# Patient Record
Sex: Female | Born: 1935 | Race: White | Hispanic: No | Smoking: Never smoker
Health system: Southern US, Community
[De-identification: ages and names within clinical notes are randomized; demographics above are authoritative.]

## PROBLEM LIST (undated history)

## (undated) DIAGNOSIS — S069X9A Unspecified intracranial injury with loss of consciousness of unspecified duration, initial encounter: Secondary | ICD-10-CM

## (undated) DIAGNOSIS — I10 Essential (primary) hypertension: Secondary | ICD-10-CM

## (undated) DIAGNOSIS — Z931 Gastrostomy status: Secondary | ICD-10-CM

## (undated) DIAGNOSIS — R601 Generalized edema: Secondary | ICD-10-CM

## (undated) DIAGNOSIS — F419 Anxiety disorder, unspecified: Secondary | ICD-10-CM

## (undated) DIAGNOSIS — R1310 Dysphagia, unspecified: Secondary | ICD-10-CM

## (undated) DIAGNOSIS — E669 Obesity, unspecified: Secondary | ICD-10-CM

## (undated) DIAGNOSIS — M109 Gout, unspecified: Secondary | ICD-10-CM

## (undated) HISTORY — DX: Gastrostomy status: Z93.1

## (undated) HISTORY — DX: Essential (primary) hypertension: I10

## (undated) HISTORY — DX: Anxiety disorder, unspecified: F41.9

## (undated) HISTORY — DX: Dysphagia, unspecified: R13.10

## (undated) HISTORY — DX: Gout, unspecified: M10.9

## (undated) HISTORY — DX: Unspecified intracranial injury with loss of consciousness of unspecified duration, initial encounter: S06.9X9A

## (undated) HISTORY — DX: Obesity, unspecified: E66.9

## (undated) HISTORY — PX: MASTECTOMY: SHX3

## (undated) HISTORY — DX: Generalized edema: R60.1

---

## 2013-08-05 LAB — HEPATIC FUNCTION PANEL
ALT: 9 U/L (ref 7–35)
AST: 16 U/L (ref 13–35)

## 2013-08-06 LAB — TSH: TSH: 1.68 u[IU]/mL (ref 0.41–5.90)

## 2015-02-09 DIAGNOSIS — I10 Essential (primary) hypertension: Secondary | ICD-10-CM | POA: Diagnosis not present

## 2015-02-09 DIAGNOSIS — L309 Dermatitis, unspecified: Secondary | ICD-10-CM | POA: Diagnosis not present

## 2015-02-09 DIAGNOSIS — E790 Hyperuricemia without signs of inflammatory arthritis and tophaceous disease: Secondary | ICD-10-CM | POA: Diagnosis not present

## 2015-03-13 DIAGNOSIS — Z0000 Encounter for general adult medical examination without abnormal findings: Secondary | ICD-10-CM | POA: Diagnosis not present

## 2015-03-13 DIAGNOSIS — R21 Rash and other nonspecific skin eruption: Secondary | ICD-10-CM | POA: Diagnosis not present

## 2015-03-13 DIAGNOSIS — E559 Vitamin D deficiency, unspecified: Secondary | ICD-10-CM | POA: Diagnosis not present

## 2015-03-13 DIAGNOSIS — I10 Essential (primary) hypertension: Secondary | ICD-10-CM | POA: Diagnosis not present

## 2015-03-13 DIAGNOSIS — E782 Mixed hyperlipidemia: Secondary | ICD-10-CM | POA: Diagnosis not present

## 2015-04-04 DIAGNOSIS — E559 Vitamin D deficiency, unspecified: Secondary | ICD-10-CM | POA: Diagnosis not present

## 2015-04-04 DIAGNOSIS — G8929 Other chronic pain: Secondary | ICD-10-CM | POA: Diagnosis not present

## 2015-04-04 DIAGNOSIS — M25561 Pain in right knee: Secondary | ICD-10-CM | POA: Diagnosis not present

## 2015-04-04 DIAGNOSIS — M25562 Pain in left knee: Secondary | ICD-10-CM | POA: Diagnosis not present

## 2015-04-24 DIAGNOSIS — L03115 Cellulitis of right lower limb: Secondary | ICD-10-CM | POA: Diagnosis not present

## 2015-04-24 DIAGNOSIS — I872 Venous insufficiency (chronic) (peripheral): Secondary | ICD-10-CM | POA: Diagnosis present

## 2015-04-24 DIAGNOSIS — M109 Gout, unspecified: Secondary | ICD-10-CM | POA: Diagnosis present

## 2015-04-24 DIAGNOSIS — R509 Fever, unspecified: Secondary | ICD-10-CM | POA: Diagnosis not present

## 2015-04-24 DIAGNOSIS — R442 Other hallucinations: Secondary | ICD-10-CM | POA: Diagnosis not present

## 2015-04-24 DIAGNOSIS — R443 Hallucinations, unspecified: Secondary | ICD-10-CM | POA: Diagnosis not present

## 2015-04-24 DIAGNOSIS — M170 Bilateral primary osteoarthritis of knee: Secondary | ICD-10-CM | POA: Diagnosis present

## 2015-04-24 DIAGNOSIS — E860 Dehydration: Secondary | ICD-10-CM | POA: Diagnosis present

## 2015-04-24 DIAGNOSIS — L03116 Cellulitis of left lower limb: Secondary | ICD-10-CM | POA: Diagnosis not present

## 2015-04-24 DIAGNOSIS — Z6837 Body mass index (BMI) 37.0-37.9, adult: Secondary | ICD-10-CM | POA: Diagnosis not present

## 2015-04-24 DIAGNOSIS — D649 Anemia, unspecified: Secondary | ICD-10-CM | POA: Diagnosis present

## 2015-04-24 DIAGNOSIS — R4182 Altered mental status, unspecified: Secondary | ICD-10-CM | POA: Diagnosis not present

## 2015-04-24 DIAGNOSIS — F29 Unspecified psychosis not due to a substance or known physiological condition: Secondary | ICD-10-CM | POA: Diagnosis not present

## 2015-04-24 DIAGNOSIS — Z87891 Personal history of nicotine dependence: Secondary | ICD-10-CM | POA: Diagnosis not present

## 2015-04-24 DIAGNOSIS — E869 Volume depletion, unspecified: Secondary | ICD-10-CM | POA: Diagnosis present

## 2015-04-24 DIAGNOSIS — E538 Deficiency of other specified B group vitamins: Secondary | ICD-10-CM | POA: Diagnosis present

## 2015-04-24 DIAGNOSIS — R51 Headache: Secondary | ICD-10-CM | POA: Diagnosis not present

## 2015-04-24 DIAGNOSIS — L03119 Cellulitis of unspecified part of limb: Secondary | ICD-10-CM | POA: Diagnosis present

## 2015-04-24 DIAGNOSIS — I10 Essential (primary) hypertension: Secondary | ICD-10-CM | POA: Diagnosis present

## 2015-04-24 DIAGNOSIS — E46 Unspecified protein-calorie malnutrition: Secondary | ICD-10-CM | POA: Diagnosis present

## 2015-04-24 DIAGNOSIS — G9341 Metabolic encephalopathy: Secondary | ICD-10-CM | POA: Diagnosis present

## 2015-04-24 DIAGNOSIS — F23 Brief psychotic disorder: Secondary | ICD-10-CM | POA: Diagnosis present

## 2015-04-24 DIAGNOSIS — I771 Stricture of artery: Secondary | ICD-10-CM | POA: Diagnosis not present

## 2015-05-01 DIAGNOSIS — G9341 Metabolic encephalopathy: Secondary | ICD-10-CM | POA: Diagnosis not present

## 2015-05-01 DIAGNOSIS — D649 Anemia, unspecified: Secondary | ICD-10-CM | POA: Diagnosis not present

## 2015-05-01 DIAGNOSIS — I8310 Varicose veins of unspecified lower extremity with inflammation: Secondary | ICD-10-CM | POA: Diagnosis not present

## 2015-05-01 DIAGNOSIS — E869 Volume depletion, unspecified: Secondary | ICD-10-CM | POA: Diagnosis not present

## 2015-05-01 DIAGNOSIS — I8312 Varicose veins of left lower extremity with inflammation: Secondary | ICD-10-CM | POA: Diagnosis not present

## 2015-05-01 DIAGNOSIS — I959 Hypotension, unspecified: Secondary | ICD-10-CM | POA: Diagnosis not present

## 2015-05-01 DIAGNOSIS — E538 Deficiency of other specified B group vitamins: Secondary | ICD-10-CM | POA: Diagnosis present

## 2015-05-01 DIAGNOSIS — L03116 Cellulitis of left lower limb: Secondary | ICD-10-CM | POA: Diagnosis not present

## 2015-05-01 DIAGNOSIS — E46 Unspecified protein-calorie malnutrition: Secondary | ICD-10-CM | POA: Diagnosis present

## 2015-05-01 DIAGNOSIS — S20211A Contusion of right front wall of thorax, initial encounter: Secondary | ICD-10-CM | POA: Diagnosis present

## 2015-05-01 DIAGNOSIS — R9082 White matter disease, unspecified: Secondary | ICD-10-CM | POA: Diagnosis not present

## 2015-05-01 DIAGNOSIS — R4182 Altered mental status, unspecified: Secondary | ICD-10-CM | POA: Diagnosis not present

## 2015-05-01 DIAGNOSIS — J9811 Atelectasis: Secondary | ICD-10-CM | POA: Diagnosis not present

## 2015-05-01 DIAGNOSIS — F05 Delirium due to known physiological condition: Secondary | ICD-10-CM | POA: Diagnosis present

## 2015-05-01 DIAGNOSIS — Z7401 Bed confinement status: Secondary | ICD-10-CM | POA: Diagnosis not present

## 2015-05-01 DIAGNOSIS — F0281 Dementia in other diseases classified elsewhere with behavioral disturbance: Secondary | ICD-10-CM | POA: Diagnosis not present

## 2015-05-01 DIAGNOSIS — G308 Other Alzheimer's disease: Secondary | ICD-10-CM | POA: Diagnosis not present

## 2015-05-01 DIAGNOSIS — S299XXS Unspecified injury of thorax, sequela: Secondary | ICD-10-CM | POA: Diagnosis not present

## 2015-05-01 DIAGNOSIS — E559 Vitamin D deficiency, unspecified: Secondary | ICD-10-CM | POA: Diagnosis present

## 2015-05-01 DIAGNOSIS — I8311 Varicose veins of right lower extremity with inflammation: Secondary | ICD-10-CM | POA: Diagnosis not present

## 2015-05-01 DIAGNOSIS — R5381 Other malaise: Secondary | ICD-10-CM | POA: Diagnosis present

## 2015-05-01 DIAGNOSIS — D500 Iron deficiency anemia secondary to blood loss (chronic): Secondary | ICD-10-CM | POA: Diagnosis not present

## 2015-05-01 DIAGNOSIS — Z888 Allergy status to other drugs, medicaments and biological substances status: Secondary | ICD-10-CM | POA: Diagnosis not present

## 2015-05-01 DIAGNOSIS — Z87891 Personal history of nicotine dependence: Secondary | ICD-10-CM | POA: Diagnosis not present

## 2015-05-01 DIAGNOSIS — F29 Unspecified psychosis not due to a substance or known physiological condition: Secondary | ICD-10-CM | POA: Diagnosis not present

## 2015-05-01 DIAGNOSIS — Z9181 History of falling: Secondary | ICD-10-CM | POA: Diagnosis not present

## 2015-05-01 DIAGNOSIS — F0391 Unspecified dementia with behavioral disturbance: Secondary | ICD-10-CM | POA: Diagnosis present

## 2015-05-01 DIAGNOSIS — F23 Brief psychotic disorder: Secondary | ICD-10-CM | POA: Diagnosis not present

## 2015-05-01 DIAGNOSIS — J90 Pleural effusion, not elsewhere classified: Secondary | ICD-10-CM | POA: Diagnosis not present

## 2015-05-01 DIAGNOSIS — G319 Degenerative disease of nervous system, unspecified: Secondary | ICD-10-CM | POA: Diagnosis not present

## 2015-05-01 DIAGNOSIS — S299XXA Unspecified injury of thorax, initial encounter: Secondary | ICD-10-CM | POA: Diagnosis not present

## 2015-05-01 DIAGNOSIS — D539 Nutritional anemia, unspecified: Secondary | ICD-10-CM | POA: Diagnosis present

## 2015-05-01 DIAGNOSIS — I872 Venous insufficiency (chronic) (peripheral): Secondary | ICD-10-CM | POA: Diagnosis present

## 2015-05-03 DIAGNOSIS — I10 Essential (primary) hypertension: Secondary | ICD-10-CM | POA: Diagnosis present

## 2015-05-03 DIAGNOSIS — F0391 Unspecified dementia with behavioral disturbance: Secondary | ICD-10-CM | POA: Diagnosis present

## 2015-05-03 DIAGNOSIS — E559 Vitamin D deficiency, unspecified: Secondary | ICD-10-CM | POA: Diagnosis present

## 2015-05-03 DIAGNOSIS — M109 Gout, unspecified: Secondary | ICD-10-CM | POA: Diagnosis present

## 2015-05-03 DIAGNOSIS — D62 Acute posthemorrhagic anemia: Secondary | ICD-10-CM | POA: Diagnosis present

## 2015-05-03 DIAGNOSIS — Z9011 Acquired absence of right breast and nipple: Secondary | ICD-10-CM | POA: Diagnosis not present

## 2015-05-03 DIAGNOSIS — E538 Deficiency of other specified B group vitamins: Secondary | ICD-10-CM | POA: Diagnosis present

## 2015-05-03 DIAGNOSIS — Z87891 Personal history of nicotine dependence: Secondary | ICD-10-CM | POA: Diagnosis not present

## 2015-05-03 DIAGNOSIS — Z79899 Other long term (current) drug therapy: Secondary | ICD-10-CM | POA: Diagnosis not present

## 2015-05-03 DIAGNOSIS — F23 Brief psychotic disorder: Secondary | ICD-10-CM | POA: Diagnosis present

## 2015-05-03 DIAGNOSIS — Z885 Allergy status to narcotic agent status: Secondary | ICD-10-CM | POA: Diagnosis not present

## 2015-05-03 DIAGNOSIS — I9589 Other hypotension: Secondary | ICD-10-CM | POA: Diagnosis present

## 2015-05-03 DIAGNOSIS — M1990 Unspecified osteoarthritis, unspecified site: Secondary | ICD-10-CM | POA: Diagnosis present

## 2015-05-03 DIAGNOSIS — I872 Venous insufficiency (chronic) (peripheral): Secondary | ICD-10-CM | POA: Diagnosis present

## 2015-05-03 DIAGNOSIS — Z853 Personal history of malignant neoplasm of breast: Secondary | ICD-10-CM | POA: Diagnosis not present

## 2015-05-03 DIAGNOSIS — S20211A Contusion of right front wall of thorax, initial encounter: Secondary | ICD-10-CM | POA: Diagnosis present

## 2015-05-04 DIAGNOSIS — Z741 Need for assistance with personal care: Secondary | ICD-10-CM | POA: Diagnosis not present

## 2015-05-04 DIAGNOSIS — I10 Essential (primary) hypertension: Secondary | ICD-10-CM | POA: Diagnosis present

## 2015-05-04 DIAGNOSIS — D500 Iron deficiency anemia secondary to blood loss (chronic): Secondary | ICD-10-CM | POA: Diagnosis present

## 2015-05-04 DIAGNOSIS — Z87891 Personal history of nicotine dependence: Secondary | ICD-10-CM | POA: Diagnosis not present

## 2015-05-04 DIAGNOSIS — F0391 Unspecified dementia with behavioral disturbance: Secondary | ICD-10-CM | POA: Diagnosis not present

## 2015-05-04 DIAGNOSIS — M6281 Muscle weakness (generalized): Secondary | ICD-10-CM | POA: Diagnosis not present

## 2015-05-04 DIAGNOSIS — R41841 Cognitive communication deficit: Secondary | ICD-10-CM | POA: Diagnosis not present

## 2015-05-04 DIAGNOSIS — E538 Deficiency of other specified B group vitamins: Secondary | ICD-10-CM | POA: Diagnosis present

## 2015-05-04 DIAGNOSIS — F05 Delirium due to known physiological condition: Secondary | ICD-10-CM | POA: Diagnosis present

## 2015-05-04 DIAGNOSIS — D649 Anemia, unspecified: Secondary | ICD-10-CM | POA: Diagnosis not present

## 2015-05-04 DIAGNOSIS — D508 Other iron deficiency anemias: Secondary | ICD-10-CM | POA: Diagnosis not present

## 2015-05-04 DIAGNOSIS — Z9011 Acquired absence of right breast and nipple: Secondary | ICD-10-CM | POA: Diagnosis not present

## 2015-05-04 DIAGNOSIS — Z79899 Other long term (current) drug therapy: Secondary | ICD-10-CM | POA: Diagnosis not present

## 2015-05-04 DIAGNOSIS — S2020XA Contusion of thorax, unspecified, initial encounter: Secondary | ICD-10-CM | POA: Diagnosis not present

## 2015-05-04 DIAGNOSIS — R262 Difficulty in walking, not elsewhere classified: Secondary | ICD-10-CM | POA: Diagnosis not present

## 2015-05-04 DIAGNOSIS — N390 Urinary tract infection, site not specified: Secondary | ICD-10-CM | POA: Diagnosis present

## 2015-05-04 DIAGNOSIS — I959 Hypotension, unspecified: Secondary | ICD-10-CM | POA: Diagnosis present

## 2015-05-04 DIAGNOSIS — E46 Unspecified protein-calorie malnutrition: Secondary | ICD-10-CM | POA: Diagnosis not present

## 2015-05-04 DIAGNOSIS — T148 Other injury of unspecified body region: Secondary | ICD-10-CM | POA: Diagnosis not present

## 2015-05-04 DIAGNOSIS — S20211A Contusion of right front wall of thorax, initial encounter: Secondary | ICD-10-CM | POA: Diagnosis present

## 2015-05-04 DIAGNOSIS — E559 Vitamin D deficiency, unspecified: Secondary | ICD-10-CM | POA: Diagnosis present

## 2015-05-04 DIAGNOSIS — I872 Venous insufficiency (chronic) (peripheral): Secondary | ICD-10-CM | POA: Diagnosis not present

## 2015-05-04 DIAGNOSIS — G9341 Metabolic encephalopathy: Secondary | ICD-10-CM | POA: Diagnosis not present

## 2015-05-04 DIAGNOSIS — S20211D Contusion of right front wall of thorax, subsequent encounter: Secondary | ICD-10-CM | POA: Diagnosis not present

## 2015-05-04 DIAGNOSIS — S299XXD Unspecified injury of thorax, subsequent encounter: Secondary | ICD-10-CM | POA: Diagnosis not present

## 2015-05-04 DIAGNOSIS — F23 Brief psychotic disorder: Secondary | ICD-10-CM | POA: Diagnosis present

## 2015-05-04 DIAGNOSIS — S298XXA Other specified injuries of thorax, initial encounter: Secondary | ICD-10-CM | POA: Diagnosis not present

## 2015-05-08 DIAGNOSIS — M170 Bilateral primary osteoarthritis of knee: Secondary | ICD-10-CM | POA: Diagnosis not present

## 2015-05-08 DIAGNOSIS — D649 Anemia, unspecified: Secondary | ICD-10-CM | POA: Diagnosis not present

## 2015-05-08 DIAGNOSIS — S299XXD Unspecified injury of thorax, subsequent encounter: Secondary | ICD-10-CM | POA: Diagnosis not present

## 2015-05-08 DIAGNOSIS — M1711 Unilateral primary osteoarthritis, right knee: Secondary | ICD-10-CM | POA: Diagnosis not present

## 2015-05-08 DIAGNOSIS — F0391 Unspecified dementia with behavioral disturbance: Secondary | ICD-10-CM | POA: Diagnosis not present

## 2015-05-08 DIAGNOSIS — M1712 Unilateral primary osteoarthritis, left knee: Secondary | ICD-10-CM | POA: Diagnosis not present

## 2015-05-08 DIAGNOSIS — Z741 Need for assistance with personal care: Secondary | ICD-10-CM | POA: Diagnosis not present

## 2015-05-08 DIAGNOSIS — T148 Other injury of unspecified body region: Secondary | ICD-10-CM | POA: Diagnosis not present

## 2015-05-08 DIAGNOSIS — E538 Deficiency of other specified B group vitamins: Secondary | ICD-10-CM | POA: Diagnosis not present

## 2015-05-08 DIAGNOSIS — M25562 Pain in left knee: Secondary | ICD-10-CM | POA: Diagnosis not present

## 2015-05-08 DIAGNOSIS — M6281 Muscle weakness (generalized): Secondary | ICD-10-CM | POA: Diagnosis not present

## 2015-05-08 DIAGNOSIS — S298XXA Other specified injuries of thorax, initial encounter: Secondary | ICD-10-CM | POA: Diagnosis not present

## 2015-05-08 DIAGNOSIS — I872 Venous insufficiency (chronic) (peripheral): Secondary | ICD-10-CM | POA: Diagnosis not present

## 2015-05-08 DIAGNOSIS — R262 Difficulty in walking, not elsewhere classified: Secondary | ICD-10-CM | POA: Diagnosis not present

## 2015-05-08 DIAGNOSIS — E46 Unspecified protein-calorie malnutrition: Secondary | ICD-10-CM | POA: Diagnosis not present

## 2015-05-08 DIAGNOSIS — G9341 Metabolic encephalopathy: Secondary | ICD-10-CM | POA: Diagnosis not present

## 2015-05-08 DIAGNOSIS — S20211D Contusion of right front wall of thorax, subsequent encounter: Secondary | ICD-10-CM | POA: Diagnosis not present

## 2015-05-08 DIAGNOSIS — L03818 Cellulitis of other sites: Secondary | ICD-10-CM | POA: Diagnosis not present

## 2015-05-08 DIAGNOSIS — R41841 Cognitive communication deficit: Secondary | ICD-10-CM | POA: Diagnosis not present

## 2015-05-08 DIAGNOSIS — Z79899 Other long term (current) drug therapy: Secondary | ICD-10-CM | POA: Diagnosis not present

## 2015-05-08 DIAGNOSIS — D508 Other iron deficiency anemias: Secondary | ICD-10-CM | POA: Diagnosis not present

## 2015-05-08 DIAGNOSIS — R2231 Localized swelling, mass and lump, right upper limb: Secondary | ICD-10-CM | POA: Diagnosis not present

## 2015-05-08 DIAGNOSIS — E559 Vitamin D deficiency, unspecified: Secondary | ICD-10-CM | POA: Diagnosis not present

## 2015-05-08 DIAGNOSIS — I959 Hypotension, unspecified: Secondary | ICD-10-CM | POA: Diagnosis not present

## 2015-05-08 DIAGNOSIS — L03119 Cellulitis of unspecified part of limb: Secondary | ICD-10-CM | POA: Diagnosis not present

## 2015-05-08 DIAGNOSIS — M25561 Pain in right knee: Secondary | ICD-10-CM | POA: Diagnosis not present

## 2015-05-19 DIAGNOSIS — L03119 Cellulitis of unspecified part of limb: Secondary | ICD-10-CM | POA: Diagnosis not present

## 2015-06-14 DIAGNOSIS — M170 Bilateral primary osteoarthritis of knee: Secondary | ICD-10-CM | POA: Diagnosis not present

## 2015-06-14 DIAGNOSIS — M25561 Pain in right knee: Secondary | ICD-10-CM | POA: Diagnosis not present

## 2015-06-14 DIAGNOSIS — M25562 Pain in left knee: Secondary | ICD-10-CM | POA: Diagnosis not present

## 2015-06-28 DIAGNOSIS — L03818 Cellulitis of other sites: Secondary | ICD-10-CM | POA: Diagnosis not present

## 2015-07-19 DIAGNOSIS — M170 Bilateral primary osteoarthritis of knee: Secondary | ICD-10-CM | POA: Diagnosis not present

## 2015-09-04 DIAGNOSIS — Z87440 Personal history of urinary (tract) infections: Secondary | ICD-10-CM | POA: Diagnosis not present

## 2015-09-04 DIAGNOSIS — I10 Essential (primary) hypertension: Secondary | ICD-10-CM | POA: Diagnosis not present

## 2015-09-04 DIAGNOSIS — D649 Anemia, unspecified: Secondary | ICD-10-CM | POA: Diagnosis not present

## 2015-09-04 DIAGNOSIS — R262 Difficulty in walking, not elsewhere classified: Secondary | ICD-10-CM | POA: Diagnosis not present

## 2015-09-04 DIAGNOSIS — M6281 Muscle weakness (generalized): Secondary | ICD-10-CM | POA: Diagnosis not present

## 2015-09-04 DIAGNOSIS — I872 Venous insufficiency (chronic) (peripheral): Secondary | ICD-10-CM | POA: Diagnosis not present

## 2015-09-04 DIAGNOSIS — E46 Unspecified protein-calorie malnutrition: Secondary | ICD-10-CM | POA: Diagnosis not present

## 2015-09-04 DIAGNOSIS — F0391 Unspecified dementia with behavioral disturbance: Secondary | ICD-10-CM | POA: Diagnosis not present

## 2015-09-06 DIAGNOSIS — Z87440 Personal history of urinary (tract) infections: Secondary | ICD-10-CM | POA: Diagnosis not present

## 2015-09-06 DIAGNOSIS — D649 Anemia, unspecified: Secondary | ICD-10-CM | POA: Diagnosis not present

## 2015-09-06 DIAGNOSIS — M6281 Muscle weakness (generalized): Secondary | ICD-10-CM | POA: Diagnosis not present

## 2015-09-06 DIAGNOSIS — E46 Unspecified protein-calorie malnutrition: Secondary | ICD-10-CM | POA: Diagnosis not present

## 2015-09-06 DIAGNOSIS — F0391 Unspecified dementia with behavioral disturbance: Secondary | ICD-10-CM | POA: Diagnosis not present

## 2015-09-06 DIAGNOSIS — I872 Venous insufficiency (chronic) (peripheral): Secondary | ICD-10-CM | POA: Diagnosis not present

## 2015-09-07 DIAGNOSIS — M6281 Muscle weakness (generalized): Secondary | ICD-10-CM | POA: Diagnosis not present

## 2015-09-07 DIAGNOSIS — F0391 Unspecified dementia with behavioral disturbance: Secondary | ICD-10-CM | POA: Diagnosis not present

## 2015-09-07 DIAGNOSIS — Z87440 Personal history of urinary (tract) infections: Secondary | ICD-10-CM | POA: Diagnosis not present

## 2015-09-07 DIAGNOSIS — D649 Anemia, unspecified: Secondary | ICD-10-CM | POA: Diagnosis not present

## 2015-09-07 DIAGNOSIS — E46 Unspecified protein-calorie malnutrition: Secondary | ICD-10-CM | POA: Diagnosis not present

## 2015-09-07 DIAGNOSIS — I872 Venous insufficiency (chronic) (peripheral): Secondary | ICD-10-CM | POA: Diagnosis not present

## 2015-09-11 DIAGNOSIS — F0391 Unspecified dementia with behavioral disturbance: Secondary | ICD-10-CM | POA: Diagnosis not present

## 2015-09-11 DIAGNOSIS — D649 Anemia, unspecified: Secondary | ICD-10-CM | POA: Diagnosis not present

## 2015-09-11 DIAGNOSIS — I872 Venous insufficiency (chronic) (peripheral): Secondary | ICD-10-CM | POA: Diagnosis not present

## 2015-09-11 DIAGNOSIS — E46 Unspecified protein-calorie malnutrition: Secondary | ICD-10-CM | POA: Diagnosis not present

## 2015-09-11 DIAGNOSIS — Z87440 Personal history of urinary (tract) infections: Secondary | ICD-10-CM | POA: Diagnosis not present

## 2015-09-11 DIAGNOSIS — M6281 Muscle weakness (generalized): Secondary | ICD-10-CM | POA: Diagnosis not present

## 2015-09-12 DIAGNOSIS — F0391 Unspecified dementia with behavioral disturbance: Secondary | ICD-10-CM | POA: Diagnosis not present

## 2015-09-12 DIAGNOSIS — Z87440 Personal history of urinary (tract) infections: Secondary | ICD-10-CM | POA: Diagnosis not present

## 2015-09-12 DIAGNOSIS — M6281 Muscle weakness (generalized): Secondary | ICD-10-CM | POA: Diagnosis not present

## 2015-09-12 DIAGNOSIS — D649 Anemia, unspecified: Secondary | ICD-10-CM | POA: Diagnosis not present

## 2015-09-12 DIAGNOSIS — I872 Venous insufficiency (chronic) (peripheral): Secondary | ICD-10-CM | POA: Diagnosis not present

## 2015-09-12 DIAGNOSIS — E46 Unspecified protein-calorie malnutrition: Secondary | ICD-10-CM | POA: Diagnosis not present

## 2015-09-13 DIAGNOSIS — E46 Unspecified protein-calorie malnutrition: Secondary | ICD-10-CM | POA: Diagnosis not present

## 2015-09-13 DIAGNOSIS — M6281 Muscle weakness (generalized): Secondary | ICD-10-CM | POA: Diagnosis not present

## 2015-09-13 DIAGNOSIS — Z87440 Personal history of urinary (tract) infections: Secondary | ICD-10-CM | POA: Diagnosis not present

## 2015-09-13 DIAGNOSIS — D649 Anemia, unspecified: Secondary | ICD-10-CM | POA: Diagnosis not present

## 2015-09-13 DIAGNOSIS — F0391 Unspecified dementia with behavioral disturbance: Secondary | ICD-10-CM | POA: Diagnosis not present

## 2015-09-13 DIAGNOSIS — I872 Venous insufficiency (chronic) (peripheral): Secondary | ICD-10-CM | POA: Diagnosis not present

## 2015-09-14 DIAGNOSIS — D649 Anemia, unspecified: Secondary | ICD-10-CM | POA: Diagnosis not present

## 2015-09-14 DIAGNOSIS — Z87440 Personal history of urinary (tract) infections: Secondary | ICD-10-CM | POA: Diagnosis not present

## 2015-09-14 DIAGNOSIS — I872 Venous insufficiency (chronic) (peripheral): Secondary | ICD-10-CM | POA: Diagnosis not present

## 2015-09-14 DIAGNOSIS — F0391 Unspecified dementia with behavioral disturbance: Secondary | ICD-10-CM | POA: Diagnosis not present

## 2015-09-14 DIAGNOSIS — M6281 Muscle weakness (generalized): Secondary | ICD-10-CM | POA: Diagnosis not present

## 2015-09-14 DIAGNOSIS — E46 Unspecified protein-calorie malnutrition: Secondary | ICD-10-CM | POA: Diagnosis not present

## 2015-09-18 DIAGNOSIS — E46 Unspecified protein-calorie malnutrition: Secondary | ICD-10-CM | POA: Diagnosis not present

## 2015-09-18 DIAGNOSIS — M6281 Muscle weakness (generalized): Secondary | ICD-10-CM | POA: Diagnosis not present

## 2015-09-18 DIAGNOSIS — Z87440 Personal history of urinary (tract) infections: Secondary | ICD-10-CM | POA: Diagnosis not present

## 2015-09-18 DIAGNOSIS — F0391 Unspecified dementia with behavioral disturbance: Secondary | ICD-10-CM | POA: Diagnosis not present

## 2015-09-18 DIAGNOSIS — I872 Venous insufficiency (chronic) (peripheral): Secondary | ICD-10-CM | POA: Diagnosis not present

## 2015-09-18 DIAGNOSIS — D649 Anemia, unspecified: Secondary | ICD-10-CM | POA: Diagnosis not present

## 2015-09-20 DIAGNOSIS — F0391 Unspecified dementia with behavioral disturbance: Secondary | ICD-10-CM | POA: Diagnosis not present

## 2015-09-20 DIAGNOSIS — Z87440 Personal history of urinary (tract) infections: Secondary | ICD-10-CM | POA: Diagnosis not present

## 2015-09-20 DIAGNOSIS — M6281 Muscle weakness (generalized): Secondary | ICD-10-CM | POA: Diagnosis not present

## 2015-09-20 DIAGNOSIS — E46 Unspecified protein-calorie malnutrition: Secondary | ICD-10-CM | POA: Diagnosis not present

## 2015-09-20 DIAGNOSIS — I872 Venous insufficiency (chronic) (peripheral): Secondary | ICD-10-CM | POA: Diagnosis not present

## 2015-09-20 DIAGNOSIS — D649 Anemia, unspecified: Secondary | ICD-10-CM | POA: Diagnosis not present

## 2015-09-21 DIAGNOSIS — Z87440 Personal history of urinary (tract) infections: Secondary | ICD-10-CM | POA: Diagnosis not present

## 2015-09-21 DIAGNOSIS — E46 Unspecified protein-calorie malnutrition: Secondary | ICD-10-CM | POA: Diagnosis not present

## 2015-09-21 DIAGNOSIS — F0391 Unspecified dementia with behavioral disturbance: Secondary | ICD-10-CM | POA: Diagnosis not present

## 2015-09-21 DIAGNOSIS — M6281 Muscle weakness (generalized): Secondary | ICD-10-CM | POA: Diagnosis not present

## 2015-09-21 DIAGNOSIS — I872 Venous insufficiency (chronic) (peripheral): Secondary | ICD-10-CM | POA: Diagnosis not present

## 2015-09-21 DIAGNOSIS — D649 Anemia, unspecified: Secondary | ICD-10-CM | POA: Diagnosis not present

## 2015-09-22 DIAGNOSIS — Z87440 Personal history of urinary (tract) infections: Secondary | ICD-10-CM | POA: Diagnosis not present

## 2015-09-22 DIAGNOSIS — F0391 Unspecified dementia with behavioral disturbance: Secondary | ICD-10-CM | POA: Diagnosis not present

## 2015-09-22 DIAGNOSIS — D649 Anemia, unspecified: Secondary | ICD-10-CM | POA: Diagnosis not present

## 2015-09-22 DIAGNOSIS — M6281 Muscle weakness (generalized): Secondary | ICD-10-CM | POA: Diagnosis not present

## 2015-09-22 DIAGNOSIS — E46 Unspecified protein-calorie malnutrition: Secondary | ICD-10-CM | POA: Diagnosis not present

## 2015-09-22 DIAGNOSIS — I872 Venous insufficiency (chronic) (peripheral): Secondary | ICD-10-CM | POA: Diagnosis not present

## 2015-09-23 DIAGNOSIS — Z87440 Personal history of urinary (tract) infections: Secondary | ICD-10-CM | POA: Diagnosis not present

## 2015-09-23 DIAGNOSIS — E46 Unspecified protein-calorie malnutrition: Secondary | ICD-10-CM | POA: Diagnosis not present

## 2015-09-23 DIAGNOSIS — D649 Anemia, unspecified: Secondary | ICD-10-CM | POA: Diagnosis not present

## 2015-09-23 DIAGNOSIS — F0391 Unspecified dementia with behavioral disturbance: Secondary | ICD-10-CM | POA: Diagnosis not present

## 2015-09-23 DIAGNOSIS — M6281 Muscle weakness (generalized): Secondary | ICD-10-CM | POA: Diagnosis not present

## 2015-09-23 DIAGNOSIS — I872 Venous insufficiency (chronic) (peripheral): Secondary | ICD-10-CM | POA: Diagnosis not present

## 2015-09-25 DIAGNOSIS — F0391 Unspecified dementia with behavioral disturbance: Secondary | ICD-10-CM | POA: Diagnosis not present

## 2015-09-25 DIAGNOSIS — M6281 Muscle weakness (generalized): Secondary | ICD-10-CM | POA: Diagnosis not present

## 2015-09-25 DIAGNOSIS — E46 Unspecified protein-calorie malnutrition: Secondary | ICD-10-CM | POA: Diagnosis not present

## 2015-09-25 DIAGNOSIS — I872 Venous insufficiency (chronic) (peripheral): Secondary | ICD-10-CM | POA: Diagnosis not present

## 2015-09-25 DIAGNOSIS — D649 Anemia, unspecified: Secondary | ICD-10-CM | POA: Diagnosis not present

## 2015-09-25 DIAGNOSIS — Z87440 Personal history of urinary (tract) infections: Secondary | ICD-10-CM | POA: Diagnosis not present

## 2015-09-26 DIAGNOSIS — I872 Venous insufficiency (chronic) (peripheral): Secondary | ICD-10-CM | POA: Diagnosis not present

## 2015-09-26 DIAGNOSIS — Z87440 Personal history of urinary (tract) infections: Secondary | ICD-10-CM | POA: Diagnosis not present

## 2015-09-26 DIAGNOSIS — E46 Unspecified protein-calorie malnutrition: Secondary | ICD-10-CM | POA: Diagnosis not present

## 2015-09-26 DIAGNOSIS — M6281 Muscle weakness (generalized): Secondary | ICD-10-CM | POA: Diagnosis not present

## 2015-09-26 DIAGNOSIS — D649 Anemia, unspecified: Secondary | ICD-10-CM | POA: Diagnosis not present

## 2015-09-26 DIAGNOSIS — F0391 Unspecified dementia with behavioral disturbance: Secondary | ICD-10-CM | POA: Diagnosis not present

## 2015-09-27 DIAGNOSIS — I872 Venous insufficiency (chronic) (peripheral): Secondary | ICD-10-CM | POA: Diagnosis not present

## 2015-09-27 DIAGNOSIS — D649 Anemia, unspecified: Secondary | ICD-10-CM | POA: Diagnosis not present

## 2015-09-27 DIAGNOSIS — E46 Unspecified protein-calorie malnutrition: Secondary | ICD-10-CM | POA: Diagnosis not present

## 2015-09-27 DIAGNOSIS — F0391 Unspecified dementia with behavioral disturbance: Secondary | ICD-10-CM | POA: Diagnosis not present

## 2015-09-27 DIAGNOSIS — M6281 Muscle weakness (generalized): Secondary | ICD-10-CM | POA: Diagnosis not present

## 2015-09-27 DIAGNOSIS — Z87440 Personal history of urinary (tract) infections: Secondary | ICD-10-CM | POA: Diagnosis not present

## 2015-10-02 DIAGNOSIS — F0391 Unspecified dementia with behavioral disturbance: Secondary | ICD-10-CM | POA: Diagnosis not present

## 2015-10-02 DIAGNOSIS — M6281 Muscle weakness (generalized): Secondary | ICD-10-CM | POA: Diagnosis not present

## 2015-10-02 DIAGNOSIS — D649 Anemia, unspecified: Secondary | ICD-10-CM | POA: Diagnosis not present

## 2015-10-02 DIAGNOSIS — Z87440 Personal history of urinary (tract) infections: Secondary | ICD-10-CM | POA: Diagnosis not present

## 2015-10-02 DIAGNOSIS — I872 Venous insufficiency (chronic) (peripheral): Secondary | ICD-10-CM | POA: Diagnosis not present

## 2015-10-02 DIAGNOSIS — E46 Unspecified protein-calorie malnutrition: Secondary | ICD-10-CM | POA: Diagnosis not present

## 2015-10-03 DIAGNOSIS — D649 Anemia, unspecified: Secondary | ICD-10-CM | POA: Diagnosis not present

## 2015-10-03 DIAGNOSIS — Z87440 Personal history of urinary (tract) infections: Secondary | ICD-10-CM | POA: Diagnosis not present

## 2015-10-03 DIAGNOSIS — M6281 Muscle weakness (generalized): Secondary | ICD-10-CM | POA: Diagnosis not present

## 2015-10-03 DIAGNOSIS — F0391 Unspecified dementia with behavioral disturbance: Secondary | ICD-10-CM | POA: Diagnosis not present

## 2015-10-03 DIAGNOSIS — I872 Venous insufficiency (chronic) (peripheral): Secondary | ICD-10-CM | POA: Diagnosis not present

## 2015-10-03 DIAGNOSIS — E46 Unspecified protein-calorie malnutrition: Secondary | ICD-10-CM | POA: Diagnosis not present

## 2015-10-04 DIAGNOSIS — F0391 Unspecified dementia with behavioral disturbance: Secondary | ICD-10-CM | POA: Diagnosis not present

## 2015-10-04 DIAGNOSIS — D649 Anemia, unspecified: Secondary | ICD-10-CM | POA: Diagnosis not present

## 2015-10-04 DIAGNOSIS — T148 Other injury of unspecified body region: Secondary | ICD-10-CM | POA: Diagnosis not present

## 2015-10-04 DIAGNOSIS — E46 Unspecified protein-calorie malnutrition: Secondary | ICD-10-CM | POA: Diagnosis not present

## 2015-10-04 DIAGNOSIS — I10 Essential (primary) hypertension: Secondary | ICD-10-CM | POA: Diagnosis not present

## 2015-10-04 DIAGNOSIS — Z87440 Personal history of urinary (tract) infections: Secondary | ICD-10-CM | POA: Diagnosis not present

## 2015-10-04 DIAGNOSIS — I872 Venous insufficiency (chronic) (peripheral): Secondary | ICD-10-CM | POA: Diagnosis not present

## 2015-10-04 DIAGNOSIS — M6281 Muscle weakness (generalized): Secondary | ICD-10-CM | POA: Diagnosis not present

## 2015-10-04 DIAGNOSIS — M1990 Unspecified osteoarthritis, unspecified site: Secondary | ICD-10-CM | POA: Diagnosis not present

## 2015-10-05 DIAGNOSIS — E46 Unspecified protein-calorie malnutrition: Secondary | ICD-10-CM | POA: Diagnosis not present

## 2015-10-05 DIAGNOSIS — M6281 Muscle weakness (generalized): Secondary | ICD-10-CM | POA: Diagnosis not present

## 2015-10-05 DIAGNOSIS — Z87440 Personal history of urinary (tract) infections: Secondary | ICD-10-CM | POA: Diagnosis not present

## 2015-10-05 DIAGNOSIS — D649 Anemia, unspecified: Secondary | ICD-10-CM | POA: Diagnosis not present

## 2015-10-05 DIAGNOSIS — F0391 Unspecified dementia with behavioral disturbance: Secondary | ICD-10-CM | POA: Diagnosis not present

## 2015-10-05 DIAGNOSIS — I872 Venous insufficiency (chronic) (peripheral): Secondary | ICD-10-CM | POA: Diagnosis not present

## 2015-10-11 DIAGNOSIS — M6281 Muscle weakness (generalized): Secondary | ICD-10-CM | POA: Diagnosis not present

## 2015-10-11 DIAGNOSIS — F0391 Unspecified dementia with behavioral disturbance: Secondary | ICD-10-CM | POA: Diagnosis not present

## 2015-10-11 DIAGNOSIS — E46 Unspecified protein-calorie malnutrition: Secondary | ICD-10-CM | POA: Diagnosis not present

## 2015-10-11 DIAGNOSIS — I872 Venous insufficiency (chronic) (peripheral): Secondary | ICD-10-CM | POA: Diagnosis not present

## 2015-10-11 DIAGNOSIS — Z87440 Personal history of urinary (tract) infections: Secondary | ICD-10-CM | POA: Diagnosis not present

## 2015-10-11 DIAGNOSIS — D649 Anemia, unspecified: Secondary | ICD-10-CM | POA: Diagnosis not present

## 2015-10-13 DIAGNOSIS — Z87440 Personal history of urinary (tract) infections: Secondary | ICD-10-CM | POA: Diagnosis not present

## 2015-10-13 DIAGNOSIS — M6281 Muscle weakness (generalized): Secondary | ICD-10-CM | POA: Diagnosis not present

## 2015-10-13 DIAGNOSIS — I872 Venous insufficiency (chronic) (peripheral): Secondary | ICD-10-CM | POA: Diagnosis not present

## 2015-10-13 DIAGNOSIS — E46 Unspecified protein-calorie malnutrition: Secondary | ICD-10-CM | POA: Diagnosis not present

## 2015-10-13 DIAGNOSIS — D649 Anemia, unspecified: Secondary | ICD-10-CM | POA: Diagnosis not present

## 2015-10-13 DIAGNOSIS — F0391 Unspecified dementia with behavioral disturbance: Secondary | ICD-10-CM | POA: Diagnosis not present

## 2015-10-17 DIAGNOSIS — E46 Unspecified protein-calorie malnutrition: Secondary | ICD-10-CM | POA: Diagnosis not present

## 2015-10-17 DIAGNOSIS — Z87440 Personal history of urinary (tract) infections: Secondary | ICD-10-CM | POA: Diagnosis not present

## 2015-10-17 DIAGNOSIS — I872 Venous insufficiency (chronic) (peripheral): Secondary | ICD-10-CM | POA: Diagnosis not present

## 2015-10-17 DIAGNOSIS — D649 Anemia, unspecified: Secondary | ICD-10-CM | POA: Diagnosis not present

## 2015-10-17 DIAGNOSIS — M6281 Muscle weakness (generalized): Secondary | ICD-10-CM | POA: Diagnosis not present

## 2015-10-17 DIAGNOSIS — F0391 Unspecified dementia with behavioral disturbance: Secondary | ICD-10-CM | POA: Diagnosis not present

## 2015-10-19 DIAGNOSIS — E46 Unspecified protein-calorie malnutrition: Secondary | ICD-10-CM | POA: Diagnosis not present

## 2015-10-19 DIAGNOSIS — D649 Anemia, unspecified: Secondary | ICD-10-CM | POA: Diagnosis not present

## 2015-10-19 DIAGNOSIS — F0391 Unspecified dementia with behavioral disturbance: Secondary | ICD-10-CM | POA: Diagnosis not present

## 2015-10-19 DIAGNOSIS — Z87440 Personal history of urinary (tract) infections: Secondary | ICD-10-CM | POA: Diagnosis not present

## 2015-10-19 DIAGNOSIS — I872 Venous insufficiency (chronic) (peripheral): Secondary | ICD-10-CM | POA: Diagnosis not present

## 2015-10-19 DIAGNOSIS — M6281 Muscle weakness (generalized): Secondary | ICD-10-CM | POA: Diagnosis not present

## 2015-10-23 DIAGNOSIS — I872 Venous insufficiency (chronic) (peripheral): Secondary | ICD-10-CM | POA: Diagnosis not present

## 2015-10-23 DIAGNOSIS — M6281 Muscle weakness (generalized): Secondary | ICD-10-CM | POA: Diagnosis not present

## 2015-10-23 DIAGNOSIS — Z87440 Personal history of urinary (tract) infections: Secondary | ICD-10-CM | POA: Diagnosis not present

## 2015-10-23 DIAGNOSIS — D649 Anemia, unspecified: Secondary | ICD-10-CM | POA: Diagnosis not present

## 2015-10-23 DIAGNOSIS — F0391 Unspecified dementia with behavioral disturbance: Secondary | ICD-10-CM | POA: Diagnosis not present

## 2015-10-23 DIAGNOSIS — E46 Unspecified protein-calorie malnutrition: Secondary | ICD-10-CM | POA: Diagnosis not present

## 2015-11-23 DIAGNOSIS — M1990 Unspecified osteoarthritis, unspecified site: Secondary | ICD-10-CM | POA: Diagnosis not present

## 2015-11-23 DIAGNOSIS — M6281 Muscle weakness (generalized): Secondary | ICD-10-CM | POA: Diagnosis not present

## 2015-11-23 DIAGNOSIS — I10 Essential (primary) hypertension: Secondary | ICD-10-CM | POA: Diagnosis not present

## 2015-11-23 DIAGNOSIS — G894 Chronic pain syndrome: Secondary | ICD-10-CM | POA: Diagnosis not present

## 2015-12-22 DIAGNOSIS — M1990 Unspecified osteoarthritis, unspecified site: Secondary | ICD-10-CM | POA: Diagnosis not present

## 2015-12-22 DIAGNOSIS — G894 Chronic pain syndrome: Secondary | ICD-10-CM | POA: Diagnosis not present

## 2015-12-22 DIAGNOSIS — I10 Essential (primary) hypertension: Secondary | ICD-10-CM | POA: Diagnosis not present

## 2015-12-22 DIAGNOSIS — M6281 Muscle weakness (generalized): Secondary | ICD-10-CM | POA: Diagnosis not present

## 2016-01-09 DIAGNOSIS — M170 Bilateral primary osteoarthritis of knee: Secondary | ICD-10-CM | POA: Diagnosis not present

## 2016-01-09 DIAGNOSIS — M6281 Muscle weakness (generalized): Secondary | ICD-10-CM | POA: Diagnosis not present

## 2016-01-09 DIAGNOSIS — G894 Chronic pain syndrome: Secondary | ICD-10-CM | POA: Diagnosis not present

## 2016-01-09 DIAGNOSIS — I10 Essential (primary) hypertension: Secondary | ICD-10-CM | POA: Diagnosis not present

## 2016-01-09 DIAGNOSIS — G9341 Metabolic encephalopathy: Secondary | ICD-10-CM | POA: Diagnosis not present

## 2016-01-09 DIAGNOSIS — M109 Gout, unspecified: Secondary | ICD-10-CM | POA: Diagnosis not present

## 2016-01-09 DIAGNOSIS — E559 Vitamin D deficiency, unspecified: Secondary | ICD-10-CM | POA: Diagnosis not present

## 2016-01-10 DIAGNOSIS — M170 Bilateral primary osteoarthritis of knee: Secondary | ICD-10-CM | POA: Diagnosis not present

## 2016-01-10 DIAGNOSIS — G894 Chronic pain syndrome: Secondary | ICD-10-CM | POA: Diagnosis not present

## 2016-01-10 DIAGNOSIS — I10 Essential (primary) hypertension: Secondary | ICD-10-CM | POA: Diagnosis not present

## 2016-01-10 DIAGNOSIS — G9341 Metabolic encephalopathy: Secondary | ICD-10-CM | POA: Diagnosis not present

## 2016-01-10 DIAGNOSIS — M6281 Muscle weakness (generalized): Secondary | ICD-10-CM | POA: Diagnosis not present

## 2016-01-10 DIAGNOSIS — M109 Gout, unspecified: Secondary | ICD-10-CM | POA: Diagnosis not present

## 2016-01-18 DIAGNOSIS — M109 Gout, unspecified: Secondary | ICD-10-CM | POA: Diagnosis not present

## 2016-01-18 DIAGNOSIS — M6281 Muscle weakness (generalized): Secondary | ICD-10-CM | POA: Diagnosis not present

## 2016-01-18 DIAGNOSIS — G9341 Metabolic encephalopathy: Secondary | ICD-10-CM | POA: Diagnosis not present

## 2016-01-18 DIAGNOSIS — I10 Essential (primary) hypertension: Secondary | ICD-10-CM | POA: Diagnosis not present

## 2016-01-18 DIAGNOSIS — M170 Bilateral primary osteoarthritis of knee: Secondary | ICD-10-CM | POA: Diagnosis not present

## 2016-01-18 DIAGNOSIS — G894 Chronic pain syndrome: Secondary | ICD-10-CM | POA: Diagnosis not present

## 2016-01-19 DIAGNOSIS — G9341 Metabolic encephalopathy: Secondary | ICD-10-CM | POA: Diagnosis not present

## 2016-01-19 DIAGNOSIS — M6281 Muscle weakness (generalized): Secondary | ICD-10-CM | POA: Diagnosis not present

## 2016-01-19 DIAGNOSIS — M170 Bilateral primary osteoarthritis of knee: Secondary | ICD-10-CM | POA: Diagnosis not present

## 2016-01-19 DIAGNOSIS — M109 Gout, unspecified: Secondary | ICD-10-CM | POA: Diagnosis not present

## 2016-01-19 DIAGNOSIS — I10 Essential (primary) hypertension: Secondary | ICD-10-CM | POA: Diagnosis not present

## 2016-01-19 DIAGNOSIS — G894 Chronic pain syndrome: Secondary | ICD-10-CM | POA: Diagnosis not present

## 2016-01-19 DIAGNOSIS — M1990 Unspecified osteoarthritis, unspecified site: Secondary | ICD-10-CM | POA: Diagnosis not present

## 2016-01-22 DIAGNOSIS — I10 Essential (primary) hypertension: Secondary | ICD-10-CM | POA: Diagnosis not present

## 2016-01-22 DIAGNOSIS — M109 Gout, unspecified: Secondary | ICD-10-CM | POA: Diagnosis not present

## 2016-01-22 DIAGNOSIS — M6281 Muscle weakness (generalized): Secondary | ICD-10-CM | POA: Diagnosis not present

## 2016-01-22 DIAGNOSIS — G9341 Metabolic encephalopathy: Secondary | ICD-10-CM | POA: Diagnosis not present

## 2016-01-22 DIAGNOSIS — G894 Chronic pain syndrome: Secondary | ICD-10-CM | POA: Diagnosis not present

## 2016-01-22 DIAGNOSIS — M170 Bilateral primary osteoarthritis of knee: Secondary | ICD-10-CM | POA: Diagnosis not present

## 2016-01-24 DIAGNOSIS — M170 Bilateral primary osteoarthritis of knee: Secondary | ICD-10-CM | POA: Diagnosis not present

## 2016-01-24 DIAGNOSIS — G894 Chronic pain syndrome: Secondary | ICD-10-CM | POA: Diagnosis not present

## 2016-01-24 DIAGNOSIS — M109 Gout, unspecified: Secondary | ICD-10-CM | POA: Diagnosis not present

## 2016-01-24 DIAGNOSIS — I10 Essential (primary) hypertension: Secondary | ICD-10-CM | POA: Diagnosis not present

## 2016-01-24 DIAGNOSIS — G9341 Metabolic encephalopathy: Secondary | ICD-10-CM | POA: Diagnosis not present

## 2016-01-24 DIAGNOSIS — M6281 Muscle weakness (generalized): Secondary | ICD-10-CM | POA: Diagnosis not present

## 2016-01-25 DIAGNOSIS — I10 Essential (primary) hypertension: Secondary | ICD-10-CM | POA: Diagnosis not present

## 2016-01-25 DIAGNOSIS — M170 Bilateral primary osteoarthritis of knee: Secondary | ICD-10-CM | POA: Diagnosis not present

## 2016-01-25 DIAGNOSIS — M109 Gout, unspecified: Secondary | ICD-10-CM | POA: Diagnosis not present

## 2016-01-25 DIAGNOSIS — M6281 Muscle weakness (generalized): Secondary | ICD-10-CM | POA: Diagnosis not present

## 2016-01-25 DIAGNOSIS — G9341 Metabolic encephalopathy: Secondary | ICD-10-CM | POA: Diagnosis not present

## 2016-01-25 DIAGNOSIS — G894 Chronic pain syndrome: Secondary | ICD-10-CM | POA: Diagnosis not present

## 2016-02-01 DIAGNOSIS — G9341 Metabolic encephalopathy: Secondary | ICD-10-CM | POA: Diagnosis not present

## 2016-02-01 DIAGNOSIS — I10 Essential (primary) hypertension: Secondary | ICD-10-CM | POA: Diagnosis not present

## 2016-02-01 DIAGNOSIS — M6281 Muscle weakness (generalized): Secondary | ICD-10-CM | POA: Diagnosis not present

## 2016-02-01 DIAGNOSIS — M170 Bilateral primary osteoarthritis of knee: Secondary | ICD-10-CM | POA: Diagnosis not present

## 2016-02-01 DIAGNOSIS — M109 Gout, unspecified: Secondary | ICD-10-CM | POA: Diagnosis not present

## 2016-02-01 DIAGNOSIS — G894 Chronic pain syndrome: Secondary | ICD-10-CM | POA: Diagnosis not present

## 2016-02-02 DIAGNOSIS — M109 Gout, unspecified: Secondary | ICD-10-CM | POA: Diagnosis not present

## 2016-02-02 DIAGNOSIS — I10 Essential (primary) hypertension: Secondary | ICD-10-CM | POA: Diagnosis not present

## 2016-02-02 DIAGNOSIS — G894 Chronic pain syndrome: Secondary | ICD-10-CM | POA: Diagnosis not present

## 2016-02-02 DIAGNOSIS — M6281 Muscle weakness (generalized): Secondary | ICD-10-CM | POA: Diagnosis not present

## 2016-02-02 DIAGNOSIS — G9341 Metabolic encephalopathy: Secondary | ICD-10-CM | POA: Diagnosis not present

## 2016-02-02 DIAGNOSIS — M170 Bilateral primary osteoarthritis of knee: Secondary | ICD-10-CM | POA: Diagnosis not present

## 2016-02-03 DIAGNOSIS — G9341 Metabolic encephalopathy: Secondary | ICD-10-CM | POA: Diagnosis not present

## 2016-02-03 DIAGNOSIS — G894 Chronic pain syndrome: Secondary | ICD-10-CM | POA: Diagnosis not present

## 2016-02-03 DIAGNOSIS — I10 Essential (primary) hypertension: Secondary | ICD-10-CM | POA: Diagnosis not present

## 2016-02-03 DIAGNOSIS — M109 Gout, unspecified: Secondary | ICD-10-CM | POA: Diagnosis not present

## 2016-02-03 DIAGNOSIS — M170 Bilateral primary osteoarthritis of knee: Secondary | ICD-10-CM | POA: Diagnosis not present

## 2016-02-03 DIAGNOSIS — M6281 Muscle weakness (generalized): Secondary | ICD-10-CM | POA: Diagnosis not present

## 2016-02-06 DIAGNOSIS — I10 Essential (primary) hypertension: Secondary | ICD-10-CM | POA: Diagnosis not present

## 2016-02-06 DIAGNOSIS — M6281 Muscle weakness (generalized): Secondary | ICD-10-CM | POA: Diagnosis not present

## 2016-02-06 DIAGNOSIS — G9341 Metabolic encephalopathy: Secondary | ICD-10-CM | POA: Diagnosis not present

## 2016-02-06 DIAGNOSIS — G894 Chronic pain syndrome: Secondary | ICD-10-CM | POA: Diagnosis not present

## 2016-02-06 DIAGNOSIS — M170 Bilateral primary osteoarthritis of knee: Secondary | ICD-10-CM | POA: Diagnosis not present

## 2016-02-06 DIAGNOSIS — M109 Gout, unspecified: Secondary | ICD-10-CM | POA: Diagnosis not present

## 2016-02-07 DIAGNOSIS — M6281 Muscle weakness (generalized): Secondary | ICD-10-CM | POA: Diagnosis not present

## 2016-02-07 DIAGNOSIS — M170 Bilateral primary osteoarthritis of knee: Secondary | ICD-10-CM | POA: Diagnosis not present

## 2016-02-07 DIAGNOSIS — I10 Essential (primary) hypertension: Secondary | ICD-10-CM | POA: Diagnosis not present

## 2016-02-07 DIAGNOSIS — M109 Gout, unspecified: Secondary | ICD-10-CM | POA: Diagnosis not present

## 2016-02-07 DIAGNOSIS — G894 Chronic pain syndrome: Secondary | ICD-10-CM | POA: Diagnosis not present

## 2016-02-07 DIAGNOSIS — G9341 Metabolic encephalopathy: Secondary | ICD-10-CM | POA: Diagnosis not present

## 2016-02-09 DIAGNOSIS — I10 Essential (primary) hypertension: Secondary | ICD-10-CM | POA: Diagnosis not present

## 2016-02-09 DIAGNOSIS — G9341 Metabolic encephalopathy: Secondary | ICD-10-CM | POA: Diagnosis not present

## 2016-02-09 DIAGNOSIS — M170 Bilateral primary osteoarthritis of knee: Secondary | ICD-10-CM | POA: Diagnosis not present

## 2016-02-09 DIAGNOSIS — M6281 Muscle weakness (generalized): Secondary | ICD-10-CM | POA: Diagnosis not present

## 2016-02-09 DIAGNOSIS — G894 Chronic pain syndrome: Secondary | ICD-10-CM | POA: Diagnosis not present

## 2016-02-09 DIAGNOSIS — M109 Gout, unspecified: Secondary | ICD-10-CM | POA: Diagnosis not present

## 2016-02-12 DIAGNOSIS — M170 Bilateral primary osteoarthritis of knee: Secondary | ICD-10-CM | POA: Diagnosis not present

## 2016-02-12 DIAGNOSIS — I10 Essential (primary) hypertension: Secondary | ICD-10-CM | POA: Diagnosis not present

## 2016-02-12 DIAGNOSIS — G894 Chronic pain syndrome: Secondary | ICD-10-CM | POA: Diagnosis not present

## 2016-02-12 DIAGNOSIS — M6281 Muscle weakness (generalized): Secondary | ICD-10-CM | POA: Diagnosis not present

## 2016-02-12 DIAGNOSIS — G9341 Metabolic encephalopathy: Secondary | ICD-10-CM | POA: Diagnosis not present

## 2016-02-12 DIAGNOSIS — M109 Gout, unspecified: Secondary | ICD-10-CM | POA: Diagnosis not present

## 2016-02-14 DIAGNOSIS — G894 Chronic pain syndrome: Secondary | ICD-10-CM | POA: Diagnosis not present

## 2016-02-14 DIAGNOSIS — M170 Bilateral primary osteoarthritis of knee: Secondary | ICD-10-CM | POA: Diagnosis not present

## 2016-02-14 DIAGNOSIS — G9341 Metabolic encephalopathy: Secondary | ICD-10-CM | POA: Diagnosis not present

## 2016-02-14 DIAGNOSIS — M6281 Muscle weakness (generalized): Secondary | ICD-10-CM | POA: Diagnosis not present

## 2016-02-14 DIAGNOSIS — M109 Gout, unspecified: Secondary | ICD-10-CM | POA: Diagnosis not present

## 2016-02-14 DIAGNOSIS — M1990 Unspecified osteoarthritis, unspecified site: Secondary | ICD-10-CM | POA: Diagnosis not present

## 2016-02-14 DIAGNOSIS — I10 Essential (primary) hypertension: Secondary | ICD-10-CM | POA: Diagnosis not present

## 2016-03-30 DIAGNOSIS — I10 Essential (primary) hypertension: Secondary | ICD-10-CM | POA: Diagnosis not present

## 2016-03-30 DIAGNOSIS — M6281 Muscle weakness (generalized): Secondary | ICD-10-CM | POA: Diagnosis not present

## 2016-03-30 DIAGNOSIS — G894 Chronic pain syndrome: Secondary | ICD-10-CM | POA: Diagnosis not present

## 2016-03-30 DIAGNOSIS — M7989 Other specified soft tissue disorders: Secondary | ICD-10-CM | POA: Diagnosis not present

## 2016-03-31 DIAGNOSIS — R9431 Abnormal electrocardiogram [ECG] [EKG]: Secondary | ICD-10-CM | POA: Diagnosis not present

## 2016-03-31 DIAGNOSIS — B9620 Unspecified Escherichia coli [E. coli] as the cause of diseases classified elsewhere: Secondary | ICD-10-CM | POA: Diagnosis present

## 2016-03-31 DIAGNOSIS — I611 Nontraumatic intracerebral hemorrhage in hemisphere, cortical: Secondary | ICD-10-CM | POA: Diagnosis not present

## 2016-03-31 DIAGNOSIS — N390 Urinary tract infection, site not specified: Secondary | ICD-10-CM | POA: Diagnosis not present

## 2016-03-31 DIAGNOSIS — R278 Other lack of coordination: Secondary | ICD-10-CM | POA: Diagnosis not present

## 2016-03-31 DIAGNOSIS — S01441A Puncture wound with foreign body of right cheek and temporomandibular area, initial encounter: Secondary | ICD-10-CM | POA: Diagnosis not present

## 2016-03-31 DIAGNOSIS — S0993XA Unspecified injury of face, initial encounter: Secondary | ICD-10-CM | POA: Diagnosis not present

## 2016-03-31 DIAGNOSIS — R52 Pain, unspecified: Secondary | ICD-10-CM | POA: Diagnosis not present

## 2016-03-31 DIAGNOSIS — R001 Bradycardia, unspecified: Secondary | ICD-10-CM | POA: Diagnosis not present

## 2016-03-31 DIAGNOSIS — L89321 Pressure ulcer of left buttock, stage 1: Secondary | ICD-10-CM | POA: Diagnosis present

## 2016-03-31 DIAGNOSIS — E876 Hypokalemia: Secondary | ICD-10-CM | POA: Diagnosis not present

## 2016-03-31 DIAGNOSIS — R488 Other symbolic dysfunctions: Secondary | ICD-10-CM | POA: Diagnosis not present

## 2016-03-31 DIAGNOSIS — R6521 Severe sepsis with septic shock: Secondary | ICD-10-CM | POA: Diagnosis not present

## 2016-03-31 DIAGNOSIS — S0181XA Laceration without foreign body of other part of head, initial encounter: Secondary | ICD-10-CM | POA: Diagnosis present

## 2016-03-31 DIAGNOSIS — G936 Cerebral edema: Secondary | ICD-10-CM | POA: Diagnosis not present

## 2016-03-31 DIAGNOSIS — I619 Nontraumatic intracerebral hemorrhage, unspecified: Secondary | ICD-10-CM | POA: Diagnosis not present

## 2016-03-31 DIAGNOSIS — M109 Gout, unspecified: Secondary | ICD-10-CM | POA: Diagnosis not present

## 2016-03-31 DIAGNOSIS — M7989 Other specified soft tissue disorders: Secondary | ICD-10-CM | POA: Diagnosis not present

## 2016-03-31 DIAGNOSIS — W19XXXA Unspecified fall, initial encounter: Secondary | ICD-10-CM | POA: Diagnosis not present

## 2016-03-31 DIAGNOSIS — I9589 Other hypotension: Secondary | ICD-10-CM | POA: Diagnosis not present

## 2016-03-31 DIAGNOSIS — S06341A Traumatic hemorrhage of right cerebrum with loss of consciousness of 30 minutes or less, initial encounter: Secondary | ICD-10-CM | POA: Diagnosis not present

## 2016-03-31 DIAGNOSIS — I615 Nontraumatic intracerebral hemorrhage, intraventricular: Secondary | ICD-10-CM | POA: Diagnosis not present

## 2016-03-31 DIAGNOSIS — R402132 Coma scale, eyes open, to sound, at arrival to emergency department: Secondary | ICD-10-CM | POA: Diagnosis present

## 2016-03-31 DIAGNOSIS — J690 Pneumonitis due to inhalation of food and vomit: Secondary | ICD-10-CM | POA: Diagnosis not present

## 2016-03-31 DIAGNOSIS — L89311 Pressure ulcer of right buttock, stage 1: Secondary | ICD-10-CM | POA: Diagnosis present

## 2016-03-31 DIAGNOSIS — R600 Localized edema: Secondary | ICD-10-CM | POA: Diagnosis not present

## 2016-03-31 DIAGNOSIS — M6281 Muscle weakness (generalized): Secondary | ICD-10-CM | POA: Diagnosis not present

## 2016-03-31 DIAGNOSIS — Z431 Encounter for attention to gastrostomy: Secondary | ICD-10-CM | POA: Diagnosis not present

## 2016-03-31 DIAGNOSIS — R579 Shock, unspecified: Secondary | ICD-10-CM | POA: Diagnosis not present

## 2016-03-31 DIAGNOSIS — I618 Other nontraumatic intracerebral hemorrhage: Secondary | ICD-10-CM | POA: Diagnosis not present

## 2016-03-31 DIAGNOSIS — J9601 Acute respiratory failure with hypoxia: Secondary | ICD-10-CM | POA: Diagnosis not present

## 2016-03-31 DIAGNOSIS — R4182 Altered mental status, unspecified: Secondary | ICD-10-CM | POA: Diagnosis not present

## 2016-03-31 DIAGNOSIS — S098XXA Other specified injuries of head, initial encounter: Secondary | ICD-10-CM | POA: Diagnosis not present

## 2016-03-31 DIAGNOSIS — R402242 Coma scale, best verbal response, confused conversation, at arrival to emergency department: Secondary | ICD-10-CM | POA: Diagnosis present

## 2016-03-31 DIAGNOSIS — Z452 Encounter for adjustment and management of vascular access device: Secondary | ICD-10-CM | POA: Diagnosis not present

## 2016-03-31 DIAGNOSIS — R1310 Dysphagia, unspecified: Secondary | ICD-10-CM | POA: Diagnosis not present

## 2016-03-31 DIAGNOSIS — Z1612 Extended spectrum beta lactamase (ESBL) resistance: Secondary | ICD-10-CM | POA: Diagnosis present

## 2016-03-31 DIAGNOSIS — A419 Sepsis, unspecified organism: Secondary | ICD-10-CM | POA: Diagnosis not present

## 2016-03-31 DIAGNOSIS — Z01818 Encounter for other preprocedural examination: Secondary | ICD-10-CM | POA: Diagnosis not present

## 2016-03-31 DIAGNOSIS — S06349A Traumatic hemorrhage of right cerebrum with loss of consciousness of unspecified duration, initial encounter: Secondary | ICD-10-CM | POA: Diagnosis not present

## 2016-03-31 DIAGNOSIS — Z7409 Other reduced mobility: Secondary | ICD-10-CM | POA: Diagnosis not present

## 2016-03-31 DIAGNOSIS — S06340A Traumatic hemorrhage of right cerebrum without loss of consciousness, initial encounter: Secondary | ICD-10-CM | POA: Diagnosis not present

## 2016-03-31 DIAGNOSIS — R739 Hyperglycemia, unspecified: Secondary | ICD-10-CM | POA: Diagnosis not present

## 2016-03-31 DIAGNOSIS — S06349D Traumatic hemorrhage of right cerebrum with loss of consciousness of unspecified duration, subsequent encounter: Secondary | ICD-10-CM | POA: Diagnosis not present

## 2016-03-31 DIAGNOSIS — Z993 Dependence on wheelchair: Secondary | ICD-10-CM | POA: Diagnosis not present

## 2016-03-31 DIAGNOSIS — E870 Hyperosmolality and hypernatremia: Secondary | ICD-10-CM | POA: Diagnosis not present

## 2016-03-31 DIAGNOSIS — N179 Acute kidney failure, unspecified: Secondary | ICD-10-CM | POA: Diagnosis not present

## 2016-03-31 DIAGNOSIS — R1312 Dysphagia, oropharyngeal phase: Secondary | ICD-10-CM | POA: Diagnosis not present

## 2016-03-31 DIAGNOSIS — Z781 Physical restraint status: Secondary | ICD-10-CM | POA: Diagnosis not present

## 2016-03-31 DIAGNOSIS — S01431A Puncture wound without foreign body of right cheek and temporomandibular area, initial encounter: Secondary | ICD-10-CM | POA: Diagnosis not present

## 2016-03-31 DIAGNOSIS — I493 Ventricular premature depolarization: Secondary | ICD-10-CM | POA: Diagnosis not present

## 2016-03-31 DIAGNOSIS — I959 Hypotension, unspecified: Secondary | ICD-10-CM | POA: Diagnosis not present

## 2016-03-31 DIAGNOSIS — Z6834 Body mass index (BMI) 34.0-34.9, adult: Secondary | ICD-10-CM | POA: Diagnosis not present

## 2016-03-31 DIAGNOSIS — J189 Pneumonia, unspecified organism: Secondary | ICD-10-CM | POA: Diagnosis not present

## 2016-03-31 DIAGNOSIS — Z430 Encounter for attention to tracheostomy: Secondary | ICD-10-CM | POA: Diagnosis not present

## 2016-03-31 DIAGNOSIS — M1990 Unspecified osteoarthritis, unspecified site: Secondary | ICD-10-CM | POA: Diagnosis not present

## 2016-03-31 DIAGNOSIS — R570 Cardiogenic shock: Secondary | ICD-10-CM | POA: Diagnosis not present

## 2016-03-31 DIAGNOSIS — M810 Age-related osteoporosis without current pathological fracture: Secondary | ICD-10-CM | POA: Diagnosis not present

## 2016-03-31 DIAGNOSIS — N3000 Acute cystitis without hematuria: Secondary | ICD-10-CM | POA: Diagnosis present

## 2016-03-31 DIAGNOSIS — R402362 Coma scale, best motor response, obeys commands, at arrival to emergency department: Secondary | ICD-10-CM | POA: Diagnosis present

## 2016-03-31 DIAGNOSIS — I10 Essential (primary) hypertension: Secondary | ICD-10-CM | POA: Diagnosis present

## 2016-03-31 DIAGNOSIS — R918 Other nonspecific abnormal finding of lung field: Secondary | ICD-10-CM | POA: Diagnosis not present

## 2016-03-31 DIAGNOSIS — R404 Transient alteration of awareness: Secondary | ICD-10-CM | POA: Diagnosis not present

## 2016-03-31 DIAGNOSIS — G8324 Monoplegia of upper limb affecting left nondominant side: Secondary | ICD-10-CM | POA: Diagnosis not present

## 2016-03-31 DIAGNOSIS — Z9181 History of falling: Secondary | ICD-10-CM | POA: Diagnosis not present

## 2016-03-31 DIAGNOSIS — L89312 Pressure ulcer of right buttock, stage 2: Secondary | ICD-10-CM | POA: Diagnosis not present

## 2016-03-31 DIAGNOSIS — G9340 Encephalopathy, unspecified: Secondary | ICD-10-CM | POA: Diagnosis not present

## 2016-03-31 DIAGNOSIS — E669 Obesity, unspecified: Secondary | ICD-10-CM | POA: Diagnosis present

## 2016-03-31 DIAGNOSIS — R410 Disorientation, unspecified: Secondary | ICD-10-CM | POA: Diagnosis not present

## 2016-03-31 LAB — CBC AND DIFFERENTIAL
HCT: 39 % (ref 36–46)
HCT: 39 % (ref 36–46)
HEMOGLOBIN: 13.2 g/dL (ref 12.0–16.0)
Hemoglobin: 13.2 g/dL (ref 12.0–16.0)
PLATELETS: 185 10*3/uL (ref 150–399)
Platelets: 185 10*3/uL (ref 150–399)
WBC: 15 10*3/mL
WBC: 15 10^3/mL

## 2016-03-31 LAB — BASIC METABOLIC PANEL
BUN: 17 mg/dL (ref 4–21)
BUN: 4 mg/dL (ref 4–21)
CREATININE: 0.7 mg/dL (ref 0.5–1.1)
Creatinine: 0.7 mg/dL (ref 0.5–1.1)
POTASSIUM: 3.7 mmol/L (ref 3.4–5.3)
POTASSIUM: 3.7 mmol/L (ref 3.4–5.3)
SODIUM: 142 mmol/L (ref 137–147)
SODIUM: 142 mmol/L (ref 137–147)

## 2016-03-31 LAB — POCT INR: INR: 1.1 (ref 0.9–1.1)

## 2016-04-03 LAB — HEPATIC FUNCTION PANEL: Bilirubin, Total: 0.6 mg/dL

## 2016-04-10 LAB — BASIC METABOLIC PANEL
Creatinine: 0.6 mg/dL (ref 0.5–1.1)
Potassium: 3.7 mmol/L (ref 3.4–5.3)

## 2016-04-10 LAB — CBC AND DIFFERENTIAL
HEMATOCRIT: 35 % — AB (ref 36–46)
HEMOGLOBIN: 11.4 g/dL — AB (ref 12.0–16.0)
PLATELETS: 210 10*3/uL (ref 150–399)
WBC: 14 10*3/mL

## 2016-04-12 LAB — CBC AND DIFFERENTIAL
HEMOGLOBIN: 10.1 g/dL — AB (ref 12.0–16.0)
Platelets: 156 10*3/uL (ref 150–399)
WBC: 12.5 10^3/mL

## 2016-04-12 LAB — BASIC METABOLIC PANEL
BUN: 20 mg/dL (ref 4–21)
Creatinine: 0.5 mg/dL (ref 0.5–1.1)
Potassium: 4 mmol/L (ref 3.4–5.3)
Sodium: 139 mmol/L (ref 137–147)

## 2016-04-15 DIAGNOSIS — S069X6D Unspecified intracranial injury with loss of consciousness greater than 24 hours without return to pre-existing conscious level with patient surviving, subsequent encounter: Secondary | ICD-10-CM | POA: Diagnosis not present

## 2016-04-15 DIAGNOSIS — Z1612 Extended spectrum beta lactamase (ESBL) resistance: Secondary | ICD-10-CM | POA: Diagnosis not present

## 2016-04-15 DIAGNOSIS — R1310 Dysphagia, unspecified: Secondary | ICD-10-CM | POA: Diagnosis not present

## 2016-04-15 DIAGNOSIS — G903 Multi-system degeneration of the autonomic nervous system: Secondary | ICD-10-CM | POA: Diagnosis not present

## 2016-04-15 DIAGNOSIS — A498 Other bacterial infections of unspecified site: Secondary | ICD-10-CM | POA: Diagnosis not present

## 2016-04-15 DIAGNOSIS — I10 Essential (primary) hypertension: Secondary | ICD-10-CM | POA: Diagnosis not present

## 2016-04-15 DIAGNOSIS — R488 Other symbolic dysfunctions: Secondary | ICD-10-CM | POA: Diagnosis not present

## 2016-04-15 DIAGNOSIS — S062X9D Diffuse traumatic brain injury with loss of consciousness of unspecified duration, subsequent encounter: Secondary | ICD-10-CM | POA: Diagnosis not present

## 2016-04-15 DIAGNOSIS — M6281 Muscle weakness (generalized): Secondary | ICD-10-CM | POA: Diagnosis not present

## 2016-04-15 DIAGNOSIS — S06349D Traumatic hemorrhage of right cerebrum with loss of consciousness of unspecified duration, subsequent encounter: Secondary | ICD-10-CM | POA: Diagnosis not present

## 2016-04-15 DIAGNOSIS — S01401D Unspecified open wound of right cheek and temporomandibular area, subsequent encounter: Secondary | ICD-10-CM | POA: Diagnosis not present

## 2016-04-15 DIAGNOSIS — N390 Urinary tract infection, site not specified: Secondary | ICD-10-CM | POA: Diagnosis not present

## 2016-04-15 DIAGNOSIS — Z431 Encounter for attention to gastrostomy: Secondary | ICD-10-CM | POA: Diagnosis not present

## 2016-04-15 DIAGNOSIS — R278 Other lack of coordination: Secondary | ICD-10-CM | POA: Diagnosis not present

## 2016-04-15 DIAGNOSIS — S0181XS Laceration without foreign body of other part of head, sequela: Secondary | ICD-10-CM | POA: Diagnosis not present

## 2016-04-15 DIAGNOSIS — R1312 Dysphagia, oropharyngeal phase: Secondary | ICD-10-CM | POA: Diagnosis not present

## 2016-04-15 DIAGNOSIS — R601 Generalized edema: Secondary | ICD-10-CM | POA: Diagnosis not present

## 2016-04-15 DIAGNOSIS — Z931 Gastrostomy status: Secondary | ICD-10-CM | POA: Diagnosis not present

## 2016-04-15 DIAGNOSIS — M109 Gout, unspecified: Secondary | ICD-10-CM | POA: Diagnosis not present

## 2016-04-15 DIAGNOSIS — E669 Obesity, unspecified: Secondary | ICD-10-CM | POA: Diagnosis not present

## 2016-04-15 DIAGNOSIS — S21202D Unspecified open wound of left back wall of thorax without penetration into thoracic cavity, subsequent encounter: Secondary | ICD-10-CM | POA: Diagnosis not present

## 2016-04-15 DIAGNOSIS — Z9181 History of falling: Secondary | ICD-10-CM | POA: Diagnosis not present

## 2016-04-15 DIAGNOSIS — M1990 Unspecified osteoarthritis, unspecified site: Secondary | ICD-10-CM | POA: Diagnosis not present

## 2016-04-15 DIAGNOSIS — S21202A Unspecified open wound of left back wall of thorax without penetration into thoracic cavity, initial encounter: Secondary | ICD-10-CM | POA: Diagnosis not present

## 2016-04-15 DIAGNOSIS — R404 Transient alteration of awareness: Secondary | ICD-10-CM | POA: Diagnosis not present

## 2016-04-15 DIAGNOSIS — S01401A Unspecified open wound of right cheek and temporomandibular area, initial encounter: Secondary | ICD-10-CM | POA: Diagnosis not present

## 2016-04-15 DIAGNOSIS — M810 Age-related osteoporosis without current pathological fracture: Secondary | ICD-10-CM | POA: Diagnosis not present

## 2016-04-16 ENCOUNTER — Non-Acute Institutional Stay (SKILLED_NURSING_FACILITY): Payer: Medicare Other | Admitting: Internal Medicine

## 2016-04-16 ENCOUNTER — Encounter: Payer: Self-pay | Admitting: Internal Medicine

## 2016-04-16 DIAGNOSIS — Z1612 Extended spectrum beta lactamase (ESBL) resistance: Secondary | ICD-10-CM | POA: Diagnosis not present

## 2016-04-16 DIAGNOSIS — N390 Urinary tract infection, site not specified: Secondary | ICD-10-CM

## 2016-04-16 DIAGNOSIS — S01401D Unspecified open wound of right cheek and temporomandibular area, subsequent encounter: Secondary | ICD-10-CM | POA: Diagnosis not present

## 2016-04-16 DIAGNOSIS — A498 Other bacterial infections of unspecified site: Secondary | ICD-10-CM | POA: Diagnosis not present

## 2016-04-16 DIAGNOSIS — M109 Gout, unspecified: Secondary | ICD-10-CM | POA: Insufficient documentation

## 2016-04-16 DIAGNOSIS — Z931 Gastrostomy status: Secondary | ICD-10-CM

## 2016-04-16 DIAGNOSIS — S069X6D Unspecified intracranial injury with loss of consciousness greater than 24 hours without return to pre-existing conscious level with patient surviving, subsequent encounter: Secondary | ICD-10-CM | POA: Diagnosis not present

## 2016-04-16 DIAGNOSIS — B9629 Other Escherichia coli [E. coli] as the cause of diseases classified elsewhere: Secondary | ICD-10-CM

## 2016-04-16 DIAGNOSIS — S0181XS Laceration without foreign body of other part of head, sequela: Secondary | ICD-10-CM | POA: Diagnosis not present

## 2016-04-16 DIAGNOSIS — G903 Multi-system degeneration of the autonomic nervous system: Secondary | ICD-10-CM

## 2016-04-16 DIAGNOSIS — I10 Essential (primary) hypertension: Secondary | ICD-10-CM

## 2016-04-16 DIAGNOSIS — R601 Generalized edema: Secondary | ICD-10-CM

## 2016-04-16 NOTE — Progress Notes (Addendum)
: Provider:  [name], MD Location:  [facility] Room Number: 259D Place of Service:  SNF (31)  PCP: No primary care provider on file. No care team member to display  Extended Emergency Contact Information Primary Emergency Contact: [place] Mobile Phone: [phone] Relation: Son Secondary Emergency Contact: [name],[name]  [place] Relation: Sister     Allergies: Demerol [meperidine]  Chief Complaint  Patient presents with  . New Admit To SNF    Admit to Facility    HPI: Patient is 81 y.o. female with Hypertension who presented to about to his hospital after a fall in which she sustained a facial laceration of foreign body penetrating her right cheek and an intraparenchymal hemorrhage. patient was admitted to [facility] from february 26 2 march 12 with the above injuries. patient was intubated and placed in the neuro icu. when she had worsening neurological exam and cerebral edema per ct she had an intraparenchymal catheter placed followed by intermittent tpa administration and hematoma evacuation. Pt intermiddently  required pressors to maintain mean arterial pressure but was able to be weaned off during  her icu stay she was successfully extubated on 3/4 and transferred to the floor. Given the severity of her CNS insult patient's mental status remained poor and a PEG tube was placed on 394 tube feeds. Patient was also found to have ESBL Escherichia coli UTI for which she received a seven-day course of Piperacillen/tazobactam. Patient's right cheek wound was taken care of by ENT with packing twice daily with plan to continue twice a packing until it resolves. Patient is now considered stable enough to be transferred to skilled nursing facility. Patient is admitted to skilled nursing facility for supportive care wound care and OT/PT.   Past Medical History:  Diagnosis  Date  . Gout   . Hypertension     Past Surgical History:  Procedure Laterality Date  . MASTECTOMY      Allergies as of 04/16/2016      Reactions   Demerol [meperidine] Swelling      Medication List       Accurate as of 04/16/16 10:20 AM. Always use your most recent med list.          docusate sodium 100 MG capsule Commonly known as:  COLACE Take 100 mg by mouth daily as needed for mild constipation.   furosemide 20 MG tablet Commonly known as:  LASIX Take 20 mg by mouth daily.   HYDROcodone-acetaminophen 5-325 MG tablet Commonly known as:  NORCO/VICODIN Take 1 tablet by mouth.   meloxicam 7.5 MG tablet Commonly known as:  MOBIC Take 7.5 mg by mouth daily.   metoprolol 200 MG 24 hr tablet Commonly known as:  TOPROL-XL Take 200 mg by mouth daily.       Meds ordered this encounter  Medications  . docusate sodium (COLACE) 100 MG capsule    Sig: Take 100 mg by mouth daily as needed for mild constipation.  . furosemide (LASIX) 20 MG tablet    Sig: Take 20 mg by mouth daily.  Marland Kitchen HYDROcodone-acetaminophen (NORCO/VICODIN) 5-325 MG tablet    Sig: Take 1 tablet by mouth.  . meloxicam (MOBIC) 7.5 MG tablet    Sig: Take 7.5 mg by mouth daily.  . metoprolol (TOPROL-XL) 200 MG 24 hr tablet    Sig: Take 200 mg by mouth daily.    Immunization History  Administered Date(s) Administered  .  Pneumococcal Polysaccharide-23 04/15/2016  . Tdap 03/31/2016    Social History  Substance Use Topics  . Smoking status: Never Smoker  . Smokeless tobacco: Never Used  . Alcohol use No    Family history is   Family History  Problem Relation Age of Onset  . Asthma Mother   . Anesthesia problems Neg Hx   . Arthritis Neg Hx   . Cancer Neg Hx   . Cerebral palsy Neg Hx   . Clotting disorder Neg Hx   . Club foot Neg Hx   . Collagen disease Neg Hx   . Deep vein thrombosis Neg Hx   . Gait disorder Neg Hx   . Heart disease Neg Hx   . Gout Neg Hx   . Diabetes Neg Hx   . Hip  dysplasia Neg Hx   . Hip fracture Neg Hx   . Hypermobility Neg Hx   . Hypertension Neg Hx   . Osteoporosis Neg Hx   . Other Neg Hx   . Stroke Neg Hx   . Thyroid disease Neg Hx   . Vasculitis Neg Hx   . Spina bifida Neg Hx   . Scoliosis Neg Hx   . Rheumatologic disease Neg Hx   . Pulmonary embolism Neg Hx       Review of   UTO 2/2 MS; per nursing no concerns     Vitals:   04/16/16 0936  BP: (!) 147/91  Pulse: 100  Resp: (!) 22  Temp: (!) 96.5 F (35.8 C)    SpO2 Readings from Last 1 Encounters:  04/16/16 97%   Body mass index is 39.41 kg/m.     Physical Exam  GENERAL APPEARANCE: eyes open, non  conversant,  No acute distress.  SKIN: packed R cheek wound- obvious pus can be extruded HEAD: Normocephalic, atraumatic  EYES: Conjunctiva/lids clear. Pupils round, reactive. EOMs intact.  EARS: External exam WNL, canals clear. Hearing grossly normal.  NOSE: No deformity or discharge.  MOUTH/THROAT: Lips w/o lesions  RESPIRATORY: Breathing is even, unlabored. Lung sounds are clear   CARDIOVASCULAR: Heart RRR no murmurs, rubs or gallops; anasarca BUE and BLE   GASTROINTESTINAL: Abdomen is soft, non-tender, not distended w/ normal bowel sounds. GENITOURINARY: Bladder non tender, not distended  MUSCULOSKELETAL: No abnormal joints or musculature NEUROLOGIC:  Pt responds to her name and made eye contact with me; purposeful movement RUE, no movement of LUE or legs PSYCHIATRIC: appears calm  Patient Active Problem List   Diagnosis Date Noted  . Gout   . Hypertension       Labs reviewed: Basic Metabolic Panel:    Component Value Date/Time   NA 139 04/12/2016   K 4.0 04/12/2016   BUN 20 04/12/2016   CREATININE 0.5 04/12/2016   AST 16 08/05/2013   ALT 9 08/05/2013     Recent Labs  03/31/16 03/31/16 1838 04/10/16 04/12/16  NA 142 142  --  139  K 3.7 3.7 3.7 4.0  BUN 4 17  --  20  CREATININE 0.7 0.7 0.6 0.5   Liver Function Tests: No results for input(s):  AST, ALT, ALKPHOS, BILITOT, PROT, ALBUMIN in the last 8760 hours. No results for input(s): LIPASE, AMYLASE in the last 8760 hours. No results for input(s): AMMONIA in the last 8760 hours. CBC:  Recent Labs  03/31/16 03/31/16 1838 04/10/16 04/12/16  WBC 15.0 15.0 14.0 12.5  HGB 13.2 13.2 11.4* 10.1*  HCT 39 39 35*  --   PLT 185 185  210 156   Lipid No results for input(s): CHOL, HDL, LDLCALC, TRIG in the last 8760 hours.  Cardiac Enzymes: No results for input(s): CKTOTAL, CKMB, CKMBINDEX, TROPONINI in the last 8760 hours. BNP: No results for input(s): BNP in the last 8760 hours. No results found for: MICROALBUR No results found for: HGBA1C Lab Results  Component Value Date   TSH 1.68 08/06/2013   No results found for: VITAMINB12 No results found for: FOLATE No results found for: IRON, TIBC, FERRITIN  Imaging and Procedures obtained prior to SNF admission: Patient was never admitted.   Not all labs, radiology exams or other studies done during hospitalization come through on my EPIC note; however they are reviewed by me.    Assessment and Plan  R FRONTAL INTRPARENCHYMAL HEMORRHAGE WITH INTRAVENTRICULAR EXTENSION/ PEG tube - tx with intraparenchymal catheter, intermiddent tPA and hematoma evacuation; poor mental status still so PEG tube place SNF - admitted for OT/PT and wound care  R CHEEK WOUND/FACIAL LACERATION - facial lac sutured in ED, ENT took care of penetrating wound to R cheek with packing changes daily SNF - wound with packing in place extrudes onvious pus; have written for clindamycin 300 mg q 6 for 7 days  ESBL E Coli UTI - treated with pip/taz for 7 days with resolution  HTN/HYPOTENSION/ANASARCA- present during hospital stay; pt; latge fluid for resusucitation;  now pt needs BP meds again and diuretic SNF - cont metoprolol XL 200 mg daily and lasix 20 mg daily   Time spent > 45 min;> 50% of time with patient was spent reviewing records, labs, tests and  studies, counseling and developing plan of care  Webb Silversmith D. [name], MD

## 2016-04-21 ENCOUNTER — Encounter: Payer: Self-pay | Admitting: Internal Medicine

## 2016-04-21 DIAGNOSIS — Z931 Gastrostomy status: Secondary | ICD-10-CM

## 2016-04-21 DIAGNOSIS — S0181XA Laceration without foreign body of other part of head, initial encounter: Secondary | ICD-10-CM | POA: Insufficient documentation

## 2016-04-21 DIAGNOSIS — S069X9A Unspecified intracranial injury with loss of consciousness of unspecified duration, initial encounter: Secondary | ICD-10-CM | POA: Insufficient documentation

## 2016-04-21 DIAGNOSIS — S069XAA Unspecified intracranial injury with loss of consciousness status unknown, initial encounter: Secondary | ICD-10-CM | POA: Insufficient documentation

## 2016-04-21 DIAGNOSIS — Z1612 Extended spectrum beta lactamase (ESBL) resistance: Secondary | ICD-10-CM

## 2016-04-21 DIAGNOSIS — N390 Urinary tract infection, site not specified: Secondary | ICD-10-CM

## 2016-04-21 DIAGNOSIS — B9629 Other Escherichia coli [E. coli] as the cause of diseases classified elsewhere: Secondary | ICD-10-CM | POA: Insufficient documentation

## 2016-04-21 DIAGNOSIS — S01401D Unspecified open wound of right cheek and temporomandibular area, subsequent encounter: Secondary | ICD-10-CM | POA: Insufficient documentation

## 2016-04-21 DIAGNOSIS — I959 Hypotension, unspecified: Secondary | ICD-10-CM | POA: Insufficient documentation

## 2016-04-21 HISTORY — DX: Unspecified intracranial injury with loss of consciousness status unknown, initial encounter: S06.9XAA

## 2016-04-21 HISTORY — DX: Unspecified intracranial injury with loss of consciousness of unspecified duration, initial encounter: S06.9X9A

## 2016-04-21 HISTORY — DX: Gastrostomy status: Z93.1

## 2016-04-23 DIAGNOSIS — S21202D Unspecified open wound of left back wall of thorax without penetration into thoracic cavity, subsequent encounter: Secondary | ICD-10-CM | POA: Diagnosis not present

## 2016-04-23 DIAGNOSIS — S01401D Unspecified open wound of right cheek and temporomandibular area, subsequent encounter: Secondary | ICD-10-CM | POA: Diagnosis not present

## 2016-04-23 DIAGNOSIS — S21202A Unspecified open wound of left back wall of thorax without penetration into thoracic cavity, initial encounter: Secondary | ICD-10-CM | POA: Diagnosis not present

## 2016-04-23 DIAGNOSIS — S01401A Unspecified open wound of right cheek and temporomandibular area, initial encounter: Secondary | ICD-10-CM | POA: Diagnosis not present

## 2016-05-01 DIAGNOSIS — S01401D Unspecified open wound of right cheek and temporomandibular area, subsequent encounter: Secondary | ICD-10-CM | POA: Diagnosis not present

## 2016-05-01 DIAGNOSIS — S21202D Unspecified open wound of left back wall of thorax without penetration into thoracic cavity, subsequent encounter: Secondary | ICD-10-CM | POA: Diagnosis not present

## 2016-05-01 DIAGNOSIS — S21202A Unspecified open wound of left back wall of thorax without penetration into thoracic cavity, initial encounter: Secondary | ICD-10-CM | POA: Diagnosis not present

## 2016-05-01 DIAGNOSIS — S01401A Unspecified open wound of right cheek and temporomandibular area, initial encounter: Secondary | ICD-10-CM | POA: Diagnosis not present

## 2016-05-07 DIAGNOSIS — S01401D Unspecified open wound of right cheek and temporomandibular area, subsequent encounter: Secondary | ICD-10-CM | POA: Diagnosis not present

## 2016-05-07 DIAGNOSIS — S21202D Unspecified open wound of left back wall of thorax without penetration into thoracic cavity, subsequent encounter: Secondary | ICD-10-CM | POA: Diagnosis not present

## 2016-05-07 DIAGNOSIS — S21202A Unspecified open wound of left back wall of thorax without penetration into thoracic cavity, initial encounter: Secondary | ICD-10-CM | POA: Diagnosis not present

## 2016-05-07 DIAGNOSIS — S01401A Unspecified open wound of right cheek and temporomandibular area, initial encounter: Secondary | ICD-10-CM | POA: Diagnosis not present

## 2016-05-16 DIAGNOSIS — S21202D Unspecified open wound of left back wall of thorax without penetration into thoracic cavity, subsequent encounter: Secondary | ICD-10-CM | POA: Diagnosis not present

## 2016-05-16 DIAGNOSIS — S21202A Unspecified open wound of left back wall of thorax without penetration into thoracic cavity, initial encounter: Secondary | ICD-10-CM | POA: Diagnosis not present

## 2016-05-16 DIAGNOSIS — S01401A Unspecified open wound of right cheek and temporomandibular area, initial encounter: Secondary | ICD-10-CM | POA: Diagnosis not present

## 2016-05-16 DIAGNOSIS — S01401D Unspecified open wound of right cheek and temporomandibular area, subsequent encounter: Secondary | ICD-10-CM | POA: Diagnosis not present

## 2016-05-20 ENCOUNTER — Non-Acute Institutional Stay (SKILLED_NURSING_FACILITY): Payer: Medicare Other | Admitting: Internal Medicine

## 2016-05-20 DIAGNOSIS — E669 Obesity, unspecified: Secondary | ICD-10-CM | POA: Diagnosis not present

## 2016-05-20 DIAGNOSIS — I10 Essential (primary) hypertension: Secondary | ICD-10-CM

## 2016-05-20 DIAGNOSIS — R601 Generalized edema: Secondary | ICD-10-CM | POA: Diagnosis not present

## 2016-05-20 DIAGNOSIS — E66811 Obesity, class 1: Secondary | ICD-10-CM

## 2016-05-21 ENCOUNTER — Encounter: Payer: Self-pay | Admitting: Internal Medicine

## 2016-05-21 ENCOUNTER — Non-Acute Institutional Stay (SKILLED_NURSING_FACILITY): Payer: Medicare Other | Admitting: Internal Medicine

## 2016-05-21 DIAGNOSIS — Z931 Gastrostomy status: Secondary | ICD-10-CM | POA: Diagnosis not present

## 2016-05-21 DIAGNOSIS — E669 Obesity, unspecified: Secondary | ICD-10-CM

## 2016-05-21 DIAGNOSIS — I10 Essential (primary) hypertension: Secondary | ICD-10-CM

## 2016-05-21 DIAGNOSIS — E66811 Obesity, class 1: Secondary | ICD-10-CM

## 2016-05-21 DIAGNOSIS — R601 Generalized edema: Secondary | ICD-10-CM

## 2016-05-21 DIAGNOSIS — M109 Gout, unspecified: Secondary | ICD-10-CM | POA: Diagnosis not present

## 2016-05-21 HISTORY — DX: Generalized edema: R60.1

## 2016-05-21 HISTORY — DX: Obesity, class 1: E66.811

## 2016-05-21 HISTORY — DX: Obesity, unspecified: E66.9

## 2016-05-21 NOTE — Assessment & Plan Note (Signed)
Pt is 5'1" and weighs now 183 pounds; need to consider decreasing calories while maintaining protein; based on BMI pt needs less calories

## 2016-05-21 NOTE — Assessment & Plan Note (Signed)
Well controlled; needs to be back on lasix for peripheral edema; have written

## 2016-05-21 NOTE — Assessment & Plan Note (Addendum)
Pt was admitted with overall edema and weight of 188.8 pounds; during first week with diuresis pt went from 188 to 176.6- this is considered fluid loss, pt has only trace LE edema now and pt was getting continuous feeding through PEG at that time so it cannot be caloric loss; will continue to monitor weight

## 2016-05-21 NOTE — Progress Notes (Signed)
Location:  [facility] Room Number: 532D Place of Service:  SNF (31)  Autumn Bryant. Autumn Coil, MD  No care team member to display  Extended Emergency Contact Information Primary Emergency Contact: Bonifas,Brian Address: [address] [place], [place] [address] [name] of [place] Phone: [phone] Relation: Son Secondary Emergency Contact: [name],[name]          [place], [place] Relation: Sister    Allergies: Demerol [meperidine]  Chief Complaint  Patient presents with  . Acute Visit    HPI: Patient is 81 y.o. female who is being seen for routine issues of anasarca, obesity and HTN.  Past Medical History:  Diagnosis Date  . Gout   . Hypertension     Past Surgical History:  Procedure Laterality Date  . MASTECTOMY      Allergies as of 05/20/2016      Reactions   Demerol [meperidine] Swelling      Medication List       Accurate as of 05/20/16 11:59 PM. Always use your most recent med list.          acetaminophen 500 MG tablet Commonly known as:  TYLENOL Place 1,000 mg into feeding tube 3 (three) times daily.   feeding supplement (PRO-STAT SUGAR FREE 64) Liqd Place 30 mLs into feeding tube 2 (two) times daily.   OSMOLITE Liqd 29m/hr by g tube continuously to provide 1440 calories       No orders of the defined types were placed in this encounter.   Immunization History  Administered Date(s) Administered  . Pneumococcal Polysaccharide-23 04/15/2016  . Tdap 03/31/2016    Social History  Substance Use Topics  . Smoking status: Never Smoker  . Smokeless tobacco: Never Used  . Alcohol use No    Review of Systems  DATA OBTAINED: from patient, nurse GENERAL:  no fevers, fatigue, appetite changes SKIN: No itching, rash HEENT: No complaint RESPIRATORY: No cough, wheezing, SOB CARDIAC: No chest pain, palpitations, lower extremity edema  GI: No abdominal pain, No N/V/D or  constipation, No heartburn or reflux  GU: No dysuria, frequency or urgency, or incontinence  MUSCULOSKELETAL: No unrelieved bone/joint pain NEUROLOGIC: No headache, dizziness  PSYCHIATRIC: No overt anxiety or sadness  Vitals:   05/20/16 1212  BP: (!) 102/59  Pulse: 85  Resp: 10  Temp: 98.5 F (36.9 C)   Body mass index is 34.65 kg/m. Physical Exam  GENERAL APPEARANCE: Alert, conversant, No acute distress  SKIN: cheek wound healing nicely HEENT: Unremarkable RESPIRATORY: Breathing is even, unlabored. Lung sounds are clear   CARDIOVASCULAR: Heart RRR no murmurs, rubs or gallops. No peripheral edema  GASTROINTESTINAL: Abdomen is soft, non-tender, not distended w/ normal bowel sounds.  GENITOURINARY: Bladder non tender, not distended  MUSCULOSKELETAL: No abnormal joints or musculature NEUROLOGIC: Cranial nerves 2-12 grossly intact. Moves all extremities PSYCHIATRIC: Mood and affect a little vague, no behavioral issues  Patient Active Problem List   Diagnosis Date Noted  . Anasarca 05/21/2016  . Obesity (BMI 30.0-34.9) 05/21/2016  . [place] [place]  . Status post insertion of percutaneous endoscopic gastrostomy (PEG) tube ([facility] 04/21/2016  . Facial laceration 04/21/2016  . Cheek wound, right, subsequent encounter 04/21/2016  . Urinary tract infection due to extended-spectrum beta lactamase (ESBL) producing Escherichia coli 04/21/2016  . Hypotension 04/21/2016  . Gout   . Hypertension     CMP  Component Value Date/Time   NA 139 04/12/2016   K 4.0 04/12/2016   BUN 20 04/12/2016   CREATININE 0.5 04/12/2016   AST 16 08/05/2013   ALT 9 08/05/2013    Recent Labs  03/31/16 03/31/16 1838 04/10/16 04/12/16  NA 142 142  --  139  K 3.7 3.7 3.7 4.0  BUN 4 17  --  20  CREATININE 0.7 0.7 0.6 0.5   No results for input(s): AST, ALT, ALKPHOS, BILITOT, PROT, ALBUMIN in the last 8760 hours.  Recent Labs  03/31/16 03/31/16 1838  04/10/16 04/12/16  WBC 15.0 15.0 14.0 12.5  HGB 13.2 13.2 11.4* 10.1*  HCT 39 39 35*  --   PLT 185 185 210 156   No results for input(s): CHOL, LDLCALC, TRIG in the last 8760 hours.  Invalid input(s): HCL No results found for: MICROALBUR Lab Results  Component Value Date   TSH 1.68 08/06/2013   No results found for: HGBA1C No results found for: CHOL, HDL, LDLCALC, LDLDIRECT, TRIG, CHOLHDL  Significant Diagnostic Results in last 30 days:  No results found.  Assessment and Plan  Anasarca Pt was admitted with overall edema and weight of 188.8 pounds; during first week with diuresis pt went from 188 to 176.6- this is considered fluid loss, pt has only trace LE edema now and pt was getting continuous feeding through PEG at that time so it cannot be caloric loss; will continue to monitor weight  Obesity (BMI 30.0-34.9) Pt is 5'1" and weighs now 183 pounds; need to consider decreasing calories while maintaining protein; based on BMI pt needs less calories  Hypertension Well controlled; needs to be back on lasix for peripheral edema; have written    Autumn Bryant. Autumn Coil, MD

## 2016-05-22 ENCOUNTER — Encounter: Payer: Self-pay | Admitting: Internal Medicine

## 2016-05-22 NOTE — Progress Notes (Signed)
Location:  [facility] Room Number: 387F Place of Service:  SNF (31)  [name], MD  No care team member to display  Extended Emergency Contact Information Primary Emergency Contact: Evelyn,Brian Address: [address] [place], [place] [address] [name] of [place] Phone: [phone] Relation: Son Secondary Emergency Contact: [name],[name]          [place], [place] Relation: Sister    Allergies: Demerol [meperidine]  Chief Complaint  Patient presents with  . Medical Management of Chronic Issues    Routine Visit    HPI: Patient is 81 y.o. female who is being seen for routine issues of HTN, anasarca and gout.  Past Medical History:  Diagnosis Date  . Gout   . Hypertension     Past Surgical History:  Procedure Laterality Date  . MASTECTOMY      Allergies as of 05/21/2016      Reactions   Demerol [meperidine] Swelling      Medication List       Accurate as of 05/21/16 11:59 PM. Always use your most recent med list.          acetaminophen 500 MG tablet Commonly known as:  TYLENOL Take 1,000 mg by mouth 3 (three) times daily.   feeding supplement (PRO-STAT SUGAR FREE 64) Liqd Place 30 mLs into feeding tube 2 (two) times daily.   furosemide 20 MG tablet Commonly known as:  LASIX Take 20 mg by mouth daily.   OSMOLITE 1.2 CAL PO Initiate Osmolite 1.2 by tube  daily due to poor intake.   sterile water for irrigation Irrigate with 60 mLs as directed 3 (three) times daily.       Meds ordered this encounter  Medications  . Nutritional Supplements (OSMOLITE 1.2 CAL PO)    Sig: Initiate Osmolite 1.2 by tube  daily due to poor intake.  . furosemide (LASIX) 20 MG tablet    Sig: Take 20 mg by mouth daily.  . Water For Irrigation, Sterile (STERILE WATER FOR IRRIGATION)    Sig: Irrigate with 60 mLs as directed 3 (three) times daily.    Immunization History  Administered  Date(s) Administered  . PPD Test 04/29/2016  . Pneumococcal Polysaccharide-23 04/15/2016  . Tdap 03/31/2016    Social History  Substance Use Topics  . Smoking status: Never Smoker  . Smokeless tobacco: Never Used  . Alcohol use No    Review of Systems  DATA OBTAINED: from patient, nurse GENERAL:  no fevers, fatigue, appetite changes SKIN: No itching, rash HEENT: No complaint RESPIRATORY: No cough, wheezing, SOB CARDIAC: No chest pain, palpitations, lower extremity edema  GI: No abdominal pain, No N/V/D or constipation, No heartburn or reflux  GU: No dysuria, frequency or urgency, or incontinence  MUSCULOSKELETAL: No unrelieved bone/joint pain NEUROLOGIC: No headache, dizziness  PSYCHIATRIC: No overt anxiety or sadness  Vitals:   05/21/16 1109  BP: 125/75  Pulse: 82  Resp: 18  Temp: 98 F (36.7 C)   Body mass index is 34.65 kg/m. Physical Exam  GENERAL APPEARANCE: Alert, conversant, No acute distress  SKIN: No diaphoresis rash HEENT: Unremarkable; cheek almost healed in RESPIRATORY: Breathing is even, unlabored. Lung sounds are clear   CARDIOVASCULAR: Heart RRR no murmurs, rubs or gallops. No peripheral edema  GASTROINTESTINAL: Abdomen is soft, non-tender, not distended w/ normal bowel sounds.  GENITOURINARY: Bladder non tender, not distended  MUSCULOSKELETAL: No  abnormal joints or musculature NEUROLOGIC: Cranial nerves 2-12 grossly intact. Moves all extremities PSYCHIATRIC: Mood and affect vague but improving, no behavioral issues  Patient Active Problem List   Diagnosis Date Noted  . Dysphagia 07/13/2016  . Anasarca 05/21/2016  . Obesity (BMI 30.0-34.9) 05/21/2016  . Closed TBI (traumatic brain injury) ([facility]) 04/21/2016  . Status post insertion of percutaneous endoscopic gastrostomy (PEG) tube ([facility] [facility]) 04/21/2016  . Facial laceration 04/21/2016  . Cheek wound, right, subsequent encounter 04/21/2016  . Urinary tract infection due to extended-spectrum beta  lactamase (ESBL) producing Escherichia coli 04/21/2016  . Hypotension 04/21/2016  . Gout   . Hypertension     CMP     Component Value Date/Time   NA 145 05/27/2016   K 3.9 05/27/2016   BUN 18 05/27/2016   CREATININE 0.5 05/27/2016   AST 16 08/05/2013   ALT 9 08/05/2013    Recent Labs  03/31/16 1838 04/10/16 04/12/16 05/27/16  NA 142  --  139 145  K 3.7 3.7 4.0 3.9  BUN 17  --  20 18  CREATININE 0.7 0.6 0.5 0.5   No results for input(s): AST, ALT, ALKPHOS, BILITOT, PROT, ALBUMIN in the last 8760 hours.  Recent Labs  03/31/16 03/31/16 1838 04/10/16 04/12/16  WBC 15.0 15.0 14.0 12.5  HGB 13.2 13.2 11.4* 10.1*  HCT 39 39 35*  --   PLT 185 185 210 156   No results for input(s): CHOL, LDLCALC, TRIG in the last 8760 hours.  Invalid input(s): HCL No results found for: MICROALBUR Lab Results  Component Value Date   TSH 1.68 08/06/2013   No results found for: HGBA1C No results found for: CHOL, HDL, LDLCALC, LDLDIRECT, TRIG, CHOLHDL  Significant Diagnostic Results in last 30 days:  No results found.  Assessment and Plan  Hypertension Well controlled ;cot lasix 20 mg daily  Anasarca Swelling continues to improve; cont low dose lasix 20 mg daily  Gout Pt is on no meds and has had no gout flares; will cont to monitor     Thanh Mottern D. [name], MD

## 2016-05-27 LAB — BASIC METABOLIC PANEL
BUN: 18 mg/dL (ref 4–21)
CREATININE: 0.5 mg/dL (ref 0.5–1.1)
Glucose: 92 mg/dL
Potassium: 3.9 mmol/L (ref 3.4–5.3)
Sodium: 145 mmol/L (ref 137–147)

## 2016-06-12 ENCOUNTER — Encounter: Payer: Self-pay | Admitting: Internal Medicine

## 2016-06-12 ENCOUNTER — Non-Acute Institutional Stay (SKILLED_NURSING_FACILITY): Payer: Medicare Other | Admitting: Internal Medicine

## 2016-06-12 DIAGNOSIS — G903 Multi-system degeneration of the autonomic nervous system: Secondary | ICD-10-CM

## 2016-06-12 DIAGNOSIS — S069X6D Unspecified intracranial injury with loss of consciousness greater than 24 hours without return to pre-existing conscious level with patient surviving, subsequent encounter: Secondary | ICD-10-CM

## 2016-06-12 DIAGNOSIS — R1310 Dysphagia, unspecified: Secondary | ICD-10-CM | POA: Diagnosis not present

## 2016-06-12 NOTE — Progress Notes (Signed)
Location:  [facility] Room Number: 195K Place of Service:  SNF (31)  Autumn Bryant. Autumn Coil, MD  No care team member to display  Extended Emergency Contact Information Primary Emergency Contact: Dalporto,Brian Address: [address] [place], [place] [address] [name] of [place] Phone: [phone] Relation: Son Secondary Emergency Contact: [name],[name]          [place], [place] Relation: Sister    Allergies: Demerol [meperidine]  Chief Complaint  Patient presents with  . Medical Management of Chronic Issues    Routine Visit    HPI: Patient is 81 y.o. female who is being seen for routine issues of hypotension, TBI and dysphagia.  Past Medical History:  Diagnosis Date  . Gout   . Hypertension     Past Surgical History:  Procedure Laterality Date  . MASTECTOMY      Allergies as of 06/12/2016      Reactions   Demerol [meperidine] Swelling      Medication List       Accurate as of 06/12/16 11:59 PM. Always use your most recent med list.          acetaminophen 500 MG tablet Commonly known as:  TYLENOL Take 1,000 mg by mouth 3 (three) times daily.   feeding supplement (PRO-STAT SUGAR FREE 64) Liqd Place 30 mLs into feeding tube 2 (two) times daily.   furosemide 20 MG tablet Commonly known as:  LASIX Take 20 mg by mouth daily.   OSMOLITE 1.2 CAL PO Initiate Osmolite 1.2 by tube  daily due to poor intake.   sterile water for irrigation Irrigate with 60 mLs as directed 3 (three) times daily.       No orders of the defined types were placed in this encounter.   Immunization History  Administered Date(s) Administered  . PPD Test 04/29/2016  . Pneumococcal Polysaccharide-23 04/15/2016  . Tdap 03/31/2016    Social History  Substance Use Topics  . Smoking status: Never Smoker  . Smokeless tobacco: Never Used  . Alcohol use No    Review of Systems  DATA OBTAINED: from  patient, nurse GENERAL:  no fevers, fatigue, appetite changes SKIN: No itching, rash HEENT: No complaint RESPIRATORY: No cough, wheezing, SOB CARDIAC: No chest pain, palpitations, lower extremity edema  GI: No abdominal pain, No N/V/D or constipation, No heartburn or reflux  GU: No dysuria, frequency or urgency, or incontinence  MUSCULOSKELETAL: No unrelieved bone/joint pain NEUROLOGIC: No headache, dizziness  PSYCHIATRIC: No overt anxiety or sadness  Vitals:   06/12/16 1110  BP: 125/79  Pulse: 85  Resp: 20  Temp: 97.9 F (36.6 C)   Body mass index is 34.69 kg/m. Physical Exam  GENERAL APPEARANCE: Alert, conversant, No acute distress  SKIN: No diaphoresis rash HEENT: Unremarkable except deep scar R cheek RESPIRATORY: Breathing is even, unlabored. Lung sounds are clear   CARDIOVASCULAR: Heart RRR no murmurs, rubs or gallops. No peripheral edema  GASTROINTESTINAL: Abdomen is soft, non-tender, not distended w/ normal bowel sounds.  GENITOURINARY: Bladder non tender, not distended  MUSCULOSKELETAL: No abnormal joints or musculature NEUROLOGIC: Cranial nerves 2-12 grossly intact. Moves all extremities PSYCHIATRIC: Mood and affect slt vague but brighter than prior, no behavioral issues  Patient Active Problem List   Diagnosis Date Noted  . Dysphagia 07/13/2016  . Anasarca 05/21/2016  . Obesity (BMI 30.0-34.9) 05/21/2016  . Closed TBI (traumatic brain injury) ([place] [facility]) 04/21/2016  .  Status post insertion of percutaneous endoscopic gastrostomy (PEG) tube ([facility]) 04/21/2016  . Facial laceration 04/21/2016  . Cheek wound, right, subsequent encounter 04/21/2016  . Urinary tract infection due to extended-spectrum beta lactamase (ESBL) producing Escherichia coli 04/21/2016  . Hypotension 04/21/2016  . Gout   . Hypertension     CMP     Component Value Date/Time   NA 145 05/27/2016   K 3.9 05/27/2016   BUN 18 05/27/2016   CREATININE 0.5 05/27/2016   AST 16 08/05/2013   ALT 9  08/05/2013    Recent Labs  03/31/16 1838 04/10/16 04/12/16 05/27/16  NA 142  --  139 145  K 3.7 3.7 4.0 3.9  BUN 17  --  20 18  CREATININE 0.7 0.6 0.5 0.5   No results for input(s): AST, ALT, ALKPHOS, BILITOT, PROT, ALBUMIN in the last 8760 hours.  Recent Labs  03/31/16 03/31/16 1838 04/10/16 04/12/16  WBC 15.0 15.0 14.0 12.5  HGB 13.2 13.2 11.4* 10.1*  HCT 39 39 35*  --   PLT 185 185 210 156   No results for input(s): CHOL, LDLCALC, TRIG in the last 8760 hours.  Invalid input(s): HCL No results found for: MICROALBUR Lab Results  Component Value Date   TSH 1.68 08/06/2013   No results found for: HGBA1C No results found for: CHOL, HDL, LDLCALC, LDLDIRECT, TRIG, CHOLHDL  Significant Diagnostic Results in last 30 days:  No results found.  Assessment and Plan  Hypotension No longer a problem; will cont to monitor  Closed TBI (traumatic brain injury) ([place] [facility]) Pt has improved remarkably; sitting up n hall n WC, talking, eating; cont supportive care   Dysphagia Pt is now only getting 2 cans per tube at night and eating more during the day; soon will go to 1 can only PT at night    [name] D. Autumn Coil, MD

## 2016-06-30 ENCOUNTER — Encounter: Payer: Self-pay | Admitting: Internal Medicine

## 2016-07-11 ENCOUNTER — Encounter: Payer: Self-pay | Admitting: Internal Medicine

## 2016-07-11 ENCOUNTER — Non-Acute Institutional Stay (SKILLED_NURSING_FACILITY): Payer: Medicare Other | Admitting: Internal Medicine

## 2016-07-11 DIAGNOSIS — M109 Gout, unspecified: Secondary | ICD-10-CM | POA: Diagnosis not present

## 2016-07-11 DIAGNOSIS — R1310 Dysphagia, unspecified: Secondary | ICD-10-CM

## 2016-07-11 DIAGNOSIS — I10 Essential (primary) hypertension: Secondary | ICD-10-CM | POA: Diagnosis not present

## 2016-07-11 NOTE — Progress Notes (Signed)
Location:  [place] Room Number: 737T Place of Service:  SNF (31)  Autumn Bryant. Autumn Coil, MD  [name], [name]  Extended Emergency Contact Information Primary Emergency Contact: [name] Address: [address]          [place], [place] [address] [name] of [place] Phone: [phone] Relation: Son Secondary Emergency Contact: [name],[name]          [place], [place] Relation: Sister    Allergies: Demerol [meperidine]  Chief Complaint  Patient presents with  . Medical Management of Chronic Issues    Routine Visit    HPI: Patient is 81 y.o. female who Who is being seen for routine issues of hypertension, dysphagia, and gout.  Past Medical History:  Diagnosis Date  . Anasarca 05/21/2016  . Closed TBI (traumatic brain injury) ([place]) 04/21/2016  . Dysphagia 07/13/2016  . Gout   . Hypertension   . Status post insertion of percutaneous endoscopic gastrostomy (PEG) tube ([place]) 04/21/2016    Past Surgical History:  Procedure Laterality Date  . MASTECTOMY      Allergies as of 07/11/2016      Reactions   Demerol [meperidine] Swelling      Medication List       Accurate as of 07/11/16 11:59 PM. Always use your most recent med list.          acetaminophen 500 MG tablet Commonly known as:  TYLENOL Take 1,000 mg by mouth 3 (three) times daily.   furosemide 20 MG tablet Commonly known as:  LASIX Take 20 mg by mouth daily.   OSMOLITE 1.2 CAL PO Initiate Osmolite 1.2 by tube  daily due to poor intake.   sterile water for irrigation Irrigate with 60 mLs as directed 3 (three) times daily.       No orders of the defined types were placed in this encounter.   Immunization History  Administered Date(s) Administered  . PPD Test 04/29/2016  . Pneumococcal Polysaccharide-23 04/15/2016  . Tdap 03/31/2016    Social History  Substance Use  Topics  . Smoking status: Never Smoker  . Smokeless tobacco: Never Used  . Alcohol use No    Review of Systems  DATA OBTAINED: from patient, nurse GENERAL:  no fevers, fatigue, appetite changes SKIN: No itching, rash HEENT: No complaint RESPIRATORY: No cough, wheezing, SOB CARDIAC: No chest pain, palpitations, lower extremity edema  GI: No abdominal pain, No N/V/D or constipation, No heartburn or reflux  GU: No dysuria, frequency or urgency, or incontinence  MUSCULOSKELETAL: No unrelieved bone/joint pain NEUROLOGIC: No headache, dizziness  PSYCHIATRIC: No overt anxiety or sadness  Vitals:   07/11/16 1048  BP: (!) 99/54  Pulse: 93  Resp: 18  Temp: 97.8 F (36.6 C)   Body mass index is 30.55 kg/m. Physical Exam  GENERAL APPEARANCE: Alert, conversant, No acute distress  SKIN: No diaphoresis rash HEENT: Unremarkable Except for deep scar on cheek RESPIRATORY: Breathing is even, unlabored. Lung sounds are clear   CARDIOVASCULAR: Heart RRR no murmurs, rubs or gallops. No peripheral edema  GASTROINTESTINAL: Abdomen is soft, non-tender, not distended w/ normal bowel sounds.  GENITOURINARY: Bladder non tender, not distended  MUSCULOSKELETAL: No abnormal joints or musculature NEUROLOGIC: Cranial nerves 2-12 grossly intact. Moves all extremities PSYCHIATRIC: Mood and affect continuing to improve, no behavioral issues  Patient Active Problem List   Diagnosis Date Noted  . Dysphagia 07/13/2016  .  Anasarca 05/21/2016  . Obesity (BMI 30.0-34.9) 05/21/2016  . Closed TBI (traumatic brain injury) ([facility]) 04/21/2016  . Status post insertion of percutaneous endoscopic gastrostomy (PEG) tube ([facility]) 04/21/2016  . Facial laceration 04/21/2016  . Cheek wound, right, subsequent encounter 04/21/2016  . Urinary tract infection due to extended-spectrum beta lactamase (ESBL) producing Escherichia coli 04/21/2016  . Hypotension 04/21/2016  . Gout   . Hypertension     CMP     Component Value  Date/Time   NA 145 05/27/2016   K 3.9 05/27/2016   BUN 18 05/27/2016   CREATININE 0.5 05/27/2016   AST 16 08/05/2013   ALT 9 08/05/2013    Recent Labs  03/31/16 1838 04/10/16 04/12/16 05/27/16  NA 142  --  139 145  K 3.7 3.7 4.0 3.9  BUN 17  --  20 18  CREATININE 0.7 0.6 0.5 0.5   No results for input(s): AST, ALT, ALKPHOS, BILITOT, PROT, ALBUMIN in the last 8760 hours.  Recent Labs  03/31/16 03/31/16 1838 04/10/16 04/12/16  WBC 15.0 15.0 14.0 12.5  HGB 13.2 13.2 11.4* 10.1*  HCT 39 39 35*  --   PLT 185 185 210 156   No results for input(s): CHOL, LDLCALC, TRIG in the last 8760 hours.  Invalid input(s): HCL No results found for: MICROALBUR Lab Results  Component Value Date   TSH 1.68 08/06/2013   No results found for: HGBA1C No results found for: CHOL, HDL, LDLCALC, LDLDIRECT, TRIG, CHOLHDL  Significant Diagnostic Results in last 30 days:  No results found.  Assessment and Plan  Hypertension Controlled; continue Lasix 20 mg by mouth daily  Dysphagia Patient is continuing to transition to eating and is doing very well; will continue to monitor  Gout Patient has had no flares and is on no medications; will continue to monitor     Shaneece Stockburger D. Autumn Coil, MD

## 2016-07-13 ENCOUNTER — Encounter: Payer: Self-pay | Admitting: Internal Medicine

## 2016-07-13 DIAGNOSIS — R1310 Dysphagia, unspecified: Secondary | ICD-10-CM

## 2016-07-13 HISTORY — DX: Dysphagia, unspecified: R13.10

## 2016-07-13 NOTE — Assessment & Plan Note (Signed)
No longer a problem; will cont to monitor

## 2016-07-13 NOTE — Assessment & Plan Note (Signed)
Pt has improved remarkably; sitting up n hall n WC, talking, eating; cont supportive care

## 2016-07-13 NOTE — Assessment & Plan Note (Signed)
Pt is now only getting 2 cans per tube at night and eating more during the day; soon will go to 1 can only PT at night

## 2016-07-13 NOTE — Assessment & Plan Note (Signed)
Swelling continues to improve; cont low dose lasix 20 mg daily

## 2016-07-13 NOTE — Assessment & Plan Note (Signed)
Pt is on no meds and has had no gout flares; will cont to monitor

## 2016-07-13 NOTE — Assessment & Plan Note (Signed)
Well controlled ;cot lasix 20 mg daily

## 2016-07-18 DIAGNOSIS — S06349D Traumatic hemorrhage of right cerebrum with loss of consciousness of unspecified duration, subsequent encounter: Secondary | ICD-10-CM | POA: Diagnosis not present

## 2016-07-18 DIAGNOSIS — M6281 Muscle weakness (generalized): Secondary | ICD-10-CM | POA: Diagnosis not present

## 2016-07-18 DIAGNOSIS — R488 Other symbolic dysfunctions: Secondary | ICD-10-CM | POA: Diagnosis not present

## 2016-07-21 DIAGNOSIS — S06349D Traumatic hemorrhage of right cerebrum with loss of consciousness of unspecified duration, subsequent encounter: Secondary | ICD-10-CM | POA: Diagnosis not present

## 2016-07-21 DIAGNOSIS — M6281 Muscle weakness (generalized): Secondary | ICD-10-CM | POA: Diagnosis not present

## 2016-07-21 DIAGNOSIS — R488 Other symbolic dysfunctions: Secondary | ICD-10-CM | POA: Diagnosis not present

## 2016-07-22 DIAGNOSIS — S06349D Traumatic hemorrhage of right cerebrum with loss of consciousness of unspecified duration, subsequent encounter: Secondary | ICD-10-CM | POA: Diagnosis not present

## 2016-07-22 DIAGNOSIS — M6281 Muscle weakness (generalized): Secondary | ICD-10-CM | POA: Diagnosis not present

## 2016-07-22 DIAGNOSIS — R488 Other symbolic dysfunctions: Secondary | ICD-10-CM | POA: Diagnosis not present

## 2016-07-23 DIAGNOSIS — R488 Other symbolic dysfunctions: Secondary | ICD-10-CM | POA: Diagnosis not present

## 2016-07-23 DIAGNOSIS — M6281 Muscle weakness (generalized): Secondary | ICD-10-CM | POA: Diagnosis not present

## 2016-07-23 DIAGNOSIS — S06349D Traumatic hemorrhage of right cerebrum with loss of consciousness of unspecified duration, subsequent encounter: Secondary | ICD-10-CM | POA: Diagnosis not present

## 2016-07-24 DIAGNOSIS — S06349D Traumatic hemorrhage of right cerebrum with loss of consciousness of unspecified duration, subsequent encounter: Secondary | ICD-10-CM | POA: Diagnosis not present

## 2016-07-24 DIAGNOSIS — M6281 Muscle weakness (generalized): Secondary | ICD-10-CM | POA: Diagnosis not present

## 2016-07-24 DIAGNOSIS — R488 Other symbolic dysfunctions: Secondary | ICD-10-CM | POA: Diagnosis not present

## 2016-07-25 DIAGNOSIS — S06349D Traumatic hemorrhage of right cerebrum with loss of consciousness of unspecified duration, subsequent encounter: Secondary | ICD-10-CM | POA: Diagnosis not present

## 2016-07-25 DIAGNOSIS — R488 Other symbolic dysfunctions: Secondary | ICD-10-CM | POA: Diagnosis not present

## 2016-07-25 DIAGNOSIS — M6281 Muscle weakness (generalized): Secondary | ICD-10-CM | POA: Diagnosis not present

## 2016-07-26 DIAGNOSIS — R488 Other symbolic dysfunctions: Secondary | ICD-10-CM | POA: Diagnosis not present

## 2016-07-26 DIAGNOSIS — M6281 Muscle weakness (generalized): Secondary | ICD-10-CM | POA: Diagnosis not present

## 2016-07-26 DIAGNOSIS — S06349D Traumatic hemorrhage of right cerebrum with loss of consciousness of unspecified duration, subsequent encounter: Secondary | ICD-10-CM | POA: Diagnosis not present

## 2016-07-27 DIAGNOSIS — S06349D Traumatic hemorrhage of right cerebrum with loss of consciousness of unspecified duration, subsequent encounter: Secondary | ICD-10-CM | POA: Diagnosis not present

## 2016-07-27 DIAGNOSIS — R488 Other symbolic dysfunctions: Secondary | ICD-10-CM | POA: Diagnosis not present

## 2016-07-27 DIAGNOSIS — M6281 Muscle weakness (generalized): Secondary | ICD-10-CM | POA: Diagnosis not present

## 2016-07-29 DIAGNOSIS — S06349D Traumatic hemorrhage of right cerebrum with loss of consciousness of unspecified duration, subsequent encounter: Secondary | ICD-10-CM | POA: Diagnosis not present

## 2016-07-29 DIAGNOSIS — R488 Other symbolic dysfunctions: Secondary | ICD-10-CM | POA: Diagnosis not present

## 2016-07-29 DIAGNOSIS — M6281 Muscle weakness (generalized): Secondary | ICD-10-CM | POA: Diagnosis not present

## 2016-07-30 DIAGNOSIS — S06349D Traumatic hemorrhage of right cerebrum with loss of consciousness of unspecified duration, subsequent encounter: Secondary | ICD-10-CM | POA: Diagnosis not present

## 2016-07-30 DIAGNOSIS — R488 Other symbolic dysfunctions: Secondary | ICD-10-CM | POA: Diagnosis not present

## 2016-07-30 DIAGNOSIS — M6281 Muscle weakness (generalized): Secondary | ICD-10-CM | POA: Diagnosis not present

## 2016-08-01 DIAGNOSIS — S06349D Traumatic hemorrhage of right cerebrum with loss of consciousness of unspecified duration, subsequent encounter: Secondary | ICD-10-CM | POA: Diagnosis not present

## 2016-08-01 DIAGNOSIS — R488 Other symbolic dysfunctions: Secondary | ICD-10-CM | POA: Diagnosis not present

## 2016-08-01 DIAGNOSIS — M6281 Muscle weakness (generalized): Secondary | ICD-10-CM | POA: Diagnosis not present

## 2016-08-02 ENCOUNTER — Non-Acute Institutional Stay (SKILLED_NURSING_FACILITY): Payer: Medicare Other

## 2016-08-02 DIAGNOSIS — Z0000 Encounter for general adult medical examination without abnormal findings: Secondary | ICD-10-CM

## 2016-08-02 NOTE — Progress Notes (Signed)
Subjective:   Autumn Bryant is a 81 y.o. female who presents for Medicare Annual (Subsequent) preventive examination at [facility]; incapacitated patient unable to answer questions appropriately.     Objective:     Vitals: BP 110/60 (BP Location: Left Arm, Patient Position: Supine)   Pulse 79   Temp 98.1 F (36.7 C) (Oral)   Ht 5\' 5"  (1.651 m)   Wt 184 lb (83.5 kg)   SpO2 95%   BMI 30.62 kg/m   Body mass index is 30.62 kg/m.   Tobacco History  Smoking Status  . Never Smoker  Smokeless Tobacco  . Never Used     Counseling given: Not Answered   Past Medical History:  Diagnosis Date  . Gout   . Hypertension    Past Surgical History:  Procedure Laterality Date  . MASTECTOMY     Family History  Problem Relation Age of Onset  . Asthma Mother   . Anesthesia problems Neg Hx   . Arthritis Neg Hx   . Cancer Neg Hx   . Cerebral palsy Neg Hx   . Clotting disorder Neg Hx   . Club foot Neg Hx   . Collagen disease Neg Hx   . Deep vein thrombosis Neg Hx   . Gait disorder Neg Hx   . Heart disease Neg Hx   . Gout Neg Hx   . Diabetes Neg Hx   . Hip dysplasia Neg Hx   . Hip fracture Neg Hx   . Hypermobility Neg Hx   . Hypertension Neg Hx   . Osteoporosis Neg Hx   . Other Neg Hx   . Stroke Neg Hx   . Thyroid disease Neg Hx   . Vasculitis Neg Hx   . Spina bifida Neg Hx   . Scoliosis Neg Hx   . Rheumatologic disease Neg Hx   . Pulmonary embolism Neg Hx    History  Sexual Activity  . Sexual activity: Not on file    Outpatient Encounter Prescriptions as of 08/02/2016  Medication Sig  . acetaminophen (TYLENOL) 500 MG tablet Take 1,000 mg by mouth 3 (three) times daily.   . furosemide (LASIX) 20 MG tablet Take 20 mg by mouth daily.  . Nutritional Supplements (OSMOLITE 1.2 CAL PO) Initiate Osmolite 1.2 by tube  daily due to poor intake.  . Water For Irrigation, Sterile (STERILE WATER FOR IRRIGATION) Irrigate with 60 mLs as directed 3 (three) times daily.   No  facility-administered encounter medications on file as of 08/02/2016.     Activities of Daily Living In your present state of health, do you have any difficulty performing the following activities: 08/02/2016  Hearing? N  Vision? N  Difficulty concentrating or making decisions? Y  Walking or climbing stairs? Y  Dressing or bathing? Y  Doing errands, shopping? Y  Preparing Food and eating ? Y  Using the Toilet? Y  In the past six months, have you accidently leaked urine? Y  Do you have problems with loss of bowel control? Y  Managing your Medications? Y  Managing your Finances? Y  Housekeeping or managing your Housekeeping? Y    No care team member to display    Assessment:     Exercise Activities and Dietary recommendations Current Exercise Habits: The patient does not participate in regular exercise at present, Exercise limited by: neurologic condition(s)  Goals    None     Fall Risk Fall Risk  08/02/2016  Falls in the past  year? Yes  Number falls in past yr: 1  Injury with Fall? Yes   Depression Screen PHQ 2/9 Scores 08/02/2016  PHQ - 2 Score 0     Cognitive Function     6CIT Screen 08/02/2016  What Year? 4 points  What month? 0 points  What time? 3 points  Count back from 20 0 points  Months in reverse 0 points  Repeat phrase 10 points  Total Score 17    Immunization History  Administered Date(s) Administered  . PPD Test 04/29/2016  . Pneumococcal Polysaccharide-23 04/15/2016  . Tdap 03/31/2016   Screening Tests Health Maintenance  Topic Date Due  . DEXA SCAN  10/02/2002  . INFLUENZA VACCINE  09/04/2016  . PNA vac Low Risk Adult (2 of 2 - PCV13) 04/15/2017  . TETANUS/TDAP  03/31/2026      Plan:    I have personally reviewed and addressed the Medicare Annual Wellness questionnaire and have noted the following in the patient's chart:  A. Medical and social history B. Use of alcohol, tobacco or illicit drugs  C. Current medications and  supplements D. Functional ability and status E.  Nutritional status F.  Physical activity G. Advance directives H. List of other physicians I.  Hospitalizations, surgeries, and ER visits in previous 12 months J.  [place] to include hearing, vision, cognitive, depression L. Referrals and appointments - none  In addition, pt is incapacitated and was unable to review and discuss1 with patient certain preventive protocols, quality metrics, and best practice recommendations. A written personalized care plan for preventive services as well as general preventive health recommendations were provided to patient.  See attached scanned questionnaire for additional information.   Signed,   Rich Reining, RN Nurse Health Advisor   Quick Notes   Health Maintenance:  PNA 13, DEXA due     Abnormal Screen: 6 CIT-17     Patient Concerns: None     Nurse Concerns: None

## 2016-08-03 DIAGNOSIS — Z23 Encounter for immunization: Secondary | ICD-10-CM | POA: Diagnosis not present

## 2016-08-03 DIAGNOSIS — M6281 Muscle weakness (generalized): Secondary | ICD-10-CM | POA: Diagnosis not present

## 2016-08-03 DIAGNOSIS — R488 Other symbolic dysfunctions: Secondary | ICD-10-CM | POA: Diagnosis not present

## 2016-08-03 DIAGNOSIS — S06349D Traumatic hemorrhage of right cerebrum with loss of consciousness of unspecified duration, subsequent encounter: Secondary | ICD-10-CM | POA: Diagnosis not present

## 2016-08-04 DIAGNOSIS — R488 Other symbolic dysfunctions: Secondary | ICD-10-CM | POA: Diagnosis not present

## 2016-08-04 DIAGNOSIS — S06349D Traumatic hemorrhage of right cerebrum with loss of consciousness of unspecified duration, subsequent encounter: Secondary | ICD-10-CM | POA: Diagnosis not present

## 2016-08-05 DIAGNOSIS — R488 Other symbolic dysfunctions: Secondary | ICD-10-CM | POA: Diagnosis not present

## 2016-08-05 DIAGNOSIS — S06349D Traumatic hemorrhage of right cerebrum with loss of consciousness of unspecified duration, subsequent encounter: Secondary | ICD-10-CM | POA: Diagnosis not present

## 2016-08-12 DIAGNOSIS — M79662 Pain in left lower leg: Secondary | ICD-10-CM | POA: Diagnosis not present

## 2016-08-12 DIAGNOSIS — M25562 Pain in left knee: Secondary | ICD-10-CM | POA: Diagnosis not present

## 2016-08-14 ENCOUNTER — Non-Acute Institutional Stay (SKILLED_NURSING_FACILITY): Payer: Medicare Other | Admitting: Internal Medicine

## 2016-08-14 ENCOUNTER — Encounter: Payer: Self-pay | Admitting: Internal Medicine

## 2016-08-14 DIAGNOSIS — S01401D Unspecified open wound of right cheek and temporomandibular area, subsequent encounter: Secondary | ICD-10-CM | POA: Diagnosis not present

## 2016-08-14 DIAGNOSIS — S069X6D Unspecified intracranial injury with loss of consciousness greater than 24 hours without return to pre-existing conscious level with patient surviving, subsequent encounter: Secondary | ICD-10-CM

## 2016-08-14 DIAGNOSIS — R601 Generalized edema: Secondary | ICD-10-CM | POA: Diagnosis not present

## 2016-08-14 NOTE — Progress Notes (Signed)
Location:  [place] [facility] Room Number: 211D Place of Service:  SNF (31)  [name], MD  [name], [name]  Extended Emergency Contact Information Primary Emergency Contact: [name] Address: [address]          [place], [place] [address] [name] of [place] Phone: [phone] Relation: Son Secondary Emergency Contact: [name],[name]          [place], [place] Relation: Sister    Allergies: Demerol [meperidine]  Chief Complaint  Patient presents with  . Medical Management of Chronic Issues    routine visit    HPI: Patient is 81 y.o. female who Is being seen for routine issues of laceration which left cheek, anasarca, and status post traumatic brain injury.  Past Medical History:  Diagnosis Date  . Anasarca 05/21/2016  . Closed TBI (traumatic brain injury) ([place]) 04/21/2016  . Dysphagia 07/13/2016  . Gout   . Hypertension   . Status post insertion of percutaneous endoscopic gastrostomy (PEG) tube ([place]) 04/21/2016    Past Surgical History:  Procedure Laterality Date  . MASTECTOMY      Allergies as of 08/14/2016      Reactions   Demerol [meperidine] Swelling      Medication List       Accurate as of 08/14/16 11:59 PM. Always use your most recent med list.          acetaminophen 500 MG tablet Commonly known as:  TYLENOL Take 1,000 mg by mouth 3 (three) times daily.   furosemide 20 MG tablet Commonly known as:  LASIX Take 20 mg by mouth daily.   OSMOLITE 1.2 CAL PO Initiate Osmolite 1.2 by tube  daily due to poor intake.   sterile water for irrigation Irrigate with 60 mLs as directed 3 (three) times daily.       No orders of the defined types were placed in this encounter.   Immunization History  Administered Date(s) Administered  . PPD Test 04/29/2016  . Pneumococcal Polysaccharide-23 04/15/2016  . Tdap  03/31/2016    Social History  Substance Use Topics  . Smoking status: Never Smoker  . Smokeless tobacco: Never Used  . Alcohol use No    Review of Systems  DATA OBTAINED: from patient,nurse GENERAL:  no fevers, fatigue, appetite changes SKIN: No itching, rash HEENT: No complaint RESPIRATORY: No cough, wheezing, SOB CARDIAC: No chest pain, palpitations, lower extremity edema  GI: No abdominal pain, No N/V/D or constipation, No heartburn or reflux  GU: No dysuria, frequency or urgency, or incontinence  MUSCULOSKELETAL: No unrelieved bone/joint pain NEUROLOGIC: No headache, dizziness  PSYCHIATRIC: No overt anxiety or sadness  Vitals:   08/14/16 1110  BP: 124/78  Pulse: 76  Resp: 18  Temp: (!) 97.5 F (36.4 C)  SpO2: 98%   Body mass index is 29.79 kg/m. Physical Exam  GENERAL APPEARANCE: Alert, conversant, No acute distress  SKIN: No diaphoresis rash HEENT: UnremarkableExcept for deep scar left cheek RESPIRATORY: Breathing is even, unlabored. Lung sounds are clear   CARDIOVASCULAR: Heart RRR no murmurs, rubs or gallops. No peripheral edema  GASTROINTESTINAL: Abdomen is soft, non-tender, not distended w/ normal bowel sounds.  GENITOURINARY: Bladder non tender, not distended  MUSCULOSKELETAL: No abnormal joints or musculature NEUROLOGIC: Cranial nerves 2-12 grossly intact. Moves all extremities PSYCHIATRIC: Mood and affect appropriate to situation, no behavioral issues  Patient Active Problem List   Diagnosis  Date Noted  . Dysphagia 07/13/2016  . Anasarca 05/21/2016  . Obesity (BMI 30.0-34.9) 05/21/2016  . Closed TBI (traumatic brain injury) ([place]) 04/21/2016  . Status post insertion of percutaneous endoscopic gastrostomy (PEG) tube ([place]) 04/21/2016  . Facial laceration 04/21/2016  . Cheek wound, right, subsequent encounter 04/21/2016  . Urinary tract infection due to extended-spectrum beta lactamase (ESBL) producing Escherichia coli 04/21/2016  . Hypotension  04/21/2016  . Gout   . Hypertension     CMP     Component Value Date/Time   NA 145 05/27/2016   K 3.9 05/27/2016   BUN 18 05/27/2016   CREATININE 0.5 05/27/2016   AST 16 08/05/2013   ALT 9 08/05/2013    Recent Labs  03/31/16 1838 04/10/16 04/12/16 05/27/16  NA 142  --  139 145  K 3.7 3.7 4.0 3.9  BUN 17  --  20 18  CREATININE 0.7 0.6 0.5 0.5   No results for input(s): AST, ALT, ALKPHOS, BILITOT, PROT, ALBUMIN in the last 8760 hours.  Recent Labs  03/31/16 03/31/16 1838 04/10/16 04/12/16  WBC 15.0 15.0 14.0 12.5  HGB 13.2 13.2 11.4* 10.1*  HCT 39 39 35*  --   PLT 185 185 210 156   No results for input(s): CHOL, LDLCALC, TRIG in the last 8760 hours.  Invalid input(s): HCL No results found for: MICROALBUR Lab Results  Component Value Date   TSH 1.68 08/06/2013   No results found for: HGBA1C No results found for: CHOL, HDL, LDLCALC, LDLDIRECT, TRIG, CHOLHDL  Significant Diagnostic Results in last 30 days:  No results found.  Assessment and Plan  Cheek wound, right, subsequent encounter Completely healed; there is some scarring; will monitor  Anasarca Resolved; patient looks really good; plan to continue Lasix 20 mg by mouth daily  Closed TBI (traumatic brain injury) [facility]) Patient continues to do well; out of room daily, talking eating interacting; plan to continue supportive care    Webb Silversmith D. [name], MD

## 2016-08-18 ENCOUNTER — Encounter: Payer: Self-pay | Admitting: Internal Medicine

## 2016-08-18 NOTE — Assessment & Plan Note (Signed)
Patient is continuing to transition to eating and is doing very well; will continue to monitor

## 2016-08-18 NOTE — Assessment & Plan Note (Signed)
Patient has had no flares and is on no medications; will continue to monitor

## 2016-08-18 NOTE — Assessment & Plan Note (Signed)
Controlled; continue Lasix 20 mg by mouth daily

## 2016-09-16 ENCOUNTER — Non-Acute Institutional Stay (SKILLED_NURSING_FACILITY): Payer: Medicare Other | Admitting: Internal Medicine

## 2016-09-16 ENCOUNTER — Encounter: Payer: Self-pay | Admitting: Internal Medicine

## 2016-09-16 DIAGNOSIS — E669 Obesity, unspecified: Secondary | ICD-10-CM

## 2016-09-16 DIAGNOSIS — I10 Essential (primary) hypertension: Secondary | ICD-10-CM

## 2016-09-16 DIAGNOSIS — M109 Gout, unspecified: Secondary | ICD-10-CM

## 2016-09-16 NOTE — Progress Notes (Signed)
Location:  [place] Room Number: [place]:  SNF [phone])  [name], MD  [name], [name]  Extended Emergency Contact Information Primary Emergency Contact: [name] Address: [address]          [place], [place] [address] [name] of [place] Phone: [phone] Relation: Son Secondary Emergency Contact: [name],[name]          [place], [place] Relation: Sister    Allergies: Demerol [meperidine]  Chief Complaint  Patient presents with  . Medical Management of Chronic Issues    routine visit    HPI: Patient is 81 y.o. female who Is being seen for routine issues of hypertension, gout, and obesity.  Past Medical History:  Diagnosis Date  . Anasarca 05/21/2016  . Closed TBI (traumatic brain injury) ([place]) 04/21/2016  . Dysphagia 07/13/2016  . Gout   . Hypertension   . Status post insertion of percutaneous endoscopic gastrostomy (PEG) tube ([place]) 04/21/2016    Past Surgical History:  Procedure Laterality Date  . MASTECTOMY      Allergies as of 09/16/2016      Reactions   Demerol [meperidine] Swelling      Medication List       Accurate as of 09/16/16 11:59 PM. Always use your most recent med list.          acetaminophen 500 MG tablet Commonly known as:  TYLENOL Take 1,000 mg by mouth 3 (three) times daily.   furosemide 20 MG tablet Commonly known as:  LASIX Take 20 mg by mouth daily.   OSMOLITE 1.2 CAL PO Initiate Osmolite 1.2 by tube  daily due to poor intake.   sterile water for irrigation Irrigate with 60 mLs as directed 3 (three) times daily.       No orders of the defined types were placed in this encounter.   Immunization History  Administered Date(s) Administered  . PPD Test 04/29/2016  . Pneumococcal Polysaccharide-23 04/15/2016  . Tdap 03/31/2016    Social History  Substance Use Topics    . Smoking status: Never Smoker  . Smokeless tobacco: Never Used  . Alcohol use No    Review of Systems  DATA OBTAINED: from patient, nurse GENERAL:  no fevers, fatigue, appetite changes SKIN: No itching, rash HEENT: No complaint RESPIRATORY: No cough, wheezing, SOB CARDIAC: No chest pain, palpitations, lower extremity edema  GI: No abdominal pain, No N/V/D or constipation, No heartburn or reflux  GU: No dysuria, frequency or urgency, or incontinence  MUSCULOSKELETAL: No unrelieved bone/joint pain NEUROLOGIC: No headache, dizziness  PSYCHIATRIC: No overt anxiety or sadness  Vitals:   09/16/16 1557  BP: 119/71  Pulse: 80  Resp: 18  Temp: 97.6 F (36.4 C)   Body mass index is 29.62 kg/m. Physical Exam  GENERAL APPEARANCE: Alert, conversant, No acute distress  SKIN: No diaphoresis rash HEENT: UnremarkableExcept for large scar right cheek RESPIRATORY: Breathing is even, unlabored. Lung sounds are clear   CARDIOVASCULAR: Heart RRR no murmurs, rubs or gallops. No peripheral edema  GASTROINTESTINAL: Abdomen is soft, non-tender, not distended w/ normal bowel sounds.  GENITOURINARY: Bladder non tender, not distended  MUSCULOSKELETAL: No abnormal joints or musculature NEUROLOGIC: Cranial nerves 2-12 grossly intact. Moves all extremities PSYCHIATRIC: Mood and affect appropriate to situation, no behavioral issues  Physical exam has not changed since prior examination.  Patient Active Problem List   Diagnosis  Date Noted  . Dysphagia 07/13/2016  . Anasarca 05/21/2016  . Obesity (BMI 30.0-34.9) 05/21/2016  . Closed TBI (traumatic brain injury) ([place]) 04/21/2016  . Status post insertion of percutaneous endoscopic gastrostomy (PEG) tube ([place]) 04/21/2016  . Facial laceration 04/21/2016  . Cheek wound, right, subsequent encounter 04/21/2016  . Urinary tract infection due to extended-spectrum beta lactamase (ESBL) producing Escherichia coli 04/21/2016  . Hypotension 04/21/2016  .  Gout   . Hypertension     CMP     Component Value Date/Time   NA 145 05/27/2016   K 3.9 05/27/2016   BUN 18 05/27/2016   CREATININE 0.5 05/27/2016   AST 16 08/05/2013   ALT 9 08/05/2013    Recent Labs  03/31/16 1838 04/10/16 04/12/16 05/27/16  NA 142  --  139 145  K 3.7 3.7 4.0 3.9  BUN 17  --  20 18  CREATININE 0.7 0.6 0.5 0.5   No results for input(s): AST, ALT, ALKPHOS, BILITOT, PROT, ALBUMIN in the last 8760 hours.  Recent Labs  03/31/16 03/31/16 1838 04/10/16 04/12/16  WBC 15.0 15.0 14.0 12.5  HGB 13.2 13.2 11.4* 10.1*  HCT 39 39 35*  --   PLT 185 185 210 156   No results for input(s): CHOL, LDLCALC, TRIG in the last 8760 hours.  Invalid input(s): HCL No results found for: MICROALBUR Lab Results  Component Value Date   TSH 1.68 08/06/2013   No results found for: HGBA1C No results found for: CHOL, HDL, LDLCALC, LDLDIRECT, TRIG, CHOLHDL  Significant Diagnostic Results in last 30 days:  No results found.  Assessment and Plan  Hypertension Controlled; plan to continue Lasix 20 mg by mouth daily  Gout Patient continues without flares and is on no medications; will continue to monitor  Obesity (BMI 30.0-34.9) Patient's PMI prior was 34.57; patient's BMI now is 29.6; this is desirable; will continue to monitor     Inocencio Homes, MD

## 2016-09-19 ENCOUNTER — Encounter: Payer: Self-pay | Admitting: Internal Medicine

## 2016-09-19 NOTE — Assessment & Plan Note (Signed)
Patient continues to do well; out of room daily, talking eating interacting; plan to continue supportive care

## 2016-09-19 NOTE — Assessment & Plan Note (Signed)
Completely healed; there is some scarring; will monitor

## 2016-09-19 NOTE — Assessment & Plan Note (Signed)
Resolved; patient looks really good; plan to continue Lasix 20 mg by mouth daily

## 2016-09-28 NOTE — Assessment & Plan Note (Signed)
Patient's PMI prior was 34.57; patient's BMI now is 29.6; this is desirable; will continue to monitor

## 2016-09-28 NOTE — Assessment & Plan Note (Signed)
Controlled; plan to continue Lasix 20 mg by mouth daily

## 2016-09-28 NOTE — Assessment & Plan Note (Signed)
Patient continues without flares and is on no medications; will continue to monitor

## 2016-10-11 ENCOUNTER — Encounter: Payer: Self-pay | Admitting: Internal Medicine

## 2016-10-11 ENCOUNTER — Non-Acute Institutional Stay (SKILLED_NURSING_FACILITY): Payer: Medicare Other | Admitting: Internal Medicine

## 2016-10-11 DIAGNOSIS — S069X6D Unspecified intracranial injury with loss of consciousness greater than 24 hours without return to pre-existing conscious level with patient surviving, subsequent encounter: Secondary | ICD-10-CM

## 2016-10-11 DIAGNOSIS — F419 Anxiety disorder, unspecified: Secondary | ICD-10-CM

## 2016-10-11 DIAGNOSIS — I10 Essential (primary) hypertension: Secondary | ICD-10-CM

## 2016-10-11 NOTE — Progress Notes (Signed)
Location:  [facility] Room Number: 211D Place of Service:  SNF (31)  [name], MD  [name], [name]  Extended Emergency Contact Information Primary Emergency Contact: [name] Address: [address]          [place], [place] [address] [name] of [place] Phone: [phone] Relation: Son Secondary Emergency Contact: [name],[name]          [place], [place] Relation: Sister    Allergies: Demerol [meperidine]  Chief Complaint  Patient presents with  . Medical Management of Chronic Issues    routine exam    HPI: Patient is 81 y.o. female who Is being seen for routine issues of hypertension, traumatic brain injury, and anxiety.  Past Medical History:  Diagnosis Date  . Anasarca 05/21/2016  . Closed TBI (traumatic brain injury) ([facility]) 04/21/2016  . Dysphagia 07/13/2016  . Gout   . Hypertension   . Status post insertion of percutaneous endoscopic gastrostomy (PEG) tube ([facility]) 04/21/2016    Past Surgical History:  Procedure Laterality Date  . MASTECTOMY      Allergies as of 10/11/2016      Reactions   Demerol [meperidine] Swelling      Medication List       Accurate as of 10/11/16 11:59 PM. Always use your most recent med list.          acetaminophen 500 MG tablet Commonly known as:  TYLENOL Take 1,000 mg by mouth 3 (three) times daily.   ALPRAZolam 0.25 MG tablet Commonly known as:  XANAX Take 0.25 mg by mouth. Take one tablet at bedtime for anxiety/agitation   furosemide 20 MG tablet Commonly known as:  LASIX Take 20 mg by mouth daily.   OSMOLITE 1.2 CAL PO Initiate Osmolite 1.2 by tube  daily due to poor intake.   sterile water for irrigation Irrigate with 60 mLs as directed 3 (three) times daily.       Meds ordered this encounter  Medications  . ALPRAZolam (XANAX) 0.25 MG tablet    Sig: Take 0.25 mg by mouth.  Take one tablet at bedtime for anxiety/agitation    Immunization History  Administered Date(s) Administered  . PPD Test 04/29/2016  . Pneumococcal Polysaccharide-23 04/15/2016  . Tdap 03/31/2016    Social History  Substance Use Topics  . Smoking status: Never Smoker  . Smokeless tobacco: Never Used  . Alcohol use No    Review of Systems  DATA OBTAINED: from patient, nurse GENERAL:  no fevers, fatigue, appetite changes SKIN: No itching, rash HEENT: No complaint RESPIRATORY: No cough, wheezing, SOB CARDIAC: No chest pain, palpitations, lower extremity edema  GI: No abdominal pain, No N/V/D or constipation, No heartburn or reflux  GU: No dysuria, frequency or urgency, or incontinence  MUSCULOSKELETAL: No unrelieved bone/joint pain NEUROLOGIC: No headache, dizziness  PSYCHIATRIC: No overt anxiety or sadness  Vitals:   10/11/16 1330  BP: 102/72  Pulse: 82  Resp: 18  Temp: (!) 97.3 F (36.3 C)   Body mass index is 29.72 kg/m. Physical Exam  GENERAL APPEARANCE: Alert, conversant, No acute distress  SKIN: No diaphoresis rash HEENT: Unremarkable Except deep scar left cheek RESPIRATORY: Breathing is even, unlabored. Lung sounds are clear   CARDIOVASCULAR: Heart RRR no murmurs, rubs or gallops. No peripheral edema  GASTROINTESTINAL: Abdomen is soft, non-tender, not distended w/ normal bowel sounds.  GENITOURINARY: Bladder non tender, not  distended  MUSCULOSKELETAL: No abnormal joints or musculature NEUROLOGIC: Cranial nerves 2-12 grossly intact. Moves all extremities PSYCHIATRIC: Mood and affect appropriate to situation, no behavioral issues  Physical examination has not changed since prior visit.  Patient Active Problem List   Diagnosis Date Noted  . Anxiety 11/03/2016  . Dysphagia 07/13/2016  . Anasarca 05/21/2016  . Obesity (BMI 30.0-34.9) 05/21/2016  . Closed TBI (traumatic brain injury) ([facility]) 04/21/2016  . Status post insertion of percutaneous endoscopic  gastrostomy (PEG) tube ([facility]) 04/21/2016  . Facial laceration 04/21/2016  . Cheek wound, right, subsequent encounter 04/21/2016  . Urinary tract infection due to extended-spectrum beta lactamase (ESBL) producing Escherichia coli 04/21/2016  . Hypotension 04/21/2016  . Gout   . Hypertension     CMP     Component Value Date/Time   NA 143 10/21/2016   K 3.5 10/21/2016   BUN 13 10/21/2016   CREATININE 0.5 10/21/2016   AST 16 08/05/2013   ALT 9 08/05/2013    Recent Labs  04/12/16 05/27/16 10/21/16  NA 139 145 143  K 4.0 3.9 3.5  BUN 20 18 13   CREATININE 0.5 0.5 0.5   No results for input(s): AST, ALT, ALKPHOS, BILITOT, PROT, ALBUMIN in the last 8760 hours.  Recent Labs  03/31/16 03/31/16 1838 04/10/16 04/12/16  WBC 15.0 15.0 14.0 12.5  HGB 13.2 13.2 11.4* 10.1*  HCT 39 39 35*  --   PLT 185 185 210 156   No results for input(s): CHOL, LDLCALC, TRIG in the last 8760 hours.  Invalid input(s): HCL No results found for: MICROALBUR Lab Results  Component Value Date   TSH 1.68 08/06/2013   No results found for: HGBA1C No results found for: CHOL, HDL, LDLCALC, LDLDIRECT, TRIG, CHOLHDL  Significant Diagnostic Results in last 30 days:  No results found.  Assessment and Plan  Hypertension Continues controlled; continue Lasix 20 mg by mouth daily  Closed TBI (traumatic brain injury) ([facility]) Patient continues to be stable, out of room interacting doing very well; we'll continue supportive care  Anxiety Stable; continue Xanax 0.25 mg by mouth daily at bedtime    Webb Silversmith D. [name], MD

## 2016-10-17 DIAGNOSIS — F329 Major depressive disorder, single episode, unspecified: Secondary | ICD-10-CM | POA: Diagnosis not present

## 2016-10-17 DIAGNOSIS — F419 Anxiety disorder, unspecified: Secondary | ICD-10-CM | POA: Diagnosis not present

## 2016-10-17 DIAGNOSIS — F4320 Adjustment disorder, unspecified: Secondary | ICD-10-CM | POA: Diagnosis not present

## 2016-10-17 DIAGNOSIS — I10 Essential (primary) hypertension: Secondary | ICD-10-CM | POA: Diagnosis not present

## 2016-10-17 DIAGNOSIS — R1312 Dysphagia, oropharyngeal phase: Secondary | ICD-10-CM | POA: Diagnosis not present

## 2016-10-21 LAB — BASIC METABOLIC PANEL
BUN: 13 (ref 4–21)
CREATININE: 0.5 (ref 0.5–1.1)
GLUCOSE: 130
POTASSIUM: 3.5 (ref 3.4–5.3)
Sodium: 143 (ref 137–147)

## 2016-10-24 ENCOUNTER — Other Ambulatory Visit: Payer: Self-pay

## 2016-10-30 DIAGNOSIS — F4320 Adjustment disorder, unspecified: Secondary | ICD-10-CM | POA: Diagnosis not present

## 2016-10-30 DIAGNOSIS — F419 Anxiety disorder, unspecified: Secondary | ICD-10-CM | POA: Diagnosis not present

## 2016-10-30 DIAGNOSIS — F329 Major depressive disorder, single episode, unspecified: Secondary | ICD-10-CM | POA: Diagnosis not present

## 2016-11-03 ENCOUNTER — Encounter: Payer: Self-pay | Admitting: Internal Medicine

## 2016-11-03 DIAGNOSIS — F419 Anxiety disorder, unspecified: Secondary | ICD-10-CM | POA: Insufficient documentation

## 2016-11-03 HISTORY — DX: Anxiety disorder, unspecified: F41.9

## 2016-11-03 NOTE — Assessment & Plan Note (Signed)
Continues controlled; continue Lasix 20 mg by mouth daily

## 2016-11-03 NOTE — Assessment & Plan Note (Signed)
Stable; continue Xanax 0.25 mg by mouth daily at bedtime

## 2016-11-03 NOTE — Assessment & Plan Note (Signed)
Patient continues to be stable, out of room interacting doing very well; we'll continue supportive care

## 2016-11-05 DIAGNOSIS — R293 Abnormal posture: Secondary | ICD-10-CM | POA: Diagnosis not present

## 2016-11-05 DIAGNOSIS — Z9181 History of falling: Secondary | ICD-10-CM | POA: Diagnosis not present

## 2016-11-06 DIAGNOSIS — Z9181 History of falling: Secondary | ICD-10-CM | POA: Diagnosis not present

## 2016-11-06 DIAGNOSIS — R293 Abnormal posture: Secondary | ICD-10-CM | POA: Diagnosis not present

## 2016-11-07 DIAGNOSIS — R293 Abnormal posture: Secondary | ICD-10-CM | POA: Diagnosis not present

## 2016-11-07 DIAGNOSIS — Z9181 History of falling: Secondary | ICD-10-CM | POA: Diagnosis not present

## 2016-11-08 ENCOUNTER — Non-Acute Institutional Stay (SKILLED_NURSING_FACILITY): Payer: Medicare Other | Admitting: Internal Medicine

## 2016-11-08 ENCOUNTER — Encounter: Payer: Self-pay | Admitting: Internal Medicine

## 2016-11-08 DIAGNOSIS — R1310 Dysphagia, unspecified: Secondary | ICD-10-CM | POA: Diagnosis not present

## 2016-11-08 DIAGNOSIS — E669 Obesity, unspecified: Secondary | ICD-10-CM

## 2016-11-08 DIAGNOSIS — R601 Generalized edema: Secondary | ICD-10-CM

## 2016-11-08 NOTE — Progress Notes (Signed)
Location:  [facility] Room Number: 211D Place of Service:  SNF (31)  Autumn Duos, MD  Patient Care Team: Autumn Duos, [name]  Extended Emergency Contact Information Primary Emergency Contact: Autumn Bryant Address: [address]          [place], [place] [address] Autumn Bryant of [place] Phone: [phone] Relation: Son Secondary Emergency Contact: Autumn,Bryant          [place], [place] Relation: Sister    Allergies: Demerol [meperidine]  Chief Complaint  Patient presents with  . Medical Management of Chronic Issues    Routine visit    HPI: Patient is 81 y.o. female who Is being seen for routine issues of dysphasia, anasarca, and obesity.  Past Medical History:  Diagnosis Date  . Anasarca 05/21/2016  . Anxiety 11/03/2016  . Closed TBI (traumatic brain injury) ([facility]) 04/21/2016  . Dysphagia 07/13/2016  . Gout   . Hypertension   . Obesity (BMI 30.0-34.9) 05/21/2016  . Status post insertion of percutaneous endoscopic gastrostomy (PEG) tube ([facility]) 04/21/2016    Past Surgical History:  Procedure Laterality Date  . MASTECTOMY      Allergies as of 11/08/2016      Reactions   Demerol [meperidine] Swelling      Medication List       Accurate as of 11/08/16 11:59 PM. Always use your most recent med list.          acetaminophen 500 MG tablet Commonly known as:  TYLENOL Take 1,000 mg by mouth 3 (three) times daily.   ALPRAZolam 0.25 MG tablet Commonly known as:  XANAX Take 0.25 mg by mouth. Take one tablet at bedtime for anxiety/agitation   escitalopram 5 MG tablet Commonly known as:  LEXAPRO Take 5 mg by mouth. Take one tablet daily for depression   furosemide 20 MG tablet Commonly known as:  LASIX Take 20 mg by mouth daily.   OSMOLITE 1.2 CAL PO Initiate Osmolite 1.2 by tube  daily due to poor intake.   sterile water for irrigation Irrigate with 60  mLs as directed 3 (three) times daily.       Meds ordered this encounter  Medications  . escitalopram (LEXAPRO) 5 MG tablet    Sig: Take 5 mg by mouth. Take one tablet daily for depression    Immunization History  Administered Date(s) Administered  . PPD Test 04/29/2016  . Pneumococcal Polysaccharide-23 04/15/2016  . Tdap 03/31/2016    Social History  Substance Use Topics  . Smoking status: Never Smoker  . Smokeless tobacco: Never Used  . Alcohol use No    Review of Systems  DATA OBTAINED: from patient, nurse GENERAL:  no fevers, fatigue, appetite changes SKIN: No itching, rash HEENT: No complaint RESPIRATORY: No cough, wheezing, SOB CARDIAC: No chest pain, palpitations, lower extremity edema  GI: No abdominal pain, No N/V/D or constipation, No heartburn or reflux  GU: No dysuria, frequency or urgency, or incontinence  MUSCULOSKELETAL: No unrelieved bone/joint pain NEUROLOGIC: No headache, dizziness  PSYCHIATRIC: No overt anxiety or sadness  Vitals:   11/08/16 1459  BP: 128/62  Pulse: 78  Resp: 20  Temp: 98 F (36.7 C)   Body mass index is 28.29 kg/m. Physical Exam  GENERAL APPEARANCE: Alert, conversant, No acute distress  SKIN: No diaphoresis rash HEENT: UnremarkableExcept for deep scar right cheek RESPIRATORY: Breathing is even, unlabored. Lung sounds are clear   CARDIOVASCULAR:  Heart RRR no murmurs, rubs or gallops. No peripheral edema  GASTROINTESTINAL: Abdomen is soft, non-tender, not distended w/ normal bowel sounds.  GENITOURINARY: Bladder non tender, not distended  MUSCULOSKELETAL: No abnormal joints or musculature NEUROLOGIC: Cranial nerves 2-12 grossly intact. Moves all extremities PSYCHIATRIC: Mood and affect appropriate to situation, no behavioral issues  Patient Active Problem List   Diagnosis Date Noted  . Anxiety 11/03/2016  . Dysphagia 07/13/2016  . Anasarca 05/21/2016  . Obesity (BMI 30.0-34.9) 05/21/2016  . Closed TBI (traumatic  brain injury) ([facility]) 04/21/2016  . Status post insertion of percutaneous endoscopic gastrostomy (PEG) tube ([facility]) 04/21/2016  . Facial laceration 04/21/2016  . Cheek wound, right, subsequent encounter 04/21/2016  . Urinary tract infection due to extended-spectrum beta lactamase (ESBL) producing Escherichia coli 04/21/2016  . Hypotension 04/21/2016  . Gout   . Hypertension     CMP     Component Value Date/Time   NA 143 10/21/2016   K 3.5 10/21/2016   BUN 13 10/21/2016   CREATININE 0.5 10/21/2016   AST 16 08/05/2013   ALT 9 08/05/2013    Recent Labs  04/12/16 05/27/16 10/21/16  NA 139 145 143  K 4.0 3.9 3.5  BUN 20 18 13   CREATININE 0.5 0.5 0.5   No results for input(s): AST, ALT, ALKPHOS, BILITOT, PROT, ALBUMIN in the last 8760 hours.  Recent Labs  03/31/16 03/31/16 1838 04/10/16 04/12/16  WBC 15.0 15.0 14.0 12.5  HGB 13.2 13.2 11.4* 10.1*  HCT 39 39 35*  --   PLT 185 185 210 156   No results for input(s): CHOL, LDLCALC, TRIG in the last 8760 hours.  Invalid input(s): HCL No results found for: MICROALBUR Lab Results  Component Value Date   TSH 1.68 08/06/2013   No results found for: HGBA1C No results found for: CHOL, HDL, LDLCALC, LDLDIRECT, TRIG, CHOLHDL  Significant Diagnostic Results in last 30 days:  No results found.  Assessment and Plan  Dysphagia Patient had a period where her weight started to fall off so her tube feedings were increased again to twice a day and patient is now eating more food and gaining weight; will continue to monitor  Anasarca Patient continues without edema; plan to continue Lasix 20 mg by mouth daily  Obesity (BMI 30.0-34.9) Patient is appropriate weight maybe a little bit overweight; watching eating and nutritional parameters; will continue to monitor    Autumn Pelot D. [name], MD

## 2016-11-09 DIAGNOSIS — R293 Abnormal posture: Secondary | ICD-10-CM | POA: Diagnosis not present

## 2016-11-09 DIAGNOSIS — Z9181 History of falling: Secondary | ICD-10-CM | POA: Diagnosis not present

## 2016-11-10 ENCOUNTER — Encounter: Payer: Self-pay | Admitting: Internal Medicine

## 2016-11-10 NOTE — Assessment & Plan Note (Signed)
Patient is appropriate weight maybe a little bit overweight; watching eating and nutritional parameters; will continue to monitor

## 2016-11-10 NOTE — Assessment & Plan Note (Signed)
Patient continues without edema; plan to continue Lasix 20 mg by mouth daily

## 2016-11-10 NOTE — Assessment & Plan Note (Signed)
Patient had a period where her weight started to fall off so her tube feedings were increased again to twice a day and patient is now eating more food and gaining weight; will continue to monitor

## 2016-11-11 DIAGNOSIS — Z9181 History of falling: Secondary | ICD-10-CM | POA: Diagnosis not present

## 2016-11-11 DIAGNOSIS — R293 Abnormal posture: Secondary | ICD-10-CM | POA: Diagnosis not present

## 2016-11-12 DIAGNOSIS — R293 Abnormal posture: Secondary | ICD-10-CM | POA: Diagnosis not present

## 2016-11-12 DIAGNOSIS — Z9181 History of falling: Secondary | ICD-10-CM | POA: Diagnosis not present

## 2016-11-13 DIAGNOSIS — R293 Abnormal posture: Secondary | ICD-10-CM | POA: Diagnosis not present

## 2016-11-13 DIAGNOSIS — Z9181 History of falling: Secondary | ICD-10-CM | POA: Diagnosis not present

## 2016-11-25 DIAGNOSIS — F419 Anxiety disorder, unspecified: Secondary | ICD-10-CM | POA: Diagnosis not present

## 2016-11-25 DIAGNOSIS — F329 Major depressive disorder, single episode, unspecified: Secondary | ICD-10-CM | POA: Diagnosis not present

## 2016-11-26 ENCOUNTER — Encounter: Payer: Self-pay | Admitting: Internal Medicine

## 2016-11-26 ENCOUNTER — Non-Acute Institutional Stay (SKILLED_NURSING_FACILITY): Payer: Medicare Other | Admitting: Internal Medicine

## 2016-11-26 DIAGNOSIS — R600 Localized edema: Secondary | ICD-10-CM | POA: Diagnosis not present

## 2016-11-26 DIAGNOSIS — M7989 Other specified soft tissue disorders: Secondary | ICD-10-CM

## 2016-11-26 DIAGNOSIS — M79604 Pain in right leg: Secondary | ICD-10-CM

## 2016-11-26 DIAGNOSIS — M79605 Pain in left leg: Secondary | ICD-10-CM

## 2016-11-26 NOTE — Progress Notes (Signed)
Location:  [facility] Room Number: [facility]:  SNF [phone])  [name], MD  [name], [name]  Extended Emergency Contact Information Primary Emergency Contact: [name] Address: [address]          [place], [place] [address] [name] of [place] Phone: [phone] Relation: Son Secondary Emergency Contact: [name],[name]          [place], [place] Relation: Sister    Allergies: Demerol [meperidine]  Chief Complaint  Patient presents with  . Acute Visit    bilateral lower legs swelling and pain    HPI: Patient is 81 y.o. female who nursing asked me to see today for bilateral lower extremity pain and swelling. Nursing had noted today that it has gotten much worse, patient says this been going on for several weeks.It is not made worse by walking and not made better by rest. Patient has had no chest pain or shortness of breath. Patient has had no fever or redness to the legs.  Past Medical History:  Diagnosis Date  . Anasarca 05/21/2016  . Anxiety 11/03/2016  . Closed TBI (traumatic brain injury) ([facility]) 04/21/2016  . Dysphagia 07/13/2016  . Gout   . Hypertension   . Obesity (BMI 30.0-34.9) 05/21/2016  . Status post insertion of percutaneous endoscopic gastrostomy (PEG) tube ([facility]) 04/21/2016    Past Surgical History:  Procedure Laterality Date  . MASTECTOMY      Allergies as of 11/26/2016      Reactions   Demerol [meperidine] Swelling      Medication List       Accurate as of 11/26/16  3:38 PM. Always use your most recent med list.          acetaminophen 500 MG tablet Commonly known as:  TYLENOL Take 1,000 mg by mouth 3 (three) times daily.   ALPRAZolam 0.25 MG tablet Commonly known as:  XANAX Take 0.25 mg by mouth. Take one tablet at bedtime for anxiety/agitation   escitalopram 5 MG tablet Commonly known as:   LEXAPRO Take 5 mg by mouth. Take one tablet daily for depression   furosemide 20 MG tablet Commonly known as:  LASIX Take 20 mg by mouth daily.   OSMOLITE 1.2 CAL PO Initiate Osmolite 1.2 by tube  daily due to poor intake.   sterile water for irrigation Irrigate with 60 mLs as directed 3 (three) times daily.       No orders of the defined types were placed in this encounter.   Immunization History  Administered Date(s) Administered  . PPD Test 04/29/2016  . Pneumococcal Polysaccharide-23 04/15/2016  . Tdap 03/31/2016    Social History  Substance Use Topics  . Smoking status: Never Smoker  . Smokeless tobacco: Never Used  . Alcohol use No    Review of Systems  DATA OBTAINED: from patient, nurse GENERAL:  no fevers, fatigue, appetite changes SKIN: No itching, rash HEENT: No complaint RESPIRATORY: No cough, wheezing, SOB CARDIAC: No chest pain, palpitations, +lower extremity edema ith pain as per history of present illness GI: No abdominal pain, No N/V/D or constipation, No heartburn or reflux  GU: No dysuria, frequency or urgency, or incontinence  MUSCULOSKELETAL: No unrelieved bone/joint pain NEUROLOGIC: No headache, dizziness  PSYCHIATRIC: No overt anxiety or sadness  Vitals:   11/26/16 1534  BP: 106/66  Pulse: 66  Temp: (!) 97.5 F (36.4 C)  Body mass index is 28.12 kg/m. Physical Exam  GENERAL APPEARANCE: Alert, conversant, No acute distress  SKIN: No diaphoresis rash HEENT: Unremarkable except for deep scar to left cheek  RESPIRATORY: Breathing is even, unlabored. Lung sounds are clear   CARDIOVASCULAR: Heart RRR no murmurs, rubs or gallops. 2+ peripheral edema ith no redness or heat, not tender to past patient posteriorly and calf or thigh GASTROINTESTINAL: Abdomen is soft, non-tender, not distended w/ normal bowel sounds.  GENITOURINARY: Bladder non tender, not distended  MUSCULOSKELETAL: No abnormal joints or musculature NEUROLOGIC: Cranial  nerves 2-12 grossly intact. Moves all extremities PSYCHIATRIC: Mood and affect appropriate to situation, no behavioral issues  Patient Active Problem List   Diagnosis Date Noted  . Anxiety 11/03/2016  . Dysphagia 07/13/2016  . Anasarca 05/21/2016  . Obesity (BMI 30.0-34.9) 05/21/2016  . Closed TBI (traumatic brain injury) ([facility]) 04/21/2016  . Status post insertion of percutaneous endoscopic gastrostomy (PEG) tube ([facility]) 04/21/2016  . Facial laceration 04/21/2016  . Cheek wound, right, subsequent encounter 04/21/2016  . Urinary tract infection due to extended-spectrum beta lactamase (ESBL) producing Escherichia coli 04/21/2016  . Hypotension 04/21/2016  . Gout   . Hypertension     CMP     Component Value Date/Time   NA 143 10/21/2016   K 3.5 10/21/2016   BUN 13 10/21/2016   CREATININE 0.5 10/21/2016   AST 16 08/05/2013   ALT 9 08/05/2013    Recent Labs  04/12/16 05/27/16 10/21/16  NA 139 145 143  K 4.0 3.9 3.5  BUN 20 18 13   CREATININE 0.5 0.5 0.5   No results for input(s): AST, ALT, ALKPHOS, BILITOT, PROT, ALBUMIN in the last 8760 hours.  Recent Labs  03/31/16 03/31/16 1838 04/10/16 04/12/16  WBC 15.0 15.0 14.0 12.5  HGB 13.2 13.2 11.4* 10.1*  HCT 39 39 35*  --   PLT 185 185 210 156   No results for input(s): CHOL, LDLCALC, TRIG in the last 8760 hours.  Invalid input(s): HCL No results found for: MICROALBUR Lab Results  Component Value Date   TSH 1.68 08/06/2013   No results found for: HGBA1C No results found for: CHOL, HDL, LDLCALC, LDLDIRECT, TRIG, CHOLHDL  Significant Diagnostic Results in last 30 days:  No results found.  Assessment and Plan  BLE SWELLINGAND PAIN-patient is at risk being relatively sedentary; have ordered bilateral lower extremity ultrasound secondary to pain with swelling; if negative we will place TED hose and increase daily Lasix    [name], MD

## 2016-12-15 ENCOUNTER — Encounter: Payer: Self-pay | Admitting: Internal Medicine

## 2016-12-25 ENCOUNTER — Inpatient Hospital Stay (HOSPITAL_COMMUNITY)
Admission: EM | Admit: 2016-12-25 | Discharge: 2016-12-29 | DRG: 481 | Disposition: A | Discharge status: Skilled Nursing Facility | Payer: Medicare Other | Attending: Internal Medicine | Admitting: Internal Medicine

## 2016-12-25 ENCOUNTER — Non-Acute Institutional Stay (SKILLED_NURSING_FACILITY): Payer: Medicare Other | Admitting: Internal Medicine

## 2016-12-25 ENCOUNTER — Emergency Department (HOSPITAL_COMMUNITY): Payer: Medicare Other

## 2016-12-25 ENCOUNTER — Other Ambulatory Visit: Payer: Self-pay

## 2016-12-25 ENCOUNTER — Encounter: Payer: Self-pay | Admitting: Internal Medicine

## 2016-12-25 DIAGNOSIS — Z87820 Personal history of traumatic brain injury: Secondary | ICD-10-CM | POA: Diagnosis not present

## 2016-12-25 DIAGNOSIS — M542 Cervicalgia: Secondary | ICD-10-CM | POA: Diagnosis not present

## 2016-12-25 DIAGNOSIS — G9340 Encephalopathy, unspecified: Secondary | ICD-10-CM | POA: Diagnosis present

## 2016-12-25 DIAGNOSIS — F419 Anxiety disorder, unspecified: Secondary | ICD-10-CM | POA: Diagnosis present

## 2016-12-25 DIAGNOSIS — R9431 Abnormal electrocardiogram [ECG] [EKG]: Secondary | ICD-10-CM | POA: Diagnosis not present

## 2016-12-25 DIAGNOSIS — E669 Obesity, unspecified: Secondary | ICD-10-CM | POA: Diagnosis not present

## 2016-12-25 DIAGNOSIS — S72101D Unspecified trochanteric fracture of right femur, subsequent encounter for closed fracture with routine healing: Secondary | ICD-10-CM | POA: Diagnosis not present

## 2016-12-25 DIAGNOSIS — R601 Generalized edema: Secondary | ICD-10-CM | POA: Diagnosis not present

## 2016-12-25 DIAGNOSIS — S43004A Unspecified dislocation of right shoulder joint, initial encounter: Secondary | ICD-10-CM

## 2016-12-25 DIAGNOSIS — I5030 Unspecified diastolic (congestive) heart failure: Secondary | ICD-10-CM | POA: Diagnosis present

## 2016-12-25 DIAGNOSIS — I509 Heart failure, unspecified: Secondary | ICD-10-CM | POA: Diagnosis not present

## 2016-12-25 DIAGNOSIS — Z66 Do not resuscitate: Secondary | ICD-10-CM | POA: Diagnosis present

## 2016-12-25 DIAGNOSIS — N390 Urinary tract infection, site not specified: Secondary | ICD-10-CM | POA: Diagnosis present

## 2016-12-25 DIAGNOSIS — M810 Age-related osteoporosis without current pathological fracture: Secondary | ICD-10-CM | POA: Diagnosis present

## 2016-12-25 DIAGNOSIS — Z931 Gastrostomy status: Secondary | ICD-10-CM | POA: Diagnosis not present

## 2016-12-25 DIAGNOSIS — Z9181 History of falling: Secondary | ICD-10-CM | POA: Diagnosis not present

## 2016-12-25 DIAGNOSIS — S199XXA Unspecified injury of neck, initial encounter: Secondary | ICD-10-CM | POA: Diagnosis not present

## 2016-12-25 DIAGNOSIS — Z6829 Body mass index (BMI) 29.0-29.9, adult: Secondary | ICD-10-CM

## 2016-12-25 DIAGNOSIS — S069X6D Unspecified intracranial injury with loss of consciousness greater than 24 hours without return to pre-existing conscious level with patient surviving, subsequent encounter: Secondary | ICD-10-CM | POA: Diagnosis not present

## 2016-12-25 DIAGNOSIS — S069X9A Unspecified intracranial injury with loss of consciousness of unspecified duration, initial encounter: Secondary | ICD-10-CM | POA: Diagnosis present

## 2016-12-25 DIAGNOSIS — R51 Headache: Secondary | ICD-10-CM | POA: Diagnosis not present

## 2016-12-25 DIAGNOSIS — R1310 Dysphagia, unspecified: Secondary | ICD-10-CM | POA: Diagnosis present

## 2016-12-25 DIAGNOSIS — E66811 Obesity, class 1: Secondary | ICD-10-CM | POA: Diagnosis present

## 2016-12-25 DIAGNOSIS — S72009A Fracture of unspecified part of neck of unspecified femur, initial encounter for closed fracture: Secondary | ICD-10-CM | POA: Diagnosis not present

## 2016-12-25 DIAGNOSIS — R488 Other symbolic dysfunctions: Secondary | ICD-10-CM | POA: Diagnosis not present

## 2016-12-25 DIAGNOSIS — S72001A Fracture of unspecified part of neck of right femur, initial encounter for closed fracture: Secondary | ICD-10-CM | POA: Diagnosis present

## 2016-12-25 DIAGNOSIS — W050XXA Fall from non-moving wheelchair, initial encounter: Secondary | ICD-10-CM | POA: Diagnosis present

## 2016-12-25 DIAGNOSIS — D62 Acute posthemorrhagic anemia: Secondary | ICD-10-CM | POA: Diagnosis not present

## 2016-12-25 DIAGNOSIS — Z8679 Personal history of other diseases of the circulatory system: Secondary | ICD-10-CM

## 2016-12-25 DIAGNOSIS — R296 Repeated falls: Secondary | ICD-10-CM | POA: Diagnosis present

## 2016-12-25 DIAGNOSIS — Y92122 Bedroom in nursing home as the place of occurrence of the external cause: Secondary | ICD-10-CM | POA: Diagnosis not present

## 2016-12-25 DIAGNOSIS — B9620 Unspecified Escherichia coli [E. coli] as the cause of diseases classified elsewhere: Secondary | ICD-10-CM | POA: Diagnosis present

## 2016-12-25 DIAGNOSIS — Z885 Allergy status to narcotic agent status: Secondary | ICD-10-CM | POA: Diagnosis not present

## 2016-12-25 DIAGNOSIS — R69 Illness, unspecified: Secondary | ICD-10-CM | POA: Diagnosis not present

## 2016-12-25 DIAGNOSIS — S0990XA Unspecified injury of head, initial encounter: Secondary | ICD-10-CM | POA: Diagnosis not present

## 2016-12-25 DIAGNOSIS — M25551 Pain in right hip: Secondary | ICD-10-CM | POA: Diagnosis not present

## 2016-12-25 DIAGNOSIS — I10 Essential (primary) hypertension: Secondary | ICD-10-CM

## 2016-12-25 DIAGNOSIS — W19XXXA Unspecified fall, initial encounter: Secondary | ICD-10-CM | POA: Diagnosis not present

## 2016-12-25 DIAGNOSIS — M1710 Unilateral primary osteoarthritis, unspecified knee: Secondary | ICD-10-CM | POA: Diagnosis present

## 2016-12-25 DIAGNOSIS — I361 Nonrheumatic tricuspid (valve) insufficiency: Secondary | ICD-10-CM | POA: Diagnosis not present

## 2016-12-25 DIAGNOSIS — M6281 Muscle weakness (generalized): Secondary | ICD-10-CM | POA: Diagnosis not present

## 2016-12-25 DIAGNOSIS — S72141A Displaced intertrochanteric fracture of right femur, initial encounter for closed fracture: Principal | ICD-10-CM | POA: Diagnosis present

## 2016-12-25 DIAGNOSIS — M25511 Pain in right shoulder: Secondary | ICD-10-CM | POA: Diagnosis not present

## 2016-12-25 DIAGNOSIS — M109 Gout, unspecified: Secondary | ICD-10-CM | POA: Diagnosis not present

## 2016-12-25 DIAGNOSIS — Z79899 Other long term (current) drug therapy: Secondary | ICD-10-CM

## 2016-12-25 DIAGNOSIS — I110 Hypertensive heart disease with heart failure: Secondary | ICD-10-CM | POA: Diagnosis present

## 2016-12-25 DIAGNOSIS — R4182 Altered mental status, unspecified: Secondary | ICD-10-CM | POA: Diagnosis not present

## 2016-12-25 DIAGNOSIS — M8588 Other specified disorders of bone density and structure, other site: Secondary | ICD-10-CM | POA: Diagnosis not present

## 2016-12-25 DIAGNOSIS — S069XAA Unspecified intracranial injury with loss of consciousness status unknown, initial encounter: Secondary | ICD-10-CM | POA: Diagnosis present

## 2016-12-25 LAB — URINALYSIS, ROUTINE W REFLEX MICROSCOPIC
BILIRUBIN URINE: NEGATIVE
Glucose, UA: NEGATIVE mg/dL
HGB URINE DIPSTICK: NEGATIVE
KETONES UR: NEGATIVE mg/dL
NITRITE: POSITIVE — AB
Protein, ur: NEGATIVE mg/dL
SPECIFIC GRAVITY, URINE: 1.025 (ref 1.005–1.030)
SQUAMOUS EPITHELIAL / LPF: NONE SEEN
pH: 5 (ref 5.0–8.0)

## 2016-12-25 LAB — COMPREHENSIVE METABOLIC PANEL
ALBUMIN: 3.2 g/dL — AB (ref 3.5–5.0)
ALT: 15 U/L (ref 14–54)
ANION GAP: 5 (ref 5–15)
AST: 21 U/L (ref 15–41)
Alkaline Phosphatase: 69 U/L (ref 38–126)
BUN: 14 mg/dL (ref 6–20)
CALCIUM: 8.5 mg/dL — AB (ref 8.9–10.3)
CHLORIDE: 103 mmol/L (ref 101–111)
CO2: 29 mmol/L (ref 22–32)
Creatinine, Ser: 0.52 mg/dL (ref 0.44–1.00)
GFR calc non Af Amer: >60 mL/min (ref >60)
GLUCOSE: 129 mg/dL — AB (ref 65–99)
POTASSIUM: 3.6 mmol/L (ref 3.5–5.1)
Sodium: 137 mmol/L (ref 135–145)
Total Bilirubin: 0.7 mg/dL (ref 0.3–1.2)
Total Protein: 6 g/dL — ABNORMAL LOW (ref 6.5–8.1)

## 2016-12-25 LAB — CBC WITH DIFFERENTIAL/PLATELET
BASOS PCT: 0 %
Basophils Absolute: 0 10*3/uL (ref 0.0–0.1)
EOS ABS: 0 10*3/uL (ref 0.0–0.7)
EOS PCT: 0 %
HCT: 35.4 % — ABNORMAL LOW (ref 36.0–46.0)
HEMOGLOBIN: 11.5 g/dL — AB (ref 12.0–15.0)
LYMPHS ABS: 1.3 10*3/uL (ref 0.7–4.0)
Lymphocytes Relative: 13 %
MCH: 29.1 pg (ref 26.0–34.0)
MCHC: 32.5 g/dL (ref 30.0–36.0)
MCV: 89.6 fL (ref 78.0–100.0)
MONO ABS: 0.6 10*3/uL (ref 0.1–1.0)
MONOS PCT: 6 %
NEUTROS PCT: 81 %
Neutro Abs: 8.7 10*3/uL — ABNORMAL HIGH (ref 1.7–7.7)
PLATELETS: 172 10*3/uL (ref 150–400)
RBC: 3.95 MIL/uL (ref 3.87–5.11)
RDW: 14.8 % (ref 11.5–15.5)
WBC: 10.6 10*3/uL — ABNORMAL HIGH (ref 4.0–10.5)

## 2016-12-25 MED ORDER — DEXTROSE 5 % IV SOLN
2.0000 g | Freq: Once | INTRAVENOUS | Status: AC
  Administered 2016-12-25: 2 g via INTRAVENOUS
  Filled 2016-12-25: qty 2

## 2016-12-25 MED ORDER — FENTANYL CITRATE (PF) 100 MCG/2ML IJ SOLN
50.0000 ug | Freq: Once | INTRAMUSCULAR | Status: AC
  Administered 2016-12-25: 50 ug via INTRAVENOUS
  Filled 2016-12-25: qty 2

## 2016-12-25 MED ORDER — ESCITALOPRAM OXALATE 10 MG PO TABS
5.0000 mg | ORAL_TABLET | Freq: Every day | ORAL | Status: DC
  Administered 2016-12-26 – 2016-12-29 (×3): 5 mg via ORAL
  Filled 2016-12-25 (×3): qty 1

## 2016-12-25 MED ORDER — ALPRAZOLAM 0.25 MG PO TABS
0.2500 mg | ORAL_TABLET | Freq: Every evening | ORAL | Status: DC | PRN
  Administered 2016-12-26: 0.25 mg via ORAL
  Filled 2016-12-25: qty 1

## 2016-12-25 MED ORDER — DEXTROSE 5 % IV SOLN
1.0000 g | INTRAVENOUS | Status: DC
  Administered 2016-12-26 – 2016-12-27 (×2): 1 g via INTRAVENOUS
  Filled 2016-12-25 (×2): qty 10

## 2016-12-25 NOTE — ED Notes (Addendum)
Autumn Bryant [phone] (son) wants dr to call him.

## 2016-12-25 NOTE — Progress Notes (Signed)
Location:  [facility] Room Number: [facility]:  SNF [phone])  [name], MD  [name], [name]  Extended Emergency Contact Information Primary Emergency Contact: [name] Address: [address]          [place], [place] [address] [name] of [place] Phone: [phone] Relation: Son Secondary Emergency Contact: [name],[name]          [place], [place] Relation: Sister    Allergies: Demerol [meperidine]  Chief Complaint  Patient presents with  . Acute Visit    fall    HPI: Patient is 81 y.o. female who nursing asked me to see for a fall she had yesterday. Patient did not hit her head no loss of consciousness but she has been in the bed all day complaint of pain all over. Patient really cannot say what makes it better or worse.  Past Medical History:  Diagnosis Date  . Anasarca 05/21/2016  . Anxiety 11/03/2016  . Closed TBI (traumatic brain injury) ([facility]) 04/21/2016  . Dysphagia 07/13/2016  . Gout   . Hypertension   . Obesity (BMI 30.0-34.9) 05/21/2016  . Status post insertion of percutaneous endoscopic gastrostomy (PEG) tube ([facility]) 04/21/2016    Past Surgical History:  Procedure Laterality Date  . MASTECTOMY      Allergies as of 12/25/2016      Reactions   Demerol [meperidine] Swelling      Medication List        Accurate as of 12/25/16  4:05 PM. Always use your most recent med list.          acetaminophen 500 MG tablet Commonly known as:  TYLENOL Take 1,000 mg by mouth 3 (three) times daily.   ALPRAZolam 0.25 MG tablet Commonly known as:  XANAX Take 0.25 mg by mouth. Take one tablet at bedtime for anxiety/agitation   escitalopram 5 MG tablet Commonly known as:  LEXAPRO Take 5 mg by mouth. Take one tablet daily for depression   furosemide 20 MG tablet Commonly known as:  LASIX Take 20 mg by mouth daily.     OSMOLITE 1.2 CAL PO Initiate Osmolite 1.2 by tube  daily due to poor intake.   sterile water for irrigation Irrigate with 60 mLs as directed 3 (three) times daily.       No orders of the defined types were placed in this encounter.   Immunization History  Administered Date(s) Administered  . Influenza-Unspecified 12/19/2016  . PPD Test 04/29/2016  . Pneumococcal Polysaccharide-23 04/15/2016  . Tdap 03/31/2016    Social History   Tobacco Use  . Smoking status: Never Smoker  . Smokeless tobacco: Never Used  Substance Use Topics  . Alcohol use: No    Review of Systems  unable to obtain a meeting and then patient hurts all over from patient    Vitals:   12/25/16 1600  BP: 106/63  Pulse: 66  Resp: 18  Temp: (!) 96.7 F (35.9 C)   Body mass index is 29.59 kg/m. Physical Exam  GENERAL APPEARANCE: Alert, conversant, appears in mild distress SKIN: No diaphoresis rash HEENT: Unremarkable; no bruising or tenderness to palpation RESPIRATORY: Breathing is even, unlabored. Lung sounds are clear   CARDIOVASCULAR: Heart RRR no murmurs, rubs or gallops. No peripheral edema  GASTROINTESTINAL: Abdomen is soft, non-tender, not distended w/ normal bowel sounds.  GENITOURINARY: Bladder non tender, not distended  MUSCULOSKELETAL:  Left shoulder elbow arm hip knee and ankle do not appear tender to palpation and have no bruising; right shoulder would drop off consistent with dislocated shoulder, right hip pain to palpation NEUROLOGIC: Cranial nerves 2-12 grossly intact. Moves all extremities PSYCHIATRIC: Mood and affect appropriate to situation with some mental delay, no behavioral issues  Patient Active Problem List   Diagnosis Date Noted  . Anxiety 11/03/2016  . Dysphagia 07/13/2016  . Anasarca 05/21/2016  . Obesity (BMI 30.0-34.9) 05/21/2016  . Closed TBI (traumatic brain injury) ([facility]) 04/21/2016  . Status post insertion of percutaneous endoscopic gastrostomy (PEG) tube  ([facility]) 04/21/2016  . Facial laceration 04/21/2016  . Cheek wound, right, subsequent encounter 04/21/2016  . Urinary tract infection due to extended-spectrum beta lactamase (ESBL) producing Escherichia coli 04/21/2016  . Hypotension 04/21/2016  . Gout   . Hypertension     CMP     Component Value Date/Time   NA 143 10/21/2016   K 3.5 10/21/2016   BUN 13 10/21/2016   CREATININE 0.5 10/21/2016   AST 16 08/05/2013   ALT 9 08/05/2013   Recent Labs    04/12/16 05/27/16 10/21/16  NA 139 145 143  K 4.0 3.9 3.5  BUN 20 18 13   CREATININE 0.5 0.5 0.5   No results for input(s): AST, ALT, ALKPHOS, BILITOT, PROT, ALBUMIN in the last 8760 hours. Recent Labs    03/31/16 03/31/16 1838 04/10/16 04/12/16  WBC 15.0 15.0 14.0 12.5  HGB 13.2 13.2 11.4* 10.1*  HCT 39 39 35*  --   PLT 185 185 210 156   No results for input(s): CHOL, LDLCALC, TRIG in the last 8760 hours.  Invalid input(s): HCL No results found for: MICROALBUR Lab Results  Component Value Date   TSH 1.68 08/06/2013   No results found for: HGBA1C No results found for: CHOL, HDL, LDLCALC, LDLDIRECT, TRIG, CHOLHDL  Significant Diagnostic Results in last 30 days:  No results found.  Assessment and Plan  FALL/RIGHT INTERTROCHANTERIC FRACTURE/RIGHT SHOULDER DISLOCATION-I reviewed the films as well as the radiology tech was taking them; patient is being sent to [facility] D. [name], MD

## 2016-12-25 NOTE — ED Provider Notes (Signed)
[facility] Provider Note   CSN: [id number] Arrival date & time: 12/25/16  1710     History   Chief Complaint Chief Complaint  Patient presents with  . Fall    HPI Autumn Bryant is a 81 y.o. female.  HPI   81yo female with history of TBI, htn, presents with concern for fall while trying to transfer from wheelchair to bed.  Fall occurred last night at [place], but did not complain of any pain.  No LOC. Today reported some pain in her right hip. Getting tramadol which helped pain at facility but Dr. Rosanna Randy to get XR today just in case, and XR was concerning for right hip fracture.   History of non-healing right humerus fracture since March, not improving per facility, she does not have use of her right arm, long time skilled nursing.  Continuous tube feeding, now weaned off, bolus tube feeds. Needs assistance with transfers.   Patient reports only hip pain, worse with movement. Denies headache. Does report neck pain on ROS. She does not remember details of fall.  Facility reports she is a poor historian.  History limited by patient TBI hx. Level V Caveat     Past Medical History:  Diagnosis Date  . Anasarca 05/21/2016  . Anxiety 11/03/2016  . Closed TBI (traumatic brain injury) ([place]) 04/21/2016  . Dysphagia 07/13/2016  . Gout   . Hypertension   . Obesity (BMI 30.0-34.9) 05/21/2016  . Status post insertion of percutaneous endoscopic gastrostomy (PEG) tube ([place]) 04/21/2016    Patient Active Problem List   Diagnosis Date Noted  . Hip fracture ([place]) 12/25/2016  . Anxiety 11/03/2016  . Dysphagia 07/13/2016  . Anasarca 05/21/2016  . Obesity (BMI 30.0-34.9) 05/21/2016  . Closed TBI (traumatic brain injury) ([place]) 04/21/2016  . Status post insertion of percutaneous endoscopic gastrostomy (PEG) tube ([place]) 04/21/2016  . Facial laceration 04/21/2016  . Cheek wound, right, subsequent encounter 04/21/2016  . Urinary tract infection due to  extended-spectrum beta lactamase (ESBL) producing Escherichia coli 04/21/2016  . Hypotension 04/21/2016  . Gout   . Hypertension     Past Surgical History:  Procedure Laterality Date  . MASTECTOMY      OB History    No data available       Home Medications    Prior to Admission medications   Medication Sig Start Date End Date Taking? Authorizing Provider  acetaminophen (TYLENOL) 500 MG tablet Take 1,000 mg by mouth 3 (three) times daily.    Yes [provider]  ALPRAZolam (XANAX) 0.25 MG tablet Take 0.25 mg by mouth. Take one tablet at bedtime for anxiety/agitation   Yes [provider]  escitalopram (LEXAPRO) 5 MG tablet Take 5 mg by mouth. Take one tablet daily for depression   Yes [provider]  furosemide (LASIX) 20 MG tablet Take 20 mg by mouth daily.   Yes [provider]  gabapentin (NEURONTIN) 100 MG capsule Take 200 mg by mouth 3 (three) times daily.   Yes [provider]  Nutritional Supplements (OSMOLITE 1.2 CAL PO) Initiate Osmolite 1.2 by tube  daily due to poor intake.   Yes [provider]  Water For Irrigation, Sterile (STERILE WATER FOR IRRIGATION) Irrigate with 60 mLs as directed 3 (three) times daily.    [provider]    Family History Family History  Problem Relation Age of Onset  . Asthma Mother   . Anesthesia problems Neg Hx   .  Arthritis Neg Hx   . Cancer Neg Hx   . Cerebral palsy Neg Hx   . Clotting disorder Neg Hx   . Club foot Neg Hx   . Collagen disease Neg Hx   . Deep vein thrombosis Neg Hx   . Gait disorder Neg Hx   . Heart disease Neg Hx   . Gout Neg Hx   . Diabetes Neg Hx   . Hip dysplasia Neg Hx   . Hip fracture Neg Hx   . Hypermobility Neg Hx   . Hypertension Neg Hx   . Osteoporosis Neg Hx   . Other Neg Hx   . Stroke Neg Hx   . Thyroid disease Neg Hx   . Vasculitis Neg Hx   . Spina bifida Neg Hx   . Scoliosis Neg Hx   . Rheumatologic disease Neg Hx   .  Pulmonary embolism Neg Hx     Social History Social History   Tobacco Use  . Smoking status: Never Smoker  . Smokeless tobacco: Never Used  Substance Use Topics  . Alcohol use: No  . Drug use: No     Allergies   Demerol [meperidine]   Review of Systems Review of Systems  Unable to perform ROS: Dementia  Constitutional: Negative for fever.  HENT: Negative for sore throat.   Eyes: Negative for visual disturbance.  Respiratory: Negative for cough and shortness of breath.   Cardiovascular: Negative for chest pain.  Gastrointestinal: Negative for abdominal pain.  Genitourinary: Negative for difficulty urinating.  Musculoskeletal: Positive for arthralgias and neck pain. Negative for back pain.  Skin: Negative for rash.  Neurological: Negative for syncope and headaches.     Physical Exam Updated Vital Signs BP 137/76 (BP Location: Right Wrist)   Pulse 94   Temp 98.7 F (37.1 C) (Oral)   Resp 14   Ht 5\' 5"  (1.651 m)   Wt 80.3 kg (177 lb)   LMP  (LMP Unknown)   SpO2 100%   BMI 29.45 kg/m   Physical Exam  Constitutional: She is oriented to person, place, and time. She appears well-developed and well-nourished. No distress.  HENT:  Head: Normocephalic and atraumatic.  Eyes: Conjunctivae and EOM are normal.  Neck: Normal range of motion.  Cardiovascular: Normal rate, regular rhythm, normal heart sounds and intact distal pulses. Exam reveals no gallop and no friction rub.  No murmur heard. Pulmonary/Chest: Effort normal and breath sounds normal. No respiratory distress. She has no wheezes. She has no rales.  Abdominal: Soft. She exhibits no distension. There is no tenderness. There is no guarding.  Musculoskeletal: She exhibits no edema.       Right shoulder: She exhibits decreased range of motion, tenderness, deformity and pain. She exhibits normal pulse and normal strength.       Right hip: She exhibits decreased range of motion and tenderness. She exhibits no  laceration.       Cervical back: She exhibits bony tenderness.  Right lower extremity shortened and externally rotated  Right upper arm with tenderness, difficulty with ROM  Normal sensation, normal pulses  Neurological: She is alert and oriented to person, place, and time.  Skin: Skin is warm and dry. No rash noted. She is not diaphoretic. No erythema.  Nursing note and vitals reviewed.    ED Treatments / Results  Labs (all labs ordered are listed, but only abnormal results are displayed) Labs Reviewed  CBC WITH DIFFERENTIAL/PLATELET - Abnormal; Notable for the following components:  Result Value   WBC 10.6 (*)    Hemoglobin 11.5 (*)    HCT 35.4 (*)    Neutro Abs 8.7 (*)    All other components within normal limits  COMPREHENSIVE METABOLIC PANEL - Abnormal; Notable for the following components:   Glucose, Bld 129 (*)    Calcium 8.5 (*)    Total Protein 6.0 (*)    Albumin 3.2 (*)    All other components within normal limits  URINALYSIS, ROUTINE W REFLEX MICROSCOPIC - Abnormal; Notable for the following components:   Color, Urine AMBER (*)    APPearance HAZY (*)    Nitrite POSITIVE (*)    Leukocytes, UA SMALL (*)    Bacteria, UA MANY (*)    All other components within normal limits  URINE CULTURE  CULTURE, BLOOD (ROUTINE X 2)  CULTURE, BLOOD (ROUTINE X 2)  MRSA PCR SCREENING  COMPREHENSIVE METABOLIC PANEL  CBC    EKG  EKG Interpretation  Date/Time:  Wednesday December 25 2016 19:17:13 EST Ventricular Rate:  88 PR Interval:    QRS Duration: 102 QT Interval:  396 QTC Calculation: 480 R Axis:   -25 Text Interpretation:  Normal sinus rhythm with sinus arrhythmia Inferior infarct, old Lateral leads are also involved Baseline wander in lead(s) I II aVR V2 No previous ECGs available Confirmed by Gareth Morgan [phone]) on 12/25/2016 8:30:57 PM       Radiology Dg Shoulder Right  Result Date: 12/25/2016 CLINICAL DATA:  [place] 3 days ago.  Persistent shoulder  pain. EXAM: RIGHT SHOULDER - 2+ VIEW COMPARISON:  None. FINDINGS: Anterior dislocation of the humeral head. Severe remote trauma involving the humeral head, neck and proximal shaft with healed fractures. The right lung is grossly clear. Surgical changes involving the right breast are noted. IMPRESSION: Anterior subcoracoid dislocation of the humeral head. Severe remote trauma. Electronically Signed   By: Marijo Sanes M.D.   On: 12/25/2016 19:16   Ct Head Wo Contrast  Result Date: 12/25/2016 CLINICAL DATA:  Pain after fall. EXAM: CT HEAD WITHOUT CONTRAST CT CERVICAL SPINE WITHOUT CONTRAST TECHNIQUE: Multidetector CT imaging of the head and cervical spine was performed following the standard protocol without intravenous contrast. Multiplanar CT image reconstructions of the cervical spine were also generated. COMPARISON:  None. FINDINGS: CT HEAD FINDINGS Brain: Right anterior parietal lobe encephalomalacia with associated ex vacuo dilatation of the anterior horn of the right lateral ventricle. Superficial and central atrophy is noted. No acute intracranial hemorrhage, midline shift or edema. No large vascular territory infarct. No effacement of the basal cisterns or fourth ventricle. Vascular: Atherosclerotic calcifications of both vertebral arteries and carotid siphons. No hyperdense vessels. Skull: Right anterior parietal burr hole. No skull fracture or suspicious osseous lesions. Sinuses/Orbits: Right ethmoid sinus mucosal thickening noted posteriorly. No acute sinusitis. Clear mastoids. Other: Non CT CERVICAL SPINE FINDINGS Alignment: Normal cervical lordosis. Intact atlantodental interval with osteoarthritic joint space narrowing. Intact craniocervical relationship. Skull base and vertebrae: No acute fracture. No primary bone lesion or focal pathologic process. Soft tissues and spinal canal: No prevertebral fluid or swelling. No visible canal hematoma. Disc levels: No focal disc herniation or central canal  stenosis. No jumped or perched facets. No significant neural foraminal encroachment. Mild multilevel degenerative joint space narrowing and spurring about the facets. Upper chest: Negative. Other: None IMPRESSION: 1. Right anterior parietal encephalomalacia with ex vacuo dilatation of the anterior horn of the right lateral ventricle. This is likely a result of the reported remote traumatic brain injury  the patient's history. There is atrophy with chronic small vessel ischemia. No acute intracranial abnormality. 2. No acute cervical spine fracture or posttraumatic subluxation. Mild multilevel facet arthropathy. Electronically Signed   By: Ashley Royalty M.D.   On: 12/25/2016 21:10   Ct Cervical Spine Wo Contrast  Result Date: 12/25/2016 CLINICAL DATA:  Pain after fall. EXAM: CT HEAD WITHOUT CONTRAST CT CERVICAL SPINE WITHOUT CONTRAST TECHNIQUE: Multidetector CT imaging of the head and cervical spine was performed following the standard protocol without intravenous contrast. Multiplanar CT image reconstructions of the cervical spine were also generated. COMPARISON:  None. FINDINGS: CT HEAD FINDINGS Brain: Right anterior parietal lobe encephalomalacia with associated ex vacuo dilatation of the anterior horn of the right lateral ventricle. Superficial and central atrophy is noted. No acute intracranial hemorrhage, midline shift or edema. No large vascular territory infarct. No effacement of the basal cisterns or fourth ventricle. Vascular: Atherosclerotic calcifications of both vertebral arteries and carotid siphons. No hyperdense vessels. Skull: Right anterior parietal burr hole. No skull fracture or suspicious osseous lesions. Sinuses/Orbits: Right ethmoid sinus mucosal thickening noted posteriorly. No acute sinusitis. Clear mastoids. Other: Non CT CERVICAL SPINE FINDINGS Alignment: Normal cervical lordosis. Intact atlantodental interval with osteoarthritic joint space narrowing. Intact craniocervical relationship.  Skull base and vertebrae: No acute fracture. No primary bone lesion or focal pathologic process. Soft tissues and spinal canal: No prevertebral fluid or swelling. No visible canal hematoma. Disc levels: No focal disc herniation or central canal stenosis. No jumped or perched facets. No significant neural foraminal encroachment. Mild multilevel degenerative joint space narrowing and spurring about the facets. Upper chest: Negative. Other: None IMPRESSION: 1. Right anterior parietal encephalomalacia with ex vacuo dilatation of the anterior horn of the right lateral ventricle. This is likely a result of the reported remote traumatic brain injury the patient's history. There is atrophy with chronic small vessel ischemia. No acute intracranial abnormality. 2. No acute cervical spine fracture or posttraumatic subluxation. Mild multilevel facet arthropathy. Electronically Signed   By: Ashley Royalty M.D.   On: 12/25/2016 21:10   Dg Hip Unilat W Or Wo Pelvis 2-3 Views Right  Result Date: 12/25/2016 CLINICAL DATA:  Fall 3 days ago. Right hip pain. Initial encounter. EXAM: DG HIP (WITH OR WITHOUT PELVIS) 2-3V RIGHT COMPARISON:  None. FINDINGS: Acute intertrochanteric right femur fracture with varus angulation. Both hips are located. Prominent osteopenia. Moderate stool distending the rectum. IMPRESSION: 1. Intertrochanteric right femur fracture with varus angulation. 2. Marked osteopenia. 3. Prominent rectal stool volume. Electronically Signed   By: Monte Fantasia M.D.   On: 12/25/2016 19:16    Procedures Procedures (including critical care time)  Medications Ordered in ED Medications  ALPRAZolam (XANAX) tablet 0.25 mg (not administered)  escitalopram (LEXAPRO) tablet 5 mg (not administered)  cefTRIAXone (ROCEPHIN) 1 g in dextrose 5 % 50 mL IVPB (not administered)  morphine 4 MG/ML injection 2 mg (2 mg Intravenous Given 12/26/16 0127)  dextrose 5 %-0.45 % sodium chloride infusion ( Intravenous New Bag/Given  12/26/16 0127)  fentaNYL (SUBLIMAZE) injection 50 mcg (50 mcg Intravenous Given 12/25/16 1822)  cefTRIAXone (ROCEPHIN) 2 g in dextrose 5 % 50 mL IVPB (0 g Intravenous Stopped 12/25/16 2343)  fentaNYL (SUBLIMAZE) injection 50 mcg (50 mcg Intravenous Given 12/25/16 2226)     Initial Impression / Assessment and Plan / ED Course  I have reviewed the triage vital signs and the nursing notes.  Pertinent labs & imaging results that were available during my care of the patient were  reviewed by me and considered in my medical decision making (see chart for details).     81yo female with history of TBI, htn, presents with concern for fall while trying to transfer from wheelchair to bed yesterday.  CT head and CSpine without acute injuries. XR shows significant prior trauma of humeral head, dislocation. Unclear if chronic based on history provided from facility and patient as they report she came with abnormality of her right shoulder to facility and has been chronically unable to use it and are concerned regarding hip.  Of note, in 2015 she had XR showing dislocation and they had recommended CT for further evaluation, however she declined both imaging and possible future surgical repair at that time and they had discussed risks associated with chronic dislocation.  Hip XR shows intertrochanteric fx. Closed, NV intact.  Discussed with Dr. Maureen Ralphs, both hip and shoulder. Does not recommend shoulder intervention, and given history that this is chronic per facility do not feel reduction is indicated at this time.   Dr. Maureen Ralphs planning for evaluation tomorrow AM.  UA shows UTI and given rocephin. Admitted to hospitalist for further care.   Final Clinical Impressions(s) / ED Diagnoses   Final diagnoses:  Fall, initial encounter  Closed displaced intertrochanteric fracture of right femur, initial encounter [facility])    ED Discharge Orders    None         Gareth Morgan, MD 01/04/17 2440      Gareth Morgan, MD 01/09/17 1242

## 2016-12-25 NOTE — ED Notes (Signed)
Bed 1603 @2211 

## 2016-12-25 NOTE — ED Triage Notes (Signed)
Transported by [employer] from [facility]. Patient experienced a fall last night at 7pm, injuring right hip and right shoulder. Today's XR (performed by the facility) confirmed a right hip fracture. Right shoulder placed in sling upon arrival. +Shortening, pain and external rotation noted to affected lower extremity.

## 2016-12-25 NOTE — H&P (Addendum)
History and Physical    Autumn Bryant XLK:[id number] DOB: 1935/04/07 DOA: 12/25/2016  I have briefly reviewed the patient's prior medical records in [place]  PCP: [name], MD  Patient coming from: SNF  Chief Complaint: Fall resulting in hip fracture  HPI: Autumn Bryant is a 81 y.o. female with medical history significant of close TBI in the past, hypertension, obesity, anasarca, presents to the emergency room with chief complaint of right hip pain after having a fall yesterday.  She underwent an x-ray at the nursing home which revealed a hip fracture and she was brought to the emergency room.  Patient has underlying confusion on my evaluation, I am not clear about her baseline given TBI, she thinks is 75 and is not sure which hospital she is at.  Currently she has no complaints for me, denies any chest pain, denies any shortness of breath.  She has no abdominal pain, nausea or vomiting.  She denies any dysuria.  Of note, she had a recent hospitalization in February 2018 at [facility] following a fall and she was found to have intraparenchymal hematoma of the brain on the right status post bur hole evacuation.  Patient currently has no recollection of this hospitalization  ED Course: In the ED she is afebrile, blood pressure is stable and she is satting well on room air.  Blood work reveals white count of 10.6, and a UA which appears small leukocytes positive nitrates and many bacteria.Hip x-ray showed intertrochanteric right femur fracture with varus angulation.  Orthopedic surgery was consulted by EDP and we were asked to admit.  She was also given ceftriaxone for UTI  Review of Systems: Unable to obtain accurate review of systems due to underlying confusion  Past Medical History:  Diagnosis Date  . Anasarca 05/21/2016  . Anxiety 11/03/2016  . Closed TBI (traumatic brain injury) ([place]) 04/21/2016  . Dysphagia 07/13/2016  . Gout   . Hypertension   . Obesity  (BMI 30.0-34.9) 05/21/2016  . Status post insertion of percutaneous endoscopic gastrostomy (PEG) tube ([place]) 04/21/2016    Past Surgical History:  Procedure Laterality Date  . MASTECTOMY       reports that  has never smoked. she has never used smokeless tobacco. She reports that she does not drink alcohol or use drugs.  Allergies  Allergen Reactions  . Demerol [Meperidine] Swelling    Family History  Problem Relation Age of Onset  . Asthma Mother   . Anesthesia problems Neg Hx   . Arthritis Neg Hx   . Cancer Neg Hx   . Cerebral palsy Neg Hx   . Clotting disorder Neg Hx   . Club foot Neg Hx   . Collagen disease Neg Hx   . Deep vein thrombosis Neg Hx   . Gait disorder Neg Hx   . Heart disease Neg Hx   . Gout Neg Hx   . Diabetes Neg Hx   . Hip dysplasia Neg Hx   . Hip fracture Neg Hx   . Hypermobility Neg Hx   . Hypertension Neg Hx   . Osteoporosis Neg Hx   . Other Neg Hx   . Stroke Neg Hx   . Thyroid disease Neg Hx   . Vasculitis Neg Hx   . Spina bifida Neg Hx   . Scoliosis Neg Hx   . Rheumatologic disease Neg Hx   . Pulmonary embolism Neg Hx     Prior to Admission medications  Medication Sig Start Date End Date Taking? Authorizing Provider  acetaminophen (TYLENOL) 500 MG tablet Take 1,000 mg by mouth 3 (three) times daily.     [provider]  ALPRAZolam Duanne Moron) 0.25 MG tablet Take 0.25 mg by mouth. Take one tablet at bedtime for anxiety/agitation    [provider]  escitalopram (LEXAPRO) 5 MG tablet Take 5 mg by mouth. Take one tablet daily for depression    [provider]  furosemide (LASIX) 20 MG tablet Take 20 mg by mouth daily.    [provider]  Nutritional Supplements (OSMOLITE 1.2 CAL PO) Initiate Osmolite 1.2 by tube  daily due to poor intake.    [provider]  Water For Irrigation, Sterile (STERILE WATER FOR IRRIGATION) Irrigate with 60 mLs as directed 3 (three) times daily.    [provider]     Physical Exam: Vitals:   12/25/16 1729 12/25/16 1930 12/25/16 2000 12/25/16 2002  BP: (!) 151/78 123/75 129/84   Pulse: 86 85 100   Resp: 16 (!) 25 20   Temp: (!) 97.3 F (36.3 C)     TempSrc: Oral     SpO2: 96% 96% 100%   Weight:    80.3 kg (177 lb)  Height:    5\' 5"  (1.651 m)    Constitutional: NAD Eyes: lids and conjunctivae normal ENMT: Mucous membranes are moist Neck: normal, supple, no masses, no thyromegaly Respiratory: clear to auscultation bilaterally, no wheezing, no crackles. Normal respiratory effort. No accessory muscle use.  Cardiovascular: Regular rate and rhythm, no murmurs / rubs / gallops.  Trace lower extremity edema. 2+ pedal pulses.  Abdomen: no tenderness, no masses palpated. Bowel sounds positive.  Musculoskeletal: no clubbing / cyanosis. Normal muscle tone.  Skin: no rashes, lesions, ulcers. No induration Neurologic: Nonfocal Psychiatric: Normal judgment and insight. Alert and oriented x to person only. Normal mood.   Labs on Admission: I have personally reviewed following labs and imaging studies  CBC: Recent Labs  Lab 12/25/16 1800  WBC 10.6*  NEUTROABS 8.7*  HGB 11.5*  HCT 35.4*  MCV 89.6  PLT 403   Basic Metabolic Panel: Recent Labs  Lab 12/25/16 1800  NA 137  K 3.6  CL 103  CO2 29  GLUCOSE 129*  BUN 14  CREATININE 0.52  CALCIUM 8.5*   GFR: Estimated Creatinine Clearance: 59.7 mL/min (by C-G formula based on SCr of 0.52 mg/dL). Liver Function Tests: Recent Labs  Lab 12/25/16 1800  AST 21  ALT 15  ALKPHOS 69  BILITOT 0.7  PROT 6.0*  ALBUMIN 3.2*   No results for input(s): LIPASE, AMYLASE in the last 168 hours. No results for input(s): AMMONIA in the last 168 hours. Coagulation Profile: No results for input(s): INR, PROTIME in the last 168 hours. Cardiac Enzymes: No results for input(s): CKTOTAL, CKMB, CKMBINDEX, TROPONINI in the last 168 hours. BNP (last 3 results) No results for input(s): PROBNP in the last 8760  hours. HbA1C: No results for input(s): HGBA1C in the last 72 hours. CBG: No results for input(s): GLUCAP in the last 168 hours. Lipid Profile: No results for input(s): CHOL, HDL, LDLCALC, TRIG, CHOLHDL, LDLDIRECT in the last 72 hours. Thyroid Function Tests: No results for input(s): TSH, T4TOTAL, FREET4, T3FREE, THYROIDAB in the last 72 hours. Anemia Panel: No results for input(s): VITAMINB12, FOLATE, FERRITIN, TIBC, IRON, RETICCTPCT in the last 72 hours. Urine analysis:    Component Value Date/Time   COLORURINE AMBER (A) 12/25/2016 1938   APPEARANCEUR HAZY (  A) 12/25/2016 1938   LABSPEC 1.025 12/25/2016 1938   PHURINE 5.0 12/25/2016 1938   GLUCOSEU NEGATIVE 12/25/2016 1938   HGBUR NEGATIVE 12/25/2016 1938   BILIRUBINUR NEGATIVE 12/25/2016 1938   KETONESUR NEGATIVE 12/25/2016 1938   PROTEINUR NEGATIVE 12/25/2016 1938   NITRITE POSITIVE (A) 12/25/2016 1938   LEUKOCYTESUR SMALL (A) 12/25/2016 1938     Radiological Exams on Admission: Dg Shoulder Right  Result Date: 12/25/2016 CLINICAL DATA:  [place] 3 days ago.  Persistent shoulder pain. EXAM: RIGHT SHOULDER - 2+ VIEW COMPARISON:  None. FINDINGS: Anterior dislocation of the humeral head. Severe remote trauma involving the humeral head, neck and proximal shaft with healed fractures. The right lung is grossly clear. Surgical changes involving the right breast are noted. IMPRESSION: Anterior subcoracoid dislocation of the humeral head. Severe remote trauma. Electronically Signed   By: Marijo Sanes M.D.   On: 12/25/2016 19:16   Ct Head Wo Contrast  Result Date: 12/25/2016 CLINICAL DATA:  Pain after fall. EXAM: CT HEAD WITHOUT CONTRAST CT CERVICAL SPINE WITHOUT CONTRAST TECHNIQUE: Multidetector CT imaging of the head and cervical spine was performed following the standard protocol without intravenous contrast. Multiplanar CT image reconstructions of the cervical spine were also generated. COMPARISON:  None. FINDINGS: CT HEAD FINDINGS  Brain: Right anterior parietal lobe encephalomalacia with associated ex vacuo dilatation of the anterior horn of the right lateral ventricle. Superficial and central atrophy is noted. No acute intracranial hemorrhage, midline shift or edema. No large vascular territory infarct. No effacement of the basal cisterns or fourth ventricle. Vascular: Atherosclerotic calcifications of both vertebral arteries and carotid siphons. No hyperdense vessels. Skull: Right anterior parietal burr hole. No skull fracture or suspicious osseous lesions. Sinuses/Orbits: Right ethmoid sinus mucosal thickening noted posteriorly. No acute sinusitis. Clear mastoids. Other: Non CT CERVICAL SPINE FINDINGS Alignment: Normal cervical lordosis. Intact atlantodental interval with osteoarthritic joint space narrowing. Intact craniocervical relationship. Skull base and vertebrae: No acute fracture. No primary bone lesion or focal pathologic process. Soft tissues and spinal canal: No prevertebral fluid or swelling. No visible canal hematoma. Disc levels: No focal disc herniation or central canal stenosis. No jumped or perched facets. No significant neural foraminal encroachment. Mild multilevel degenerative joint space narrowing and spurring about the facets. Upper chest: Negative. Other: None IMPRESSION: 1. Right anterior parietal encephalomalacia with ex vacuo dilatation of the anterior horn of the right lateral ventricle. This is likely a result of the reported remote traumatic brain injury the patient's history. There is atrophy with chronic small vessel ischemia. No acute intracranial abnormality. 2. No acute cervical spine fracture or posttraumatic subluxation. Mild multilevel facet arthropathy. Electronically Signed   By: Ashley Royalty M.D.   On: 12/25/2016 21:10   Ct Cervical Spine Wo Contrast  Result Date: 12/25/2016 CLINICAL DATA:  Pain after fall. EXAM: CT HEAD WITHOUT CONTRAST CT CERVICAL SPINE WITHOUT CONTRAST TECHNIQUE: Multidetector  CT imaging of the head and cervical spine was performed following the standard protocol without intravenous contrast. Multiplanar CT image reconstructions of the cervical spine were also generated. COMPARISON:  None. FINDINGS: CT HEAD FINDINGS Brain: Right anterior parietal lobe encephalomalacia with associated ex vacuo dilatation of the anterior horn of the right lateral ventricle. Superficial and central atrophy is noted. No acute intracranial hemorrhage, midline shift or edema. No large vascular territory infarct. No effacement of the basal cisterns or fourth ventricle. Vascular: Atherosclerotic calcifications of both vertebral arteries and carotid siphons. No hyperdense vessels. Skull: Right anterior parietal burr hole. No skull fracture or  suspicious osseous lesions. Sinuses/Orbits: Right ethmoid sinus mucosal thickening noted posteriorly. No acute sinusitis. Clear mastoids. Other: Non CT CERVICAL SPINE FINDINGS Alignment: Normal cervical lordosis. Intact atlantodental interval with osteoarthritic joint space narrowing. Intact craniocervical relationship. Skull base and vertebrae: No acute fracture. No primary bone lesion or focal pathologic process. Soft tissues and spinal canal: No prevertebral fluid or swelling. No visible canal hematoma. Disc levels: No focal disc herniation or central canal stenosis. No jumped or perched facets. No significant neural foraminal encroachment. Mild multilevel degenerative joint space narrowing and spurring about the facets. Upper chest: Negative. Other: None IMPRESSION: 1. Right anterior parietal encephalomalacia with ex vacuo dilatation of the anterior horn of the right lateral ventricle. This is likely a result of the reported remote traumatic brain injury the patient's history. There is atrophy with chronic small vessel ischemia. No acute intracranial abnormality. 2. No acute cervical spine fracture or posttraumatic subluxation. Mild multilevel facet arthropathy.  Electronically Signed   By: Ashley Royalty M.D.   On: 12/25/2016 21:10   Dg Hip Unilat W Or Wo Pelvis 2-3 Views Right  Result Date: 12/25/2016 CLINICAL DATA:  Fall 3 days ago. Right hip pain. Initial encounter. EXAM: DG HIP (WITH OR WITHOUT PELVIS) 2-3V RIGHT COMPARISON:  None. FINDINGS: Acute intertrochanteric right femur fracture with varus angulation. Both hips are located. Prominent osteopenia. Moderate stool distending the rectum. IMPRESSION: 1. Intertrochanteric right femur fracture with varus angulation. 2. Marked osteopenia. 3. Prominent rectal stool volume. Electronically Signed   By: Monte Fantasia M.D.   On: 12/25/2016 19:16    EKG: Independently reviewed.  Sinus rhythm.  Poor tracing  Assessment/Plan Active Problems:   Hypertension   Closed TBI (traumatic brain injury) ([place])   Anasarca   Obesity (BMI 30.0-34.9)   Anxiety   Hip fracture ([facility])   Right hip fracture -Orthopedic surgery consulted, Dr. Wynelle Link, would like patient to be admitted at [facility], will see her in the morning -Lyndel Safe perioperative risk greater than 1% -Patient with poor mobility due to the condition, no prior cardiac history/chest pain or dyspnea on exertion -Given reported intermittent anasarca, I would obtain a 2D echo  Acute encephalopathy/history of TBI with recent intraparenchymal hematoma -Patient with mild confusion in the ED, not sure about her baseline, closely monitor -CT scan of the head without acute findings  UTI -Potentially contributing to her encephalopathy, continue antibiotics, urine culture sent  Intermittent anasarca -Patient is on Lasix, I am not able to locate any 2D echoes here nor at [facility], will obtain one in the morning -Currently she has trace lower extremity edema and does not look to be significantly fluid overloaded, hold Lasix preop   DVT prophylaxis: SCD  Code Status: Full code (per nursing home papers) Family Communication: No family at  bedside Disposition Plan: Admit to medsurg Consults called: Orthopedic surgery    Admission status: Inpatient   At the time of admission, it appears that the appropriate admission status for this patient is INPATIENT. This is judged to be reasonable and necessary in order to provide the required high service intensity to ensure the patient's safety given the presenting symptoms, physical exam findings, and initial radiographic and laboratory data in the context of their chronic comorbidities. Current circumstances are a hip fracture requiring operative repair, and it is felt to place patient at high risk for further clinical deterioration threatening life, limb, or organ. Moreover, it is my clinical judgment that the patient will require inpatient hospital care spanning beyond 2  midnights from the point of admission and that early discharge would result in unnecessary risk of decompensation and readmission or threat to life, limb or bodily function.   Marzetta Board, MD [facility] Pager [phone]  If 7PM-7AM, please contact night-coverage [email or web address] Password [facility]  12/25/2016, 9:39 PM

## 2016-12-25 NOTE — ED Notes (Signed)
Bed: HU76 Expected date:  Expected time:  Means of arrival:  Comments: 73 F Fall yesterday R shoulder dislocation, R hip fracture

## 2016-12-26 ENCOUNTER — Inpatient Hospital Stay (HOSPITAL_COMMUNITY): Payer: Medicare Other

## 2016-12-26 ENCOUNTER — Encounter (HOSPITAL_COMMUNITY): Payer: Self-pay

## 2016-12-26 DIAGNOSIS — W19XXXA Unspecified fall, initial encounter: Secondary | ICD-10-CM

## 2016-12-26 DIAGNOSIS — I361 Nonrheumatic tricuspid (valve) insufficiency: Secondary | ICD-10-CM

## 2016-12-26 DIAGNOSIS — S72141A Displaced intertrochanteric fracture of right femur, initial encounter for closed fracture: Principal | ICD-10-CM

## 2016-12-26 LAB — ECHOCARDIOGRAM COMPLETE
HEIGHTINCHES: 65 in
WEIGHTICAEL: 2832 [oz_av]

## 2016-12-26 LAB — CBC
HEMATOCRIT: 31.4 % — AB (ref 36.0–46.0)
HEMOGLOBIN: 10.1 g/dL — AB (ref 12.0–15.0)
MCH: 29.4 pg (ref 26.0–34.0)
MCHC: 32.2 g/dL (ref 30.0–36.0)
MCV: 91.5 fL (ref 78.0–100.0)
Platelets: 176 10*3/uL (ref 150–400)
RBC: 3.43 MIL/uL — ABNORMAL LOW (ref 3.87–5.11)
RDW: 15.2 % (ref 11.5–15.5)
WBC: 10.8 10*3/uL — ABNORMAL HIGH (ref 4.0–10.5)

## 2016-12-26 LAB — COMPREHENSIVE METABOLIC PANEL
ALBUMIN: 2.9 g/dL — AB (ref 3.5–5.0)
ALK PHOS: 63 U/L (ref 38–126)
ALT: 12 U/L — ABNORMAL LOW (ref 14–54)
ANION GAP: 6 (ref 5–15)
AST: 16 U/L (ref 15–41)
BUN: 14 mg/dL (ref 6–20)
CO2: 30 mmol/L (ref 22–32)
Calcium: 8.5 mg/dL — ABNORMAL LOW (ref 8.9–10.3)
Chloride: 106 mmol/L (ref 101–111)
Creatinine, Ser: 0.56 mg/dL (ref 0.44–1.00)
GFR calc Af Amer: >60 mL/min (ref >60)
GFR calc non Af Amer: >60 mL/min (ref >60)
GLUCOSE: 119 mg/dL — AB (ref 65–99)
POTASSIUM: 3.8 mmol/L (ref 3.5–5.1)
SODIUM: 142 mmol/L (ref 135–145)
Total Bilirubin: 0.6 mg/dL (ref 0.3–1.2)
Total Protein: 5.7 g/dL — ABNORMAL LOW (ref 6.5–8.1)

## 2016-12-26 LAB — MRSA PCR SCREENING: MRSA by PCR: NEGATIVE

## 2016-12-26 MED ORDER — DEXTROSE-NACL 5-0.45 % IV SOLN
INTRAVENOUS | Status: DC
  Administered 2016-12-26 (×2): via INTRAVENOUS

## 2016-12-26 MED ORDER — MORPHINE SULFATE (PF) 4 MG/ML IV SOLN
2.0000 mg | INTRAVENOUS | Status: DC | PRN
  Administered 2016-12-26 – 2016-12-27 (×7): 2 mg via INTRAVENOUS
  Filled 2016-12-26 (×7): qty 1

## 2016-12-26 NOTE — Plan of Care (Signed)
Awaiting surgery. Cont turn q2 hours. Assist w/ ADLs. WCTM.

## 2016-12-26 NOTE — Progress Notes (Signed)
[facility] PROGRESS NOTE    Progress Note  Autumn Bryant  UXN:[id number] DOB: 18-Mar-1935 DOA: 12/25/2016 PCP: [name], MD     Brief Narrative:   Autumn Bryant is an 81 y.o. female past medical history significant for  intraparenchymal hematoma in February 2018 at [place], hypertension and obesity comes in after right hip pain after fall the day prior to admission an x-ray showed right hip fracture.  Assessment/Plan:   Closed right hip fracture [facility]): Orthopedic surgery was consulted Dr. Wynelle Link for possible surgical intervention. Imaging recommendations. Her Lyndel Safe Preoperative risk is greater than 1%. Around discussion with her son Autumn Bryant and also the patient and they both agreed to proceed with surgical intervention, I did a splint and the risk and benefits of proceeding with surgery. 2 echo pending  Acute encephalopathy with a history of traumatic brain injury with recent intraparenchymal hematoma: CT scan of the head showed no acute findings.  Urinary tract infection: Probably contributing to her encephalopathy she was started empirically on IV antibiotics culture data is pending.  Intermittent anasarca: Patient was unlike 6 at facility 2-D echo is pending  Obesity (BMI 30.0-34.9)   DVT prophylaxis: lovenox Family Communication:son Disposition Plan/Barrier to D/C: SNF in 3 days Code Status:     Code Status Orders  (From admission, onward)        Start     Ordered   12/25/16 2255  Full code  Continuous     12/25/16 2254    Code Status History    Date Active Date Inactive Code Status Order ID Comments User Context   This patient has a current code status but no historical code status.        IV Access:    Peripheral IV   Procedures and diagnostic studies:   Dg Shoulder Right  Result Date: 12/25/2016 CLINICAL DATA:  [place] 3 days ago.  Persistent shoulder pain. EXAM: RIGHT SHOULDER - 2+ VIEW COMPARISON:   None. FINDINGS: Anterior dislocation of the humeral head. Severe remote trauma involving the humeral head, neck and proximal shaft with healed fractures. The right lung is grossly clear. Surgical changes involving the right breast are noted. IMPRESSION: Anterior subcoracoid dislocation of the humeral head. Severe remote trauma. Electronically Signed   By: Marijo Sanes M.D.   On: 12/25/2016 19:16   Ct Head Wo Contrast  Result Date: 12/25/2016 CLINICAL DATA:  Pain after fall. EXAM: CT HEAD WITHOUT CONTRAST CT CERVICAL SPINE WITHOUT CONTRAST TECHNIQUE: Multidetector CT imaging of the head and cervical spine was performed following the standard protocol without intravenous contrast. Multiplanar CT image reconstructions of the cervical spine were also generated. COMPARISON:  None. FINDINGS: CT HEAD FINDINGS Brain: Right anterior parietal lobe encephalomalacia with associated ex vacuo dilatation of the anterior horn of the right lateral ventricle. Superficial and central atrophy is noted. No acute intracranial hemorrhage, midline shift or edema. No large vascular territory infarct. No effacement of the basal cisterns or fourth ventricle. Vascular: Atherosclerotic calcifications of both vertebral arteries and carotid siphons. No hyperdense vessels. Skull: Right anterior parietal burr hole. No skull fracture or suspicious osseous lesions. Sinuses/Orbits: Right ethmoid sinus mucosal thickening noted posteriorly. No acute sinusitis. Clear mastoids. Other: Non CT CERVICAL SPINE FINDINGS Alignment: Normal cervical lordosis. Intact atlantodental interval with osteoarthritic joint space narrowing. Intact craniocervical relationship. Skull base and vertebrae: No acute fracture. No primary bone lesion or focal pathologic process. Soft tissues and spinal canal: No prevertebral fluid or swelling. No visible  canal hematoma. Disc levels: No focal disc herniation or central canal stenosis. No jumped or perched facets. No  significant neural foraminal encroachment. Mild multilevel degenerative joint space narrowing and spurring about the facets. Upper chest: Negative. Other: None IMPRESSION: 1. Right anterior parietal encephalomalacia with ex vacuo dilatation of the anterior horn of the right lateral ventricle. This is likely a result of the reported remote traumatic brain injury the patient's history. There is atrophy with chronic small vessel ischemia. No acute intracranial abnormality. 2. No acute cervical spine fracture or posttraumatic subluxation. Mild multilevel facet arthropathy. Electronically Signed   By: Ashley Royalty M.D.   On: 12/25/2016 21:10   Ct Cervical Spine Wo Contrast  Result Date: 12/25/2016 CLINICAL DATA:  Pain after fall. EXAM: CT HEAD WITHOUT CONTRAST CT CERVICAL SPINE WITHOUT CONTRAST TECHNIQUE: Multidetector CT imaging of the head and cervical spine was performed following the standard protocol without intravenous contrast. Multiplanar CT image reconstructions of the cervical spine were also generated. COMPARISON:  None. FINDINGS: CT HEAD FINDINGS Brain: Right anterior parietal lobe encephalomalacia with associated ex vacuo dilatation of the anterior horn of the right lateral ventricle. Superficial and central atrophy is noted. No acute intracranial hemorrhage, midline shift or edema. No large vascular territory infarct. No effacement of the basal cisterns or fourth ventricle. Vascular: Atherosclerotic calcifications of both vertebral arteries and carotid siphons. No hyperdense vessels. Skull: Right anterior parietal burr hole. No skull fracture or suspicious osseous lesions. Sinuses/Orbits: Right ethmoid sinus mucosal thickening noted posteriorly. No acute sinusitis. Clear mastoids. Other: Non CT CERVICAL SPINE FINDINGS Alignment: Normal cervical lordosis. Intact atlantodental interval with osteoarthritic joint space narrowing. Intact craniocervical relationship. Skull base and vertebrae: No acute  fracture. No primary bone lesion or focal pathologic process. Soft tissues and spinal canal: No prevertebral fluid or swelling. No visible canal hematoma. Disc levels: No focal disc herniation or central canal stenosis. No jumped or perched facets. No significant neural foraminal encroachment. Mild multilevel degenerative joint space narrowing and spurring about the facets. Upper chest: Negative. Other: None IMPRESSION: 1. Right anterior parietal encephalomalacia with ex vacuo dilatation of the anterior horn of the right lateral ventricle. This is likely a result of the reported remote traumatic brain injury the patient's history. There is atrophy with chronic small vessel ischemia. No acute intracranial abnormality. 2. No acute cervical spine fracture or posttraumatic subluxation. Mild multilevel facet arthropathy. Electronically Signed   By: Ashley Royalty M.D.   On: 12/25/2016 21:10   Dg Hip Unilat W Or Wo Pelvis 2-3 Views Right  Result Date: 12/25/2016 CLINICAL DATA:  Fall 3 days ago. Right hip pain. Initial encounter. EXAM: DG HIP (WITH OR WITHOUT PELVIS) 2-3V RIGHT COMPARISON:  None. FINDINGS: Acute intertrochanteric right femur fracture with varus angulation. Both hips are located. Prominent osteopenia. Moderate stool distending the rectum. IMPRESSION: 1. Intertrochanteric right femur fracture with varus angulation. 2. Marked osteopenia. 3. Prominent rectal stool volume. Electronically Signed   By: Monte Fantasia M.D.   On: 12/25/2016 19:16     Medical Consultants:    None.  Anti-Infectives:   None  Subjective:    Autumn Bryant she relates her pain is controlled.  Objective:    Vitals:   12/25/16 2002 12/25/16 2224 12/25/16 2334 12/26/16 0641  BP:  123/63 137/76 139/88  Pulse:  92 94 98  Resp:  10 14 16   Temp:   98.7 F (37.1 C) 98.6 F (37 C)  TempSrc:   Oral Oral  SpO2:  96% 100%  95%  Weight: 80.3 kg (177 lb)     Height: 5\' 5"  (1.651 m)       Intake/Output Summary  (Last 24 hours) at 12/26/2016 1122 Last data filed at 12/26/2016 7829 Gross per 24 hour  Intake 341.25 ml  Output 150 ml  Net 191.25 ml   Filed Weights   12/25/16 2002  Weight: 80.3 kg (177 lb)    Exam: General exam: In no acute distress. Respiratory system: Good air movement and clear to auscultation. Cardiovascular system: S1 & S2 heard, RRR. No JVD Gastrointestinal system: Abdomen is nondistended, soft and nontender.  Central nervous system: Alert and oriented. No focal neurological deficits. Extremities: No pedal edema. Skin: No rashes, lesions or ulcers   Data Reviewed:    Labs: Basic Metabolic Panel: Recent Labs  Lab 12/25/16 1800 12/26/16 0557  NA 137 142  K 3.6 3.8  CL 103 106  CO2 29 30  GLUCOSE 129* 119*  BUN 14 14  CREATININE 0.52 0.56  CALCIUM 8.5* 8.5*   GFR Estimated Creatinine Clearance: 59.7 mL/min (by C-G formula based on SCr of 0.56 mg/dL). Liver Function Tests: Recent Labs  Lab 12/25/16 1800 12/26/16 0557  AST 21 16  ALT 15 12*  ALKPHOS 69 63  BILITOT 0.7 0.6  PROT 6.0* 5.7*  ALBUMIN 3.2* 2.9*   No results for input(s): LIPASE, AMYLASE in the last 168 hours. No results for input(s): AMMONIA in the last 168 hours. Coagulation profile No results for input(s): INR, PROTIME in the last 168 hours.  CBC: Recent Labs  Lab 12/25/16 1800 12/26/16 0557  WBC 10.6* 10.8*  NEUTROABS 8.7*  --   HGB 11.5* 10.1*  HCT 35.4* 31.4*  MCV 89.6 91.5  PLT 172 176   Cardiac Enzymes: No results for input(s): CKTOTAL, CKMB, CKMBINDEX, TROPONINI in the last 168 hours. BNP (last 3 results) No results for input(s): PROBNP in the last 8760 hours. CBG: No results for input(s): GLUCAP in the last 168 hours. D-Dimer: No results for input(s): DDIMER in the last 72 hours. Hgb A1c: No results for input(s): HGBA1C in the last 72 hours. Lipid Profile: No results for input(s): CHOL, HDL, LDLCALC, TRIG, CHOLHDL, LDLDIRECT in the last 72 hours. Thyroid  function studies: No results for input(s): TSH, T4TOTAL, T3FREE, THYROIDAB in the last 72 hours.  Invalid input(s): FREET3 Anemia work up: No results for input(s): VITAMINB12, FOLATE, FERRITIN, TIBC, IRON, RETICCTPCT in the last 72 hours. Sepsis Labs: Recent Labs  Lab 12/25/16 1800 12/26/16 0557  WBC 10.6* 10.8*   Microbiology Recent Results (from the past 240 hour(s))  MRSA PCR Screening     Status: None   Collection Time: 12/26/16  1:55 AM  Result Value Ref Range Status   MRSA by PCR NEGATIVE NEGATIVE Final    Comment:        The GeneXpert MRSA Assay (FDA approved for NASAL specimens only), is one component of a comprehensive MRSA colonization surveillance program. It is not intended to diagnose MRSA infection nor to guide or monitor treatment for MRSA infections.      Medications:   . escitalopram  5 mg Oral Daily   Continuous Infusions: . cefTRIAXone (ROCEPHIN)  IV    . dextrose 5 % and 0.45% NaCl 75 mL/hr at 12/26/16 0127      LOS: 1 day   Charlynne Cousins  [facility] Pager [phone]  *Please refer to [email or web address], password [email or web address] to get updated schedule on who will round on this patient, as  hospitalists switch teams weekly. If 7PM-7AM, please contact night-coverage at [email or web address], password [email or web address] for any overnight needs.  12/26/2016, 11:22 AM

## 2016-12-26 NOTE — Consult Note (Signed)
Reason for Consult:Right intertrochanteric femur fracture Referring Physician: Dr. Aileen Fass  Autumn Bryant is an 81 y.o. female.  HPI: 81 yo female who had a mechanical fall 2 days ago but does not recall the circumstances associated with the fall. She complains of right leg pain most concentrated at the right hip. She is not having any numbness or tingling. She had increased pain yesterday and was taken to the ED where she was diagnosed with a right intertrochanteric femur fracture. She was admitted to the hospitalist service and we are being consulted for management. She had a recent accident with head injury and has a right humerus fracture which is being managed elsewhere. Her only complaint today is the right hip pain  Past Medical History:  Diagnosis Date  . Anasarca 05/21/2016  . Anxiety 11/03/2016  . Closed TBI (traumatic brain injury) ([place]) 04/21/2016  . Dysphagia 07/13/2016  . Gout   . Hypertension   . Obesity (BMI 30.0-34.9) 05/21/2016  . Status post insertion of percutaneous endoscopic gastrostomy (PEG) tube ([place]) 04/21/2016    Past Surgical History:  Procedure Laterality Date  . MASTECTOMY      Family History  Problem Relation Age of Onset  . Asthma Mother   . Anesthesia problems Neg Hx   . Arthritis Neg Hx   . Cancer Neg Hx   . Cerebral palsy Neg Hx   . Clotting disorder Neg Hx   . Club foot Neg Hx   . Collagen disease Neg Hx   . Deep vein thrombosis Neg Hx   . Gait disorder Neg Hx   . Heart disease Neg Hx   . Gout Neg Hx   . Diabetes Neg Hx   . Hip dysplasia Neg Hx   . Hip fracture Neg Hx   . Hypermobility Neg Hx   . Hypertension Neg Hx   . Osteoporosis Neg Hx   . Other Neg Hx   . Stroke Neg Hx   . Thyroid disease Neg Hx   . Vasculitis Neg Hx   . Spina bifida Neg Hx   . Scoliosis Neg Hx   . Rheumatologic disease Neg Hx   . Pulmonary embolism Neg Hx     Social History:  reports that  has never smoked. she has never used smokeless tobacco. She reports  that she does not drink alcohol or use drugs.  Allergies:  Allergies  Allergen Reactions  . Demerol [Meperidine] Swelling    Medications: I have reviewed the patient's current medications.  Results for orders placed or performed during the hospital encounter of 12/25/16 (from the past 48 hour(s))  CBC with Differential     Status: Abnormal   Collection Time: 12/25/16  6:00 PM  Result Value Ref Range   WBC 10.6 (H) 4.0 - 10.5 K/uL   RBC 3.95 3.87 - 5.11 MIL/uL   Hemoglobin 11.5 (L) 12.0 - 15.0 g/dL   HCT 35.4 (L) 36.0 - 46.0 %   MCV 89.6 78.0 - 100.0 fL   MCH 29.1 26.0 - 34.0 pg   MCHC 32.5 30.0 - 36.0 g/dL   RDW 14.8 11.5 - 15.5 %   Platelets 172 150 - 400 K/uL   Neutrophils Relative % 81 %   Neutro Abs 8.7 (H) 1.7 - 7.7 K/uL   Lymphocytes Relative 13 %   Lymphs Abs 1.3 0.7 - 4.0 K/uL   Monocytes Relative 6 %   Monocytes Absolute 0.6 0.1 - 1.0 K/uL   Eosinophils Relative 0 %  Eosinophils Absolute 0.0 0.0 - 0.7 K/uL   Basophils Relative 0 %   Basophils Absolute 0.0 0.0 - 0.1 K/uL  Comprehensive metabolic panel     Status: Abnormal   Collection Time: 12/25/16  6:00 PM  Result Value Ref Range   Sodium 137 135 - 145 mmol/L   Potassium 3.6 3.5 - 5.1 mmol/L   Chloride 103 101 - 111 mmol/L   CO2 29 22 - 32 mmol/L   Glucose, Bld 129 (H) 65 - 99 mg/dL   BUN 14 6 - 20 mg/dL   Creatinine, Ser 0.52 0.44 - 1.00 mg/dL   Calcium 8.5 (L) 8.9 - 10.3 mg/dL   Total Protein 6.0 (L) 6.5 - 8.1 g/dL   Albumin 3.2 (L) 3.5 - 5.0 g/dL   AST 21 15 - 41 U/L   ALT 15 14 - 54 U/L   Alkaline Phosphatase 69 38 - 126 U/L   Total Bilirubin 0.7 0.3 - 1.2 mg/dL   GFR calc non Af Amer >60 >60 mL/min   GFR calc Af Amer >60 >60 mL/min    Comment: (NOTE) The eGFR has been calculated using the CKD EPI equation. This calculation has not been validated in all clinical situations. eGFR's persistently <60 mL/min signify possible Chronic Kidney Disease.    Anion gap 5 5 - 15  Urinalysis, Routine w  reflex microscopic     Status: Abnormal   Collection Time: 12/25/16  7:38 PM  Result Value Ref Range   Color, Urine AMBER (A) YELLOW    Comment: BIOCHEMICALS MAY BE AFFECTED BY COLOR   APPearance HAZY (A) CLEAR   Specific Gravity, Urine 1.025 1.005 - 1.030   pH 5.0 5.0 - 8.0   Glucose, UA NEGATIVE NEGATIVE mg/dL   Hgb urine dipstick NEGATIVE NEGATIVE   Bilirubin Urine NEGATIVE NEGATIVE   Ketones, ur NEGATIVE NEGATIVE mg/dL   Protein, ur NEGATIVE NEGATIVE mg/dL   Nitrite POSITIVE (A) NEGATIVE   Leukocytes, UA SMALL (A) NEGATIVE   RBC / HPF 6-30 0 - 5 RBC/hpf   WBC, UA 6-30 0 - 5 WBC/hpf   Bacteria, UA MANY (A) NONE SEEN   Squamous Epithelial / LPF NONE SEEN NONE SEEN   Mucus PRESENT   MRSA PCR Screening     Status: None   Collection Time: 12/26/16  1:55 AM  Result Value Ref Range   MRSA by PCR NEGATIVE NEGATIVE    Comment:        The GeneXpert MRSA Assay (FDA approved for NASAL specimens only), is one component of a comprehensive MRSA colonization surveillance program. It is not intended to diagnose MRSA infection nor to guide or monitor treatment for MRSA infections.   Comprehensive metabolic panel     Status: Abnormal   Collection Time: 12/26/16  5:57 AM  Result Value Ref Range   Sodium 142 135 - 145 mmol/L   Potassium 3.8 3.5 - 5.1 mmol/L   Chloride 106 101 - 111 mmol/L   CO2 30 22 - 32 mmol/L   Glucose, Bld 119 (H) 65 - 99 mg/dL   BUN 14 6 - 20 mg/dL   Creatinine, Ser 0.56 0.44 - 1.00 mg/dL   Calcium 8.5 (L) 8.9 - 10.3 mg/dL   Total Protein 5.7 (L) 6.5 - 8.1 g/dL   Albumin 2.9 (L) 3.5 - 5.0 g/dL   AST 16 15 - 41 U/L   ALT 12 (L) 14 - 54 U/L   Alkaline Phosphatase 63 38 - 126 U/L   Total  Bilirubin 0.6 0.3 - 1.2 mg/dL   GFR calc non Af Amer >60 >60 mL/min   GFR calc Af Amer >60 >60 mL/min    Comment: (NOTE) The eGFR has been calculated using the CKD EPI equation. This calculation has not been validated in all clinical situations. eGFR's persistently <60  mL/min signify possible Chronic Kidney Disease.    Anion gap 6 5 - 15  CBC     Status: Abnormal   Collection Time: 12/26/16  5:57 AM  Result Value Ref Range   WBC 10.8 (H) 4.0 - 10.5 K/uL   RBC 3.43 (L) 3.87 - 5.11 MIL/uL   Hemoglobin 10.1 (L) 12.0 - 15.0 g/dL   HCT 31.4 (L) 36.0 - 46.0 %   MCV 91.5 78.0 - 100.0 fL   MCH 29.4 26.0 - 34.0 pg   MCHC 32.2 30.0 - 36.0 g/dL   RDW 15.2 11.5 - 15.5 %   Platelets 176 150 - 400 K/uL    Dg Shoulder Right  Result Date: 12/25/2016 CLINICAL DATA:  [place] 3 days ago.  Persistent shoulder pain. EXAM: RIGHT SHOULDER - 2+ VIEW COMPARISON:  None. FINDINGS: Anterior dislocation of the humeral head. Severe remote trauma involving the humeral head, neck and proximal shaft with healed fractures. The right lung is grossly clear. Surgical changes involving the right breast are noted. IMPRESSION: Anterior subcoracoid dislocation of the humeral head. Severe remote trauma. Electronically Signed   By: Marijo Sanes M.D.   On: 12/25/2016 19:16   Ct Head Wo Contrast  Result Date: 12/25/2016 CLINICAL DATA:  Pain after fall. EXAM: CT HEAD WITHOUT CONTRAST CT CERVICAL SPINE WITHOUT CONTRAST TECHNIQUE: Multidetector CT imaging of the head and cervical spine was performed following the standard protocol without intravenous contrast. Multiplanar CT image reconstructions of the cervical spine were also generated. COMPARISON:  None. FINDINGS: CT HEAD FINDINGS Brain: Right anterior parietal lobe encephalomalacia with associated ex vacuo dilatation of the anterior horn of the right lateral ventricle. Superficial and central atrophy is noted. No acute intracranial hemorrhage, midline shift or edema. No large vascular territory infarct. No effacement of the basal cisterns or fourth ventricle. Vascular: Atherosclerotic calcifications of both vertebral arteries and carotid siphons. No hyperdense vessels. Skull: Right anterior parietal burr hole. No skull fracture or suspicious osseous  lesions. Sinuses/Orbits: Right ethmoid sinus mucosal thickening noted posteriorly. No acute sinusitis. Clear mastoids. Other: Non CT CERVICAL SPINE FINDINGS Alignment: Normal cervical lordosis. Intact atlantodental interval with osteoarthritic joint space narrowing. Intact craniocervical relationship. Skull base and vertebrae: No acute fracture. No primary bone lesion or focal pathologic process. Soft tissues and spinal canal: No prevertebral fluid or swelling. No visible canal hematoma. Disc levels: No focal disc herniation or central canal stenosis. No jumped or perched facets. No significant neural foraminal encroachment. Mild multilevel degenerative joint space narrowing and spurring about the facets. Upper chest: Negative. Other: None IMPRESSION: 1. Right anterior parietal encephalomalacia with ex vacuo dilatation of the anterior horn of the right lateral ventricle. This is likely a result of the reported remote traumatic brain injury the patient's history. There is atrophy with chronic small vessel ischemia. No acute intracranial abnormality. 2. No acute cervical spine fracture or posttraumatic subluxation. Mild multilevel facet arthropathy. Electronically Signed   By: Ashley Royalty M.D.   On: 12/25/2016 21:10   Ct Cervical Spine Wo Contrast  Result Date: 12/25/2016 CLINICAL DATA:  Pain after fall. EXAM: CT HEAD WITHOUT CONTRAST CT CERVICAL SPINE WITHOUT CONTRAST TECHNIQUE: Multidetector CT imaging of the head  and cervical spine was performed following the standard protocol without intravenous contrast. Multiplanar CT image reconstructions of the cervical spine were also generated. COMPARISON:  None. FINDINGS: CT HEAD FINDINGS Brain: Right anterior parietal lobe encephalomalacia with associated ex vacuo dilatation of the anterior horn of the right lateral ventricle. Superficial and central atrophy is noted. No acute intracranial hemorrhage, midline shift or edema. No large vascular territory infarct. No  effacement of the basal cisterns or fourth ventricle. Vascular: Atherosclerotic calcifications of both vertebral arteries and carotid siphons. No hyperdense vessels. Skull: Right anterior parietal burr hole. No skull fracture or suspicious osseous lesions. Sinuses/Orbits: Right ethmoid sinus mucosal thickening noted posteriorly. No acute sinusitis. Clear mastoids. Other: Non CT CERVICAL SPINE FINDINGS Alignment: Normal cervical lordosis. Intact atlantodental interval with osteoarthritic joint space narrowing. Intact craniocervical relationship. Skull base and vertebrae: No acute fracture. No primary bone lesion or focal pathologic process. Soft tissues and spinal canal: No prevertebral fluid or swelling. No visible canal hematoma. Disc levels: No focal disc herniation or central canal stenosis. No jumped or perched facets. No significant neural foraminal encroachment. Mild multilevel degenerative joint space narrowing and spurring about the facets. Upper chest: Negative. Other: None IMPRESSION: 1. Right anterior parietal encephalomalacia with ex vacuo dilatation of the anterior horn of the right lateral ventricle. This is likely a result of the reported remote traumatic brain injury the patient's history. There is atrophy with chronic small vessel ischemia. No acute intracranial abnormality. 2. No acute cervical spine fracture or posttraumatic subluxation. Mild multilevel facet arthropathy. Electronically Signed   By: Ashley Royalty M.D.   On: 12/25/2016 21:10   Dg Hip Unilat W Or Wo Pelvis 2-3 Views Right  Result Date: 12/25/2016 CLINICAL DATA:  Fall 3 days ago. Right hip pain. Initial encounter. EXAM: DG HIP (WITH OR WITHOUT PELVIS) 2-3V RIGHT COMPARISON:  None. FINDINGS: Acute intertrochanteric right femur fracture with varus angulation. Both hips are located. Prominent osteopenia. Moderate stool distending the rectum. IMPRESSION: 1. Intertrochanteric right femur fracture with varus angulation. 2. Marked  osteopenia. 3. Prominent rectal stool volume. Electronically Signed   By: Monte Fantasia M.D.   On: 12/25/2016 19:16    ROS Blood pressure 139/88, pulse 98, temperature 98.6 F (37 C), temperature source Oral, resp. rate 16, height 5' 5"  (1.651 m), weight 177 lb (80.3 kg), SpO2 95 %. Physical Exam  WD WN female alert but oriented only to person and place RLE short and externally rotated Able to wiggle toes and plantar/dorsiflex at ankle, pulse intact, sensation intact  Assessment/Plan: Right intertrochanteric femur fracture- Will need operative fixation. Plan on surgery tomorrow. I will ask my partner on call tomorrow to do the surgery.  Pilar Plate Dalinda Heidt 12/26/2016, 7:30 AM

## 2016-12-26 NOTE — Progress Notes (Signed)
  Echocardiogram 2D Echocardiogram has been performed.  Merrie Roof F 12/26/2016, 10:29 AM

## 2016-12-27 ENCOUNTER — Inpatient Hospital Stay (HOSPITAL_COMMUNITY): Payer: Medicare Other | Admitting: Certified Registered Nurse Anesthetist

## 2016-12-27 ENCOUNTER — Inpatient Hospital Stay (HOSPITAL_COMMUNITY): Payer: Medicare Other

## 2016-12-27 ENCOUNTER — Encounter (HOSPITAL_COMMUNITY): Payer: Self-pay | Admitting: Certified Registered Nurse Anesthetist

## 2016-12-27 ENCOUNTER — Encounter (HOSPITAL_COMMUNITY)
Admission: EM | Disposition: A | Discharge status: Skilled Nursing Facility | Payer: Self-pay | Source: Home / Self Care | Attending: Internal Medicine

## 2016-12-27 HISTORY — PX: INTRAMEDULLARY (IM) NAIL INTERTROCHANTERIC: SHX5875

## 2016-12-27 LAB — ABO/RH: ABO/RH(D): A POS

## 2016-12-27 SURGERY — FIXATION, FRACTURE, INTERTROCHANTERIC, WITH INTRAMEDULLARY ROD
Anesthesia: General | Laterality: Right

## 2016-12-27 MED ORDER — FENTANYL CITRATE (PF) 100 MCG/2ML IJ SOLN: 25.0000 ug | INTRAMUSCULAR | Status: DC | PRN

## 2016-12-27 MED ORDER — PROMETHAZINE HCL 25 MG/ML IJ SOLN: 6.2500 mg | INTRAMUSCULAR | Status: DC | PRN

## 2016-12-27 MED ORDER — CHLORHEXIDINE GLUCONATE 4 % EX LIQD: 60.0000 mL | Freq: Once | CUTANEOUS | Status: DC

## 2016-12-27 MED ORDER — ACETAMINOPHEN 325 MG PO TABS: 650.0000 mg | ORAL_TABLET | Freq: Four times a day (QID) | ORAL | Status: DC | PRN

## 2016-12-27 MED ORDER — ROCURONIUM BROMIDE 10 MG/ML (PF) SYRINGE
PREFILLED_SYRINGE | INTRAVENOUS | Status: DC | PRN
  Administered 2016-12-27: 50 mg via INTRAVENOUS

## 2016-12-27 MED ORDER — SODIUM CHLORIDE 0.9 % IV SOLN
INTRAVENOUS | Status: DC
  Administered 2016-12-27: 17:00:00 via INTRAVENOUS

## 2016-12-27 MED ORDER — FERROUS SULFATE 325 (65 FE) MG PO TABS
325.0000 mg | ORAL_TABLET | Freq: Three times a day (TID) | ORAL | Status: DC
  Administered 2016-12-28 – 2016-12-29 (×4): 325 mg via ORAL
  Filled 2016-12-27 (×4): qty 1

## 2016-12-27 MED ORDER — CEFAZOLIN SODIUM-DEXTROSE 2-4 GM/100ML-% IV SOLN
INTRAVENOUS | Status: AC
  Filled 2016-12-27: qty 100

## 2016-12-27 MED ORDER — ACETAMINOPHEN 650 MG RE SUPP: 650.0000 mg | Freq: Four times a day (QID) | RECTAL | Status: DC | PRN

## 2016-12-27 MED ORDER — CEFAZOLIN SODIUM-DEXTROSE 2-3 GM-%(50ML) IV SOLR: 2.0000 g | Freq: Once | INTRAVENOUS | Status: DC

## 2016-12-27 MED ORDER — SODIUM CHLORIDE 0.9 % IV SOLN: INTRAVENOUS | Status: DC

## 2016-12-27 MED ORDER — MENTHOL 3 MG MT LOZG: 1.0000 | LOZENGE | OROMUCOSAL | Status: DC | PRN

## 2016-12-27 MED ORDER — CEFAZOLIN SODIUM-DEXTROSE 2-4 GM/100ML-% IV SOLN: 2.0000 g | INTRAVENOUS | Status: DC

## 2016-12-27 MED ORDER — GABAPENTIN 100 MG PO CAPS
200.0000 mg | ORAL_CAPSULE | Freq: Three times a day (TID) | ORAL | Status: DC
  Administered 2016-12-27 – 2016-12-29 (×6): 200 mg via ORAL
  Filled 2016-12-27 (×5): qty 2

## 2016-12-27 MED ORDER — FENTANYL CITRATE (PF) 100 MCG/2ML IJ SOLN
INTRAMUSCULAR | Status: AC
  Filled 2016-12-27: qty 2

## 2016-12-27 MED ORDER — SUCCINYLCHOLINE CHLORIDE 200 MG/10ML IV SOSY
PREFILLED_SYRINGE | INTRAVENOUS | Status: AC
  Filled 2016-12-27: qty 10

## 2016-12-27 MED ORDER — METHOCARBAMOL 500 MG PO TABS
500.0000 mg | ORAL_TABLET | Freq: Four times a day (QID) | ORAL | Status: DC | PRN
  Administered 2016-12-29: 500 mg via ORAL
  Filled 2016-12-27: qty 1

## 2016-12-27 MED ORDER — SODIUM CHLORIDE 0.9 % IV SOLN: Freq: Once | INTRAVENOUS | Status: DC

## 2016-12-27 MED ORDER — LIDOCAINE 2% (20 MG/ML) 5 ML SYRINGE
INTRAMUSCULAR | Status: AC
  Filled 2016-12-27: qty 5

## 2016-12-27 MED ORDER — ROCURONIUM BROMIDE 50 MG/5ML IV SOSY
PREFILLED_SYRINGE | INTRAVENOUS | Status: AC
  Filled 2016-12-27: qty 5

## 2016-12-27 MED ORDER — FENTANYL CITRATE (PF) 100 MCG/2ML IJ SOLN
INTRAMUSCULAR | Status: DC | PRN
Start: 2016-12-27 — End: 2016-12-27
  Administered 2016-12-27: 50 ug via INTRAVENOUS
  Administered 2016-12-27: 100 ug via INTRAVENOUS
  Administered 2016-12-27: 50 ug via INTRAVENOUS

## 2016-12-27 MED ORDER — PHENYLEPHRINE 40 MCG/ML (10ML) SYRINGE FOR IV PUSH (FOR BLOOD PRESSURE SUPPORT)
PREFILLED_SYRINGE | INTRAVENOUS | Status: DC | PRN
  Administered 2016-12-27 (×2): 80 ug via INTRAVENOUS
  Administered 2016-12-27: 120 ug via INTRAVENOUS
  Administered 2016-12-27: 80 ug via INTRAVENOUS

## 2016-12-27 MED ORDER — DEXAMETHASONE SODIUM PHOSPHATE 10 MG/ML IJ SOLN
INTRAMUSCULAR | Status: AC
  Filled 2016-12-27: qty 1

## 2016-12-27 MED ORDER — HYDROCODONE-ACETAMINOPHEN 5-325 MG PO TABS
1.0000 | ORAL_TABLET | Freq: Four times a day (QID) | ORAL | Status: DC | PRN
  Administered 2016-12-27 – 2016-12-29 (×3): 1 via ORAL
  Filled 2016-12-27 (×3): qty 1

## 2016-12-27 MED ORDER — PHENYLEPHRINE 40 MCG/ML (10ML) SYRINGE FOR IV PUSH (FOR BLOOD PRESSURE SUPPORT)
PREFILLED_SYRINGE | INTRAVENOUS | Status: AC
  Filled 2016-12-27: qty 10

## 2016-12-27 MED ORDER — ONDANSETRON HCL 4 MG/2ML IJ SOLN
4.0000 mg | Freq: Four times a day (QID) | INTRAMUSCULAR | Status: DC | PRN
Start: 2016-12-27 — End: 2016-12-29

## 2016-12-27 MED ORDER — ONDANSETRON HCL 4 MG PO TABS: 4.0000 mg | ORAL_TABLET | Freq: Four times a day (QID) | ORAL | Status: DC | PRN

## 2016-12-27 MED ORDER — POLYETHYLENE GLYCOL 3350 17 G PO PACK: 17.0000 g | PACK | Freq: Every day | ORAL | Status: DC | PRN

## 2016-12-27 MED ORDER — METOCLOPRAMIDE HCL 5 MG/ML IJ SOLN
5.0000 mg | Freq: Three times a day (TID) | INTRAMUSCULAR | Status: DC | PRN
Start: 2016-12-27 — End: 2016-12-29

## 2016-12-27 MED ORDER — ASPIRIN EC 325 MG PO TBEC
325.0000 mg | DELAYED_RELEASE_TABLET | Freq: Every day | ORAL | Status: DC
  Administered 2016-12-28 – 2016-12-29 (×2): 325 mg via ORAL
  Filled 2016-12-27 (×2): qty 1

## 2016-12-27 MED ORDER — ALBUMIN HUMAN 5 % IV SOLN
INTRAVENOUS | Status: AC
Start: 2016-12-27 — End: ?
  Filled 2016-12-27: qty 250

## 2016-12-27 MED ORDER — ALUM & MAG HYDROXIDE-SIMETH 200-200-20 MG/5ML PO SUSP: 30.0000 mL | ORAL | Status: DC | PRN

## 2016-12-27 MED ORDER — CEFAZOLIN SODIUM-DEXTROSE 2-4 GM/100ML-% IV SOLN
2.0000 g | INTRAVENOUS | Status: AC
  Administered 2016-12-27: 2 g via INTRAVENOUS

## 2016-12-27 MED ORDER — PHENOL 1.4 % MT LIQD: 1.0000 | OROMUCOSAL | Status: DC | PRN

## 2016-12-27 MED ORDER — ONDANSETRON HCL 4 MG/2ML IJ SOLN
INTRAMUSCULAR | Status: AC
  Filled 2016-12-27: qty 2

## 2016-12-27 MED ORDER — SUGAMMADEX SODIUM 200 MG/2ML IV SOLN
INTRAVENOUS | Status: DC | PRN
  Administered 2016-12-27: 160 mg via INTRAVENOUS

## 2016-12-27 MED ORDER — ALBUMIN HUMAN 5 % IV SOLN
INTRAVENOUS | Status: DC | PRN
  Administered 2016-12-27: 11:00:00 via INTRAVENOUS

## 2016-12-27 MED ORDER — PROPOFOL 10 MG/ML IV BOLUS
INTRAVENOUS | Status: AC
  Filled 2016-12-27: qty 20

## 2016-12-27 MED ORDER — POVIDONE-IODINE 10 % EX SWAB: 2.0000 "application " | Freq: Once | CUTANEOUS | Status: DC

## 2016-12-27 MED ORDER — DEXAMETHASONE SODIUM PHOSPHATE 10 MG/ML IJ SOLN
INTRAMUSCULAR | Status: DC | PRN
  Administered 2016-12-27: 10 mg via INTRAVENOUS

## 2016-12-27 MED ORDER — METHOCARBAMOL 1000 MG/10ML IJ SOLN
500.0000 mg | Freq: Four times a day (QID) | INTRAMUSCULAR | Status: DC | PRN
  Administered 2016-12-27: 500 mg via INTRAVENOUS
  Filled 2016-12-27: qty 550

## 2016-12-27 MED ORDER — STERILE WATER FOR IRRIGATION IR SOLN
Status: DC | PRN
Start: 2016-12-27 — End: 2016-12-27
  Administered 2016-12-27: 2000 mL

## 2016-12-27 MED ORDER — PROPOFOL 10 MG/ML IV BOLUS
INTRAVENOUS | Status: DC | PRN
  Administered 2016-12-27: 110 mg via INTRAVENOUS

## 2016-12-27 MED ORDER — METOCLOPRAMIDE HCL 5 MG PO TABS: 5.0000 mg | ORAL_TABLET | Freq: Three times a day (TID) | ORAL | Status: DC | PRN

## 2016-12-27 MED ORDER — ONDANSETRON HCL 4 MG/2ML IJ SOLN
INTRAMUSCULAR | Status: DC | PRN
  Administered 2016-12-27: 4 mg via INTRAVENOUS

## 2016-12-27 MED ORDER — DOCUSATE SODIUM 100 MG PO CAPS
100.0000 mg | ORAL_CAPSULE | Freq: Two times a day (BID) | ORAL | Status: DC
  Administered 2016-12-27 – 2016-12-29 (×4): 100 mg via ORAL
  Filled 2016-12-27 (×4): qty 1

## 2016-12-27 MED ORDER — 0.9 % SODIUM CHLORIDE (POUR BTL) OPTIME
TOPICAL | Status: DC | PRN
Start: 2016-12-27 — End: 2016-12-27
  Administered 2016-12-27: 1000 mL

## 2016-12-27 MED ORDER — CEFAZOLIN SODIUM-DEXTROSE 2-4 GM/100ML-% IV SOLN: 2.0000 g | Freq: Four times a day (QID) | INTRAVENOUS | Status: DC

## 2016-12-27 MED ORDER — LIDOCAINE 2% (20 MG/ML) 5 ML SYRINGE
INTRAMUSCULAR | Status: DC | PRN
  Administered 2016-12-27: 60 mg via INTRAVENOUS

## 2016-12-27 MED ORDER — LACTATED RINGERS IV SOLN
INTRAVENOUS | Status: DC
  Administered 2016-12-27 (×2): via INTRAVENOUS

## 2016-12-27 SURGICAL SUPPLY — 46 items
BAG ZIPLOCK 12X15 (MISCELLANEOUS) IMPLANT
BIT DRILL 4.3MMS DISTAL GRDTED (BIT) ×1 IMPLANT
BIT DRILL CANN LG 4.3MM (BIT) ×1 IMPLANT
BLADE SAW SGTL 11.0X1.19X90.0M (BLADE) IMPLANT
BNDG GAUZE ELAST 4 BULKY (GAUZE/BANDAGES/DRESSINGS) IMPLANT
COVER PERINEAL POST (MISCELLANEOUS) ×3 IMPLANT
COVER SURGICAL LIGHT HANDLE (MISCELLANEOUS) ×3 IMPLANT
DRAPE INCISE IOBAN 66X45 STRL (DRAPES) ×3 IMPLANT
DRAPE STERI IOBAN 125X83 (DRAPES) ×3 IMPLANT
DRILL 4.3MMS DISTAL GRADUATED (BIT) ×3
DRILL BIT CANN LG 4.3MM (BIT) ×3
DRSG AQUACEL AG ADV 3.5X 4 (GAUZE/BANDAGES/DRESSINGS) IMPLANT
DRSG AQUACEL AG ADV 3.5X 6 (GAUZE/BANDAGES/DRESSINGS) IMPLANT
DRSG MEPILEX BORDER 4X12 (GAUZE/BANDAGES/DRESSINGS) ×3 IMPLANT
DRSG MEPILEX BORDER 4X8 (GAUZE/BANDAGES/DRESSINGS) ×6 IMPLANT
DURAPREP 26ML APPLICATOR (WOUND CARE) ×3 IMPLANT
ELECT REM PT RETURN 15FT ADLT (MISCELLANEOUS) ×3 IMPLANT
GAUZE SPONGE 4X4 12PLY STRL (GAUZE/BANDAGES/DRESSINGS) IMPLANT
GLOVE BIOGEL M 7.0 STRL (GLOVE) IMPLANT
GLOVE BIOGEL PI IND STRL 7.5 (GLOVE) ×1 IMPLANT
GLOVE BIOGEL PI IND STRL 8.5 (GLOVE) ×1 IMPLANT
GLOVE BIOGEL PI INDICATOR 7.5 (GLOVE) ×2
GLOVE BIOGEL PI INDICATOR 8.5 (GLOVE) ×2
GLOVE ECLIPSE 8.0 STRL XLNG CF (GLOVE) IMPLANT
GLOVE ORTHO TXT STRL SZ7.5 (GLOVE) ×6 IMPLANT
GLOVE SURG ORTHO 8.0 STRL STRW (GLOVE) ×3 IMPLANT
GOWN STRL REUS W/TWL LRG LVL3 (GOWN DISPOSABLE) ×3 IMPLANT
GOWN STRL REUS W/TWL XL LVL3 (GOWN DISPOSABLE) ×6 IMPLANT
GUIDEPIN 3.2X17.5 THRD DISP (PIN) ×6 IMPLANT
GUIDEWIRE BALL NOSE 80CM (WIRE) ×3 IMPLANT
HFN RH 130 DEG 11MM X 360MM (Orthopedic Implant) ×3 IMPLANT
HIP FRAC NAIL LAG SCR 10.5X100 (Orthopedic Implant) ×2 IMPLANT
KIT BASIN OR (CUSTOM PROCEDURE TRAY) ×3 IMPLANT
MANIFOLD NEPTUNE II (INSTRUMENTS) ×3 IMPLANT
PACK GENERAL/GYN (CUSTOM PROCEDURE TRAY) ×3 IMPLANT
POSITIONER SURGICAL ARM (MISCELLANEOUS) ×3 IMPLANT
SCREW BONE CORTICAL 5.0X42 (Screw) ×3 IMPLANT
SCREW BONE CORTICAL 5.0X44 (Screw) ×3 IMPLANT
SCREW CANN THRD AFF 10.5X100 (Orthopedic Implant) ×1 IMPLANT
SCREWDRIVER HEX TIP 3.5MM (MISCELLANEOUS) ×3 IMPLANT
STAPLER VISISTAT 35W (STAPLE) ×3 IMPLANT
SUT VIC AB 1 CT1 27 (SUTURE) ×2
SUT VIC AB 1 CT1 27XBRD ANTBC (SUTURE) ×1 IMPLANT
SUT VIC AB 2-0 CT1 27 (SUTURE) ×4
SUT VIC AB 2-0 CT1 27XBRD (SUTURE) ×2 IMPLANT
YANKAUER SUCT BULB TIP 10FT TU (MISCELLANEOUS) ×3 IMPLANT

## 2016-12-27 NOTE — Anesthesia Preprocedure Evaluation (Addendum)
Anesthesia Evaluation  Patient identified by MRN, date of birth, ID band Patient confused    Reviewed: Allergy & Precautions, NPO status , Patient's Chart, lab work & pertinent test results  Airway Mallampati: II  TM Distance: >3 FB Neck ROM: Full    Dental no notable dental hx. (+) Dental Advisory Given   Pulmonary neg pulmonary ROS,    Pulmonary exam normal        Cardiovascular hypertension, Normal cardiovascular exam  Impressions:  - Vigorous LV systolic function; mild diastolic dysfunction; mildly   elevated LVOT velocity likely related to vigorous LV function;   mild LAE; mild TR; possible catheter vs prominent eustachian   valve noted in RA.    Neuro/Psych Anxiety TBI 04/21/2016    GI/Hepatic negative GI ROS, Neg liver ROS,   Endo/Other  negative endocrine ROS  Renal/GU negative Renal ROS     Musculoskeletal negative musculoskeletal ROS (+)   Abdominal   Peds  Hematology negative hematology ROS (+)   Anesthesia Other Findings Day of surgery medications reviewed with the patient.  Reproductive/Obstetrics                            Anesthesia Physical Anesthesia Plan  ASA: III  Anesthesia Plan: General   Post-op Pain Management:    Induction: Intravenous  PONV Risk Score and Plan: 3 and Ondansetron, Dexamethasone and Treatment may vary due to age or medical condition  Airway Management Planned: Oral ETT  Additional Equipment:   Intra-op Plan:   Post-operative Plan: Extubation in OR  Informed Consent: I have reviewed the patients History and Physical, chart, labs and discussed the procedure including the risks, benefits and alternatives for the proposed anesthesia with the patient or authorized representative who has indicated his/her understanding and acceptance.   Consent reviewed with POA  Plan Discussed with: CRNA, Anesthesiologist and Surgeon  Anesthesia Plan  Comments: (Discussed with son via telephone.)      Anesthesia Quick Evaluation

## 2016-12-27 NOTE — Op Note (Signed)
NAMESTEPANIE, Autumn Bryant               ACCOUNT NO.:  [id number]  MEDICAL RECORD NO.:  [id number]  LOCATION:                                 FACILITY:  PHYSICIAN:  Pietro Cassis. Alvan Dame, M.D.       DATE OF BIRTH:  DATE OF PROCEDURE:  12/27/2016 DATE OF DISCHARGE:                              OPERATIVE REPORT   PREOPERATIVE DIAGNOSIS:  Comminuted complex right closed peritrochanteric femur fracture.  POSTOPERATIVE DIAGNOSIS:  Comminuted complex right closed peritrochanteric femur fracture.  PROCEDURE:  Open reduction internal fixation of a right comminuted peritrochanteric femur fracture utilizing the long VersaNail 11 x 360 mm with a proximal lag screw locked and 2 distal interlocks.  SURGEON:  Pietro Cassis. Alvan Dame, M.D.  ASSISTANT:  Surgical team.  ANESTHESIA:  General.  SPECIMEN:  None.  COMPLICATIONS:  None apparent, other than the findings of severe osteoporosis in very fragile bone.  INDICATION FOR PROCEDURE:  Autumn Bryant is a 81 year old female, who currently resides in a nursing facility based on significant functional limitations according to her son due to bilateral knee advanced osteoarthritis and pain.  Upon transferring from a wheelchair to bed, she fell and had immediate onset of pain.  She was brought to the emergency room, revealed a peritrochanteric femur fracture.  I reviewed indications of the procedure, the risks of nonunion, malunion, need for future surgery, the effects of the surgery, and injury on her functional ability already limited based on her knee arthritis.  Consent was obtained for benefit of pain relief.  PROCEDURE IN DETAIL:  The patient was brought to the operative theater. Once adequate anesthesia, preoperative antibiotics, Ancef administered on top of previously prescribed Rocephin for urinary tract infection. She was positioned supine on the Hana table.  Once she was adequately positioned with bony prominences padded, fluoroscopy was used to  confirm at least an adequate preprocedure reduction with traction and internal rotation.  Once this was done, the right hip was prepped and draped in sterile fashion to below the knee to allow for a long fracture of the lateral side of the hip below the lesser trochanter.  A time-out was performed identifying the patient, planned procedure and extremity.  Fluoroscopy was brought back into the field.  Landmarks were identified.  I initially made an incision over the lateral side of the hip.  The tip of the trochanter was identified.  A guidewire was inserted, but noted significant comminution of the peritrochanteric region.  I then drilled the proximal femur and then passed through a starting awl, the ball-tipped guidewire and then confirmed this was inside the femur in the AP and lateral planes into the knee distally, I then measured and decided to use a 36 cm nail.  The 11 x 360 mm nail was then opened for this right hip.  It was then inserted over the guidewire.  Again, her bone quality is noted to be very poor.  I had it in its best nearly anatomic position, and then using the guidewire, I placed it slightly inferior in the neck and slightly posterior to provide some stability.  I then measured and selected a 100 mm lag screw.  I then passed  the lag screw and then locked this down.  Based on the comminution of this proximal femur, as I did not feel that it would be best for her to have it continued to compress.  Once it was locked into place, the proximal insertion jig was removed.  Then under perfect circle techniques distally, I placed 2 screws.  I initially placed 1 into the dynamic slot; however, the bone in this area was noted to be so fragile that the screw had penetrated her lateral cortex.  I backed that screw out a little bit, left it in place and then placed 1 into the static and again using perfect circle technique.  With the 2 screws in place, we took final x-rays in  AP and lateral plane.  I then irrigated all wounds.  The distal 2 incisions were closed with staples.  I then used 2-0 Vicryl in the subcutaneous layer of the proximal 2 incisions and closed this with staples as well.  The hip was then cleaned, dried, and dressed sterilely with Mepilex dressings.  She was then brought to the recovery room, extubated in stable condition, tolerating the procedure well.  The findings were reviewed with her son, her power of attorney.     Pietro Cassis Alvan Dame, M.D.     MDO/MEDQ  D:  12/27/2016  T:  12/27/2016  Job:  [id number]

## 2016-12-27 NOTE — Progress Notes (Signed)
Nutrition Brief Note  Patient identified on the Malnutrition Screening Tool (MST) Report  Pt admitted for closed right hip fracture and presented for INTRAMEDULLARY (IM) NAIL INTERTROCHANTRIC RIGHT HIP today (11/23).   Wt Readings from Last 15 Encounters:  12/25/16 177 lb (80.3 kg)  12/25/16 177 lb 12.8 oz (80.6 kg)  11/26/16 169 lb (76.7 kg)  11/08/16 170 lb (77.1 kg)  10/11/16 178 lb 9.6 oz (81 kg)  09/16/16 178 lb (80.7 kg)  08/14/16 179 lb (81.2 kg)  08/02/16 184 lb (83.5 kg)  07/11/16 183 lb 9.6 oz (83.3 kg)  06/12/16 183 lb 9.6 oz (83.3 kg)  05/21/16 183 lb 6.4 oz (83.2 kg)  05/20/16 183 lb 6.4 oz (83.2 kg)  04/16/16 208 lb 8.9 oz (94.6 kg)    Body mass index is 29.45 kg/m. Patient meets criteria for overweight based on current BMI. Pt reports no known weight loss. Per chart, pt's weight seems fairly stable.   Current diet order is regular, patient has not consumed any meals at this time d/t surgery, however diet was advanced 11/23 at 1508 and pt reports being hungry. Pt reports good intake PTA.  Nutrition focused physical exam completed. Findings include no fat or muscle depletions.  Labs and medications reviewed.   No nutrition interventions warranted at this time. If nutrition issues arise, please consult RD.   Parks Ranger, MS, RDN, LDN 12/27/2016 3:42 PM

## 2016-12-27 NOTE — Transfer of Care (Signed)
Immediate Anesthesia Transfer of Care Note  Patient: Autumn Bryant  Procedure(s) Performed: INTRAMEDULLARY (IM) NAIL INTERTROCHANTRIC RIGHT HIP (Right )  Patient Location: PACU  Anesthesia Type:General  Level of Consciousness: sedated  Airway & Oxygen Therapy: Patient Spontanous Breathing and Patient connected to face mask oxygen  Post-op Assessment: Report given to RN and Post -op Vital signs reviewed and stable  Post vital signs: Reviewed and stable  Last Vitals:  Vitals:   12/27/16 0548 12/27/16 0841  BP: (!) 112/93 (!) 123/56  Pulse: 62 (!) 103  Resp: 16 17  Temp: 37.4 C 36.7 C  SpO2: 95% 93%    Last Pain:  Vitals:   12/27/16 0841  TempSrc: Oral  PainSc:       Patients Stated Pain Goal: 2 (16/10/96 0454)  Complications: No apparent anesthesia complications

## 2016-12-27 NOTE — Progress Notes (Signed)
[facility] PROGRESS NOTE    Progress Note  Autumn Bryant  ZOX:[id number] DOB: [birth year] DOA: 12/25/2016 PCP: [name], MD     Brief Narrative:   Autumn Bryant is an 81 y.o. female past medical history significant for  intraparenchymal hematoma in February 2018 at [facility], hypertension and obesity comes in after right hip pain after fall the day prior to admission an x-ray showed right hip fracture.  Assessment/Plan:   Closed right hip fracture [facility]): Orthopedic surgery was consulted Dr. Wynelle Link for possible surgical intervention.  Pain is controlled.  Acute encephalopathy with a history of traumatic brain injury with recent intraparenchymal hematoma: CT scan of the head showed no acute findings. Now resolved.  Urinary tract infection: Her data showed greater than 100,000 colonies of gram-negative rods continue IV Rocephin.  Intermittent anasarca: Patient was unlike 6 at facility 2-D echo is pending  Obesity (BMI 30.0-34.9)   DVT prophylaxis: lovenox Family Communication:son Disposition Plan/Barrier to D/C: SNF in 2 days Code Status:     Code Status Orders  (From admission, onward)        Start     Ordered   12/25/16 2255  Full code  Continuous     12/25/16 2254    Code Status History    Date Active Date Inactive Code Status Order ID Comments User Context   This patient has a current code status but no historical code status.        IV Access:    Peripheral IV   Procedures and diagnostic studies:   Dg Shoulder Right  Result Date: 12/25/2016 CLINICAL DATA:  [place] 3 days ago.  Persistent shoulder pain. EXAM: RIGHT SHOULDER - 2+ VIEW COMPARISON:  None. FINDINGS: Anterior dislocation of the humeral head. Severe remote trauma involving the humeral head, neck and proximal shaft with healed fractures. The right lung is grossly clear. Surgical changes involving the right breast are noted. IMPRESSION: Anterior subcoracoid  dislocation of the humeral head. Severe remote trauma. Electronically Signed   By: Marijo Sanes M.D.   On: 12/25/2016 19:16   Ct Head Wo Contrast  Result Date: 12/25/2016 CLINICAL DATA:  Pain after fall. EXAM: CT HEAD WITHOUT CONTRAST CT CERVICAL SPINE WITHOUT CONTRAST TECHNIQUE: Multidetector CT imaging of the head and cervical spine was performed following the standard protocol without intravenous contrast. Multiplanar CT image reconstructions of the cervical spine were also generated. COMPARISON:  None. FINDINGS: CT HEAD FINDINGS Brain: Right anterior parietal lobe encephalomalacia with associated ex vacuo dilatation of the anterior horn of the right lateral ventricle. Superficial and central atrophy is noted. No acute intracranial hemorrhage, midline shift or edema. No large vascular territory infarct. No effacement of the basal cisterns or fourth ventricle. Vascular: Atherosclerotic calcifications of both vertebral arteries and carotid siphons. No hyperdense vessels. Skull: Right anterior parietal burr hole. No skull fracture or suspicious osseous lesions. Sinuses/Orbits: Right ethmoid sinus mucosal thickening noted posteriorly. No acute sinusitis. Clear mastoids. Other: Non CT CERVICAL SPINE FINDINGS Alignment: Normal cervical lordosis. Intact atlantodental interval with osteoarthritic joint space narrowing. Intact craniocervical relationship. Skull base and vertebrae: No acute fracture. No primary bone lesion or focal pathologic process. Soft tissues and spinal canal: No prevertebral fluid or swelling. No visible canal hematoma. Disc levels: No focal disc herniation or central canal stenosis. No jumped or perched facets. No significant neural foraminal encroachment. Mild multilevel degenerative joint space narrowing and spurring about the facets. Upper chest: Negative. Other: None IMPRESSION: 1. Right anterior parietal  encephalomalacia with ex vacuo dilatation of the anterior horn of the right lateral  ventricle. This is likely a result of the reported remote traumatic brain injury the patient's history. There is atrophy with chronic small vessel ischemia. No acute intracranial abnormality. 2. No acute cervical spine fracture or posttraumatic subluxation. Mild multilevel facet arthropathy. Electronically Signed   By: Ashley Royalty M.D.   On: 12/25/2016 21:10   Ct Cervical Spine Wo Contrast  Result Date: 12/25/2016 CLINICAL DATA:  Pain after fall. EXAM: CT HEAD WITHOUT CONTRAST CT CERVICAL SPINE WITHOUT CONTRAST TECHNIQUE: Multidetector CT imaging of the head and cervical spine was performed following the standard protocol without intravenous contrast. Multiplanar CT image reconstructions of the cervical spine were also generated. COMPARISON:  None. FINDINGS: CT HEAD FINDINGS Brain: Right anterior parietal lobe encephalomalacia with associated ex vacuo dilatation of the anterior horn of the right lateral ventricle. Superficial and central atrophy is noted. No acute intracranial hemorrhage, midline shift or edema. No large vascular territory infarct. No effacement of the basal cisterns or fourth ventricle. Vascular: Atherosclerotic calcifications of both vertebral arteries and carotid siphons. No hyperdense vessels. Skull: Right anterior parietal burr hole. No skull fracture or suspicious osseous lesions. Sinuses/Orbits: Right ethmoid sinus mucosal thickening noted posteriorly. No acute sinusitis. Clear mastoids. Other: Non CT CERVICAL SPINE FINDINGS Alignment: Normal cervical lordosis. Intact atlantodental interval with osteoarthritic joint space narrowing. Intact craniocervical relationship. Skull base and vertebrae: No acute fracture. No primary bone lesion or focal pathologic process. Soft tissues and spinal canal: No prevertebral fluid or swelling. No visible canal hematoma. Disc levels: No focal disc herniation or central canal stenosis. No jumped or perched facets. No significant neural foraminal  encroachment. Mild multilevel degenerative joint space narrowing and spurring about the facets. Upper chest: Negative. Other: None IMPRESSION: 1. Right anterior parietal encephalomalacia with ex vacuo dilatation of the anterior horn of the right lateral ventricle. This is likely a result of the reported remote traumatic brain injury the patient's history. There is atrophy with chronic small vessel ischemia. No acute intracranial abnormality. 2. No acute cervical spine fracture or posttraumatic subluxation. Mild multilevel facet arthropathy. Electronically Signed   By: Ashley Royalty M.D.   On: 12/25/2016 21:10   Dg Hip Unilat W Or Wo Pelvis 2-3 Views Right  Result Date: 12/25/2016 CLINICAL DATA:  Fall 3 days ago. Right hip pain. Initial encounter. EXAM: DG HIP (WITH OR WITHOUT PELVIS) 2-3V RIGHT COMPARISON:  None. FINDINGS: Acute intertrochanteric right femur fracture with varus angulation. Both hips are located. Prominent osteopenia. Moderate stool distending the rectum. IMPRESSION: 1. Intertrochanteric right femur fracture with varus angulation. 2. Marked osteopenia. 3. Prominent rectal stool volume. Electronically Signed   By: Monte Fantasia M.D.   On: 12/25/2016 19:16     Medical Consultants:    None.  Anti-Infectives:   None  Subjective:    Autumn Bryant she relates her pain is controlled.  Objective:    Vitals:   12/26/16 0641 12/26/16 2245 12/27/16 0548 12/27/16 0841  BP: 139/88 (!) 117/52 (!) 112/93 (!) 123/56  Pulse: 98 (!) 113 62 (!) 103  Resp: 16 16 16 17   Temp: 98.6 F (37 C) 100 F (37.8 C) 99.4 F (37.4 C) 98 F (36.7 C)  TempSrc: Oral Oral Oral Oral  SpO2: 95% 95% 95% 93%  Weight:      Height:        Intake/Output Summary (Last 24 hours) at 12/27/2016 0857 Last data filed at 12/27/2016 0841 Gross per  24 hour  Intake 2000 ml  Output 605 ml  Net 1395 ml   Filed Weights   12/25/16 2002  Weight: 80.3 kg (177 lb)    Exam: General exam: In no acute  distress. Respiratory system: Good air movement clear to auscultation. Cardiovascular system: S1 & S2 heard, RRR. No JVD Gastrointestinal system: Abdomen is nondistended, soft and nontender.  Central nervous system: Alert and oriented. No focal neurological deficits. Extremities: No pedal edema, right leg externally rotated and shorter than the left. Skin: No rashes, lesions or ulcers   Data Reviewed:    Labs: Basic Metabolic Panel: Recent Labs  Lab 12/25/16 1800 12/26/16 0557  NA 137 142  K 3.6 3.8  CL 103 106  CO2 29 30  GLUCOSE 129* 119*  BUN 14 14  CREATININE 0.52 0.56  CALCIUM 8.5* 8.5*   GFR Estimated Creatinine Clearance: 59.7 mL/min (by C-G formula based on SCr of 0.56 mg/dL). Liver Function Tests: Recent Labs  Lab 12/25/16 1800 12/26/16 0557  AST 21 16  ALT 15 12*  ALKPHOS 69 63  BILITOT 0.7 0.6  PROT 6.0* 5.7*  ALBUMIN 3.2* 2.9*   No results for input(s): LIPASE, AMYLASE in the last 168 hours. No results for input(s): AMMONIA in the last 168 hours. Coagulation profile No results for input(s): INR, PROTIME in the last 168 hours.  CBC: Recent Labs  Lab 12/25/16 1800 12/26/16 0557  WBC 10.6* 10.8*  NEUTROABS 8.7*  --   HGB 11.5* 10.1*  HCT 35.4* 31.4*  MCV 89.6 91.5  PLT 172 176   Cardiac Enzymes: No results for input(s): CKTOTAL, CKMB, CKMBINDEX, TROPONINI in the last 168 hours. BNP (last 3 results) No results for input(s): PROBNP in the last 8760 hours. CBG: No results for input(s): GLUCAP in the last 168 hours. D-Dimer: No results for input(s): DDIMER in the last 72 hours. Hgb A1c: No results for input(s): HGBA1C in the last 72 hours. Lipid Profile: No results for input(s): CHOL, HDL, LDLCALC, TRIG, CHOLHDL, LDLDIRECT in the last 72 hours. Thyroid function studies: No results for input(s): TSH, T4TOTAL, T3FREE, THYROIDAB in the last 72 hours.  Invalid input(s): FREET3 Anemia work up: No results for input(s): VITAMINB12, FOLATE,  FERRITIN, TIBC, IRON, RETICCTPCT in the last 72 hours. Sepsis Labs: Recent Labs  Lab 12/25/16 1800 12/26/16 0557  WBC 10.6* 10.8*   Microbiology Recent Results (from the past 240 hour(s))  Urine culture     Status: Abnormal (Preliminary result)   Collection Time: 12/25/16  7:38 PM  Result Value Ref Range Status   Specimen Description URINE, CATHETERIZED  Final   Special Requests NONE  Final   Culture >=100,000 COLONIES/mL GRAM NEGATIVE RODS (A)  Final   Report Status PENDING  Incomplete  MRSA PCR Screening     Status: None   Collection Time: 12/26/16  1:55 AM  Result Value Ref Range Status   MRSA by PCR NEGATIVE NEGATIVE Final    Comment:        The GeneXpert MRSA Assay (FDA approved for NASAL specimens only), is one component of a comprehensive MRSA colonization surveillance program. It is not intended to diagnose MRSA infection nor to guide or monitor treatment for MRSA infections.      Medications:   . escitalopram  5 mg Oral Daily   Continuous Infusions: . cefTRIAXone (ROCEPHIN)  IV Stopped (12/26/16 1800)  . dextrose 5 % and 0.45% NaCl 75 mL/hr at 12/26/16 1146      LOS: 2 days  Charlynne Cousins  [facility] Pager [phone]  *Please refer to [email or web address], password [email or web address] to get updated schedule on who will round on this patient, as hospitalists switch teams weekly. If 7PM-7AM, please contact night-coverage at [email or web address], password [email or web address] for any overnight needs.  12/27/2016, 8:57 AM

## 2016-12-27 NOTE — Anesthesia Postprocedure Evaluation (Signed)
Anesthesia Post Note  Patient: Autumn Bryant  Procedure(s) Performed: INTRAMEDULLARY (IM) NAIL INTERTROCHANTRIC RIGHT HIP (Right )     Patient location during evaluation: PACU Anesthesia Type: General Level of consciousness: sedated Pain management: pain level controlled Vital Signs Assessment: post-procedure vital signs reviewed and stable Respiratory status: spontaneous breathing and respiratory function stable Cardiovascular status: stable Postop Assessment: no apparent nausea or vomiting Anesthetic complications: no    Last Vitals:  Vitals:   12/27/16 1245 12/27/16 1300  BP: 127/67 127/77  Pulse: (!) 103 (!) 107  Resp: 16 (!) 23  Temp:  36.9 C  SpO2: 95% 96%    Last Pain:  Vitals:   12/27/16 0841  TempSrc: Oral  PainSc:                  [name]

## 2016-12-27 NOTE — H&P (View-Only) (Signed)
     Subjective:   Procedure(s) (LRB): INTRAMEDULLARY (IM) NAIL INTERTROCHANTRIC RIGHT HIP (Right)   Patient reports pain as moderate and increases with movement.  No events throughout the night.  Patient has a hx of TBI from previous fall. Will need to get consent from son with regards to proceeding with surgery.  Objective:   VITALS:   Vitals:   12/26/16 2245 12/27/16 0548  BP: (!) 117/52 (!) 112/93  Pulse: (!) 113 62  Resp: 16 16  Temp: 100 F (37.8 C) 99.4 F (37.4 C)  SpO2: 95% 95%    Dorsiflexion/Plantar flexion intact No cellulitis present Compartment soft  LABS Recent Labs    12/25/16 1800 12/26/16 0557  HGB 11.5* 10.1*  HCT 35.4* 31.4*  WBC 10.6* 10.8*  PLT 172 176    Recent Labs    12/25/16 1800 12/26/16 0557  NA 137 142  K 3.6 3.8  BUN 14 14  CREATININE 0.52 0.56  GLUCOSE 129* 119*     Assessment/Plan:   Procedure(s) (LRB): INTRAMEDULLARY (IM) NAIL INTERTROCHANTRIC RIGHT HIP (Right)   NPO now Plan on obtaining consent for a right hip IM nail, per Dr. Alvan Dame Consent will have to be obtained from the son     West Pugh. Teriana Danker   PAC  12/27/2016, 8:27 AM

## 2016-12-27 NOTE — Brief Op Note (Signed)
12/25/2016 - 12/27/2016  10:10 AM  PATIENT:  [name]  81 y.o. female  PRE-OPERATIVE DIAGNOSIS:  Right Intertrochanteric femur Fracture  POST-OPERATIVE DIAGNOSIS:  Right Intertrochanteric femur Fracture  PROCEDURE:  Procedure(s): INTRAMEDULLARY (IM) NAIL INTERTROCHANTRIC RIGHT HIP (Right)  SURGEON:  Surgeon(s) and Role:    Paralee Cancel, MD - Primary  PHYSICIAN ASSISTANT: None  ASSISTANTS: surgical team   ANESTHESIA:   general  EBL:  <200 cc   BLOOD ADMINISTERED:none  DRAINS: none   LOCAL MEDICATIONS USED:  NONE  SPECIMEN:  No Specimen  DISPOSITION OF SPECIMEN:  N/A  COUNTS:  YES  TOURNIQUET:  * No tourniquets in log *  DICTATION: .Other Dictation: Dictation Number [id number]  PLAN OF CARE: Admit to inpatient   PATIENT DISPOSITION:  PACU - hemodynamically stable.   Delay start of Pharmacological VTE agent (>24hrs) due to surgical blood loss or risk of bleeding: no

## 2016-12-27 NOTE — Progress Notes (Signed)
     Subjective:   Procedure(s) (LRB): INTRAMEDULLARY (IM) NAIL INTERTROCHANTRIC RIGHT HIP (Right)   Patient reports pain as moderate and increases with movement.  No events throughout the night.  Patient has a hx of TBI from previous fall. Will need to get consent from son with regards to proceeding with surgery.  Objective:   VITALS:   Vitals:   12/26/16 2245 12/27/16 0548  BP: (!) 117/52 (!) 112/93  Pulse: (!) 113 62  Resp: 16 16  Temp: 100 F (37.8 C) 99.4 F (37.4 C)  SpO2: 95% 95%    Dorsiflexion/Plantar flexion intact No cellulitis present Compartment soft  LABS Recent Labs    12/25/16 1800 12/26/16 0557  HGB 11.5* 10.1*  HCT 35.4* 31.4*  WBC 10.6* 10.8*  PLT 172 176    Recent Labs    12/25/16 1800 12/26/16 0557  NA 137 142  K 3.6 3.8  BUN 14 14  CREATININE 0.52 0.56  GLUCOSE 129* 119*     Assessment/Plan:   Procedure(s) (LRB): INTRAMEDULLARY (IM) NAIL INTERTROCHANTRIC RIGHT HIP (Right)   NPO now Plan on obtaining consent for a right hip IM nail, per Dr. Alvan Dame Consent will have to be obtained from the son     West Pugh. Laron Boorman   PAC  12/27/2016, 8:27 AM

## 2016-12-27 NOTE — Interval H&P Note (Signed)
History and Physical Interval Note:  12/27/2016 10:00 AM  Autumn Bryant  has presented today for surgery, with the diagnosis of Right Intertroch Fracture  The various methods of treatment have been discussed with the patient and family. After consideration of risks, benefits and other options for treatment, the patient has consented to  Procedure(s): INTRAMEDULLARY (IM) NAIL INTERTROCHANTRIC RIGHT HIP (Right) as a surgical intervention .  The patient's history has been reviewed, patient examined, no change in status, stable for surgery.  I have reviewed the patient's chart and labs.  Questions were answered to the patient's satisfaction.     Mauri Pole

## 2016-12-27 NOTE — Clinical Social Work Note (Signed)
Clinical Social Work Assessment  Patient Details  Name: Autumn Bryant MRN: [id number] Date of Birth: 30-Mar-1935  Date of referral:  12/27/16               Reason for consult:  Facility Placement                Permission sought to share information with:  Family Supports, Chartered certified accountant granted to share information::  Yes, Verbal Permission Granted  Name::        Agency::  [facility]  Relationship::  Son  Sport and exercise psychologist Information:     Housing/Transportation Living arrangements for the past 2 months:  [place] of Information:  Adult Children, Power of Forensic psychologist, Other (Comment Required)(Facility ) Patient Interpreter Needed:  None Criminal Activity/Legal Involvement Pertinent to Current Situation/Hospitalization:  No - Comment as needed Significant Relationships:  Adult Children Lives with:  Facility Resident Do you feel safe going back to the place where you live?  Yes Need for family participation in patient care:  Yes (LTC, SNF placement)  Care giving concerns:  Patient admitted for hip fracture and surgical intervention.  Patient son is concern the patient will form an infection since she does not walk.  Social Worker assessment / plan:  Patient alert self. CSW spoke with the patient son by phone. He reports the patient is a LTC resident. She was admitted after falling and fracturing her hip. He states the patient does not walk and has not walked in years due to needed knee replacements. He reports the staff at the facility pick her up and place her in a wheelchair. He is unsure how the patient fractured her hip. FL2 started. PASSRR Complete.   CSW confirmed with the facility they will accept patient at discharge.   Plan:Assist discharge back to [facility]   Employment status:   Retired Forensic scientist:  Medicare PT Recommendations:  [place] / Referral to community resources:   [place]  Patient/Family's Response to care: Agreeable and Responding well to care.  Patient/Family's Understanding of and Emotional Response to Diagnosis, Current Treatment, and Prognosis: Patient son is POA, he understands patient level of care and current diagnosis. He states the patient will return to SNF at discharge.   Emotional Assessment Appearance:  Appears stated age Attitude/Demeanor/Rapport:  Unable to Assess Affect (typically observed):  Calm Orientation:  Oriented to Self Alcohol / Substance use:  Not Applicable Psych involvement (Current and /or in the community):  No (Comment)  Discharge Needs  Concerns to be addressed:  Discharge Planning Concerns Readmission within the last 30 days:  No Current discharge risk:  Dependent with Mobility Barriers to Discharge:  No Barriers Identified, Continued Medical Work up   [employer], LCSW 12/27/2016, 4:15 PM

## 2016-12-27 NOTE — Anesthesia Procedure Notes (Signed)
Procedure Name: Intubation Date/Time: 12/27/2016 10:18 AM Performed by: Sharlette Dense, CRNA Patient Re-evaluated:Patient Re-evaluated prior to induction Oxygen Delivery Method: Circle system utilized Preoxygenation: Pre-oxygenation with 100% oxygen Induction Type: IV induction Ventilation: Mask ventilation without difficulty and Oral airway inserted - appropriate to patient size Laryngoscope Size: Sabra Heck and 2 Grade View: Grade I Tube type: Oral Tube size: 7.0 mm Number of attempts: 1 Placement Confirmation: ETT inserted through vocal cords under direct vision,  positive ETCO2 and breath sounds checked- equal and bilateral Secured at: 21 cm Tube secured with: Tape Dental Injury: Teeth and Oropharynx as per pre-operative assessment

## 2016-12-28 DIAGNOSIS — D62 Acute posthemorrhagic anemia: Secondary | ICD-10-CM

## 2016-12-28 LAB — HEMOGLOBIN AND HEMATOCRIT, BLOOD
HCT: 25 % — ABNORMAL LOW (ref 36.0–46.0)
HEMOGLOBIN: 8.4 g/dL — AB (ref 12.0–15.0)

## 2016-12-28 LAB — CBC
HCT: 18.9 % — ABNORMAL LOW (ref 36.0–46.0)
HEMOGLOBIN: 6.4 g/dL — AB (ref 12.0–15.0)
MCH: 29.9 pg (ref 26.0–34.0)
MCHC: 33.9 g/dL (ref 30.0–36.0)
MCV: 88.3 fL (ref 78.0–100.0)
PLATELETS: 137 10*3/uL — AB (ref 150–400)
RBC: 2.14 MIL/uL — AB (ref 3.87–5.11)
RDW: 14.7 % (ref 11.5–15.5)
WBC: 6.6 10*3/uL (ref 4.0–10.5)

## 2016-12-28 LAB — BASIC METABOLIC PANEL
ANION GAP: 7 (ref 5–15)
BUN: 15 mg/dL (ref 6–20)
CHLORIDE: 103 mmol/L (ref 101–111)
CO2: 28 mmol/L (ref 22–32)
Calcium: 8.1 mg/dL — ABNORMAL LOW (ref 8.9–10.3)
Creatinine, Ser: 0.58 mg/dL (ref 0.44–1.00)
Glucose, Bld: 140 mg/dL — ABNORMAL HIGH (ref 65–99)
POTASSIUM: 4.2 mmol/L (ref 3.5–5.1)
SODIUM: 138 mmol/L (ref 135–145)

## 2016-12-28 LAB — URINE CULTURE: Culture: >=100000 — AB

## 2016-12-28 LAB — PREPARE RBC (CROSSMATCH)

## 2016-12-28 MED ORDER — FUROSEMIDE 20 MG PO TABS
20.0000 mg | ORAL_TABLET | Freq: Every day | ORAL | Status: DC
  Administered 2016-12-29: 10:00:00 20 mg via ORAL
  Filled 2016-12-28: qty 1

## 2016-12-28 MED ORDER — FUROSEMIDE 10 MG/ML IJ SOLN
20.0000 mg | Freq: Once | INTRAMUSCULAR | Status: AC
  Administered 2016-12-28: 15:00:00 20 mg via INTRAVENOUS
  Filled 2016-12-28: qty 2

## 2016-12-28 MED ORDER — NITROFURANTOIN MONOHYD MACRO 100 MG PO CAPS
100.0000 mg | ORAL_CAPSULE | Freq: Two times a day (BID) | ORAL | Status: DC
  Administered 2016-12-28 – 2016-12-29 (×3): 100 mg via ORAL
  Filled 2016-12-28 (×3): qty 1

## 2016-12-28 MED ORDER — SODIUM CHLORIDE 0.9 % IV SOLN
Freq: Once | INTRAVENOUS | Status: AC
  Administered 2016-12-28: 10:00:00 via INTRAVENOUS

## 2016-12-28 NOTE — Progress Notes (Signed)
Subjective: 1 Day Post-Op Procedure(s) (LRB): INTRAMEDULLARY (IM) NAIL INTERTROCHANTRIC RIGHT HIP (Right) Patient reports pain as mild to right leg.  Lying in bed comfortably. Tolerating PO's. Reports dizziness. NO CP or SOB. NO F/C.  Objective: Vital signs in last 24 hours: Temp:  [97.5 F (36.4 C)-98.9 F (37.2 C)] 97.5 F (36.4 C) (11/24 9826) Pulse Rate:  [82-107] 82 (11/24 0614) Resp:  [10-23] 14 (11/24 0614) BP: (108-143)/(54-98) 110/64 (11/24 0614) SpO2:  [93 %-100 %] 100 % (11/24 0614)  Intake/Output from previous day: 11/23 0701 - 11/24 0700 In: 2991.7 [P.O.:435; I.V.:2101.7; IV Piggyback:455] Out: 840 [Urine:690; Blood:150] Intake/Output this shift: No intake/output data recorded.  Recent Labs    12/25/16 1800 12/26/16 0557 12/28/16 0558  HGB 11.5* 10.1* 6.4*   Recent Labs    12/26/16 0557 12/28/16 0558  WBC 10.8* 6.6  RBC 3.43* 2.14*  HCT 31.4* 18.9*  PLT 176 137*   Recent Labs    12/26/16 0557 12/28/16 0558  NA 142 138  K 3.8 4.2  CL 106 103  CO2 30 28  BUN 14 15  CREATININE 0.56 0.58  GLUCOSE 119* 140*  CALCIUM 8.5* 8.1*   No results for input(s): LABPT, INR in the last 72 hours.  Well nourished. Alert and oriented x3. RRR, Lungs clear, BS x4. Abdomen soft and non tender. Right Calf soft and non tender. Right leg dressing C/D/I. No DVT signs. Compartment soft. No signs of infection.  Right LE grossly neurovascular intact. Assessment/Plan: 1 Day Post-Op Procedure(s) (LRB): INTRAMEDULLARY (IM) NAIL INTERTROCHANTRIC RIGHT HIP (Right) PT/OT Pain management SCDs  Post-op anemia: Transfuse 2 units of PRBCs monitor labs  Emelee Rodocker L 12/28/2016, 8:22 AM

## 2016-12-28 NOTE — Care Management Note (Addendum)
Case Management Note  Patient Details  Name: Autumn Bryant MRN: [id number] Date of Birth: 09-07-1935  Subjective/Objective:     ORIF of right hip femur fracture               Action/Plan: Discharge Planning: Pt is from [facility]. CSW following for SNF placement back to [facility].   PCP Inocencio Homes MD  Expected Discharge Date:               Expected Discharge Plan:  [place]  In-House Referral:  Clinical Social Work  Discharge planning Services  CM Consult  Post Acute Care Choice:  NA Choice offered to:  NA  DME Arranged:  N/A DME Agency:  NA  HH Arranged:  NA HH Agency:  NA  Status of Service:  Completed, signed off  If discussed at [place] of Stay Meetings, dates discussed:    Additional Comments:  Erenest Rasher, RN 12/28/2016, 12:28 PM

## 2016-12-28 NOTE — Progress Notes (Signed)
PT Cancellation Note  Patient Details Name: Autumn Bryant MRN: [id number] DOB: 14-Feb-1935   Cancelled Treatment:     PT order received but eval deferred  2* pt with Hgb of 6.4.  Will follow.   Ernestine Rohman 12/28/2016, 7:50 AM

## 2016-12-28 NOTE — Progress Notes (Signed)
[facility] PROGRESS NOTE    Progress Note  Autumn Bryant  KTG:[id number] DOB: 20-Dec-1935 DOA: 12/25/2016 PCP: [name], MD     Brief Narrative:   Autumn Bryant Autumn Bryant is an 81 y.o. female past medical history significant for  intraparenchymal hematoma in February 2018 at [facility], hypertension and obesity comes in after right hip pain after fall the day prior to admission an x-ray showed right hip fracture.  Assessment/Plan:   Closed right hip fracture [facility]): Orthopedic surgery was consulted Dr. Wynelle Link status post surgical intervention on 12/27/2016. Physical therapy evaluation is pending.  New Acute blood loss anemia: Hemoglobin came down from 10.1-6.4 she is being transfused a unit of packed red blood cells we will check a CBC post transfusional likely due to surgery.  Acute encephalopathy with a history of traumatic brain injury with recent intraparenchymal hematoma: CT scan of the head showed no acute findings. Now resolved.  Urinary tract infection: Urine culture showed E. coli sensitive to Macrobid. Discontinue Rocephin. start Macrobid.  Intermittent anasarca: Patient was unlike 6 at facility 2-D echo showed an EF of 60% with grade 1 diastolic heart failure.  Will need to be on low-dose Lasix as an outpatient.  Obesity (BMI 30.0-34.9)   DVT prophylaxis: lovenox Family Communication:son Disposition Plan/Barrier to D/C: SNF in 2 days Code Status:     Code Status Orders  (From admission, onward)        Start     Ordered   12/25/16 2255  Full code  Continuous     12/25/16 2254    Code Status History    Date Active Date Inactive Code Status Order ID Comments User Context   This patient has a current code status but no historical code status.        IV Access:    Peripheral IV   Procedures and diagnostic studies:   Dg C-arm 1-60 Min-no Report  Result Date: 12/27/2016 Fluoroscopy was utilized by the requesting  physician.  No radiographic interpretation.     Medical Consultants:    None.  Anti-Infectives:   None  Subjective:    Autumn Bryant she relates her pain is controlled.  Objective:    Vitals:   12/27/16 2144 12/28/16 0208 12/28/16 0614 12/28/16 1100  BP: 132/60 121/60 110/64 129/60  Pulse: (!) 101 84 82 (!) 102  Resp: 17 14 14 16   Temp: 98.5 F (36.9 C) 98 F (36.7 C) (!) 97.5 F (36.4 C) 98.1 F (36.7 C)  TempSrc: Oral Oral Oral Oral  SpO2: 100% 100% 100% 100%  Weight:      Height:        Intake/Output Summary (Last 24 hours) at 12/28/2016 1124 Last data filed at 12/28/2016 4287 Gross per 24 hour  Intake 2991.67 ml  Output 615 ml  Net 2376.67 ml   Filed Weights   12/25/16 2002  Weight: 80.3 kg (177 lb)    Exam: General exam: In no acute distress. Respiratory system: Good air movement clear to auscultation. Cardiovascular system: S1 & S2 heard, RRR. No JVD Gastrointestinal system: Abdomen is nondistended, soft and nontender.  Central nervous system: Alert and oriented. No focal neurological deficits. Extremities: No pedal edema. Skin: No rashes, lesions or ulcers   Data Reviewed:    Labs: Basic Metabolic Panel: Recent Labs  Lab 12/25/16 1800 12/26/16 0557 12/28/16 0558  NA 137 142 138  K 3.6 3.8 4.2  CL 103 106 103  CO2 29 30 28  GLUCOSE 129* 119* 140*  BUN 14 14 15   CREATININE 0.52 0.56 0.58  CALCIUM 8.5* 8.5* 8.1*   GFR Estimated Creatinine Clearance: 59.7 mL/min (by C-G formula based on SCr of 0.58 mg/dL). Liver Function Tests: Recent Labs  Lab 12/25/16 1800 12/26/16 0557  AST 21 16  ALT 15 12*  ALKPHOS 69 63  BILITOT 0.7 0.6  PROT 6.0* 5.7*  ALBUMIN 3.2* 2.9*   No results for input(s): LIPASE, AMYLASE in the last 168 hours. No results for input(s): AMMONIA in the last 168 hours. Coagulation profile No results for input(s): INR, PROTIME in the last 168 hours.  CBC: Recent Labs  Lab 12/25/16 1800 12/26/16 0557  12/28/16 0558  WBC 10.6* 10.8* 6.6  NEUTROABS 8.7*  --   --   HGB 11.5* 10.1* 6.4*  HCT 35.4* 31.4* 18.9*  MCV 89.6 91.5 88.3  PLT 172 176 137*   Cardiac Enzymes: No results for input(s): CKTOTAL, CKMB, CKMBINDEX, TROPONINI in the last 168 hours. BNP (last 3 results) No results for input(s): PROBNP in the last 8760 hours. CBG: No results for input(s): GLUCAP in the last 168 hours. D-Dimer: No results for input(s): DDIMER in the last 72 hours. Hgb A1c: No results for input(s): HGBA1C in the last 72 hours. Lipid Profile: No results for input(s): CHOL, HDL, LDLCALC, TRIG, CHOLHDL, LDLDIRECT in the last 72 hours. Thyroid function studies: No results for input(s): TSH, T4TOTAL, T3FREE, THYROIDAB in the last 72 hours.  Invalid input(s): FREET3 Anemia work up: No results for input(s): VITAMINB12, FOLATE, FERRITIN, TIBC, IRON, RETICCTPCT in the last 72 hours. Sepsis Labs: Recent Labs  Lab 12/25/16 1800 12/26/16 0557 12/28/16 0558  WBC 10.6* 10.8* 6.6   Microbiology Recent Results (from the past 240 hour(s))  Urine culture     Status: Abnormal   Collection Time: 12/25/16  7:38 PM  Result Value Ref Range Status   Specimen Description URINE, CATHETERIZED  Final   Special Requests NONE  Final   Culture (A)  Final    >=100,000 COLONIES/mL ESCHERICHIA COLI Confirmed Extended Spectrum Beta-Lactamase Producer (ESBL).  In bloodstream infections from ESBL organisms, carbapenems are preferred over piperacillin/tazobactam. They are shown to have a lower risk of mortality. Performed at [facility], [place] [address], [place], [place] [address]    Report Status 12/28/2016 FINAL  Final   Organism ID, Bacteria ESCHERICHIA COLI (A)  Final      Susceptibility   Escherichia coli - MIC*    AMPICILLIN >=32 RESISTANT Resistant     CEFAZOLIN >=64 RESISTANT Resistant     CEFTRIAXONE >=64 RESISTANT Resistant     CIPROFLOXACIN >=4 RESISTANT Resistant     GENTAMICIN <=1 SENSITIVE Sensitive       IMIPENEM <=0.25 SENSITIVE Sensitive     NITROFURANTOIN <=16 SENSITIVE Sensitive     TRIMETH/SULFA >=320 RESISTANT Resistant     AMPICILLIN/SULBACTAM >=32 RESISTANT Resistant     PIP/TAZO 8 SENSITIVE Sensitive     Extended ESBL POSITIVE Resistant     * >=100,000 COLONIES/mL ESCHERICHIA COLI  Blood culture (routine x 2)     Status: None (Preliminary result)   Collection Time: 12/25/16  8:30 PM  Result Value Ref Range Status   Specimen Description BLOOD RIGHT HAND  Final   Special Requests   Final    BOTTLES DRAWN AEROBIC AND ANAEROBIC Blood Culture adequate volume   Culture   Final    NO GROWTH 1 DAY Performed at [facility], [place]  [address] [place], [place] [address]    Report Status PENDING  Incomplete  Blood culture (routine x 2)     Status: None (Preliminary result)   Collection Time: 12/25/16  9:43 PM  Result Value Ref Range Status   Specimen Description BLOOD BLOOD LEFT FOREARM  Final   Special Requests IN PEDIATRIC BOTTLE Blood Culture adequate volume  Final   Culture   Final    NO GROWTH 1 DAY Performed at [facility], [place] [address] [place],  [address]    Report Status PENDING  Incomplete  MRSA PCR Screening     Status: None   Collection Time: 12/26/16  1:55 AM  Result Value Ref Range Status   MRSA by PCR NEGATIVE NEGATIVE Final    Comment:        The GeneXpert MRSA Assay (FDA approved for NASAL specimens only), is one component of a comprehensive MRSA colonization surveillance program. It is not intended to diagnose MRSA infection nor to guide or monitor treatment for MRSA infections.      Medications:   . aspirin EC  325 mg Oral Q breakfast  . docusate sodium  100 mg Oral BID  . escitalopram  5 mg Oral Daily  . ferrous sulfate  325 mg Oral TID PC  . furosemide  20 mg Intravenous Once  . gabapentin  200 mg Oral TID   Continuous Infusions: . sodium chloride 50 mL/hr at 12/27/16 1728  . sodium chloride    . cefTRIAXone  (ROCEPHIN)  IV Stopped (12/27/16 1800)  . methocarbamol (ROBAXIN)  IV Stopped (12/27/16 1308)      LOS: 3 days   [facility] Pager [phone]  *Please refer to [email or web address], password [email or web address] to get updated schedule on who will round on this patient, as hospitalists switch teams weekly. If 7PM-7AM, please contact night-coverage at [email or web address], password [email or web address] for any overnight needs.  12/28/2016, 11:24 AM

## 2016-12-28 NOTE — Progress Notes (Signed)
OT Cancellation Note  Patient Details Name: Autumn Bryant MRN: [id number] DOB: [birth year]   Cancelled Treatment:    Reason Eval/Treat Not Completed: OT screened, no needs identified, will sign off.  Pt is a LTC resident. Will defer OT evaluation to SNF.  [name] 12/28/2016, 7:42 AM  Lesle Chris, OTR/L [phone] 12/28/2016

## 2016-12-29 ENCOUNTER — Encounter: Payer: Self-pay | Admitting: Internal Medicine

## 2016-12-29 DIAGNOSIS — R488 Other symbolic dysfunctions: Secondary | ICD-10-CM | POA: Diagnosis not present

## 2016-12-29 DIAGNOSIS — M109 Gout, unspecified: Secondary | ICD-10-CM | POA: Diagnosis not present

## 2016-12-29 DIAGNOSIS — M6281 Muscle weakness (generalized): Secondary | ICD-10-CM | POA: Diagnosis not present

## 2016-12-29 DIAGNOSIS — R4182 Altered mental status, unspecified: Secondary | ICD-10-CM | POA: Diagnosis not present

## 2016-12-29 DIAGNOSIS — M810 Age-related osteoporosis without current pathological fracture: Secondary | ICD-10-CM | POA: Diagnosis not present

## 2016-12-29 DIAGNOSIS — M8588 Other specified disorders of bone density and structure, other site: Secondary | ICD-10-CM | POA: Diagnosis not present

## 2016-12-29 DIAGNOSIS — G8911 Acute pain due to trauma: Secondary | ICD-10-CM | POA: Diagnosis not present

## 2016-12-29 DIAGNOSIS — F339 Major depressive disorder, recurrent, unspecified: Secondary | ICD-10-CM | POA: Diagnosis not present

## 2016-12-29 DIAGNOSIS — Z96641 Presence of right artificial hip joint: Secondary | ICD-10-CM | POA: Diagnosis not present

## 2016-12-29 DIAGNOSIS — I10 Essential (primary) hypertension: Secondary | ICD-10-CM | POA: Diagnosis not present

## 2016-12-29 DIAGNOSIS — D62 Acute posthemorrhagic anemia: Secondary | ICD-10-CM | POA: Diagnosis not present

## 2016-12-29 DIAGNOSIS — N390 Urinary tract infection, site not specified: Secondary | ICD-10-CM | POA: Diagnosis not present

## 2016-12-29 DIAGNOSIS — M7989 Other specified soft tissue disorders: Secondary | ICD-10-CM | POA: Diagnosis not present

## 2016-12-29 DIAGNOSIS — I509 Heart failure, unspecified: Secondary | ICD-10-CM | POA: Diagnosis not present

## 2016-12-29 DIAGNOSIS — G629 Polyneuropathy, unspecified: Secondary | ICD-10-CM | POA: Diagnosis not present

## 2016-12-29 DIAGNOSIS — Z4789 Encounter for other orthopedic aftercare: Secondary | ICD-10-CM | POA: Diagnosis not present

## 2016-12-29 DIAGNOSIS — F329 Major depressive disorder, single episode, unspecified: Secondary | ICD-10-CM | POA: Diagnosis not present

## 2016-12-29 DIAGNOSIS — S72001D Fracture of unspecified part of neck of right femur, subsequent encounter for closed fracture with routine healing: Secondary | ICD-10-CM | POA: Diagnosis not present

## 2016-12-29 DIAGNOSIS — S72101D Unspecified trochanteric fracture of right femur, subsequent encounter for closed fracture with routine healing: Secondary | ICD-10-CM | POA: Diagnosis not present

## 2016-12-29 DIAGNOSIS — Z9181 History of falling: Secondary | ICD-10-CM | POA: Diagnosis not present

## 2016-12-29 DIAGNOSIS — S72009A Fracture of unspecified part of neck of unspecified femur, initial encounter for closed fracture: Secondary | ICD-10-CM | POA: Diagnosis not present

## 2016-12-29 DIAGNOSIS — S72001A Fracture of unspecified part of neck of right femur, initial encounter for closed fracture: Secondary | ICD-10-CM | POA: Diagnosis not present

## 2016-12-29 DIAGNOSIS — R601 Generalized edema: Secondary | ICD-10-CM | POA: Diagnosis not present

## 2016-12-29 DIAGNOSIS — B9620 Unspecified Escherichia coli [E. coli] as the cause of diseases classified elsewhere: Secondary | ICD-10-CM | POA: Diagnosis not present

## 2016-12-29 DIAGNOSIS — F419 Anxiety disorder, unspecified: Secondary | ICD-10-CM | POA: Diagnosis not present

## 2016-12-29 DIAGNOSIS — G9340 Encephalopathy, unspecified: Secondary | ICD-10-CM | POA: Diagnosis not present

## 2016-12-29 DIAGNOSIS — S72141D Displaced intertrochanteric fracture of right femur, subsequent encounter for closed fracture with routine healing: Secondary | ICD-10-CM | POA: Diagnosis not present

## 2016-12-29 DIAGNOSIS — S72141A Displaced intertrochanteric fracture of right femur, initial encounter for closed fracture: Secondary | ICD-10-CM | POA: Diagnosis not present

## 2016-12-29 LAB — CBC
HEMATOCRIT: 23.8 % — AB (ref 36.0–46.0)
HEMOGLOBIN: 8 g/dL — AB (ref 12.0–15.0)
MCH: 30.2 pg (ref 26.0–34.0)
MCHC: 33.6 g/dL (ref 30.0–36.0)
MCV: 89.8 fL (ref 78.0–100.0)
Platelets: 166 10*3/uL (ref 150–400)
RBC: 2.65 MIL/uL — AB (ref 3.87–5.11)
RDW: 14.9 % (ref 11.5–15.5)
WBC: 10.2 10*3/uL (ref 4.0–10.5)

## 2016-12-29 LAB — BASIC METABOLIC PANEL
ANION GAP: 8 (ref 5–15)
BUN: 21 mg/dL — ABNORMAL HIGH (ref 6–20)
CALCIUM: 8.3 mg/dL — AB (ref 8.9–10.3)
CO2: 28 mmol/L (ref 22–32)
Chloride: 106 mmol/L (ref 101–111)
Creatinine, Ser: 0.52 mg/dL (ref 0.44–1.00)
GFR calc non Af Amer: >60 mL/min (ref >60)
Glucose, Bld: 94 mg/dL (ref 65–99)
Potassium: 3.5 mmol/L (ref 3.5–5.1)
Sodium: 142 mmol/L (ref 135–145)

## 2016-12-29 MED ORDER — HYDROCODONE-ACETAMINOPHEN 5-325 MG PO TABS: 1.0000 | ORAL_TABLET | Freq: Four times a day (QID) | ORAL | 0 refills | Status: DC | PRN

## 2016-12-29 MED ORDER — ALPRAZOLAM 0.25 MG PO TABS: 0.2500 mg | ORAL_TABLET | Freq: Every evening | ORAL | 0 refills | Status: DC | PRN

## 2016-12-29 MED ORDER — NITROFURANTOIN MONOHYD MACRO 100 MG PO CAPS: 100.0000 mg | ORAL_CAPSULE | Freq: Two times a day (BID) | ORAL | 0 refills | Status: DC

## 2016-12-29 NOTE — Evaluation (Signed)
Physical Therapy Evaluation Patient Details Name: Autumn Bryant MRN: [id number] DOB: 04-Sep-1935 Today's Date: 12/29/2016   History of Present Illness  81 yo female admitted from SNF after sustaining a fall. S/P ORIF R femur 12/27/16. Hx of TBI, obesity. Pt is a LTC SNF resident    Clinical Impression  On eval, pt required Total assist +2 for mobility. She sat EOB for ~4-5 minutes with Mod assist for static sitting balance. Mobility is significantly limited by pain. Recommend return to SNF. Will follow during hospital stay.     Follow Up Recommendations SNF    Equipment Recommendations  None recommended by PT    Recommendations for Other Services       Precautions / Restrictions Precautions Precautions: Fall Restrictions Weight Bearing Restrictions: Yes RLE Weight Bearing: Touchdown weight bearing      Mobility  Bed Mobility Overal bed mobility: Needs Assistance Bed Mobility: Supine to Sit;Sit to Supine     Supine to sit: Total assist;+2 for physical assistance;+2 for safety/equipment;HOB elevated Sit to supine: +2 for physical assistance;+2 for safety/equipment;HOB elevated   General bed mobility comments: Assist for trunk and bil LEs. Utilized bedpad for scooting, positioning. Pt did attempt to assist with UEs and bedrail.   Transfers                 General transfer comment: NT  Ambulation/Gait                Stairs            Wheelchair Mobility    Modified Rankin (Stroke Patients Only)       Balance Overall balance assessment: Needs assistance Sitting-balance support: Bilateral upper extremity supported;Feet unsupported Sitting balance-Leahy Scale: Poor Sitting balance - Comments: Sat EOB for ~4-5 mintues with Mod assist for static sitting balance.                                      Pertinent Vitals/Pain Pain Assessment: Faces Faces Pain Scale: Hurts worst Pain Location: R LE with activity Pain Descriptors /  Indicators: Grimacing;Guarding;Moaning;Sharp Pain Intervention(s): Limited activity within patient's tolerance;Repositioned;Premedicated before session    [place] expects to be discharged to:: Skilled nursing facility                      Prior Function Level of [place]: Needs assistance   Gait / Transfers Assistance Needed: non ambulatory. Total assist for wheelchair transfers  ADL's / Homemaking Assistance Needed: Total assist        Hand Dominance        Extremity/Trunk Assessment   Upper Extremity Assessment Upper Extremity Assessment: Generalized weakness    Lower Extremity Assessment Lower Extremity Assessment: RLE deficits/detail;LLE deficits/detail RLE Deficits / Details: Pt able to move foot/ankle through small ROM.  RLE: Unable to fully assess due to pain LLE Deficits / Details: Limited by pain in R LE       Communication   Communication: No difficulties  Cognition Arousal/Alertness: Awake/alert Behavior During Therapy: WFL for tasks assessed/performed Overall Cognitive Status: History of cognitive impairments - at baseline                                        General Comments      Exercises     Assessment/Plan  PT Assessment Patient needs continued PT services  PT Problem List Decreased strength;Decreased balance;Decreased mobility;Decreased range of motion;Decreased activity tolerance;Obesity;Decreased cognition;Decreased knowledge of use of DME;Pain       PT Treatment Interventions Functional mobility training;Therapeutic exercise;Patient/family education;Therapeutic activities;DME instruction;Balance training    PT Goals (Current goals can be found in the Care Plan section)  Acute Rehab PT Goals Patient Stated Goal: less pain PT Goal Formulation: With patient Time For Goal Achievement: 01/12/17 Potential to Achieve Goals: Fair    Frequency Min 2X/week   Barriers to discharge         Co-evaluation               AM-PAC PT "6 Clicks" Daily Activity  Outcome Measure Difficulty turning over in bed (including adjusting bedclothes, sheets and blankets)?: Unable Difficulty moving from lying on back to sitting on the side of the bed? : Unable Difficulty sitting down on and standing up from a chair with arms (e.g., wheelchair, bedside commode, etc,.)?: Unable Help needed moving to and from a bed to chair (including a wheelchair)?: Total Help needed walking in hospital room?: Total Help needed climbing 3-5 steps with a railing? : Total 6 Click Score: 6    End of Session   Activity Tolerance: Patient limited by pain Patient left: in bed;with call bell/phone within reach;with bed alarm set   PT Visit Diagnosis: Muscle weakness (generalized) (M62.81);Difficulty in walking, not elsewhere classified (R26.2)    Time: 2979-8921 PT Time Calculation (min) (ACUTE ONLY): 13 min   Charges:   PT Evaluation $PT Eval Moderate Complexity: 1 Mod     PT G Codes:          Weston Anna, MPT Pager: [phone]

## 2016-12-29 NOTE — Care Management Important Message (Signed)
Important Message  Patient Details  Name: Autumn Bryant MRN: [id number] Date of Birth: 25-Jul-1935   Medicare Important Message Given:  Yes    Erenest Rasher, RN 12/29/2016, 9:36 AM

## 2016-12-29 NOTE — Progress Notes (Addendum)
11:12- Pt son returned call- states he spoke with provider and feels comfortable with pt discharge today. Report [phone]- room 102. Facility confirms they are prepared for pt's return.  Arranged [name] transport    Pt discharged and plans to return to [facility] where she is a long term care resident. CSW spoke with pt's son- states "I was told she wouldn't discharge till Monday to make sure infection doesn't set in." CSW informed him attending discharged pt this morning and questions re: her medical stability can be directed to medical staff- provided number for pt's unit.  Left voicemail with facility admissions to confirm they are prepared for pt to admit today. Sent DC summary in the [place]. Upon returned call will arrange transportation back to [facility], MSW, [place] Work 12/29/2016 [phone] Weekend coverage

## 2016-12-29 NOTE — [place] FL2 (Signed)
[place] MEDICAID FL2 LEVEL OF CARE SCREENING TOOL     IDENTIFICATION  Patient Name: Autumn Bryant Birthdate: 1935/03/12 Sex: female Admission Date (Current Location): 12/25/2016  [facility] and [place] Number:  Herbalist and Address:  [facility],  [address] [place], [place]      Provider Number: [id number]  Attending Physician Name and Address:  Charlynne Cousins, MD  Relative Name and Phone Number:       Current Level of Care: Hospital Recommended Level of Care: [place] Prior Approval Number:    Date Approved/Denied:   PASRR Number:  [id number]   Discharge Plan: SNF    Current Diagnoses: Patient Active Problem List   Diagnosis Date Noted  . Closed right hip fracture ([place]) 12/25/2016  . Anxiety 11/03/2016  . Dysphagia 07/13/2016  . Anasarca 05/21/2016  . Obesity (BMI 30.0-34.9) 05/21/2016  . Closed TBI (traumatic brain injury) ([place]) 04/21/2016  . Status post insertion of percutaneous endoscopic gastrostomy (PEG) tube ([place]) 04/21/2016  . Facial laceration 04/21/2016  . Cheek wound, right, subsequent encounter 04/21/2016  . Urinary tract infection due to extended-spectrum beta lactamase (ESBL) producing Escherichia coli 04/21/2016  . Hypotension 04/21/2016  . Gout   . Hypertension     Orientation RESPIRATION BLADDER Height & Weight     Self  O2(2L) Incontinent, Indwelling catheter Weight: 177 lb (80.3 kg) Height:  5\' 5"  (165.1 cm)  BEHAVIORAL SYMPTOMS/MOOD NEUROLOGICAL BOWEL NUTRITION STATUS      Continent Diet(See D/C Summary)  AMBULATORY STATUS COMMUNICATION OF NEEDS Skin   Extensive Assist Verbally Normal                       Personal Care Assistance Level of Assistance  Bathing, Feeding, Dressing Bathing Assistance: Limited assistance Feeding assistance: Independent Dressing Assistance: Limited assistance     Functional Limitations Info  Sight, Hearing, Speech Sight Info:  Adequate Hearing Info: Adequate Speech Info: Adequate    SPECIAL CARE FACTORS FREQUENCY  PT (By licensed PT), OT (By licensed OT)     PT Frequency: 5x/week OT Frequency: 5x/week            Contractures Contractures Info: Not present    Additional Factors Info  Code Status, Allergies, Psychotropic Code Status Info: Fullcode Allergies Info: Demerol Meperidine Psychotropic Info: Lexapro         Current Medications (12/29/2016):  This is the current hospital active medication list Current Facility-Administered Medications  Medication Dose Route Frequency Provider Last Rate Last Dose  . 0.9 %  sodium chloride infusion   Intravenous Continuous Paralee Cancel, MD   Stopped at 12/28/16 1126  . acetaminophen (TYLENOL) tablet 650 mg  650 mg Oral Q6H PRN Paralee Cancel, MD       Or  . acetaminophen (TYLENOL) suppository 650 mg  650 mg Rectal Q6H PRN Paralee Cancel, MD      . ALPRAZolam Duanne Moron) tablet 0.25 mg  0.25 mg Oral QHS PRN Caren Griffins, MD   0.25 mg at 12/26/16 2240  . alum & mag hydroxide-simeth (MAALOX/MYLANTA) 200-200-20 MG/5ML suspension 30 mL  30 mL Oral Q4H PRN Paralee Cancel, MD      . aspirin EC tablet 325 mg  325 mg Oral Q breakfast Paralee Cancel, MD   325 mg at 12/29/16 [phone]  . docusate sodium (COLACE) capsule 100 mg  100 mg Oral BID Paralee Cancel, MD   100 mg at 12/29/16 0947  . escitalopram (LEXAPRO) tablet  5 mg  5 mg Oral Daily Caren Griffins, MD   5 mg at 12/29/16 0946  . ferrous sulfate tablet 325 mg  325 mg Oral TID Court Joy, MD   325 mg at 12/29/16 9675  . furosemide (LASIX) tablet 20 mg  20 mg Oral Daily Charlynne Cousins, MD   20 mg at 12/29/16 0946  . gabapentin (NEURONTIN) capsule 200 mg  200 mg Oral TID Paralee Cancel, MD   200 mg at 12/29/16 0946  . HYDROcodone-acetaminophen (NORCO/VICODIN) 5-325 MG per tablet 1-2 tablet  1-2 tablet Oral Q6H PRN Paralee Cancel, MD   1 tablet at 12/29/16 1000  . menthol-cetylpyridinium (CEPACOL) lozenge 3 mg   1 lozenge Oral PRN Paralee Cancel, MD       Or  . phenol (CHLORASEPTIC) mouth spray 1 spray  1 spray Mouth/Throat PRN Paralee Cancel, MD      . methocarbamol (ROBAXIN) tablet 500 mg  500 mg Oral Q6H PRN Paralee Cancel, MD   500 mg at 12/29/16 0847   Or  . methocarbamol (ROBAXIN) 500 mg in dextrose 5 % 50 mL IVPB  500 mg Intravenous Q6H PRN Paralee Cancel, MD   Stopped at 12/27/16 1308  . metoCLOPramide (REGLAN) tablet 5-10 mg  5-10 mg Oral Q8H PRN Paralee Cancel, MD       Or  . metoCLOPramide (REGLAN) injection 5-10 mg  5-10 mg Intravenous Q8H PRN Paralee Cancel, MD      . morphine 4 MG/ML injection 2 mg  2 mg Intravenous Q3H PRN Schorr, Rhetta Mura, NP   2 mg at 12/27/16 0817  . nitrofurantoin (macrocrystal-monohydrate) (MACROBID) capsule 100 mg  100 mg Oral Q12H Charlynne Cousins, MD   100 mg at 12/29/16 0946  . ondansetron (ZOFRAN) tablet 4 mg  4 mg Oral Q6H PRN Paralee Cancel, MD       Or  . ondansetron [facility]) injection 4 mg  4 mg Intravenous Q6H PRN Paralee Cancel, MD      . polyethylene glycol (MIRALAX / GLYCOLAX) packet 17 g  17 g Oral Daily PRN Paralee Cancel, MD         Discharge Medications: TAKE these medications   acetaminophen 500 MG tablet Commonly known as:  TYLENOL Take 1,000 mg by mouth 3 (three) times daily.   ALPRAZolam 0.25 MG tablet Commonly known as:  XANAX Take 1 tablet (0.25 mg total) by mouth at bedtime as needed for anxiety. Take one tablet at bedtime for anxiety/agitation What changed:    when to take this  reasons to take this   escitalopram 5 MG tablet Commonly known as:  LEXAPRO Take 5 mg by mouth. Take one tablet daily for depression   furosemide 20 MG tablet Commonly known as:  LASIX Take 20 mg by mouth daily.   gabapentin 100 MG capsule Commonly known as:  NEURONTIN Take 200 mg by mouth 3 (three) times daily.   HYDROcodone-acetaminophen 5-325 MG tablet Commonly known as:  NORCO/VICODIN Take 1-2 tablets by mouth every 6 (six) hours as  needed for moderate pain.   OSMOLITE 1.2 CAL PO Initiate Osmolite 1.2 by tube  daily due to poor intake.   sterile water for irrigation Irrigate with 60 mLs as directed 3 (three) times daily.      Relevant Imaging Results:  Relevant Lab Results:   Additional Information SSN: [id number]  Nila Nephew, LCSW

## 2016-12-29 NOTE — Discharge Summary (Signed)
Physician Discharge Summary  Autumn Bryant UXN:[id number] DOB: 31-May-1935 DOA: 12/25/2016  PCP: [name], MD  Admit date: 12/25/2016 Discharge date: 12/29/2016  Admitted From: SNF Disposition:  SNF  Recommendations for Outpatient Follow-up:  1. Follow up with PCP in 1-2 weeks 2. Follow-up with orthopedic surgery in 2 weeks.  Home Health:No Equipment/Devices:none  Discharge Condition:stable CODE STATUS:DNR Diet recommendation: Heart Healthy   Brief/Interim Summary: 81 y.o. female past medical history significant for  intraparenchymal hematoma in February 2018 at [facility], hypertension and obesity comes in after right hip pain after fall the day prior to admission an x-ray showed right hip fracture.   Discharge Diagnoses:  Active Problems:   Hypertension   Closed TBI (traumatic brain injury) ([place])   Anasarca   Obesity (BMI 30.0-34.9)   Anxiety   Closed right hip fracture (HCC)  Closed right hip fracture: X-ray was done that showed a displaced right hip fracture, orthopedic surgery was consulted and recommended surgical intervention which was performed on 12/27/2016. Physical therapy evaluated the patient to recommend a skilled nursing facility.  New acute blood loss anemia: Her hemoglobin came down from 10-6.4 after surgical intervention. After 2 units to hemoglobin return to 8.0 and has remained stable at this point.  Acute encephalopathy with a history of traumatic brain injury with a recent intraparenchymal hematoma: CT scan of the head showed no acute findings, it is now resolved likely due to infectious etiology.  UTI: She was started empirically on IV Rocephin, urine culture grew E. coli sensitive to Macrobid.  Intermittent anasarca: Patient was on Lasix prior to admission, the echo was checked that showed grade 1 diastolic heart failure. We will continue low-dose Lasix as an outpatient.  Discharge Instructions  Discharge Instructions     Diet - low sodium heart healthy   Complete by:  As directed    Increase activity slowly   Complete by:  As directed      Allergies as of 12/29/2016      Reactions   Demerol [meperidine] Swelling      Medication List    TAKE these medications   acetaminophen 500 MG tablet Commonly known as:  TYLENOL Take 1,000 mg by mouth 3 (three) times daily.   ALPRAZolam 0.25 MG tablet Commonly known as:  XANAX Take 1 tablet (0.25 mg total) by mouth at bedtime as needed for anxiety. Take one tablet at bedtime for anxiety/agitation What changed:    when to take this  reasons to take this   escitalopram 5 MG tablet Commonly known as:  LEXAPRO Take 5 mg by mouth. Take one tablet daily for depression   furosemide 20 MG tablet Commonly known as:  LASIX Take 20 mg by mouth daily.   gabapentin 100 MG capsule Commonly known as:  NEURONTIN Take 200 mg by mouth 3 (three) times daily.   HYDROcodone-acetaminophen 5-325 MG tablet Commonly known as:  NORCO/VICODIN Take 1-2 tablets by mouth every 6 (six) hours as needed for moderate pain.   OSMOLITE 1.2 CAL PO Initiate Osmolite 1.2 by tube  daily due to poor intake.   sterile water for irrigation Irrigate with 60 mLs as directed 3 (three) times daily.      Contact information for after-discharge care    Destination    [facility] .   Service:  Skilled Nursing Contact information: [address] [place] [phone]             Allergies  Allergen Reactions  . Demerol [Meperidine] Swelling    Consultations:  Orthopedic surgery Dr.Olin   Procedures/Studies: Dg Shoulder Right  Result Date: 12/25/2016 CLINICAL DATA:  [place] 3 days ago.  Persistent shoulder pain. EXAM: RIGHT SHOULDER - 2+ VIEW COMPARISON:  None. FINDINGS: Anterior dislocation of the humeral head. Severe remote trauma involving the humeral head, neck and proximal shaft with healed fractures. The right lung  is grossly clear. Surgical changes involving the right breast are noted. IMPRESSION: Anterior subcoracoid dislocation of the humeral head. Severe remote trauma. Electronically Signed   By: Marijo Sanes M.D.   On: 12/25/2016 19:16   Ct Head Wo Contrast  Result Date: 12/25/2016 CLINICAL DATA:  Pain after fall. EXAM: CT HEAD WITHOUT CONTRAST CT CERVICAL SPINE WITHOUT CONTRAST TECHNIQUE: Multidetector CT imaging of the head and cervical spine was performed following the standard protocol without intravenous contrast. Multiplanar CT image reconstructions of the cervical spine were also generated. COMPARISON:  None. FINDINGS: CT HEAD FINDINGS Brain: Right anterior parietal lobe encephalomalacia with associated ex vacuo dilatation of the anterior horn of the right lateral ventricle. Superficial and central atrophy is noted. No acute intracranial hemorrhage, midline shift or edema. No large vascular territory infarct. No effacement of the basal cisterns or fourth ventricle. Vascular: Atherosclerotic calcifications of both vertebral arteries and carotid siphons. No hyperdense vessels. Skull: Right anterior parietal burr hole. No skull fracture or suspicious osseous lesions. Sinuses/Orbits: Right ethmoid sinus mucosal thickening noted posteriorly. No acute sinusitis. Clear mastoids. Other: Non CT CERVICAL SPINE FINDINGS Alignment: Normal cervical lordosis. Intact atlantodental interval with osteoarthritic joint space narrowing. Intact craniocervical relationship. Skull base and vertebrae: No acute fracture. No primary bone lesion or focal pathologic process. Soft tissues and spinal canal: No prevertebral fluid or swelling. No visible canal hematoma. Disc levels: No focal disc herniation or central canal stenosis. No jumped or perched facets. No significant neural foraminal encroachment. Mild multilevel degenerative joint space narrowing and spurring about the facets. Upper chest: Negative. Other: None IMPRESSION: 1.  Right anterior parietal encephalomalacia with ex vacuo dilatation of the anterior horn of the right lateral ventricle. This is likely a result of the reported remote traumatic brain injury the patient's history. There is atrophy with chronic small vessel ischemia. No acute intracranial abnormality. 2. No acute cervical spine fracture or posttraumatic subluxation. Mild multilevel facet arthropathy. Electronically Signed   By: [place] Royalty M.D.   On: 12/25/2016 21:10   Ct Cervical Spine Wo Contrast  Result Date: 12/25/2016 CLINICAL DATA:  Pain after fall. EXAM: CT HEAD WITHOUT CONTRAST CT CERVICAL SPINE WITHOUT CONTRAST TECHNIQUE: Multidetector CT imaging of the head and cervical spine was performed following the standard protocol without intravenous contrast. Multiplanar CT image reconstructions of the cervical spine were also generated. COMPARISON:  None. FINDINGS: CT HEAD FINDINGS Brain: Right anterior parietal lobe encephalomalacia with associated ex vacuo dilatation of the anterior horn of the right lateral ventricle. Superficial and central atrophy is noted. No acute intracranial hemorrhage, midline shift or edema. No large vascular territory infarct. No effacement of the basal cisterns or fourth ventricle. Vascular: Atherosclerotic calcifications of both vertebral arteries and carotid siphons. No hyperdense vessels. Skull: Right anterior parietal burr hole. No skull fracture or suspicious osseous lesions. Sinuses/Orbits: Right ethmoid sinus mucosal thickening noted posteriorly. No acute sinusitis. Clear mastoids. Other: Non CT CERVICAL SPINE FINDINGS Alignment: Normal cervical lordosis. Intact atlantodental interval with osteoarthritic joint space narrowing. Intact craniocervical relationship. Skull base and vertebrae: No acute fracture. No primary bone lesion or  focal pathologic process. Soft tissues and spinal canal: No prevertebral fluid or swelling. No visible canal hematoma. Disc levels: No focal disc  herniation or central canal stenosis. No jumped or perched facets. No significant neural foraminal encroachment. Mild multilevel degenerative joint space narrowing and spurring about the facets. Upper chest: Negative. Other: None IMPRESSION: 1. Right anterior parietal encephalomalacia with ex vacuo dilatation of the anterior horn of the right lateral ventricle. This is likely a result of the reported remote traumatic brain injury the patient's history. There is atrophy with chronic small vessel ischemia. No acute intracranial abnormality. 2. No acute cervical spine fracture or posttraumatic subluxation. Mild multilevel facet arthropathy. Electronically Signed   By: [place] Royalty M.D.   On: 12/25/2016 21:10   Dg C-arm 1-60 Min-no Report  Result Date: 12/27/2016 Fluoroscopy was utilized by the requesting physician.  No radiographic interpretation.   Dg Hip Unilat W Or Wo Pelvis 2-3 Views Right  Result Date: 12/25/2016 CLINICAL DATA:  Fall 3 days ago. Right hip pain. Initial encounter. EXAM: DG HIP (WITH OR WITHOUT PELVIS) 2-3V RIGHT COMPARISON:  None. FINDINGS: Acute intertrochanteric right femur fracture with varus angulation. Both hips are located. Prominent osteopenia. Moderate stool distending the rectum. IMPRESSION: 1. Intertrochanteric right femur fracture with varus angulation. 2. Marked osteopenia. 3. Prominent rectal stool volume. Electronically Signed   By: [place] Fantasia M.D.   On: 12/25/2016 19:16     Subjective: No complaints.  Discharge Exam: Vitals:   12/28/16 2006 12/29/16 0652  BP: 135/60 (!) 141/72  Pulse: 90 (!) 102  Resp: 14 19  Temp: 98.6 F (37 C) 98.2 F (36.8 C)  SpO2: 100% 94%   Vitals:   12/28/16 1604 12/28/16 1641 12/28/16 2006 12/29/16 0652  BP: (!) 109/53 (!) 106/48 135/60 (!) 141/72  Pulse: (!) 115 (!) 109 90 (!) 102  Resp: 16 16 14 19   Temp: 98.5 F (36.9 C) 98.9 F (37.2 C) 98.6 F (37 C) 98.2 F (36.8 C)  TempSrc: Oral Oral Axillary Oral  SpO2: 95%  93% 100% 94%  Weight:      Height:        General: Pt is alert, awake, not in acute distress Cardiovascular: RRR, S1/S2 +, no rubs, no gallops Respiratory: CTA bilaterally, no wheezing, no rhonchi Abdominal: Soft, NT, ND, bowel sounds + Extremities: no edema, no cyanosis    The results of significant diagnostics from this hospitalization (including imaging, microbiology, ancillary and laboratory) are listed below for reference.     Microbiology: Recent Results (from the past 240 hour(s))  Urine culture     Status: Abnormal   Collection Time: 12/25/16  7:38 PM  Result Value Ref Range Status   Specimen Description URINE, CATHETERIZED  Final   Special Requests NONE  Final   Culture (A)  Final    >=100,000 COLONIES/mL ESCHERICHIA COLI Confirmed Extended Spectrum Beta-Lactamase Producer (ESBL).  In bloodstream infections from ESBL organisms, carbapenems are preferred over piperacillin/tazobactam. They are shown to have a lower risk of mortality. Performed at [facility], [place] [address], [place], [place] [address]    Report Status 12/28/2016 FINAL  Final   Organism ID, Bacteria ESCHERICHIA COLI (A)  Final      Susceptibility   Escherichia coli - MIC*    AMPICILLIN >=32 RESISTANT Resistant     CEFAZOLIN >=64 RESISTANT Resistant     CEFTRIAXONE >=64 RESISTANT Resistant     CIPROFLOXACIN >=4 RESISTANT Resistant     GENTAMICIN <=1 SENSITIVE Sensitive  IMIPENEM <=0.25 SENSITIVE Sensitive     NITROFURANTOIN <=16 SENSITIVE Sensitive     TRIMETH/SULFA >=320 RESISTANT Resistant     AMPICILLIN/SULBACTAM >=32 RESISTANT Resistant     PIP/TAZO 8 SENSITIVE Sensitive     Extended ESBL POSITIVE Resistant     * >=100,000 COLONIES/mL ESCHERICHIA COLI  Blood culture (routine x 2)     Status: None (Preliminary result)   Collection Time: 12/25/16  8:30 PM  Result Value Ref Range Status   Specimen Description BLOOD RIGHT HAND  Final   Special Requests   Final    BOTTLES DRAWN AEROBIC  AND ANAEROBIC Blood Culture adequate volume   Culture   Final    NO GROWTH 2 DAYS Performed at [facility], [place] [address] [place], [place] [address]    Report Status PENDING  Incomplete  Blood culture (routine x 2)     Status: None (Preliminary result)   Collection Time: 12/25/16  9:43 PM  Result Value Ref Range Status   Specimen Description BLOOD BLOOD LEFT FOREARM  Final   Special Requests IN PEDIATRIC BOTTLE Blood Culture adequate volume  Final   Culture   Final    NO GROWTH 2 DAYS Performed at [facility], [place] [address], [place], [place] [address]    Report Status PENDING  Incomplete  MRSA PCR Screening     Status: None   Collection Time: 12/26/16  1:55 AM  Result Value Ref Range Status   MRSA by PCR NEGATIVE NEGATIVE Final    Comment:        The GeneXpert MRSA Assay (FDA approved for NASAL specimens only), is one component of a comprehensive MRSA colonization surveillance program. It is not intended to diagnose MRSA infection nor to guide or monitor treatment for MRSA infections.      Labs: BNP (last 3 results) No results for input(s): BNP in the last 8760 hours. Basic Metabolic Panel: Recent Labs  Lab 12/25/16 1800 12/26/16 0557 12/28/16 0558 12/29/16 0520  NA 137 142 138 142  K 3.6 3.8 4.2 3.5  CL 103 106 103 106  CO2 29 30 28 28   GLUCOSE 129* 119* 140* 94  BUN 14 14 15  21*  CREATININE 0.52 0.56 0.58 0.52  CALCIUM 8.5* 8.5* 8.1* 8.3*   Liver Function Tests: Recent Labs  Lab 12/25/16 1800 12/26/16 0557  AST 21 16  ALT 15 12*  ALKPHOS 69 63  BILITOT 0.7 0.6  PROT 6.0* 5.7*  ALBUMIN 3.2* 2.9*   No results for input(s): LIPASE, AMYLASE in the last 168 hours. No results for input(s): AMMONIA in the last 168 hours. CBC: Recent Labs  Lab 12/25/16 1800 12/26/16 0557 12/28/16 0558 12/28/16 2137 12/29/16 0520  WBC 10.6* 10.8* 6.6  --  10.2  NEUTROABS 8.7*  --   --   --   --   HGB 11.5* 10.1* 6.4* 8.4* 8.0*  HCT 35.4* 31.4*  18.9* 25.0* 23.8*  MCV 89.6 91.5 88.3  --  89.8  PLT 172 176 137*  --  166   Cardiac Enzymes: No results for input(s): CKTOTAL, CKMB, CKMBINDEX, TROPONINI in the last 168 hours. BNP: Invalid input(s): POCBNP CBG: No results for input(s): GLUCAP in the last 168 hours. D-Dimer No results for input(s): DDIMER in the last 72 hours. Hgb A1c No results for input(s): HGBA1C in the last 72 hours. Lipid Profile No results for input(s): CHOL, HDL, LDLCALC, TRIG, CHOLHDL, LDLDIRECT in the last 72 hours. Thyroid function studies No  results for input(s): TSH, T4TOTAL, T3FREE, THYROIDAB in the last 72 hours.  Invalid input(s): FREET3 Anemia work up No results for input(s): VITAMINB12, FOLATE, FERRITIN, TIBC, IRON, RETICCTPCT in the last 72 hours. Urinalysis    Component Value Date/Time   COLORURINE AMBER (A) 12/25/2016 1938   APPEARANCEUR HAZY (A) 12/25/2016 1938   LABSPEC 1.025 12/25/2016 1938   PHURINE 5.0 12/25/2016 1938   GLUCOSEU NEGATIVE 12/25/2016 1938   HGBUR NEGATIVE 12/25/2016 1938   BILIRUBINUR NEGATIVE 12/25/2016 1938   KETONESUR NEGATIVE 12/25/2016 1938   PROTEINUR NEGATIVE 12/25/2016 1938   NITRITE POSITIVE (A) 12/25/2016 1938   LEUKOCYTESUR SMALL (A) 12/25/2016 1938   Sepsis Labs Invalid input(s): PROCALCITONIN,  WBC,  LACTICIDVEN Microbiology Recent Results (from the past 240 hour(s))  Urine culture     Status: Abnormal   Collection Time: 12/25/16  7:38 PM  Result Value Ref Range Status   Specimen Description URINE, CATHETERIZED  Final   Special Requests NONE  Final   Culture (A)  Final    >=100,000 COLONIES/mL ESCHERICHIA COLI Confirmed Extended Spectrum Beta-Lactamase Producer (ESBL).  In bloodstream infections from ESBL organisms, carbapenems are preferred over piperacillin/tazobactam. They are shown to have a lower risk of mortality. Performed at [facility], [place] [address] [place], [place] [address]    Report Status 12/28/2016 FINAL  Final   Organism  ID, Bacteria ESCHERICHIA COLI (A)  Final      Susceptibility   Escherichia coli - MIC*    AMPICILLIN >=32 RESISTANT Resistant     CEFAZOLIN >=64 RESISTANT Resistant     CEFTRIAXONE >=64 RESISTANT Resistant     CIPROFLOXACIN >=4 RESISTANT Resistant     GENTAMICIN <=1 SENSITIVE Sensitive     IMIPENEM <=0.25 SENSITIVE Sensitive     NITROFURANTOIN <=16 SENSITIVE Sensitive     TRIMETH/SULFA >=320 RESISTANT Resistant     AMPICILLIN/SULBACTAM >=32 RESISTANT Resistant     PIP/TAZO 8 SENSITIVE Sensitive     Extended ESBL POSITIVE Resistant     * >=100,000 COLONIES/mL ESCHERICHIA COLI  Blood culture (routine x 2)     Status: None (Preliminary result)   Collection Time: 12/25/16  8:30 PM  Result Value Ref Range Status   Specimen Description BLOOD RIGHT HAND  Final   Special Requests   Final    BOTTLES DRAWN AEROBIC AND ANAEROBIC Blood Culture adequate volume   Culture   Final    NO GROWTH 2 DAYS Performed at [facility], [place] [address] [place], [place] [address]    Report Status PENDING  Incomplete  Blood culture (routine x 2)     Status: None (Preliminary result)   Collection Time: 12/25/16  9:43 PM  Result Value Ref Range Status   Specimen Description BLOOD BLOOD LEFT FOREARM  Final   Special Requests IN PEDIATRIC BOTTLE Blood Culture adequate volume  Final   Culture   Final    NO GROWTH 2 DAYS Performed at [facility], [place] [address], [place], [place] [address]    Report Status PENDING  Incomplete  MRSA PCR Screening     Status: None   Collection Time: 12/26/16  1:55 AM  Result Value Ref Range Status   MRSA by PCR NEGATIVE NEGATIVE Final    Comment:        The GeneXpert MRSA Assay (FDA approved for NASAL specimens only), is one component of a comprehensive MRSA colonization surveillance program. It is not intended to diagnose MRSA infection nor to guide or  monitor treatment for MRSA infections.      Time coordinating discharge: Over 30  minutes  SIGNED:   Charlynne Cousins, MD  [facility] 12/29/2016, 8:31 AM Pager   If 7PM-7AM, please contact night-coverage [email or web address] Password [email or web address]

## 2016-12-29 NOTE — Progress Notes (Addendum)
Discharged from floor via stretcher for transport to [facility] by ambulance. Belongings & EMT with pt. No changes in assessment. Report called to Olu, Therapist, sports, at [place].  Tamora Huneke, [employer]

## 2016-12-30 ENCOUNTER — Telehealth: Payer: Self-pay

## 2016-12-30 ENCOUNTER — Encounter: Payer: Self-pay | Admitting: Internal Medicine

## 2016-12-30 ENCOUNTER — Non-Acute Institutional Stay (SKILLED_NURSING_FACILITY): Payer: Medicare Other | Admitting: Internal Medicine

## 2016-12-30 DIAGNOSIS — G9340 Encephalopathy, unspecified: Secondary | ICD-10-CM

## 2016-12-30 DIAGNOSIS — F419 Anxiety disorder, unspecified: Secondary | ICD-10-CM

## 2016-12-30 DIAGNOSIS — B9620 Unspecified Escherichia coli [E. coli] as the cause of diseases classified elsewhere: Secondary | ICD-10-CM

## 2016-12-30 DIAGNOSIS — R601 Generalized edema: Secondary | ICD-10-CM

## 2016-12-30 DIAGNOSIS — D62 Acute posthemorrhagic anemia: Secondary | ICD-10-CM | POA: Diagnosis not present

## 2016-12-30 DIAGNOSIS — G629 Polyneuropathy, unspecified: Secondary | ICD-10-CM | POA: Diagnosis not present

## 2016-12-30 DIAGNOSIS — Z96641 Presence of right artificial hip joint: Secondary | ICD-10-CM | POA: Diagnosis not present

## 2016-12-30 DIAGNOSIS — N390 Urinary tract infection, site not specified: Secondary | ICD-10-CM

## 2016-12-30 DIAGNOSIS — F339 Major depressive disorder, recurrent, unspecified: Secondary | ICD-10-CM

## 2016-12-30 DIAGNOSIS — S72001D Fracture of unspecified part of neck of right femur, subsequent encounter for closed fracture with routine healing: Secondary | ICD-10-CM

## 2016-12-30 LAB — BPAM RBC
BLOOD PRODUCT EXPIRATION DATE: 201812122359
Blood Product Expiration Date: 201812122359
ISSUE DATE / TIME: 201811241101
ISSUE DATE / TIME: 201811241611
UNIT TYPE AND RH: 6200
Unit Type and Rh: 6200

## 2016-12-30 LAB — TYPE AND SCREEN
ABO/RH(D): A POS
Antibody Screen: NEGATIVE
Unit division: 0
Unit division: 0

## 2016-12-30 NOTE — Progress Notes (Signed)
: Provider:  [name], MD Location:  [place] Room Number: 885 Place of Service:  SNF ([phone])  PCP: [name], MD [name], [name]  Extended Emergency Contact Information Primary Emergency Contact: [name] Address: [address]          [place], [place] [address] [name] of [place] Phone: [phone] Relation: Son Secondary Emergency Contact: [name],[name]          [place], [place] Relation: Sister     Allergies: Demerol [meperidine]  Chief Complaint  Patient presents with  . Readmit To SNF    following hospitalization 12/25/16 to 12/29/16 right displaced hip fracture following a fall.     HPI: Patient is 81 y.o. female history TBI, hypertension, obesity, anasarca, who presented to [facility] and goes ED after a fall's skilled nursing facility yesterday and an x-ray in skilled nursing facility-on an intertrochanteric fracture. Patient was admitted to [facility] from 11/21-25 with patient was treated with with a right hip arthroplasty, had acute blood loss anemia postop which required transfusion of 2 units PRBC, and was empirically started on IV Rocephin for a urine grew out Escherichia coli sensitive to Macrobid. Patient underwent an echo which showed grade 1 diastolic heart failure for which her prior low dose Lasix was recommended. Patient is admitted back to skilled nursing facility for residential care. While at skilled nursing facility patient will be followed for anxiety treated with Xanax, depression treated with Lexapro, and polyneuropathy treated with Neurontin.  Past Medical History:  Diagnosis Date  . Anasarca 05/21/2016  . Anxiety 11/03/2016  . Closed TBI (traumatic brain injury) ([place]) 04/21/2016  . Dysphagia 07/13/2016  . Gout   . Hypertension   . Obesity (BMI 30.0-34.9) 05/21/2016  . Status post insertion of  percutaneous endoscopic gastrostomy (PEG) tube ([place]) 04/21/2016    Past Surgical History:  Procedure Laterality Date  . INTRAMEDULLARY (IM) NAIL INTERTROCHANTERIC Right 12/27/2016   Procedure: INTRAMEDULLARY (IM) NAIL INTERTROCHANTRIC RIGHT HIP;  Surgeon: Paralee Cancel, MD;  Location: [facility];  Service: Orthopedics;  Laterality: Right;  . MASTECTOMY      Allergies as of 12/30/2016      Reactions   Demerol [meperidine] Swelling      Medication List        Accurate as of 12/30/16  4:03 PM. Always use your most recent med list.          acetaminophen 500 MG tablet Commonly known as:  TYLENOL Take 1,000 mg by mouth 3 (three) times daily.   ALPRAZolam 0.25 MG tablet Commonly known as:  XANAX Take 1 tablet (0.25 mg total) by mouth at bedtime as needed for anxiety. Take one tablet at bedtime for anxiety/agitation   escitalopram 5 MG tablet Commonly known as:  LEXAPRO Take 5 mg by mouth. Take one tablet daily for depression   furosemide 20 MG tablet Commonly known as:  LASIX Take 20 mg by mouth daily.   gabapentin 100 MG capsule Commonly known as:  NEURONTIN Take 200 mg by mouth 3 (three) times daily.   HYDROcodone-acetaminophen 5-325 MG tablet Commonly known as:  NORCO/VICODIN Take 1-2 tablets by mouth every 6 (six) hours as needed for moderate pain.   nitrofurantoin (macrocrystal-monohydrate) 100 MG capsule Commonly known as:  MACROBID Take 1 capsule (100 mg total) by mouth every 12 (twelve) hours.   OSMOLITE 1.2 CAL PO Initiate Osmolite  1.2 by tube  daily due to poor intake.   sterile water for irrigation Irrigate with 60 mLs as directed 3 (three) times daily.       No orders of the defined types were placed in this encounter.   Immunization History  Administered Date(s) Administered  . Influenza-Unspecified 12/19/2016  . PPD Test 04/29/2016  . Pneumococcal Polysaccharide-23 04/15/2016  . Tdap 03/31/2016    Social History   Tobacco Use  . Smoking  status: Never Smoker  . Smokeless tobacco: Never Used  Substance Use Topics  . Alcohol use: No    Family history is   Family History  Problem Relation Age of Onset  . Asthma Mother   . Anesthesia problems Neg Hx   . Arthritis Neg Hx   . Cancer Neg Hx   . Cerebral palsy Neg Hx   . Clotting disorder Neg Hx   . Club foot Neg Hx   . Collagen disease Neg Hx   . Deep vein thrombosis Neg Hx   . Gait disorder Neg Hx   . Heart disease Neg Hx   . Gout Neg Hx   . Diabetes Neg Hx   . Hip dysplasia Neg Hx   . Hip fracture Neg Hx   . Hypermobility Neg Hx   . Hypertension Neg Hx   . Osteoporosis Neg Hx   . Other Neg Hx   . Stroke Neg Hx   . Thyroid disease Neg Hx   . Vasculitis Neg Hx   . Spina bifida Neg Hx   . Scoliosis Neg Hx   . Rheumatologic disease Neg Hx   . Pulmonary embolism Neg Hx       Review of Systems  DATA OBTAINED: from patient-limited; nursing-no concerns GENERAL:  no fevers, fatigue, appetite changes SKIN: No itching, or rash EYES: No eye pain, redness, discharge EARS: No earache, tinnitus, change in hearing NOSE: No congestion, drainage or bleeding  MOUTH/THROAT: No mouth or tooth pain, No sore throat RESPIRATORY: No cough, wheezing, SOB CARDIAC: No chest pain, palpitations, lower extremity edema  GI: No abdominal pain, No N/V/D or constipation, No heartburn or reflux  GU: No dysuria, frequency or urgency, or incontinence  MUSCULOSKELETAL: No unrelieved bone/joint pain NEUROLOGIC: No headache, dizziness or focal weakness PSYCHIATRIC: No c/o anxiety or sadness   Vitals:   12/30/16 1540  BP: 120/77  Pulse: 82  Resp: 20  Temp: 97.8 F (36.6 C)  SpO2: 96%    SpO2 Readings from Last 1 Encounters:  12/30/16 96%   Body mass index is 29.45 kg/m.     Physical Exam  GENERAL APPEARANCE: Alert, conversant,  No acute distress.  SKIN: Incision is no redness mild fatigue minimal swelling HEAD: Normocephalic, atraumatic  EYES: Conjunctiva/lids clear.  Pupils round, reactive. EOMs intact.  EARS: External exam WNL, canals clear. Hearing grossly normal.  NOSE: No deformity or discharge.  MOUTH/THROAT: Lips w/o lesions  RESPIRATORY: Breathing is even, unlabored. Lung sounds are clear   CARDIOVASCULAR: Heart RRR no murmurs, rubs or gallops. No peripheral edema.   GASTROINTESTINAL: Abdomen is soft, non-tender, not distended w/ normal bowel sounds. GENITOURINARY: Bladder non tender, not distended  MUSCULOSKELETAL: No abnormal joints or musculature NEUROLOGIC:  Cranial nerves 2-12 grossly intact. Moves all extremities  PSYCHIATRIC: Mood and affect with baseline status post TBI, no behavioral issues  Patient Active Problem List   Diagnosis Date Noted  . Closed right hip fracture ([place]) 12/25/2016  . Anxiety 11/03/2016  . Dysphagia 07/13/2016  . Anasarca  05/21/2016  . Obesity (BMI 30.0-34.9) 05/21/2016  . Closed TBI (traumatic brain injury) ([place]) 04/21/2016  . Status post insertion of percutaneous endoscopic gastrostomy (PEG) tube ([place]) 04/21/2016  . Facial laceration 04/21/2016  . Cheek wound, right, subsequent encounter 04/21/2016  . Urinary tract infection due to extended-spectrum beta lactamase (ESBL) producing Escherichia coli 04/21/2016  . Hypotension 04/21/2016  . Gout   . Hypertension       Labs reviewed: Basic Metabolic Panel:    Component Value Date/Time   NA 142 12/29/2016 0520   NA 143 10/21/2016   K 3.5 12/29/2016 0520   CL 106 12/29/2016 0520   CO2 28 12/29/2016 0520   GLUCOSE 94 12/29/2016 0520   BUN 21 (H) 12/29/2016 0520   BUN 13 10/21/2016   CREATININE 0.52 12/29/2016 0520   CALCIUM 8.3 (L) 12/29/2016 0520   PROT 5.7 (L) 12/26/2016 0557   ALBUMIN 2.9 (L) 12/26/2016 0557   AST 16 12/26/2016 0557   ALT 12 (L) 12/26/2016 0557   ALKPHOS 63 12/26/2016 0557   BILITOT 0.6 12/26/2016 0557   GFRNONAA >60 12/29/2016 0520   GFRAA >60 12/29/2016 0520    Recent Labs    12/26/16 0557 12/28/16 0558 12/29/16 0520    NA 142 138 142  K 3.8 4.2 3.5  CL 106 103 106  CO2 30 28 28   GLUCOSE 119* 140* 94  BUN 14 15 21*  CREATININE 0.56 0.58 0.52  CALCIUM 8.5* 8.1* 8.3*   Liver Function Tests: Recent Labs    12/25/16 1800 12/26/16 0557  AST 21 16  ALT 15 12*  ALKPHOS 69 63  BILITOT 0.7 0.6  PROT 6.0* 5.7*  ALBUMIN 3.2* 2.9*   No results for input(s): LIPASE, AMYLASE in the last 8760 hours. No results for input(s): AMMONIA in the last 8760 hours. CBC: Recent Labs    12/25/16 1800 12/26/16 0557 12/28/16 0558 12/28/16 2137 12/29/16 0520  WBC 10.6* 10.8* 6.6  --  10.2  NEUTROABS 8.7*  --   --   --   --   HGB 11.5* 10.1* 6.4* 8.4* 8.0*  HCT 35.4* 31.4* 18.9* 25.0* 23.8*  MCV 89.6 91.5 88.3  --  89.8  PLT 172 176 137*  --  166   Lipid No results for input(s): CHOL, HDL, LDLCALC, TRIG in the last 8760 hours.  Cardiac Enzymes: No results for input(s): CKTOTAL, CKMB, CKMBINDEX, TROPONINI in the last 8760 hours. BNP: No results for input(s): BNP in the last 8760 hours. No results found for: MICROALBUR No results found for: HGBA1C Lab Results  Component Value Date   TSH 1.68 08/06/2013   No results found for: VITAMINB12 No results found for: FOLATE No results found for: IRON, TIBC, FERRITIN  Imaging and Procedures obtained prior to SNF admission: Dg Shoulder Right  Result Date: 12/25/2016 CLINICAL DATA:  [place] 3 days ago.  Persistent shoulder pain. EXAM: RIGHT SHOULDER - 2+ VIEW COMPARISON:  None. FINDINGS: Anterior dislocation of the humeral head. Severe remote trauma involving the humeral head, neck and proximal shaft with healed fractures. The right lung is grossly clear. Surgical changes involving the right breast are noted. IMPRESSION: Anterior subcoracoid dislocation of the humeral head. Severe remote trauma. Electronically Signed   By: Marijo Sanes M.D.   On: 12/25/2016 19:16   Ct Head Wo Contrast  Result Date: 12/25/2016 CLINICAL DATA:  Pain after fall. EXAM: CT HEAD WITHOUT  CONTRAST CT CERVICAL SPINE WITHOUT CONTRAST TECHNIQUE: Multidetector CT imaging of the head and cervical  spine was performed following the standard protocol without intravenous contrast. Multiplanar CT image reconstructions of the cervical spine were also generated. COMPARISON:  None. FINDINGS: CT HEAD FINDINGS Brain: Right anterior parietal lobe encephalomalacia with associated ex vacuo dilatation of the anterior horn of the right lateral ventricle. Superficial and central atrophy is noted. No acute intracranial hemorrhage, midline shift or edema. No large vascular territory infarct. No effacement of the basal cisterns or fourth ventricle. Vascular: Atherosclerotic calcifications of both vertebral arteries and carotid siphons. No hyperdense vessels. Skull: Right anterior parietal burr hole. No skull fracture or suspicious osseous lesions. Sinuses/Orbits: Right ethmoid sinus mucosal thickening noted posteriorly. No acute sinusitis. Clear mastoids. Other: Non CT CERVICAL SPINE FINDINGS Alignment: Normal cervical lordosis. Intact atlantodental interval with osteoarthritic joint space narrowing. Intact craniocervical relationship. Skull base and vertebrae: No acute fracture. No primary bone lesion or focal pathologic process. Soft tissues and spinal canal: No prevertebral fluid or swelling. No visible canal hematoma. Disc levels: No focal disc herniation or central canal stenosis. No jumped or perched facets. No significant neural foraminal encroachment. Mild multilevel degenerative joint space narrowing and spurring about the facets. Upper chest: Negative. Other: None IMPRESSION: 1. Right anterior parietal encephalomalacia with ex vacuo dilatation of the anterior horn of the right lateral ventricle. This is likely a result of the reported remote traumatic brain injury the patient's history. There is atrophy with chronic small vessel ischemia. No acute intracranial abnormality. 2. No acute cervical spine fracture or  posttraumatic subluxation. Mild multilevel facet arthropathy. Electronically Signed   By: Ashley Royalty M.D.   On: 12/25/2016 21:10   Ct Cervical Spine Wo Contrast  Result Date: 12/25/2016 CLINICAL DATA:  Pain after fall. EXAM: CT HEAD WITHOUT CONTRAST CT CERVICAL SPINE WITHOUT CONTRAST TECHNIQUE: Multidetector CT imaging of the head and cervical spine was performed following the standard protocol without intravenous contrast. Multiplanar CT image reconstructions of the cervical spine were also generated. COMPARISON:  None. FINDINGS: CT HEAD FINDINGS Brain: Right anterior parietal lobe encephalomalacia with associated ex vacuo dilatation of the anterior horn of the right lateral ventricle. Superficial and central atrophy is noted. No acute intracranial hemorrhage, midline shift or edema. No large vascular territory infarct. No effacement of the basal cisterns or fourth ventricle. Vascular: Atherosclerotic calcifications of both vertebral arteries and carotid siphons. No hyperdense vessels. Skull: Right anterior parietal burr hole. No skull fracture or suspicious osseous lesions. Sinuses/Orbits: Right ethmoid sinus mucosal thickening noted posteriorly. No acute sinusitis. Clear mastoids. Other: Non CT CERVICAL SPINE FINDINGS Alignment: Normal cervical lordosis. Intact atlantodental interval with osteoarthritic joint space narrowing. Intact craniocervical relationship. Skull base and vertebrae: No acute fracture. No primary bone lesion or focal pathologic process. Soft tissues and spinal canal: No prevertebral fluid or swelling. No visible canal hematoma. Disc levels: No focal disc herniation or central canal stenosis. No jumped or perched facets. No significant neural foraminal encroachment. Mild multilevel degenerative joint space narrowing and spurring about the facets. Upper chest: Negative. Other: None IMPRESSION: 1. Right anterior parietal encephalomalacia with ex vacuo dilatation of the anterior horn of the  right lateral ventricle. This is likely a result of the reported remote traumatic brain injury the patient's history. There is atrophy with chronic small vessel ischemia. No acute intracranial abnormality. 2. No acute cervical spine fracture or posttraumatic subluxation. Mild multilevel facet arthropathy. Electronically Signed   By: Ashley Royalty M.D.   On: 12/25/2016 21:10   Dg Hip Unilat W Or Wo Pelvis 2-3 Views Right  Result  Date: 12/25/2016 CLINICAL DATA:  Fall 3 days ago. Right hip pain. Initial encounter. EXAM: DG HIP (WITH OR WITHOUT PELVIS) 2-3V RIGHT COMPARISON:  None. FINDINGS: Acute intertrochanteric right femur fracture with varus angulation. Both hips are located. Prominent osteopenia. Moderate stool distending the rectum. IMPRESSION: 1. Intertrochanteric right femur fracture with varus angulation. 2. Marked osteopenia. 3. Prominent rectal stool volume. Electronically Signed   By: Monte Fantasia M.D.   On: 12/25/2016 19:16     Not all labs, radiology exams or other studies done during hospitalization come through on my EPIC note; however they are reviewed by me.    Assessment and Plan  INTERTROCHANTERIC FRACTURE/STATUS post RIGHT HIP ARTHROPLASTY SNF - admitted to skilled nursing facility for residential care and for OT/PT  ACUTE BLOOD LOSS ANEMIA postop-hemoglobin from 10-6.4; patient received 2 PRBC; SNF- DC hemoglobin 8.0; will follow-up CBC  E COLI UTI-was treated with Rocephin initially and when urine culture grew greater than 100,000 Escherichia coli pansensitive patient was changed over to [place] with HISTORY TBI-CT head showed no acute findings; likely due to infectious etiology  INTERMITTENT ANASARCA-patient was on Lasix prior to admission; echo was checked that showed grade 1 diastolic heart failure; continue Lasix SNF - continue Lasix 20 mg by mouth daily  ANXIETY SNF - continue when necessary Xanax  DEPRESSION SNF - stable; continue  Lexapro 5 mg by mouth daily  POLYNEUROPATHY SNF - stable; continue Neurontin 200 mg by mouth 3 times a day  Time spent greater than 35 minutes;> 50% of time with patient was spent reviewing records, labs, tests and studies, counseling and developing plan of care  Webb Silversmith D. [name], MD

## 2016-12-30 NOTE — Telephone Encounter (Signed)
Possible re-admission to facility. This is a patient you were seeing at [facility]. [facility] F/U is needed if patient was re-admitted to facility upon discharge. Hospital discharge from [facility] on  12/29/16.

## 2016-12-31 LAB — CULTURE, BLOOD (ROUTINE X 2)
Culture: NO GROWTH
Culture: NO GROWTH
SPECIAL REQUESTS: ADEQUATE
Special Requests: ADEQUATE

## 2017-01-02 DIAGNOSIS — F329 Major depressive disorder, single episode, unspecified: Secondary | ICD-10-CM | POA: Diagnosis not present

## 2017-01-02 DIAGNOSIS — F419 Anxiety disorder, unspecified: Secondary | ICD-10-CM | POA: Diagnosis not present

## 2017-01-05 ENCOUNTER — Encounter: Payer: Self-pay | Admitting: Internal Medicine

## 2017-01-05 DIAGNOSIS — B9620 Unspecified Escherichia coli [E. coli] as the cause of diseases classified elsewhere: Secondary | ICD-10-CM | POA: Insufficient documentation

## 2017-01-05 DIAGNOSIS — G9340 Encephalopathy, unspecified: Secondary | ICD-10-CM | POA: Insufficient documentation

## 2017-01-05 DIAGNOSIS — N390 Urinary tract infection, site not specified: Secondary | ICD-10-CM

## 2017-01-05 DIAGNOSIS — F339 Major depressive disorder, recurrent, unspecified: Secondary | ICD-10-CM | POA: Insufficient documentation

## 2017-01-05 DIAGNOSIS — Z96641 Presence of right artificial hip joint: Secondary | ICD-10-CM | POA: Insufficient documentation

## 2017-01-05 DIAGNOSIS — D62 Acute posthemorrhagic anemia: Secondary | ICD-10-CM | POA: Insufficient documentation

## 2017-01-05 DIAGNOSIS — G629 Polyneuropathy, unspecified: Secondary | ICD-10-CM | POA: Insufficient documentation

## 2017-01-10 DIAGNOSIS — S72141D Displaced intertrochanteric fracture of right femur, subsequent encounter for closed fracture with routine healing: Secondary | ICD-10-CM | POA: Diagnosis not present

## 2017-01-10 DIAGNOSIS — Z4789 Encounter for other orthopedic aftercare: Secondary | ICD-10-CM | POA: Diagnosis not present

## 2017-01-11 LAB — BASIC METABOLIC PANEL
BUN: 13 (ref 4–21)
CREATININE: 0.4 — AB (ref 0.5–1.1)
Glucose: 88
POTASSIUM: 3.6 (ref 3.4–5.3)
SODIUM: 141 (ref 137–147)

## 2017-01-11 LAB — CBC AND DIFFERENTIAL
HCT: 26 — AB (ref 36–46)
Hemoglobin: 8.8 — AB (ref 12.0–16.0)
Platelets: 284 (ref 150–399)
WBC: 7.6

## 2017-01-16 ENCOUNTER — Other Ambulatory Visit: Payer: Self-pay

## 2017-01-22 ENCOUNTER — Encounter: Payer: Self-pay | Admitting: Internal Medicine

## 2017-01-22 ENCOUNTER — Non-Acute Institutional Stay (SKILLED_NURSING_FACILITY): Payer: Medicare Other | Admitting: Internal Medicine

## 2017-01-22 DIAGNOSIS — F419 Anxiety disorder, unspecified: Secondary | ICD-10-CM | POA: Diagnosis not present

## 2017-01-22 DIAGNOSIS — F339 Major depressive disorder, recurrent, unspecified: Secondary | ICD-10-CM

## 2017-01-22 DIAGNOSIS — M109 Gout, unspecified: Secondary | ICD-10-CM | POA: Diagnosis not present

## 2017-01-22 NOTE — Progress Notes (Signed)
Location:  [place] Room Number: 211D Place of Service:  SNF (31)  [name], MD  [name], [name]  Extended Emergency Contact Information Primary Emergency Contact: [name] Address: [address]          [place], [place] [address] [name] of [place] Phone: [phone] Relation: Son Secondary Emergency Contact: [name],[name]          [place], [place] Relation: Sister    Allergies: Demerol [meperidine]  Chief Complaint  Patient presents with  . Medical Management of Chronic Issues    routine visit    HPI: Patient is 81 y.o. female who is being seen for routine issues of gout, depression, and anxiety.  Past Medical History:  Diagnosis Date  . Anasarca 05/21/2016  . Anxiety 11/03/2016  . Closed TBI (traumatic brain injury) ([place]) 04/21/2016  . Dysphagia 07/13/2016  . Gout   . Hypertension   . Obesity (BMI 30.0-34.9) 05/21/2016  . Status post insertion of percutaneous endoscopic gastrostomy (PEG) tube ([place]) 04/21/2016    Past Surgical History:  Procedure Laterality Date  . INTRAMEDULLARY (IM) NAIL INTERTROCHANTERIC Right 12/27/2016   Procedure: INTRAMEDULLARY (IM) NAIL INTERTROCHANTRIC RIGHT HIP;  Surgeon: Paralee Cancel, MD;  Location: [facility];  Service: Orthopedics;  Laterality: Right;  . MASTECTOMY      Allergies as of 01/22/2017      Reactions   Demerol [meperidine] Swelling      Medication List        Accurate as of 01/22/17 11:59 PM. Always use your most recent med list.          acetaminophen 500 MG tablet Commonly known as:  TYLENOL Take 1,000 mg by mouth 3 (three) times daily.   ALPRAZolam 0.25 MG tablet Commonly known as:  XANAX Take 1 tablet (0.25 mg total) by mouth at bedtime as needed for anxiety. Take one tablet at bedtime for anxiety/agitation   escitalopram 5 MG tablet Commonly known as:   LEXAPRO Take 5 mg by mouth daily. depression   furosemide 20 MG tablet Commonly known as:  LASIX Take 20 mg by mouth daily.   gabapentin 100 MG capsule Commonly known as:  NEURONTIN Take 200 mg by mouth 3 (three) times daily.   HYDROcodone-acetaminophen 5-325 MG tablet Commonly known as:  NORCO/VICODIN Take by mouth. Take one tablet twice daily for pain. Take one tablet every 6 hours as needed for moderate pain   methocarbamol 500 MG tablet Commonly known as:  ROBAXIN Take 500 mg by mouth 3 (three) times daily. For  muscle spasm   SENNA-S PO Take by mouth. Take 2 tablets twice daily as needed for constipation       No orders of the defined types were placed in this encounter.   Immunization History  Administered Date(s) Administered  . Influenza-Unspecified 12/19/2016  . PPD Test 04/29/2016  . Pneumococcal Polysaccharide-23 04/15/2016  . Tdap 03/31/2016    Social History   Tobacco Use  . Smoking status: Never Smoker  . Smokeless tobacco: Never Used  Substance Use Topics  . Alcohol use: No    Review of Systems  DATA OBTAINED: from patient-limited secondary to TBI; nursing-no acute concerns GENERAL:  no fevers, fatigue, appetite changes SKIN: No itching, rash HEENT: No complaint RESPIRATORY: No cough, wheezing, SOB CARDIAC: No chest pain, palpitations, lower extremity edema  GI: No abdominal pain, No N/V/D or constipation, No  heartburn or reflux  GU: No dysuria, frequency or urgency, or incontinence  MUSCULOSKELETAL: No unrelieved bone/joint pain NEUROLOGIC: No headache, dizziness  PSYCHIATRIC: No overt anxiety or sadness  Vitals:   01/22/17 2315  BP: 114/80  Pulse: (!) 103  Resp: 20  Temp: 99.4 F (37.4 C)  SpO2: 98%   Body mass index is 30.29 kg/m. Physical Exam  GENERAL APPEARANCE: Alert, conversant, No acute distress  SKIN: No diaphoresis rash HEENT: Unremarkable RESPIRATORY: Breathing is even, unlabored. Lung sounds are clear    CARDIOVASCULAR: Heart RRR no murmurs, rubs or gallops. No peripheral edema  GASTROINTESTINAL: Abdomen is soft, non-tender, not distended w/ normal bowel sounds.  GENITOURINARY: Bladder non tender, not distended  MUSCULOSKELETAL: No abnormal joints or musculature NEUROLOGIC: Cranial nerves 2-12 grossly intact. Moves all extremities PSYCHIATRIC: Mood and affect baseline status post TBI, no behavioral issues  Patient Active Problem List   Diagnosis Date Noted  . History of total right hip arthroplasty 01/05/2017  . Acute blood loss as cause of postoperative anemia 01/05/2017  . Escherichia coli urinary tract infection 01/05/2017  . Acute encephalopathy 01/05/2017  . Depression, recurrent ([place]) 01/05/2017  . Polyneuropathy 01/05/2017  . Closed right hip fracture ([place]) 12/25/2016  . Anxiety 11/03/2016  . Dysphagia 07/13/2016  . Anasarca 05/21/2016  . Obesity (BMI 30.0-34.9) 05/21/2016  . Closed TBI (traumatic brain injury) ([place]) 04/21/2016  . Status post insertion of percutaneous endoscopic gastrostomy (PEG) tube ([place]) 04/21/2016  . Facial laceration 04/21/2016  . Cheek wound, right, subsequent encounter 04/21/2016  . Urinary tract infection due to extended-spectrum beta lactamase (ESBL) producing Escherichia coli 04/21/2016  . Hypotension 04/21/2016  . Gout   . Hypertension     CMP     Component Value Date/Time   NA 141 01/11/2017   K 3.6 01/11/2017   CL 106 12/29/2016 0520   CO2 28 12/29/2016 0520   GLUCOSE 94 12/29/2016 0520   BUN 13 01/11/2017   CREATININE 0.4 (A) 01/11/2017   CREATININE 0.52 12/29/2016 0520   CALCIUM 8.3 (L) 12/29/2016 0520   PROT 5.7 (L) 12/26/2016 0557   ALBUMIN 2.9 (L) 12/26/2016 0557   AST 16 12/26/2016 0557   ALT 12 (L) 12/26/2016 0557   ALKPHOS 63 12/26/2016 0557   BILITOT 0.6 12/26/2016 0557   GFRNONAA >60 12/29/2016 0520   GFRAA >60 12/29/2016 0520   Recent Labs    12/26/16 0557 12/28/16 0558 12/29/16 0520 01/11/17  NA 142 138 142 141   K 3.8 4.2 3.5 3.6  CL 106 103 106  --   CO2 30 28 28   --   GLUCOSE 119* 140* 94  --   BUN 14 15 21* 13  CREATININE 0.56 0.58 0.52 0.4*  CALCIUM 8.5* 8.1* 8.3*  --    Recent Labs    12/25/16 1800 12/26/16 0557  AST 21 16  ALT 15 12*  ALKPHOS 69 63  BILITOT 0.7 0.6  PROT 6.0* 5.7*  ALBUMIN 3.2* 2.9*   Recent Labs    12/25/16 1800 12/26/16 0557 12/28/16 0558 12/28/16 2137 12/29/16 0520 01/11/17  WBC 10.6* 10.8* 6.6  --  10.2 7.6  NEUTROABS 8.7*  --   --   --   --   --   HGB 11.5* 10.1* 6.4* 8.4* 8.0* 8.8*  HCT 35.4* 31.4* 18.9* 25.0* 23.8* 26*  MCV 89.6 91.5 88.3  --  89.8  --   PLT 172 176 137*  --  166 284   No results for input(s): CHOL,  LDLCALC, TRIG in the last 8760 hours.  Invalid input(s): HCL No results found for: MICROALBUR Lab Results  Component Value Date   TSH 1.68 08/06/2013   No results found for: HGBA1C No results found for: CHOL, HDL, LDLCALC, LDLDIRECT, TRIG, CHOLHDL  Significant Diagnostic Results in last 30 days:  No results found.  Assessment and Plan  Gout Patient continues without flares; on no medication; continue to monitor  Depression, recurrent (HCC) Reasonably well controlled; continue Lexapro 5 mg by mouth daily  Anxiety Appears stable; continue Xanax 0.25 mg by mouth daily at bedtime    Webb Silversmith D. [name], MD

## 2017-01-23 ENCOUNTER — Encounter: Payer: Self-pay | Admitting: Internal Medicine

## 2017-01-23 ENCOUNTER — Non-Acute Institutional Stay (SKILLED_NURSING_FACILITY): Payer: Medicare Other | Admitting: Internal Medicine

## 2017-01-23 DIAGNOSIS — F419 Anxiety disorder, unspecified: Secondary | ICD-10-CM | POA: Diagnosis not present

## 2017-01-23 DIAGNOSIS — F329 Major depressive disorder, single episode, unspecified: Secondary | ICD-10-CM | POA: Diagnosis not present

## 2017-01-23 DIAGNOSIS — M7989 Other specified soft tissue disorders: Secondary | ICD-10-CM

## 2017-01-23 NOTE — Progress Notes (Signed)
Location:  [facility] Room Number: [place]:  SNF [phone])  [name], MD  [name], [name]  Extended Emergency Contact Information Primary Emergency Contact: [name] Address: [address]          [place], [place] [address] [name] of [place] Phone: [phone] Relation: Son Secondary Emergency Contact: [name],[name]          [place], [place] Relation: Sister    Allergies: Demerol [meperidine]  Chief Complaint  Patient presents with  . Acute Visit    right lower leg swelling    HPI: Patient is 81 y.o. female who nursing asked me to see today for right leg swelling noted for the first time today. Patient admits it is a little bit tender. She really cannot give any other history. Per nursing no fever nausea vomiting chills or other systemic symptoms. Patient does not walk she is wheelchair-bound.  Past Medical History:  Diagnosis Date  . Anasarca 05/21/2016  . Anxiety 11/03/2016  . Closed TBI (traumatic brain injury) ([place]) 04/21/2016  . Dysphagia 07/13/2016  . Gout   . Hypertension   . Obesity (BMI 30.0-34.9) 05/21/2016  . Status post insertion of percutaneous endoscopic gastrostomy (PEG) tube ([place]) 04/21/2016    Past Surgical History:  Procedure Laterality Date  . INTRAMEDULLARY (IM) NAIL INTERTROCHANTERIC Right 12/27/2016   Procedure: INTRAMEDULLARY (IM) NAIL INTERTROCHANTRIC RIGHT HIP;  Surgeon: Paralee Cancel, MD;  Location: [facility];  Service: Orthopedics;  Laterality: Right;  . MASTECTOMY      Allergies as of 01/23/2017      Reactions   Demerol [meperidine] Swelling      Medication List        Accurate as of 01/23/17  4:06 PM. Always use your most recent med list.          acetaminophen 500 MG tablet Commonly known as:  TYLENOL Take 1,000 mg by mouth 3 (three) times daily.   ALPRAZolam 0.25 MG  tablet Commonly known as:  XANAX Take 1 tablet (0.25 mg total) by mouth at bedtime as needed for anxiety. Take one tablet at bedtime for anxiety/agitation   escitalopram 5 MG tablet Commonly known as:  LEXAPRO Take 5 mg by mouth. Take one tablet daily for depression   furosemide 20 MG tablet Commonly known as:  LASIX Take 20 mg by mouth daily.   gabapentin 100 MG capsule Commonly known as:  NEURONTIN Take 200 mg by mouth 3 (three) times daily.   HYDROcodone-acetaminophen 5-325 MG tablet Commonly known as:  NORCO/VICODIN Take by mouth. Take one tablet twice daily for pain. Take one tablet every 6 hours as needed for moderate pain   methocarbamol 500 MG tablet Commonly known as:  ROBAXIN Take 500 mg by mouth. Take 2 tablets three times daily for muscle spasm   SENNA-S PO Take by mouth. Take 2 tablets twice daily as needed for constipation       No orders of the defined types were placed in this encounter.   Immunization History  Administered Date(s) Administered  . Influenza-Unspecified 12/19/2016  . PPD Test 04/29/2016  . Pneumococcal Polysaccharide-23 04/15/2016  . Tdap 03/31/2016    Social History   Tobacco Use  . Smoking status: Never Smoker  . Smokeless tobacco: Never Used  Substance Use Topics  . Alcohol use: No    Review of Systems  DATA OBTAINED: from patient-limited;  nursing-as per history of present illness GENERAL:  no fevers, fatigue, appetite changes SKIN: No itching, rash HEENT: No complaint RESPIRATORY: No cough, wheezing, SOB CARDIAC: No chest pain, palpitations,+ lower extremity edema  GI: No abdominal pain, No N/V/D or constipation, No heartburn or reflux  GU: No dysuria, frequency or urgency, or incontinence  MUSCULOSKELETAL: No unrelieved bone/joint pain NEUROLOGIC: No headache, dizziness  PSYCHIATRIC: No overt anxiety or sadness  Vitals:   01/23/17 1604  BP: 128/76  Pulse: 78  Resp: 20  Temp: 98.1 F (36.7 C)  SpO2: 98%    Body mass index is 30.29 kg/m. Physical Exam  GENERAL APPEARANCE: Alert, conversant, No acute distress  SKIN: No diaphoresis rash HEENT: Unremarkable RESPIRATORY: Breathing is even, unlabored. Lung sounds are clear   CARDIOVASCULAR: Heart RRR no murmurs, rubs or gallops. + peripheral edema with right leg much much more swollen than left leg, skin is very tense and tight, no posterior leg tender to palpation ; mild heat no redness GASTROINTESTINAL: Abdomen is soft, non-tender, not distended w/ normal bowel sounds.  GENITOURINARY: Bladder non tender, not distended  MUSCULOSKELETAL: No abnormal joints or musculature NEUROLOGIC: Cranial nerves 2-12 grossly intact. Moves all extremities PSYCHIATRIC: Mood and affect with prior TBI, no behavioral issues  Patient Active Problem List   Diagnosis Date Noted  . History of total right hip arthroplasty 01/05/2017  . Acute blood loss as cause of postoperative anemia 01/05/2017  . Escherichia coli urinary tract infection 01/05/2017  . Acute encephalopathy 01/05/2017  . Depression, recurrent ([place]) 01/05/2017  . Polyneuropathy 01/05/2017  . Closed right hip fracture ([facility]) 12/25/2016  . Anxiety 11/03/2016  . Dysphagia 07/13/2016  . Anasarca 05/21/2016  . Obesity (BMI 30.0-34.9) 05/21/2016  . Closed TBI (traumatic brain injury) ([facility]) 04/21/2016  . Status post insertion of percutaneous endoscopic gastrostomy (PEG) tube ([place]) 04/21/2016  . Facial laceration 04/21/2016  . Cheek wound, right, subsequent encounter 04/21/2016  . Urinary tract infection due to extended-spectrum beta lactamase (ESBL) producing Escherichia coli 04/21/2016  . Hypotension 04/21/2016  . Gout   . Hypertension     CMP     Component Value Date/Time   NA 141 01/11/2017   K 3.6 01/11/2017   CL 106 12/29/2016 0520   CO2 28 12/29/2016 0520   GLUCOSE 94 12/29/2016 0520   BUN 13 01/11/2017   CREATININE 0.4 (A) 01/11/2017   CREATININE 0.52 12/29/2016 0520   CALCIUM 8.3  (L) 12/29/2016 0520   PROT 5.7 (L) 12/26/2016 0557   ALBUMIN 2.9 (L) 12/26/2016 0557   AST 16 12/26/2016 0557   ALT 12 (L) 12/26/2016 0557   ALKPHOS 63 12/26/2016 0557   BILITOT 0.6 12/26/2016 0557   GFRNONAA >60 12/29/2016 0520   GFRAA >60 12/29/2016 0520   Recent Labs    12/26/16 0557 12/28/16 0558 12/29/16 0520 01/11/17  NA 142 138 142 141  K 3.8 4.2 3.5 3.6  CL 106 103 106  --   CO2 30 28 28   --   GLUCOSE 119* 140* 94  --   BUN 14 15 21* 13  CREATININE 0.56 0.58 0.52 0.4*  CALCIUM 8.5* 8.1* 8.3*  --    Recent Labs    12/25/16 1800 12/26/16 0557  AST 21 16  ALT 15 12*  ALKPHOS 69 63  BILITOT 0.7 0.6  PROT 6.0* 5.7*  ALBUMIN 3.2* 2.9*   Recent Labs    12/25/16 1800 12/26/16 0557 12/28/16 0558 12/28/16 2137 12/29/16 0520 01/11/17  WBC 10.6* 10.8* 6.6  --  10.2 7.6  NEUTROABS 8.7*  --   --   --   --   --   HGB 11.5* 10.1* 6.4* 8.4* 8.0* 8.8*  HCT 35.4* 31.4* 18.9* 25.0* 23.8* 26*  MCV 89.6 91.5 88.3  --  89.8  --   PLT 172 176 137*  --  166 284   No results for input(s): CHOL, LDLCALC, TRIG in the last 8760 hours.  Invalid input(s): HCL No results found for: MICROALBUR Lab Results  Component Value Date   TSH 1.68 08/06/2013   No results found for: HGBA1C No results found for: CHOL, HDL, LDLCALC, LDLDIRECT, TRIG, CHOLHDL  Significant Diagnostic Results in last 30 days:  Dg Shoulder Right  Result Date: 12/25/2016 CLINICAL DATA:  [place] 3 days ago.  Persistent shoulder pain. EXAM: RIGHT SHOULDER - 2+ VIEW COMPARISON:  None. FINDINGS: Anterior dislocation of the humeral head. Severe remote trauma involving the humeral head, neck and proximal shaft with healed fractures. The right lung is grossly clear. Surgical changes involving the right breast are noted. IMPRESSION: Anterior subcoracoid dislocation of the humeral head. Severe remote trauma. Electronically Signed   By: Marijo Sanes M.D.   On: 12/25/2016 19:16   Ct Head Wo Contrast  Result Date:  12/25/2016 CLINICAL DATA:  Pain after fall. EXAM: CT HEAD WITHOUT CONTRAST CT CERVICAL SPINE WITHOUT CONTRAST TECHNIQUE: Multidetector CT imaging of the head and cervical spine was performed following the standard protocol without intravenous contrast. Multiplanar CT image reconstructions of the cervical spine were also generated. COMPARISON:  None. FINDINGS: CT HEAD FINDINGS Brain: Right anterior parietal lobe encephalomalacia with associated ex vacuo dilatation of the anterior horn of the right lateral ventricle. Superficial and central atrophy is noted. No acute intracranial hemorrhage, midline shift or edema. No large vascular territory infarct. No effacement of the basal cisterns or fourth ventricle. Vascular: Atherosclerotic calcifications of both vertebral arteries and carotid siphons. No hyperdense vessels. Skull: Right anterior parietal burr hole. No skull fracture or suspicious osseous lesions. Sinuses/Orbits: Right ethmoid sinus mucosal thickening noted posteriorly. No acute sinusitis. Clear mastoids. Other: Non CT CERVICAL SPINE FINDINGS Alignment: Normal cervical lordosis. Intact atlantodental interval with osteoarthritic joint space narrowing. Intact craniocervical relationship. Skull base and vertebrae: No acute fracture. No primary bone lesion or focal pathologic process. Soft tissues and spinal canal: No prevertebral fluid or swelling. No visible canal hematoma. Disc levels: No focal disc herniation or central canal stenosis. No jumped or perched facets. No significant neural foraminal encroachment. Mild multilevel degenerative joint space narrowing and spurring about the facets. Upper chest: Negative. Other: None IMPRESSION: 1. Right anterior parietal encephalomalacia with ex vacuo dilatation of the anterior horn of the right lateral ventricle. This is likely a result of the reported remote traumatic brain injury the patient's history. There is atrophy with chronic small vessel ischemia. No acute  intracranial abnormality. 2. No acute cervical spine fracture or posttraumatic subluxation. Mild multilevel facet arthropathy. Electronically Signed   By: Ashley Royalty M.D.   On: 12/25/2016 21:10   Ct Cervical Spine Wo Contrast  Result Date: 12/25/2016 CLINICAL DATA:  Pain after fall. EXAM: CT HEAD WITHOUT CONTRAST CT CERVICAL SPINE WITHOUT CONTRAST TECHNIQUE: Multidetector CT imaging of the head and cervical spine was performed following the standard protocol without intravenous contrast. Multiplanar CT image reconstructions of the cervical spine were also generated. COMPARISON:  None. FINDINGS: CT HEAD FINDINGS Brain: Right anterior parietal lobe encephalomalacia with associated ex vacuo dilatation of the anterior horn of the right lateral ventricle.  Superficial and central atrophy is noted. No acute intracranial hemorrhage, midline shift or edema. No large vascular territory infarct. No effacement of the basal cisterns or fourth ventricle. Vascular: Atherosclerotic calcifications of both vertebral arteries and carotid siphons. No hyperdense vessels. Skull: Right anterior parietal burr hole. No skull fracture or suspicious osseous lesions. Sinuses/Orbits: Right ethmoid sinus mucosal thickening noted posteriorly. No acute sinusitis. Clear mastoids. Other: Non CT CERVICAL SPINE FINDINGS Alignment: Normal cervical lordosis. Intact atlantodental interval with osteoarthritic joint space narrowing. Intact craniocervical relationship. Skull base and vertebrae: No acute fracture. No primary bone lesion or focal pathologic process. Soft tissues and spinal canal: No prevertebral fluid or swelling. No visible canal hematoma. Disc levels: No focal disc herniation or central canal stenosis. No jumped or perched facets. No significant neural foraminal encroachment. Mild multilevel degenerative joint space narrowing and spurring about the facets. Upper chest: Negative. Other: None IMPRESSION: 1. Right anterior parietal  encephalomalacia with ex vacuo dilatation of the anterior horn of the right lateral ventricle. This is likely a result of the reported remote traumatic brain injury the patient's history. There is atrophy with chronic small vessel ischemia. No acute intracranial abnormality. 2. No acute cervical spine fracture or posttraumatic subluxation. Mild multilevel facet arthropathy. Electronically Signed   By: Ashley Royalty M.D.   On: 12/25/2016 21:10   Dg C-arm 1-60 Min-no Report  Result Date: 12/27/2016 Fluoroscopy was utilized by the requesting physician.  No radiographic interpretation.   Dg Hip Unilat W Or Wo Pelvis 2-3 Views Right  Result Date: 12/25/2016 CLINICAL DATA:  Fall 3 days ago. Right hip pain. Initial encounter. EXAM: DG HIP (WITH OR WITHOUT PELVIS) 2-3V RIGHT COMPARISON:  None. FINDINGS: Acute intertrochanteric right femur fracture with varus angulation. Both hips are located. Prominent osteopenia. Moderate stool distending the rectum. IMPRESSION: 1. Intertrochanteric right femur fracture with varus angulation. 2. Marked osteopenia. 3. Prominent rectal stool volume. Electronically Signed   By: Monte Fantasia M.D.   On: 12/25/2016 19:16    Assessment and Plan  Right lower extremity swelling-patient is at risk for DVT; have ordered venous Doppler ultrasound right lower extremity    Brennan Karam D. [name], MD

## 2017-02-03 ENCOUNTER — Encounter: Payer: Self-pay | Admitting: Internal Medicine

## 2017-02-03 ENCOUNTER — Non-Acute Institutional Stay (SKILLED_NURSING_FACILITY): Payer: Medicare Other | Admitting: Internal Medicine

## 2017-02-03 DIAGNOSIS — B029 Zoster without complications: Secondary | ICD-10-CM

## 2017-02-03 NOTE — Progress Notes (Signed)
Location:  [facility] Room Number: 211D Place of Service:  SNF (31)  [name], MD  [name], [name]  Extended Emergency Contact Information Primary Emergency Contact: [name] Address: [address]          [place], [place] [address] [name] of [place] Phone: [phone] Relation: Son Secondary Emergency Contact: [name],[name]          [place], [place] Relation: Sister    Allergies: Demerol [meperidine]  Chief Complaint  Patient presents with  . Acute Visit    shingles left arm    HPI: Patient is 81 y.o. female who nursing asked me to see for a rash on her left arm. The rash started yesterday was raised red areas about 6, today vesicles were noted. Patient denies any pain or itching to area, and both patient and nurse deny recent exposures.  Past Medical History:  Diagnosis Date  . Anasarca 05/21/2016  . Anxiety 11/03/2016  . Closed TBI (traumatic brain injury) ([facility]) 04/21/2016  . Dysphagia 07/13/2016  . Gout   . Hypertension   . Obesity (BMI 30.0-34.9) 05/21/2016  . Status post insertion of percutaneous endoscopic gastrostomy (PEG) tube ([facility]) 04/21/2016    Past Surgical History:  Procedure Laterality Date  . INTRAMEDULLARY (IM) NAIL INTERTROCHANTERIC Right 12/27/2016   Procedure: INTRAMEDULLARY (IM) NAIL INTERTROCHANTRIC RIGHT HIP;  Surgeon: Paralee Cancel, MD;  Location: [facility];  Service: Orthopedics;  Laterality: Right;  . MASTECTOMY      Allergies as of 02/03/2017      Reactions   Demerol [meperidine] Swelling      Medication List        Accurate as of 02/03/17  5:31 PM. Always use your most recent med list.          acetaminophen 500 MG tablet Commonly known as:  TYLENOL Take 1,000 mg by mouth 3 (three) times daily.   ALPRAZolam 0.25 MG tablet Commonly known as:  XANAX Take 1 tablet (0.25 mg total) by mouth at  bedtime as needed for anxiety. Take one tablet at bedtime for anxiety/agitation   escitalopram 5 MG tablet Commonly known as:  LEXAPRO Take 5 mg by mouth. Take one tablet daily for depression   furosemide 20 MG tablet Commonly known as:  LASIX Take 20 mg by mouth daily.   gabapentin 100 MG capsule Commonly known as:  NEURONTIN Take 200 mg by mouth 3 (three) times daily.   HYDROcodone-acetaminophen 5-325 MG tablet Commonly known as:  NORCO/VICODIN Take by mouth. Take one tablet twice daily for pain. Take one tablet every 6 hours as needed for moderate pain   methocarbamol 500 MG tablet Commonly known as:  ROBAXIN Take 500 mg by mouth. Take 2 tablets three times daily for muscle spasm   SENNA-S PO Take by mouth. Take 2 tablets twice daily as needed for constipation       No orders of the defined types were placed in this encounter.   Immunization History  Administered Date(s) Administered  . Influenza-Unspecified 12/19/2016  . PPD Test 04/29/2016  . Pneumococcal Polysaccharide-23 04/15/2016  . Tdap 03/31/2016    Social History   Tobacco Use  . Smoking status: Never Smoker  . Smokeless tobacco: Never Used  Substance Use Topics  . Alcohol use: No    Review of Systems  DATA OBTAINED: from patient, nurse GENERAL:  no fevers, fatigue, appetite changes  SKIN:  Rash as per history of present illness HEENT: No complaint RESPIRATORY: No cough, wheezing, SOB CARDIAC: No chest pain, palpitations, lower extremity edema  GI: No abdominal pain, No N/V/D or constipation, No heartburn or reflux  GU: No dysuria, frequency or urgency, or incontinence  MUSCULOSKELETAL: No unrelieved bone/joint pain NEUROLOGIC: No headache, dizziness  PSYCHIATRIC: No overt anxiety or sadness  Vitals:   02/03/17 1727  BP: 140/80  Pulse: 72  Resp: 18  Temp: 98 F (36.7 C)  SpO2: 96%   Body mass index is 29.45 kg/m. Physical Exam  GENERAL APPEARANCE: Alert, conversant, No acute  distress  SKIN: About 6 round raised areas 1-2 cm each with central vesicles lateral arm HEENT: Unremarkable RESPIRATORY: Breathing is even, unlabored. Lung sounds are clear   CARDIOVASCULAR: Heart RRR no murmurs, rubs or gallops. No peripheral edema  GASTROINTESTINAL: Abdomen is soft, non-tender, not distended w/ normal bowel sounds.  GENITOURINARY: Bladder non tender, not distended  MUSCULOSKELETAL: No abnormal joints or musculature NEUROLOGIC: Cranial nerves 2-12 grossly intact. Moves all extremities PSYCHIATRIC: Mood and affect appropriate to situation, no behavioral issues  Patient Active Problem List   Diagnosis Date Noted  . History of total right hip arthroplasty 01/05/2017  . Acute blood loss as cause of postoperative anemia 01/05/2017  . Escherichia coli urinary tract infection 01/05/2017  . Acute encephalopathy 01/05/2017  . Depression, recurrent ([place] [facility]) 01/05/2017  . Polyneuropathy 01/05/2017  . Closed right hip fracture ([facility]) 12/25/2016  . Anxiety 11/03/2016  . Dysphagia 07/13/2016  . Anasarca 05/21/2016  . Obesity (BMI 30.0-34.9) 05/21/2016  . Closed TBI (traumatic brain injury) ([facility]) 04/21/2016  . Status post insertion of percutaneous endoscopic gastrostomy (PEG) tube ([facility]) 04/21/2016  . Facial laceration 04/21/2016  . Cheek wound, right, subsequent encounter 04/21/2016  . Urinary tract infection due to extended-spectrum beta lactamase (ESBL) producing Escherichia coli 04/21/2016  . Hypotension 04/21/2016  . Gout   . Hypertension     CMP     Component Value Date/Time   NA 141 01/11/2017   K 3.6 01/11/2017   CL 106 12/29/2016 0520   CO2 28 12/29/2016 0520   GLUCOSE 94 12/29/2016 0520   BUN 13 01/11/2017   CREATININE 0.4 (A) 01/11/2017   CREATININE 0.52 12/29/2016 0520   CALCIUM 8.3 (L) 12/29/2016 0520   PROT 5.7 (L) 12/26/2016 0557   ALBUMIN 2.9 (L) 12/26/2016 0557   AST 16 12/26/2016 0557   ALT 12 (L) 12/26/2016 0557   ALKPHOS 63 12/26/2016 0557    BILITOT 0.6 12/26/2016 0557   GFRNONAA >60 12/29/2016 0520   GFRAA >60 12/29/2016 0520   Recent Labs    12/26/16 0557 12/28/16 0558 12/29/16 0520 01/11/17  NA 142 138 142 141  K 3.8 4.2 3.5 3.6  CL 106 103 106  --   CO2 30 28 28   --   GLUCOSE 119* 140* 94  --   BUN 14 15 21* 13  CREATININE 0.56 0.58 0.52 0.4*  CALCIUM 8.5* 8.1* 8.3*  --    Recent Labs    12/25/16 1800 12/26/16 0557  AST 21 16  ALT 15 12*  ALKPHOS 69 63  BILITOT 0.7 0.6  PROT 6.0* 5.7*  ALBUMIN 3.2* 2.9*   Recent Labs    12/25/16 1800 12/26/16 0557 12/28/16 0558 12/28/16 2137 12/29/16 0520 01/11/17  WBC 10.6* 10.8* 6.6  --  10.2 7.6  NEUTROABS 8.7*  --   --   --   --   --   HGB  11.5* 10.1* 6.4* 8.4* 8.0* 8.8*  HCT 35.4* 31.4* 18.9* 25.0* 23.8* 26*  MCV 89.6 91.5 88.3  --  89.8  --   PLT 172 176 137*  --  166 284   No results for input(s): CHOL, LDLCALC, TRIG in the last 8760 hours.  Invalid input(s): HCL No results found for: MICROALBUR Lab Results  Component Value Date   TSH 1.68 08/06/2013   No results found for: HGBA1C No results found for: CHOL, HDL, LDLCALC, LDLDIRECT, TRIG, CHOLHDL  Significant Diagnostic Results in last 30 days:  No results found.  Assessment and Plan  Herpes zoster-plan Valtrex 1000 mg every 87 days; we will order pain meds if needed, none are needed now    Webb Silversmith D. [name], MD

## 2017-02-04 ENCOUNTER — Encounter: Payer: Self-pay | Admitting: Internal Medicine

## 2017-02-07 DIAGNOSIS — Z4789 Encounter for other orthopedic aftercare: Secondary | ICD-10-CM | POA: Diagnosis not present

## 2017-02-07 DIAGNOSIS — M79659 Pain in unspecified thigh: Secondary | ICD-10-CM | POA: Diagnosis not present

## 2017-02-07 DIAGNOSIS — M79651 Pain in right thigh: Secondary | ICD-10-CM | POA: Diagnosis not present

## 2017-02-25 ENCOUNTER — Encounter: Payer: Self-pay | Admitting: Internal Medicine

## 2017-02-25 ENCOUNTER — Non-Acute Institutional Stay (SKILLED_NURSING_FACILITY): Payer: Medicare Other | Admitting: Internal Medicine

## 2017-02-25 DIAGNOSIS — B9689 Other specified bacterial agents as the cause of diseases classified elsewhere: Secondary | ICD-10-CM

## 2017-02-25 DIAGNOSIS — D62 Acute posthemorrhagic anemia: Secondary | ICD-10-CM | POA: Diagnosis not present

## 2017-02-25 DIAGNOSIS — G629 Polyneuropathy, unspecified: Secondary | ICD-10-CM

## 2017-02-25 DIAGNOSIS — I10 Essential (primary) hypertension: Secondary | ICD-10-CM

## 2017-02-25 DIAGNOSIS — N760 Acute vaginitis: Secondary | ICD-10-CM | POA: Diagnosis not present

## 2017-02-25 NOTE — Progress Notes (Signed)
Location:  [facility] Room Number: 211D Place of Service:  SNF (31)  [name], MD  [name], [name]  Extended Emergency Contact Information Primary Emergency Contact: [name] Address: [address]          [place], [place] [address] [name] of [place] Phone: [phone] Relation: Son Secondary Emergency Contact: [name],[name]          [place], [place] Relation: Sister    Allergies: Demerol [meperidine]  Chief Complaint  Patient presents with  . Medical Management of Chronic Issues    Routine Visit    HPI: Patient is 82 y.o. female who is being seen for routine issues of polyneuropathy, hypertension, and anemia. Patient is also being seen for complaint of a green vaginal discharge with some odor thin watery, onset for several days. No pelvic pain no abnormal bleeding. No fever, chills, nausea, vomiting, or any other systemic symptom . Past Medical History:  Diagnosis Date  . Anasarca 05/21/2016  . Anxiety 11/03/2016  . Closed TBI (traumatic brain injury) ([place]) 04/21/2016  . Dysphagia 07/13/2016  . Gout   . Hypertension   . Obesity (BMI 30.0-34.9) 05/21/2016  . Status post insertion of percutaneous endoscopic gastrostomy (PEG) tube ([place]) 04/21/2016    Past Surgical History:  Procedure Laterality Date  . INTRAMEDULLARY (IM) NAIL INTERTROCHANTERIC Right 12/27/2016   Procedure: INTRAMEDULLARY (IM) NAIL INTERTROCHANTRIC RIGHT HIP;  Surgeon: Paralee Cancel, MD;  Location: [facility];  Service: Orthopedics;  Laterality: Right;  . MASTECTOMY      Allergies as of 02/25/2017      Reactions   Demerol [meperidine] Swelling      Medication List        Accurate as of 02/25/17 11:59 PM. Always use your most recent med list.          ALPRAZolam 0.25 MG tablet Commonly known as:  XANAX Take 1 tablet (0.25 mg total) by mouth at bedtime as  needed for anxiety. Take one tablet at bedtime for anxiety/agitation   escitalopram 5 MG tablet Commonly known as:  LEXAPRO Take 5 mg by mouth daily. depression   furosemide 20 MG tablet Commonly known as:  LASIX Take 20 mg by mouth daily.   gabapentin 100 MG capsule Commonly known as:  NEURONTIN Take 200 mg by mouth 3 (three) times daily.   methocarbamol 500 MG tablet Commonly known as:  ROBAXIN Take 500 mg by mouth 3 (three) times daily. For  muscle spasm   oxyCODONE 5 MG immediate release tablet Commonly known as:  Oxy IR/ROXICODONE Take 5 mg by mouth every 8 (eight) hours.   sennosides-docusate sodium 8.6-50 MG tablet Commonly known as:  SENOKOT-S Take 2 tablets by mouth 2 (two) times daily as needed for constipation.       No orders of the defined types were placed in this encounter.   Immunization History  Administered Date(s) Administered  . Influenza-Unspecified 12/19/2016  . PPD Test 04/29/2016  . Pneumococcal Polysaccharide-23 04/15/2016  . Tdap 03/31/2016    Social History   Tobacco Use  . Smoking status: Never Smoker  . Smokeless tobacco: Never Used  Substance Use Topics  . Alcohol use: No    Review of Systems  DATA OBTAINED: from patient-limited, secondary to TBI; nursing-no acute conce GENERAL:  no fevers, fatigue, appetite changes SKIN: No itching, rash HEENT: No complaint RESPIRATORY: No cough, wheezing, SOB CARDIAC:  No chest pain, palpitations, lower extremity edema  GI: No abdominal pain, No N/V/D or constipation, No heartburn or reflux  GU: No dysuria, frequency or urgency, or incontinence  MUSCULOSKELETAL: No unrelieved bone/joint pain NEUROLOGIC: No headache, dizziness  PSYCHIATRIC: No overt anxiety or sadness  Vitals:   02/25/17 1512  BP: 138/75  Pulse: 80  Resp: 20  Temp: 99.5 F (37.5 C)  SpO2: 98%   Body mass index is 29.95 kg/m. Physical Exam  GENERAL APPEARANCE: Alert, conversant, No acute distress  SKIN: No  diaphoresis rash HEENT: Unremarkable RESPIRATORY: Breathing is even, unlabored. Lung sounds are clear   CARDIOVASCULAR: Heart RRR no murmurs, rubs or gallops. No peripheral edema  GASTROINTESTINAL: Abdomen is soft, non-tender, not distended w/ normal bowel sounds.  GENITOURINARY: Bladder non tender, not distended  MUSCULOSKELETAL: No abnormal joints or musculature NEUROLOGIC: Cranial nerves 2-12 grossly intact. Moves all extremities PSYCHIATRIC: Mood and affect at baseline status post TBI, no behavioral issues  Patient Active Problem List   Diagnosis Date Noted  . History of total right hip arthroplasty 01/05/2017  . Acute blood loss as cause of postoperative anemia 01/05/2017  . Escherichia coli urinary tract infection 01/05/2017  . Acute encephalopathy 01/05/2017  . Depression, recurrent ([place]) 01/05/2017  . Polyneuropathy 01/05/2017  . Closed right hip fracture ([place]) 12/25/2016  . Anxiety 11/03/2016  . Dysphagia 07/13/2016  . Anasarca 05/21/2016  . Obesity (BMI 30.0-34.9) 05/21/2016  . Closed TBI (traumatic brain injury) ([place]) 04/21/2016  . Status post insertion of percutaneous endoscopic gastrostomy (PEG) tube ([place]) 04/21/2016  . Facial laceration 04/21/2016  . Cheek wound, right, subsequent encounter 04/21/2016  . Urinary tract infection due to extended-spectrum beta lactamase (ESBL) producing Escherichia coli 04/21/2016  . Hypotension 04/21/2016  . Gout   . Hypertension     CMP     Component Value Date/Time   NA 141 01/11/2017   K 3.6 01/11/2017   CL 106 12/29/2016 0520   CO2 28 12/29/2016 0520   GLUCOSE 94 12/29/2016 0520   BUN 13 01/11/2017   CREATININE 0.4 (A) 01/11/2017   CREATININE 0.52 12/29/2016 0520   CALCIUM 8.3 (L) 12/29/2016 0520   PROT 5.7 (L) 12/26/2016 0557   ALBUMIN 2.9 (L) 12/26/2016 0557   AST 16 12/26/2016 0557   ALT 12 (L) 12/26/2016 0557   ALKPHOS 63 12/26/2016 0557   BILITOT 0.6 12/26/2016 0557   GFRNONAA >60 12/29/2016 0520   GFRAA >60  12/29/2016 0520   Recent Labs    12/26/16 0557 12/28/16 0558 12/29/16 0520 01/11/17  NA 142 138 142 141  K 3.8 4.2 3.5 3.6  CL 106 103 106  --   CO2 30 28 28   --   GLUCOSE 119* 140* 94  --   BUN 14 15 21* 13  CREATININE 0.56 0.58 0.52 0.4*  CALCIUM 8.5* 8.1* 8.3*  --    Recent Labs    12/25/16 1800 12/26/16 0557  AST 21 16  ALT 15 12*  ALKPHOS 69 63  BILITOT 0.7 0.6  PROT 6.0* 5.7*  ALBUMIN 3.2* 2.9*   Recent Labs    12/25/16 1800 12/26/16 0557 12/28/16 0558 12/28/16 2137 12/29/16 0520 01/11/17  WBC 10.6* 10.8* 6.6  --  10.2 7.6  NEUTROABS 8.7*  --   --   --   --   --   HGB 11.5* 10.1* 6.4* 8.4* 8.0* 8.8*  HCT 35.4* 31.4* 18.9* 25.0* 23.8* 26*  MCV 89.6 91.5 88.3  --  89.8  --  PLT 172 176 137*  --  166 284   No results for input(s): CHOL, LDLCALC, TRIG in the last 8760 hours.  Invalid input(s): HCL No results found for: [facility] Lab Results  Component Value Date   TSH 1.68 08/06/2013   No results found for: HGBA1C No results found for: CHOL, HDL, LDLCALC, LDLDIRECT, TRIG, CHOLHDL  Significant Diagnostic Results in last 30 days:  No results found.  Assessment and Plan  Polyneuropathy Chronic and stable; continue Neurontin 200 mg 3 times a day  Hypertension Controlled; continue Lasix 20 mg by mouth daily  Acute blood loss as cause of postoperative anemia Most recent hemoglobin 8.8 which is improved from prior of 8.0; patient getting supplements and her tube feeds, will continue same; we'll monitor intervals  Bacterial vaginosis-I didn't have the ability to check the vaginal discharge in the skilled nursing facility but greenish discharge with an odor and is most commonly bacterial vaginosis so will go ahead and treat with Flagyl 500 mg 3 times a day 7 days; we'll send her to [facility]   Ramsay Bognar D. [name], MD

## 2017-02-26 ENCOUNTER — Encounter: Payer: Self-pay | Admitting: Internal Medicine

## 2017-02-26 NOTE — Assessment & Plan Note (Signed)
Reasonably well controlled; continue Lexapro 5 mg by mouth daily

## 2017-02-26 NOTE — Assessment & Plan Note (Signed)
Patient continues without flares; on no medication; continue to monitor

## 2017-02-26 NOTE — Assessment & Plan Note (Signed)
Appears stable; continue Xanax 0.25 mg by mouth daily at bedtime

## 2017-02-27 DIAGNOSIS — F329 Major depressive disorder, single episode, unspecified: Secondary | ICD-10-CM | POA: Diagnosis not present

## 2017-02-27 DIAGNOSIS — F419 Anxiety disorder, unspecified: Secondary | ICD-10-CM | POA: Diagnosis not present

## 2017-02-27 DIAGNOSIS — K9429 Other complications of gastrostomy: Secondary | ICD-10-CM | POA: Diagnosis not present

## 2017-03-06 ENCOUNTER — Encounter: Payer: Self-pay | Admitting: Internal Medicine

## 2017-03-06 NOTE — Assessment & Plan Note (Signed)
Most recent hemoglobin 8.8 which is improved from prior of 8.0; patient getting supplements and her tube feeds, will continue same; we'll monitor intervals

## 2017-03-06 NOTE — Assessment & Plan Note (Signed)
Controlled; continue Lasix 20 mg by mouth daily

## 2017-03-06 NOTE — Assessment & Plan Note (Signed)
Chronic and stable; continue Neurontin 200 mg 3 times a day

## 2017-03-25 DIAGNOSIS — F419 Anxiety disorder, unspecified: Secondary | ICD-10-CM | POA: Diagnosis not present

## 2017-03-25 DIAGNOSIS — F325 Major depressive disorder, single episode, in full remission: Secondary | ICD-10-CM | POA: Diagnosis not present

## 2017-03-27 DIAGNOSIS — S72101D Unspecified trochanteric fracture of right femur, subsequent encounter for closed fracture with routine healing: Secondary | ICD-10-CM | POA: Diagnosis not present

## 2017-03-27 DIAGNOSIS — Z9181 History of falling: Secondary | ICD-10-CM | POA: Diagnosis not present

## 2017-03-27 DIAGNOSIS — M6281 Muscle weakness (generalized): Secondary | ICD-10-CM | POA: Diagnosis not present

## 2017-03-28 ENCOUNTER — Non-Acute Institutional Stay (SKILLED_NURSING_FACILITY): Payer: Medicare Other | Admitting: Internal Medicine

## 2017-03-28 DIAGNOSIS — S069X6D Unspecified intracranial injury with loss of consciousness greater than 24 hours without return to pre-existing conscious level with patient surviving, subsequent encounter: Secondary | ICD-10-CM

## 2017-03-28 DIAGNOSIS — Z9181 History of falling: Secondary | ICD-10-CM | POA: Diagnosis not present

## 2017-03-28 DIAGNOSIS — R601 Generalized edema: Secondary | ICD-10-CM | POA: Diagnosis not present

## 2017-03-28 DIAGNOSIS — R1310 Dysphagia, unspecified: Secondary | ICD-10-CM

## 2017-03-28 DIAGNOSIS — M6281 Muscle weakness (generalized): Secondary | ICD-10-CM | POA: Diagnosis not present

## 2017-03-28 DIAGNOSIS — S72101D Unspecified trochanteric fracture of right femur, subsequent encounter for closed fracture with routine healing: Secondary | ICD-10-CM | POA: Diagnosis not present

## 2017-03-31 ENCOUNTER — Encounter: Payer: Self-pay | Admitting: Internal Medicine

## 2017-03-31 DIAGNOSIS — Z9181 History of falling: Secondary | ICD-10-CM | POA: Diagnosis not present

## 2017-03-31 DIAGNOSIS — S72101D Unspecified trochanteric fracture of right femur, subsequent encounter for closed fracture with routine healing: Secondary | ICD-10-CM | POA: Diagnosis not present

## 2017-03-31 DIAGNOSIS — M6281 Muscle weakness (generalized): Secondary | ICD-10-CM | POA: Diagnosis not present

## 2017-03-31 NOTE — Progress Notes (Signed)
Location:  [facility] Room Number: 211D Place of Service:  SNF (31)  [name], MD  [name], [name]  Extended Emergency Contact Information Primary Emergency Contact: [name] Address: [address]          [place],  [address] [name] of [place] Phone: [phone] Relation: Son Secondary Emergency Contact: [name],[name]          [place], [place] Relation: Sister    Allergies: Demerol [meperidine]  Chief Complaint  Patient presents with  . Medical Management of Chronic Issues    Routine Visit    HPI: Patient is 82 y.o. female who is being seen for routine issues of dysphasia, traumatic brain injury, and anasarca.  Past Medical History:  Diagnosis Date  . Anasarca 05/21/2016  . Anxiety 11/03/2016  . Closed TBI (traumatic brain injury) ([place]) 04/21/2016  . Dysphagia 07/13/2016  . Gout   . Hypertension   . Obesity (BMI 30.0-34.9) 05/21/2016  . Status post insertion of percutaneous endoscopic gastrostomy (PEG) tube ([place]) 04/21/2016    Past Surgical History:  Procedure Laterality Date  . INTRAMEDULLARY (IM) NAIL INTERTROCHANTERIC Right 12/27/2016   Procedure: INTRAMEDULLARY (IM) NAIL INTERTROCHANTRIC RIGHT HIP;  Surgeon: Paralee Cancel, MD;  Location: [facility];  Service: Orthopedics;  Laterality: Right;  . MASTECTOMY      Allergies as of 03/28/2017      Reactions   Demerol [meperidine] Swelling      Medication List        Accurate as of 03/28/17 11:59 PM. Always use your most recent med list.          ALPRAZolam 0.25 MG tablet Commonly known as:  XANAX Take 1 tablet (0.25 mg total) by mouth at bedtime as needed for anxiety. Take one tablet at bedtime for anxiety/agitation   furosemide 20 MG tablet Commonly known as:  LASIX Take 20 mg by mouth daily.   gabapentin 100 MG capsule Commonly known as:  NEURONTIN Take 200  mg by mouth 3 (three) times daily.   methocarbamol 500 MG tablet Commonly known as:  ROBAXIN Take 500 mg by mouth 3 (three) times daily. For  muscle spasm   oxyCODONE 5 MG immediate release tablet Commonly known as:  Oxy IR/ROXICODONE Take 5 mg by mouth every 8 (eight) hours.   sennosides-docusate sodium 8.6-50 MG tablet Commonly known as:  SENOKOT-S Take 2 tablets by mouth 2 (two) times daily as needed for constipation.       No orders of the defined types were placed in this encounter.   Immunization History  Administered Date(s) Administered  . Influenza-Unspecified 12/19/2016  . PPD Test 04/29/2016  . Pneumococcal Polysaccharide-23 04/15/2016  . Tdap 03/31/2016    Social History   Tobacco Use  . Smoking status: Never Smoker  . Smokeless tobacco: Never Used  Substance Use Topics  . Alcohol use: No    Review of Systems  DATA OBTAINED: from nurse GENERAL:  no fevers, fatigue, appetite changes SKIN: No itching, rash HEENT: No complaint RESPIRATORY: No cough, wheezing, SOB CARDIAC: No chest pain, palpitations, lower extremity edema  GI: No abdominal pain, No N/V/D or constipation, No heartburn or reflux  GU: No dysuria, frequency or urgency, or incontinence  MUSCULOSKELETAL: No unrelieved bone/joint pain NEUROLOGIC: No headache, dizziness  PSYCHIATRIC: No overt anxiety or sadness  Vitals:   03/28/17 1605  BP: 122/68  Pulse: 75  Resp: 18  Temp: (!) 97.3 F (36.3 C)  SpO2: 92%   Body mass index is 28.82 kg/m. Physical Exam  GENERAL APPEARANCE: Alert, minimally conversant, No acute distress  SKIN: No diaphoresis rash HEENT: Unremarkable except for deep scar face RESPIRATORY: Breathing is even, unlabored. Lung sounds are clear   CARDIOVASCULAR: Heart RRR no murmurs, rubs or gallops. No peripheral edema  GASTROINTESTINAL: Abdomen is soft, non-tender, not distended w/ normal bowel sounds.  GENITOURINARY: Bladder non tender, not distended    MUSCULOSKELETAL: No abnormal joints or musculature NEUROLOGIC: Cranial nerves 2-12 grossly intact. Moves all extremities PSYCHIATRIC: Mood and affect post TBI, no behavioral issues  Patient Active Problem List   Diagnosis Date Noted  . History of total right hip arthroplasty 01/05/2017  . Acute blood loss as cause of postoperative anemia 01/05/2017  . Escherichia coli urinary tract infection 01/05/2017  . Acute encephalopathy 01/05/2017  . Depression, recurrent ([place]) 01/05/2017  . Polyneuropathy 01/05/2017  . Closed right hip fracture ([place]) 12/25/2016  . Anxiety 11/03/2016  . Dysphagia 07/13/2016  . Anasarca 05/21/2016  . Obesity (BMI 30.0-34.9) 05/21/2016  . Closed TBI (traumatic brain injury) ([place]) 04/21/2016  . Status post insertion of percutaneous endoscopic gastrostomy (PEG) tube ([place]) 04/21/2016  . Facial laceration 04/21/2016  . Cheek wound, right, subsequent encounter 04/21/2016  . Urinary tract infection due to extended-spectrum beta lactamase (ESBL) producing Escherichia coli 04/21/2016  . Hypotension 04/21/2016  . Gout   . Hypertension     CMP     Component Value Date/Time   NA 141 01/11/2017   K 3.6 01/11/2017   CL 106 12/29/2016 0520   CO2 28 12/29/2016 0520   GLUCOSE 94 12/29/2016 0520   BUN 13 01/11/2017   CREATININE 0.4 (A) 01/11/2017   CREATININE 0.52 12/29/2016 0520   CALCIUM 8.3 (L) 12/29/2016 0520   PROT 5.7 (L) 12/26/2016 0557   ALBUMIN 2.9 (L) 12/26/2016 0557   AST 16 12/26/2016 0557   ALT 12 (L) 12/26/2016 0557   ALKPHOS 63 12/26/2016 0557   BILITOT 0.6 12/26/2016 0557   GFRNONAA >60 12/29/2016 0520   GFRAA >60 12/29/2016 0520   Recent Labs    12/26/16 0557 12/28/16 0558 12/29/16 0520 01/11/17  NA 142 138 142 141  K 3.8 4.2 3.5 3.6  CL 106 103 106  --   CO2 30 28 28   --   GLUCOSE 119* 140* 94  --   BUN 14 15 21* 13  CREATININE 0.56 0.58 0.52 0.4*  CALCIUM 8.5* 8.1* 8.3*  --    Recent Labs    12/25/16 1800 12/26/16 0557  AST 21  16  ALT 15 12*  ALKPHOS 69 63  BILITOT 0.7 0.6  PROT 6.0* 5.7*  ALBUMIN 3.2* 2.9*   Recent Labs    12/25/16 1800 12/26/16 0557 12/28/16 0558 12/28/16 2137 12/29/16 0520 01/11/17  WBC 10.6* 10.8* 6.6  --  10.2 7.6  NEUTROABS 8.7*  --   --   --   --   --   HGB 11.5* 10.1* 6.4* 8.4* 8.0* 8.8*  HCT 35.4* 31.4* 18.9* 25.0* 23.8* 26*  MCV 89.6 91.5 88.3  --  89.8  --   PLT 172 176 137*  --  166 284   No results for input(s): CHOL, LDLCALC, TRIG in the last 8760 hours.  Invalid input(s): HCL No results found for: [facility] Lab Results  Component Value Date   TSH 1.68 08/06/2013   No results found for: HGBA1C No results found  for: CHOL, HDL, LDLCALC, LDLDIRECT, TRIG, CHOLHDL  Significant Diagnostic Results in last 30 days:  No results found.  Assessment and Plan  Dysphagia Patient is eating enough of her diet for tube feedings to have stopped; will continue to monitor  Closed TBI (traumatic brain injury) [facility]) Patient has recovered an incredible amount from her arrival, however she will still have moderately severe intellectual impairment; continue supportive care  Anasarca Continues without edema; continue Lasix 20 mg by mouth daily     Autumn Bryant D. [name], MD

## 2017-04-01 DIAGNOSIS — S72101D Unspecified trochanteric fracture of right femur, subsequent encounter for closed fracture with routine healing: Secondary | ICD-10-CM | POA: Diagnosis not present

## 2017-04-01 DIAGNOSIS — M6281 Muscle weakness (generalized): Secondary | ICD-10-CM | POA: Diagnosis not present

## 2017-04-01 DIAGNOSIS — Z9181 History of falling: Secondary | ICD-10-CM | POA: Diagnosis not present

## 2017-04-02 DIAGNOSIS — Z9181 History of falling: Secondary | ICD-10-CM | POA: Diagnosis not present

## 2017-04-02 DIAGNOSIS — M6281 Muscle weakness (generalized): Secondary | ICD-10-CM | POA: Diagnosis not present

## 2017-04-02 DIAGNOSIS — S72101D Unspecified trochanteric fracture of right femur, subsequent encounter for closed fracture with routine healing: Secondary | ICD-10-CM | POA: Diagnosis not present

## 2017-04-03 ENCOUNTER — Encounter: Payer: Self-pay | Admitting: Internal Medicine

## 2017-04-03 DIAGNOSIS — Z9181 History of falling: Secondary | ICD-10-CM | POA: Diagnosis not present

## 2017-04-03 DIAGNOSIS — M6281 Muscle weakness (generalized): Secondary | ICD-10-CM | POA: Diagnosis not present

## 2017-04-03 DIAGNOSIS — S72101D Unspecified trochanteric fracture of right femur, subsequent encounter for closed fracture with routine healing: Secondary | ICD-10-CM | POA: Diagnosis not present

## 2017-04-03 NOTE — Assessment & Plan Note (Signed)
Patient has recovered an incredible amount from her arrival, however she will still have moderately severe intellectual impairment; continue supportive care

## 2017-04-03 NOTE — Assessment & Plan Note (Signed)
Continues without edema; continue Lasix 20 mg by mouth daily

## 2017-04-03 NOTE — Assessment & Plan Note (Signed)
Patient is eating enough of her diet for tube feedings to have stopped; will continue to monitor

## 2017-04-04 DIAGNOSIS — M6281 Muscle weakness (generalized): Secondary | ICD-10-CM | POA: Diagnosis not present

## 2017-04-04 DIAGNOSIS — S72101D Unspecified trochanteric fracture of right femur, subsequent encounter for closed fracture with routine healing: Secondary | ICD-10-CM | POA: Diagnosis not present

## 2017-04-04 DIAGNOSIS — Z9181 History of falling: Secondary | ICD-10-CM | POA: Diagnosis not present

## 2017-04-05 DIAGNOSIS — Z9181 History of falling: Secondary | ICD-10-CM | POA: Diagnosis not present

## 2017-04-05 DIAGNOSIS — S72101D Unspecified trochanteric fracture of right femur, subsequent encounter for closed fracture with routine healing: Secondary | ICD-10-CM | POA: Diagnosis not present

## 2017-04-05 DIAGNOSIS — M6281 Muscle weakness (generalized): Secondary | ICD-10-CM | POA: Diagnosis not present

## 2017-04-07 DIAGNOSIS — Z9181 History of falling: Secondary | ICD-10-CM | POA: Diagnosis not present

## 2017-04-07 DIAGNOSIS — M6281 Muscle weakness (generalized): Secondary | ICD-10-CM | POA: Diagnosis not present

## 2017-04-07 DIAGNOSIS — S72101D Unspecified trochanteric fracture of right femur, subsequent encounter for closed fracture with routine healing: Secondary | ICD-10-CM | POA: Diagnosis not present

## 2017-04-09 DIAGNOSIS — Z9181 History of falling: Secondary | ICD-10-CM | POA: Diagnosis not present

## 2017-04-09 DIAGNOSIS — M6281 Muscle weakness (generalized): Secondary | ICD-10-CM | POA: Diagnosis not present

## 2017-04-09 DIAGNOSIS — S72101D Unspecified trochanteric fracture of right femur, subsequent encounter for closed fracture with routine healing: Secondary | ICD-10-CM | POA: Diagnosis not present

## 2017-04-10 DIAGNOSIS — S72101D Unspecified trochanteric fracture of right femur, subsequent encounter for closed fracture with routine healing: Secondary | ICD-10-CM | POA: Diagnosis not present

## 2017-04-10 DIAGNOSIS — Z9181 History of falling: Secondary | ICD-10-CM | POA: Diagnosis not present

## 2017-04-10 DIAGNOSIS — M6281 Muscle weakness (generalized): Secondary | ICD-10-CM | POA: Diagnosis not present

## 2017-04-11 DIAGNOSIS — Z9181 History of falling: Secondary | ICD-10-CM | POA: Diagnosis not present

## 2017-04-11 DIAGNOSIS — M6281 Muscle weakness (generalized): Secondary | ICD-10-CM | POA: Diagnosis not present

## 2017-04-11 DIAGNOSIS — S72101D Unspecified trochanteric fracture of right femur, subsequent encounter for closed fracture with routine healing: Secondary | ICD-10-CM | POA: Diagnosis not present

## 2017-04-21 ENCOUNTER — Non-Acute Institutional Stay (SKILLED_NURSING_FACILITY): Payer: Medicare Other | Admitting: Internal Medicine

## 2017-04-21 ENCOUNTER — Encounter: Payer: Self-pay | Admitting: Internal Medicine

## 2017-04-21 DIAGNOSIS — F339 Major depressive disorder, recurrent, unspecified: Secondary | ICD-10-CM

## 2017-04-21 DIAGNOSIS — F329 Major depressive disorder, single episode, unspecified: Secondary | ICD-10-CM | POA: Diagnosis not present

## 2017-04-21 DIAGNOSIS — E669 Obesity, unspecified: Secondary | ICD-10-CM

## 2017-04-21 DIAGNOSIS — F325 Major depressive disorder, single episode, in full remission: Secondary | ICD-10-CM | POA: Diagnosis not present

## 2017-04-21 DIAGNOSIS — F419 Anxiety disorder, unspecified: Secondary | ICD-10-CM | POA: Diagnosis not present

## 2017-04-21 DIAGNOSIS — L89612 Pressure ulcer of right heel, stage 2: Secondary | ICD-10-CM | POA: Diagnosis not present

## 2017-04-21 NOTE — Progress Notes (Signed)
Location:  [facility] Room Number: 211D Place of Service:  SNF (31)  [name], MD  [name], [name]  Extended Emergency Contact Information Primary Emergency Contact: [name] Address: [address]          [place], [place] [address] [name] of [place] Phone: [phone] Relation: Son Secondary Emergency Contact: [name],[name]          [place], [place] Relation: Sister    Allergies: Demerol [meperidine]  Chief Complaint  Patient presents with  . Medical Management of Chronic Issues    Routine Visit    HPI: Patient is 82 y.o. female who being seen for routine issues of obesity, anxiety, and depression.  Past Medical History:  Diagnosis Date  . Anasarca 05/21/2016  . Anxiety 11/03/2016  . Closed TBI (traumatic brain injury) ([place]) 04/21/2016  . Dysphagia 07/13/2016  . Gout   . Hypertension   . Obesity (BMI 30.0-34.9) 05/21/2016  . Status post insertion of percutaneous endoscopic gastrostomy (PEG) tube ([place]) 04/21/2016    Past Surgical History:  Procedure Laterality Date  . INTRAMEDULLARY (IM) NAIL INTERTROCHANTERIC Right 12/27/2016   Procedure: INTRAMEDULLARY (IM) NAIL INTERTROCHANTRIC RIGHT HIP;  Surgeon: Paralee Cancel, MD;  Location: [facility];  Service: Orthopedics;  Laterality: Right;  . MASTECTOMY      Allergies as of 04/21/2017      Reactions   Demerol [meperidine] Swelling      Medication List        Accurate as of 04/21/17 11:59 PM. Always use your most recent med list.          ALPRAZolam 0.25 MG tablet Commonly known as:  XANAX Take 0.25 mg by mouth at bedtime.   ENSURE Take 237 mLs by mouth 3 (three) times daily between meals.   furosemide 20 MG tablet Commonly known as:  LASIX Take 20 mg by mouth daily.   gabapentin 300 MG capsule Commonly known as:  NEURONTIN Take 300 mg by mouth 3 (three) times  daily.   methocarbamol 500 MG tablet Commonly known as:  ROBAXIN Take 1,000 mg by mouth 3 (three) times daily. For  muscle spasm   multivitamin with minerals tablet Take 1 tablet by mouth daily.   oxyCODONE 5 MG immediate release tablet Commonly known as:  Oxy IR/ROXICODONE Take 5 mg by mouth every 8 (eight) hours.   sennosides-docusate sodium 8.6-50 MG tablet Commonly known as:  SENOKOT-S Take 2 tablets by mouth 2 (two) times daily as needed for constipation.       No orders of the defined types were placed in this encounter.   Immunization History  Administered Date(s) Administered  . Influenza-Unspecified 12/19/2016  . PPD Test 04/29/2016  . Pneumococcal Polysaccharide-23 04/15/2016  . Tdap 03/31/2016    Social History   Tobacco Use  . Smoking status: Never Smoker  . Smokeless tobacco: Never Used  Substance Use Topics  . Alcohol use: No    Review of Systems  DATA OBTAINED: from patient-limited; nursing-no acute concerns GENERAL:  no fevers, fatigue, appetite changes SKIN: No itching, rash HEENT: No complaint RESPIRATORY: No cough, wheezing, SOB CARDIAC: No chest pain, palpitations, lower extremity edema  GI: No abdominal pain, No N/V/D or constipation, No heartburn or reflux  GU: No dysuria, frequency or urgency, or incontinence  MUSCULOSKELETAL: No unrelieved bone/joint pain NEUROLOGIC: No headache, dizziness  PSYCHIATRIC: No overt anxiety or sadness  Vitals:   04/21/17 1355  BP: 123/75  Pulse: 91  Resp: 20  Temp: 97.6 F (36.4 C)  SpO2: 97%   Body mass index is 27.76 kg/m. Physical Exam  GENERAL APPEARANCE: Alert, conversant, No acute distress  SKIN: No diaphoresis rash HEENT: Unremarkable RESPIRATORY: Breathing is even, unlabored. Lung sounds are clear   CARDIOVASCULAR: Heart regular, 2/6 murmur no rubs or gallops.  peripheral edema  GASTROINTESTINAL: Abdomen is soft, non-tender, not distended w/ normal bowel sounds.  GENITOURINARY:  Bladder non tender, not distended  MUSCULOSKELETAL: No abnormal joints or musculature NEUROLOGIC: Cranial nerves 2-12 grossly intact. Moves all extremities PSYCHIATRIC: Mood and affect appropriate to situation some mental delay, no behavioral issues  Patient Active Problem List   Diagnosis Date Noted  . History of total right hip arthroplasty 01/05/2017  . Acute blood loss as cause of postoperative anemia 01/05/2017  . Escherichia coli urinary tract infection 01/05/2017  . Acute encephalopathy 01/05/2017  . Depression, recurrent ([place]) 01/05/2017  . Polyneuropathy 01/05/2017  . Closed right hip fracture ([place]) 12/25/2016  . Anxiety 11/03/2016  . Dysphagia 07/13/2016  . Anasarca 05/21/2016  . Obesity (BMI 30.0-34.9) 05/21/2016  . Closed TBI (traumatic brain injury) ([place]) 04/21/2016  . Status post insertion of percutaneous endoscopic gastrostomy (PEG) tube ([place]) 04/21/2016  . Facial laceration 04/21/2016  . Cheek wound, right, subsequent encounter 04/21/2016  . Urinary tract infection due to extended-spectrum beta lactamase (ESBL) producing Escherichia coli 04/21/2016  . Hypotension 04/21/2016  . Gout   . Hypertension     CMP     Component Value Date/Time   NA 141 01/11/2017   K 3.6 01/11/2017   CL 106 12/29/2016 0520   CO2 28 12/29/2016 0520   GLUCOSE 94 12/29/2016 0520   BUN 13 01/11/2017   CREATININE 0.4 (A) 01/11/2017   CREATININE 0.52 12/29/2016 0520   CALCIUM 8.3 (L) 12/29/2016 0520   PROT 5.7 (L) 12/26/2016 0557   ALBUMIN 2.9 (L) 12/26/2016 0557   AST 16 12/26/2016 0557   ALT 12 (L) 12/26/2016 0557   ALKPHOS 63 12/26/2016 0557   BILITOT 0.6 12/26/2016 0557   GFRNONAA >60 12/29/2016 0520   GFRAA >60 12/29/2016 0520   Recent Labs    12/26/16 0557 12/28/16 0558 12/29/16 0520 01/11/17  NA 142 138 142 141  K 3.8 4.2 3.5 3.6  CL 106 103 106  --   CO2 30 28 28   --   GLUCOSE 119* 140* 94  --   BUN 14 15 21* 13  CREATININE 0.56 0.58 0.52 0.4*  CALCIUM 8.5* 8.1*  8.3*  --    Recent Labs    12/25/16 1800 12/26/16 0557  AST 21 16  ALT 15 12*  ALKPHOS 69 63  BILITOT 0.7 0.6  PROT 6.0* 5.7*  ALBUMIN 3.2* 2.9*   Recent Labs    12/25/16 1800 12/26/16 0557 12/28/16 0558 12/28/16 2137 12/29/16 0520 01/11/17  WBC 10.6* 10.8* 6.6  --  10.2 7.6  NEUTROABS 8.7*  --   --   --   --   --   HGB 11.5* 10.1* 6.4* 8.4* 8.0* 8.8*  HCT 35.4* 31.4* 18.9* 25.0* 23.8* 26*  MCV 89.6 91.5 88.3  --  89.8  --   PLT 172 176 137*  --  166 284   No results for input(s): CHOL, LDLCALC, TRIG in the last 8760 hours.  Invalid input(s): HCL No results found for: [facility] Lab Results  Component Value Date   TSH 1.68 08/06/2013  No results found for: HGBA1C No results found for: CHOL, HDL, LDLCALC, LDLDIRECT, TRIG, CHOLHDL  Significant Diagnostic Results in last 30 days:  No results found.  Assessment and Plan  Obesity (BMI 30.0-34.9) Patient has had some weight loss, it has been desired; continue to monitor  Anxiety Has remained stable; continue Xanax 0.25 mg nightly  Depression, recurrent ([place]) Recently well controlled off all meds; continue to monitor     Autumn Bryant. Sheral Apley, MD

## 2017-04-26 ENCOUNTER — Encounter: Payer: Self-pay | Admitting: Internal Medicine

## 2017-04-26 NOTE — Assessment & Plan Note (Signed)
Patient has had some weight loss, it has been desired; continue to monitor

## 2017-04-26 NOTE — Assessment & Plan Note (Signed)
Recently well controlled off all meds; continue to monitor

## 2017-04-26 NOTE — Assessment & Plan Note (Signed)
Has remained stable; continue Xanax 0.25 mg nightly

## 2017-04-28 DIAGNOSIS — L89612 Pressure ulcer of right heel, stage 2: Secondary | ICD-10-CM | POA: Diagnosis not present

## 2017-05-05 DIAGNOSIS — L89612 Pressure ulcer of right heel, stage 2: Secondary | ICD-10-CM | POA: Diagnosis not present

## 2017-05-12 DIAGNOSIS — L89612 Pressure ulcer of right heel, stage 2: Secondary | ICD-10-CM | POA: Diagnosis not present

## 2017-05-12 DIAGNOSIS — L89150 Pressure ulcer of sacral region, unstageable: Secondary | ICD-10-CM | POA: Diagnosis not present

## 2017-05-19 DIAGNOSIS — L89612 Pressure ulcer of right heel, stage 2: Secondary | ICD-10-CM | POA: Diagnosis not present

## 2017-05-19 DIAGNOSIS — L89150 Pressure ulcer of sacral region, unstageable: Secondary | ICD-10-CM | POA: Diagnosis not present

## 2017-05-22 DIAGNOSIS — F325 Major depressive disorder, single episode, in full remission: Secondary | ICD-10-CM | POA: Diagnosis not present

## 2017-05-22 DIAGNOSIS — F419 Anxiety disorder, unspecified: Secondary | ICD-10-CM | POA: Diagnosis not present

## 2017-05-26 DIAGNOSIS — L89612 Pressure ulcer of right heel, stage 2: Secondary | ICD-10-CM | POA: Diagnosis not present

## 2017-05-26 DIAGNOSIS — L89150 Pressure ulcer of sacral region, unstageable: Secondary | ICD-10-CM | POA: Diagnosis not present

## 2017-05-29 ENCOUNTER — Non-Acute Institutional Stay (SKILLED_NURSING_FACILITY): Payer: Medicare Other | Admitting: Internal Medicine

## 2017-05-29 ENCOUNTER — Encounter: Payer: Self-pay | Admitting: Internal Medicine

## 2017-05-29 DIAGNOSIS — I10 Essential (primary) hypertension: Secondary | ICD-10-CM

## 2017-05-29 DIAGNOSIS — R1310 Dysphagia, unspecified: Secondary | ICD-10-CM

## 2017-05-29 DIAGNOSIS — G629 Polyneuropathy, unspecified: Secondary | ICD-10-CM | POA: Diagnosis not present

## 2017-05-29 NOTE — Progress Notes (Signed)
Location:  [facility] Room Number: 211D Place of Service:  SNF (31)  [name], MD  [name], [name]  Extended Emergency Contact Information Primary Emergency Contact: [name] Address: [address]          [place], [place] [address] [name] of [place] Phone: [phone] Relation: Son Secondary Emergency Contact: [name],[name]          [place], [place] Relation: Sister    Allergies: Demerol [meperidine]  Chief Complaint  Patient presents with  . Medical Management of Chronic Issues    HPI: Patient is 82 y.o. female who is being seen for routine issues of polyneuropathy, hypertension, and dysphasia.  Past Medical History:  Diagnosis Date  . Anasarca 05/21/2016  . Anxiety 11/03/2016  . Closed TBI (traumatic brain injury) ([facility]) 04/21/2016  . Dysphagia 07/13/2016  . Gout   . Hypertension   . Obesity (BMI 30.0-34.9) 05/21/2016  . Status post insertion of percutaneous endoscopic gastrostomy (PEG) tube ([facility]) 04/21/2016    Past Surgical History:  Procedure Laterality Date  . INTRAMEDULLARY (IM) NAIL INTERTROCHANTERIC Right 12/27/2016   Procedure: INTRAMEDULLARY (IM) NAIL INTERTROCHANTRIC RIGHT HIP;  Surgeon: Paralee Cancel, MD;  Location: [facility];  Service: Orthopedics;  Laterality: Right;  . MASTECTOMY      Allergies as of 05/29/2017      Reactions   Demerol [meperidine] Swelling      Medication List        Accurate as of 05/29/17 11:59 PM. Always use your most recent med list.          ALPRAZolam 0.25 MG tablet Commonly known as:  XANAX Take 0.25 mg by mouth at bedtime.   ENSURE Take 237 mLs by mouth 3 (three) times daily between meals.   feeding supplement (PRO-STAT SUGAR FREE 64) Liqd Take 30 mLs by mouth 2 (two) times daily.   furosemide 20 MG tablet Commonly known as:  LASIX Take 20 mg by mouth daily.     gabapentin 300 MG capsule Commonly known as:  NEURONTIN Take 300 mg by mouth 3 (three) times daily.   methocarbamol 500 MG tablet Commonly known as:  ROBAXIN Take 1,000 mg by mouth 3 (three) times daily. For  muscle spasm   multivitamin with minerals tablet Take 1 tablet by mouth daily.   oxyCODONE 5 MG immediate release tablet Commonly known as:  Oxy IR/ROXICODONE Take 5 mg by mouth every 8 (eight) hours.   sennosides-docusate sodium 8.6-50 MG tablet Commonly known as:  SENOKOT-S Take 2 tablets by mouth 2 (two) times daily as needed for constipation.       No orders of the defined types were placed in this encounter.   Immunization History  Administered Date(s) Administered  . Influenza-Unspecified 12/19/2016  . PPD Test 04/29/2016  . Pneumococcal Polysaccharide-23 04/15/2016  . Tdap 03/31/2016    Social History   Tobacco Use  . Smoking status: Never Smoker  . Smokeless tobacco: Never Used  Substance Use Topics  . Alcohol use: No    Review of Systems  DATA OBTAINED: from patient, nurse GENERAL:  no fevers, fatigue, appetite changes SKIN: No itching, rash HEENT: No complaint RESPIRATORY: No cough, wheezing, SOB CARDIAC: No chest pain, palpitations, lower extremity edema  GI: No abdominal pain, No N/V/D or constipation, No heartburn or reflux  GU: No dysuria, frequency or urgency, or incontinence  MUSCULOSKELETAL: No unrelieved bone/joint  pain NEUROLOGIC: No headache, dizziness  PSYCHIATRIC: No overt anxiety or sadness  Vitals:   05/29/17 1434  BP: (!) 157/75  Pulse: 81  Resp: 17  Temp: 98.6 F (37 C)  SpO2: 98%   Body mass index is 28.62 kg/m. Physical Exam  GENERAL APPEARANCE: Alert, conversant, No acute distress  SKIN:  HEENT: Unremarkable RESPIRATORY: Breathing is even, unlabored. Lung sounds are clear   CARDIOVASCULAR: Heart RRR no murmurs, rubs or gallops. No peripheral edema  GASTROINTESTINAL: Abdomen is soft, non-tender, not distended  w/ normal bowel sounds.  GENITOURINARY: Bladder non tender, not distended  MUSCULOSKELETAL: No abnormal joints or musculature NEUROLOGIC: Cranial nerves 2-12 grossly intact. Moves all extremities PSYCHIATRIC: Mood and affect appropriate for status post TBI, no behavioral issues  Patient Active Problem List   Diagnosis Date Noted  . History of total right hip arthroplasty 01/05/2017  . Acute blood loss as cause of postoperative anemia 01/05/2017  . Escherichia coli urinary tract infection 01/05/2017  . Acute encephalopathy 01/05/2017  . Depression, recurrent ([facility]) 01/05/2017  . Polyneuropathy 01/05/2017  . Closed right hip fracture ([facility]) 12/25/2016  . Anxiety 11/03/2016  . Dysphagia 07/13/2016  . Anasarca 05/21/2016  . Obesity (BMI 30.0-34.9) 05/21/2016  . Closed TBI (traumatic brain injury) ([facility]) 04/21/2016  . Status post insertion of percutaneous endoscopic gastrostomy (PEG) tube ([facility]) 04/21/2016  . Facial laceration 04/21/2016  . Cheek wound, right, subsequent encounter 04/21/2016  . Urinary tract infection due to extended-spectrum beta lactamase (ESBL) producing Escherichia coli 04/21/2016  . Hypotension 04/21/2016  . Gout   . Hypertension     CMP     Component Value Date/Time   NA 141 01/11/2017   K 3.6 01/11/2017   CL 106 12/29/2016 0520   CO2 28 12/29/2016 0520   GLUCOSE 94 12/29/2016 0520   BUN 13 01/11/2017   CREATININE 0.4 (A) 01/11/2017   CREATININE 0.52 12/29/2016 0520   CALCIUM 8.3 (L) 12/29/2016 0520   PROT 5.7 (L) 12/26/2016 0557   ALBUMIN 2.9 (L) 12/26/2016 0557   AST 16 12/26/2016 0557   ALT 12 (L) 12/26/2016 0557   ALKPHOS 63 12/26/2016 0557   BILITOT 0.6 12/26/2016 0557   GFRNONAA >60 12/29/2016 0520   GFRAA >60 12/29/2016 0520   Recent Labs    12/26/16 0557 12/28/16 0558 12/29/16 0520 01/11/17  NA 142 138 142 141  K 3.8 4.2 3.5 3.6  CL 106 103 106  --   CO2 30 28 28   --   GLUCOSE 119* 140* 94  --   BUN 14 15 21* 13  CREATININE 0.56 0.58  0.52 0.4*  CALCIUM 8.5* 8.1* 8.3*  --    Recent Labs    12/25/16 1800 12/26/16 0557  AST 21 16  ALT 15 12*  ALKPHOS 69 63  BILITOT 0.7 0.6  PROT 6.0* 5.7*  ALBUMIN 3.2* 2.9*   Recent Labs    12/25/16 1800 12/26/16 0557 12/28/16 0558 12/28/16 2137 12/29/16 0520 01/11/17  WBC 10.6* 10.8* 6.6  --  10.2 7.6  NEUTROABS 8.7*  --   --   --   --   --   HGB 11.5* 10.1* 6.4* 8.4* 8.0* 8.8*  HCT 35.4* 31.4* 18.9* 25.0* 23.8* 26*  MCV 89.6 91.5 88.3  --  89.8  --   PLT 172 176 137*  --  166 284   No results for input(s): CHOL, LDLCALC, TRIG in the last 8760 hours.  Invalid input(s): HCL No results found for: [facility] Lab Results  Component Value Date   TSH 1.68 08/06/2013   No results found for: HGBA1C No results found for: CHOL, HDL, LDLCALC, LDLDIRECT, TRIG, CHOLHDL  Significant Diagnostic Results in last 30 days:  No results found.  Assessment and Plan  Polyneuropathy No reported complaints or problems; continue Neurontin 300 mg 3 times daily  Hypertension Stable; continue Lasix 20 mg daily  Dysphagia Not a problem; patient is eating all of her meals by mouth     Nathali Vent D. [name], MD

## 2017-06-02 DIAGNOSIS — L89612 Pressure ulcer of right heel, stage 2: Secondary | ICD-10-CM | POA: Diagnosis not present

## 2017-06-02 DIAGNOSIS — L89150 Pressure ulcer of sacral region, unstageable: Secondary | ICD-10-CM | POA: Diagnosis not present

## 2017-06-09 DIAGNOSIS — L89612 Pressure ulcer of right heel, stage 2: Secondary | ICD-10-CM | POA: Diagnosis not present

## 2017-06-09 DIAGNOSIS — L89150 Pressure ulcer of sacral region, unstageable: Secondary | ICD-10-CM | POA: Diagnosis not present

## 2017-06-16 DIAGNOSIS — L89612 Pressure ulcer of right heel, stage 2: Secondary | ICD-10-CM | POA: Diagnosis not present

## 2017-06-16 DIAGNOSIS — L89150 Pressure ulcer of sacral region, unstageable: Secondary | ICD-10-CM | POA: Diagnosis not present

## 2017-06-17 DIAGNOSIS — F325 Major depressive disorder, single episode, in full remission: Secondary | ICD-10-CM | POA: Diagnosis not present

## 2017-06-17 DIAGNOSIS — F419 Anxiety disorder, unspecified: Secondary | ICD-10-CM | POA: Diagnosis not present

## 2017-06-19 DIAGNOSIS — H25813 Combined forms of age-related cataract, bilateral: Secondary | ICD-10-CM | POA: Diagnosis not present

## 2017-06-19 DIAGNOSIS — H401131 Primary open-angle glaucoma, bilateral, mild stage: Secondary | ICD-10-CM | POA: Diagnosis not present

## 2017-06-19 DIAGNOSIS — H43813 Vitreous degeneration, bilateral: Secondary | ICD-10-CM | POA: Diagnosis not present

## 2017-06-19 DIAGNOSIS — H04123 Dry eye syndrome of bilateral lacrimal glands: Secondary | ICD-10-CM | POA: Diagnosis not present

## 2017-06-22 ENCOUNTER — Encounter: Payer: Self-pay | Admitting: Internal Medicine

## 2017-06-22 NOTE — Assessment & Plan Note (Signed)
Not a problem; patient is eating all of her meals by mouth

## 2017-06-22 NOTE — Assessment & Plan Note (Signed)
No reported complaints or problems; continue Neurontin 300 mg 3 times daily

## 2017-06-22 NOTE — Assessment & Plan Note (Signed)
Stable; continue Lasix 20 mg daily

## 2017-06-24 ENCOUNTER — Non-Acute Institutional Stay (SKILLED_NURSING_FACILITY): Payer: Medicare Other | Admitting: Internal Medicine

## 2017-06-24 ENCOUNTER — Encounter: Payer: Self-pay | Admitting: Internal Medicine

## 2017-06-24 DIAGNOSIS — F419 Anxiety disorder, unspecified: Secondary | ICD-10-CM

## 2017-06-24 DIAGNOSIS — S069X6D Unspecified intracranial injury with loss of consciousness greater than 24 hours without return to pre-existing conscious level with patient surviving, subsequent encounter: Secondary | ICD-10-CM

## 2017-06-24 DIAGNOSIS — F339 Major depressive disorder, recurrent, unspecified: Secondary | ICD-10-CM

## 2017-06-24 NOTE — Progress Notes (Signed)
Location:  [facility] Room Number: 211-D Place of Service:  SNF (31)  [name], MD  [name], [name]  Extended Emergency Contact Information Primary Emergency Contact: [name] Address: [address]          [place], [place] [address] [name] of [place] Phone: [phone] Relation: Son Secondary Emergency Contact: [name],[name]          [place], [place] Relation: Sister    Allergies: Demerol [meperidine]  Chief Complaint  Patient presents with  . Medical Management of Chronic Issues    Routine Visit   . Health Maintenance    Discuss need for DEXA and Pneumonia Vaccine     HPI: Patient is 82 y.o. female who is being seen for routine issues of depression, status post TBI, and anxiety.  Past Medical History:  Diagnosis Date  . Anasarca 05/21/2016  . Anxiety 11/03/2016  . Closed TBI (traumatic brain injury) ([facility]) 04/21/2016  . Dysphagia 07/13/2016  . Gout   . Hypertension   . Obesity (BMI 30.0-34.9) 05/21/2016  . Status post insertion of percutaneous endoscopic gastrostomy (PEG) tube ([facility]) 04/21/2016    Past Surgical History:  Procedure Laterality Date  . CHOLECYSTECTOMY N/A 07/21/2017   Procedure: LAPAROSCOPIC CHOLECYSTECTOMY;  Surgeon: Erroll Luna, MD;  Location: [facility];  Service: General;  Laterality: N/A;  . INTRAMEDULLARY (IM) NAIL INTERTROCHANTERIC Right 12/27/2016   Procedure: INTRAMEDULLARY (IM) NAIL INTERTROCHANTRIC RIGHT HIP;  Surgeon: Paralee Cancel, MD;  Location: [facility];  Service: Orthopedics;  Laterality: Right;  . MASTECTOMY      Allergies as of 06/24/2017      Reactions   Demerol [meperidine] Swelling      Medication List        Accurate as of 06/24/17 11:59 PM. Always use your most recent med list.          ALPRAZolam 0.25 MG tablet Commonly known as:  XANAX Take 0.25 mg by mouth at bedtime.     bimatoprost 0.01 % Soln Commonly known as:  LUMIGAN Place 1 drop into both eyes at bedtime. For glaucoma   ENSURE Take 237 mLs by mouth 3 (three) times daily between meals.   feeding supplement (PRO-STAT SUGAR FREE 64) Liqd Take 30 mLs by mouth 2 (two) times daily.   furosemide 20 MG tablet Commonly known as:  LASIX Take 20 mg by mouth daily.   gabapentin 600 MG tablet Commonly known as:  NEURONTIN Take 600 mg by mouth 3 (three) times daily.   methocarbamol 500 MG tablet Commonly known as:  ROBAXIN Take 1,000 mg by mouth 3 (three) times daily. For  muscle spasms   multivitamin with minerals tablet Take 1 tablet by mouth daily.   oxyCODONE-acetaminophen 10-325 MG tablet Commonly known as:  PERCOCET Take 1 tablet by mouth every 8 (eight) hours.   sennosides-docusate sodium 8.6-50 MG tablet Commonly known as:  SENOKOT-S Take 2 tablets by mouth 2 (two) times daily as needed for constipation.       No orders of the defined types were placed in this encounter.   Immunization History  Administered Date(s) Administered  . Influenza-Unspecified 12/19/2016  . PPD Test 04/29/2016  . Pneumococcal Polysaccharide-23 04/15/2016  . Tdap 03/31/2016    Social History   Tobacco Use  . Smoking status: Never Smoker  . Smokeless tobacco: Never Used  Substance Use Topics  . Alcohol use: No  Review of Systems  DATA OBTAINED: from patient-limited; nursing- no acute concerns GENERAL:  no fevers, fatigue, appetite changes SKIN: No itching, rash HEENT: No complaint RESPIRATORY: No cough, wheezing, SOB CARDIAC: No chest pain, palpitations, lower extremity edema  GI: No abdominal pain, No N/V/D or constipation, No heartburn or reflux  GU: No dysuria, frequency or urgency, or incontinence  MUSCULOSKELETAL: No unrelieved bone/joint pain NEUROLOGIC: No headache, dizziness  PSYCHIATRIC: No overt anxiety or sadness  Vitals:   06/24/17 1435  BP: 131/67  Pulse: 78  Temp: 97.9  F (36.6 C)  SpO2: 96%   Body mass index is 28.62 kg/m. Physical Exam  GENERAL APPEARANCE: Alert, conversant, No acute distress  SKIN: No diaphoresis rash HEENT: Unremarkable RESPIRATORY: Breathing is even, unlabored. Lung sounds are clear   CARDIOVASCULAR: Heart RRR no murmurs, rubs or gallops. No peripheral edema  GASTROINTESTINAL: Abdomen is soft, non-tender, not distended w/ normal bowel sounds.  GENITOURINARY: Bladder non tender, not distended  MUSCULOSKELETAL: No abnormal joints or musculature NEUROLOGIC: Cranial nerves 2-12 grossly intact. Moves all extremities PSYCHIATRIC: Mood and affect appropriate to situation with some deficit, no behavioral issues  Patient Active Problem List   Diagnosis Date Noted  . Cholecystitis, acute with cholelithiasis 07/20/2017  . Pressure injury of skin 07/20/2017  . History of total right hip arthroplasty 01/05/2017  . Acute blood loss as cause of postoperative anemia 01/05/2017  . Escherichia coli urinary tract infection 01/05/2017  . Acute encephalopathy 01/05/2017  . Depression, recurrent ([facility]) 01/05/2017  . Polyneuropathy 01/05/2017  . Closed right hip fracture ([facility]) 12/25/2016  . Anxiety 11/03/2016  . Dysphagia 07/13/2016  . Anasarca 05/21/2016  . Obesity (BMI 30.0-34.9) 05/21/2016  . Closed TBI (traumatic brain injury) ([facility]) 04/21/2016  . Status post insertion of percutaneous endoscopic gastrostomy (PEG) tube ([facility]) 04/21/2016  . Facial laceration 04/21/2016  . Cheek wound, right, subsequent encounter 04/21/2016  . Urinary tract infection due to extended-spectrum beta lactamase (ESBL) producing Escherichia coli 04/21/2016  . Hypotension 04/21/2016  . Gout   . Hypertension     CMP     Component Value Date/Time   NA 141 07/22/2017 1002   NA 141 01/11/2017   K 4.3 07/22/2017 1002   CL 107 07/22/2017 1002   CO2 26 07/22/2017 1002   GLUCOSE 156 (H) 07/22/2017 1002   BUN 5 (L) 07/22/2017 1002   BUN 13 01/11/2017    CREATININE 0.68 07/22/2017 1002   CALCIUM 8.5 (L) 07/22/2017 1002   PROT 5.7 (L) 07/22/2017 1002   ALBUMIN 2.6 (L) 07/22/2017 1002   AST 28 07/22/2017 1002   ALT 36 07/22/2017 1002   ALKPHOS 97 07/22/2017 1002   BILITOT 0.7 07/22/2017 1002   GFRNONAA >60 07/22/2017 1002   GFRAA >60 07/22/2017 1002   Recent Labs    07/19/17 2107 07/20/17 0537 07/22/17 1002  NA 143 141 141  K 3.1* 2.9* 4.3  CL 101 104 107  CO2 29 28 26   GLUCOSE 92 93 156*  BUN 10 7 5*  CREATININE 0.58 0.45 0.68  CALCIUM 8.6* 8.4* 8.5*   Recent Labs    07/19/17 2107 07/20/17 0537 07/22/17 1002  AST 57* 39 28  ALT 80* 63* 36  ALKPHOS 134* 115 97  BILITOT 1.2 1.0 0.7  PROT 6.4* 5.8* 5.7*  ALBUMIN 3.0* 2.5* 2.6*   Recent Labs    12/25/16 1800  07/19/17 2107 07/20/17 0537 07/22/17 1002  WBC 10.6*   < > 10.7* 7.8 20.0*  NEUTROABS 8.7*  --  8.2*  --   --   HGB 11.5*   < > 12.9 12.0 12.3  HCT 35.4*   < > 41.9 38.4 39.7  MCV 89.6   < > 89.7 88.9 90.6  PLT 172   < > 181 192 220   < > = values in this interval not displayed.   No results for input(s): CHOL, LDLCALC, TRIG in the last 8760 hours.  Invalid input(s): HCL No results found for: [facility] Lab Results  Component Value Date   TSH 1.68 08/06/2013   No results found for: HGBA1C No results found for: CHOL, HDL, LDLCALC, LDLDIRECT, TRIG, CHOLHDL  Significant Diagnostic Results in last 30 days:  Dg Chest 2 View  Result Date: 07/19/2017 CLINICAL DATA:  Cough and sore throat with loss of voice yesterday. EXAM: CHEST - 2 VIEW COMPARISON:  None. FINDINGS: Heart is top-normal in size with moderate aortic atherosclerosis. No aneurysm. Platelike atelectasis and/or scarring noted the left upper lobe with left basilar subsegmental atelectasis also noted. No confluent airspace disease suspicious for pneumonia. No dominant mass. Mild interstitial edema is seen. No alveolar consolidation, effusion or pneumothorax. Remote healed fracture deformity of the  right humeral head and neck. No acute osseous abnormality. IMPRESSION: 1. Platelike atelectasis and/or scarring in the left upper lobe. 2. Aortic atherosclerosis without aneurysm. 3. Mild interstitial edema. 4. Healed fracture deformity of the right proximal humerus. Electronically Signed   By: Ashley Royalty M.D.   On: 07/19/2017 18:55   US Abdomen Limited Ruq  Result Date: 07/19/2017 CLINICAL DATA:  Elevated liver enzymes EXAM: ULTRASOUND ABDOMEN LIMITED RIGHT UPPER QUADRANT COMPARISON:  None. FINDINGS: Gallbladder: Gallbladder nondistended. Several echogenic gallstones present measuring up to 5 mm. Small amount of sludge. No pericholecystic fluid. No gallbladder wall thickening. Negative sonographic Murphy's sign. Tenderness over gallbladder. Common bile duct: Diameter: Mildly dilated from 7-8 mm Liver: No focal lesion identified. Within normal limits in parenchymal echogenicity. Portal vein is patent on color Doppler imaging with normal direction of blood flow towards the liver. IMPRESSION: 1. Cholelithiasis and dilated common bile duct. 2. Tenderness over the gallbladder without frank Murphy sign. 3. Recommend correlation for cholecystitis or biliary obstruction. Electronically Signed   By: Suzy Bouchard M.D.   On: 07/19/2017 23:51    Assessment and Plan  Depression, recurrent ([place]) Reasonably well-controlled; continue Lexapro 5 mg p.o. daily  Closed TBI (traumatic brain injury) ([place]) Patient still with moderately severe mental compromise from her traumatic brain injury; continue supportive care  Anxiety Well-controlled; continue Xanax 0.5 mg twice daily    Sheran Newstrom D. [name], M.D.

## 2017-06-30 DIAGNOSIS — L89612 Pressure ulcer of right heel, stage 2: Secondary | ICD-10-CM | POA: Diagnosis not present

## 2017-06-30 DIAGNOSIS — L89150 Pressure ulcer of sacral region, unstageable: Secondary | ICD-10-CM | POA: Diagnosis not present

## 2017-07-01 DIAGNOSIS — F419 Anxiety disorder, unspecified: Secondary | ICD-10-CM | POA: Diagnosis not present

## 2017-07-01 DIAGNOSIS — R45851 Suicidal ideations: Secondary | ICD-10-CM | POA: Diagnosis not present

## 2017-07-01 DIAGNOSIS — F329 Major depressive disorder, single episode, unspecified: Secondary | ICD-10-CM | POA: Diagnosis not present

## 2017-07-16 ENCOUNTER — Non-Acute Institutional Stay (SKILLED_NURSING_FACILITY): Payer: Medicare Other | Admitting: Internal Medicine

## 2017-07-16 ENCOUNTER — Encounter: Payer: Self-pay | Admitting: Internal Medicine

## 2017-07-16 DIAGNOSIS — R1310 Dysphagia, unspecified: Secondary | ICD-10-CM

## 2017-07-16 DIAGNOSIS — G629 Polyneuropathy, unspecified: Secondary | ICD-10-CM

## 2017-07-16 DIAGNOSIS — I10 Essential (primary) hypertension: Secondary | ICD-10-CM | POA: Diagnosis not present

## 2017-07-16 NOTE — Progress Notes (Signed)
Location:  [facility] Room Number: [facility]:  SNF [phone])  [name], MD  [name], [name]  Extended Emergency Contact Information Primary Emergency Contact: [name] Address: [address]          [place], [place] [address] [name] of [place] Phone: [phone] Relation: Son Secondary Emergency Contact: [name],[name]          [place], [place] Relation: Sister    Allergies: Demerol [meperidine]  Chief Complaint  Patient presents with  . Medical Management of Chronic Issues    HPI: Patient is 82 y.o. female who is being seen for routine issues of hypertension, dysphasia, and polyneuropathy.  Past Medical History:  Diagnosis Date  . Anasarca 05/21/2016  . Anxiety 11/03/2016  . Closed TBI (traumatic brain injury) ([facility]) 04/21/2016  . Dysphagia 07/13/2016  . Gout   . Hypertension   . Obesity (BMI 30.0-34.9) 05/21/2016  . Status post insertion of percutaneous endoscopic gastrostomy (PEG) tube ([facility]) 04/21/2016    Past Surgical History:  Procedure Laterality Date  . CHOLECYSTECTOMY N/A 07/21/2017   Procedure: LAPAROSCOPIC CHOLECYSTECTOMY;  Surgeon: Erroll Luna, MD;  Location: [facility];  Service: General;  Laterality: N/A;  . INTRAMEDULLARY (IM) NAIL INTERTROCHANTERIC Right 12/27/2016   Procedure: INTRAMEDULLARY (IM) NAIL INTERTROCHANTRIC RIGHT HIP;  Surgeon: Paralee Cancel, MD;  Location: [facility];  Service: Orthopedics;  Laterality: Right;  . MASTECTOMY      Allergies as of 07/16/2017      Reactions   Demerol [meperidine] Swelling      Medication List        Accurate as of 07/16/17 11:59 PM. Always use your most recent med list.          ALPRAZolam 0.5 MG tablet Commonly known as:  XANAX Take 0.5 mg by mouth 2 (two) times daily.   bimatoprost 0.01 % Soln Commonly known as:  LUMIGAN Place 1 drop into both eyes at  bedtime. For glaucoma   ENSURE Take 237 mLs by mouth 3 (three) times daily between meals.   escitalopram 5 MG tablet Commonly known as:  LEXAPRO Take 5 mg by mouth daily.   furosemide 20 MG tablet Commonly known as:  LASIX Take 20 mg by mouth daily.   gabapentin 600 MG tablet Commonly known as:  NEURONTIN Take 600 mg by mouth 3 (three) times daily.   methocarbamol 500 MG tablet Commonly known as:  ROBAXIN Take 1,000 mg by mouth 3 (three) times daily. For  muscle spasms   oxyCODONE-acetaminophen 10-325 MG tablet Commonly known as:  PERCOCET Take 1 tablet by mouth every 8 (eight) hours.   sennosides-docusate sodium 8.6-50 MG tablet Commonly known as:  SENOKOT-S Take 2 tablets by mouth 2 (two) times daily as needed for constipation.       No orders of the defined types were placed in this encounter.   Immunization History  Administered Date(s) Administered  . Influenza-Unspecified 12/19/2016  . PPD Test 04/29/2016  . Pneumococcal Conjugate-13 08/06/2016  . Pneumococcal Polysaccharide-23 04/15/2016  . Tdap 03/31/2016    Social History   Tobacco Use  . Smoking status: Never Smoker  . Smokeless tobacco: Never Used  Substance Use Topics  . Alcohol use: No    Review of Systems  DATA OBTAINED: from patient-very limited; nursing-no acute concerns GENERAL:  no fevers, fatigue, appetite changes SKIN: No itching, rash HEENT: No complaint RESPIRATORY:  No cough, wheezing, SOB CARDIAC: No chest pain, palpitations, lower extremity edema  GI: No abdominal pain, No N/V/D or constipation, No heartburn or reflux  GU: No dysuria, frequency or urgency, or incontinence  MUSCULOSKELETAL: No unrelieved bone/joint pain NEUROLOGIC: No headache, dizziness  PSYCHIATRIC: No overt anxiety or sadness  Vitals:   07/16/17 1403  BP: 130/67  Pulse: 78  Resp: 18  Temp: (!) 96.5 F (35.8 C)  SpO2: 97%   Body mass index is 28.76 kg/m. Physical Exam  GENERAL APPEARANCE: Alert,  conversant, No acute distress  SKIN: No diaphoresis rash: Scar on cheek HEENT: Unremarkable RESPIRATORY: Breathing is even, unlabored. Lung sounds are clear   CARDIOVASCULAR: Heart RRR no murmurs, rubs or gallops. No peripheral edema  GASTROINTESTINAL: Abdomen is soft, non-tender, not distended w/ normal bowel sounds.  GENITOURINARY: Bladder non tender, not distended  MUSCULOSKELETAL: No abnormal joints or musculature NEUROLOGIC: Cranial nerves 2-12 grossly intact. Moves all extremities PSYCHIATRIC: Mood and affect consistent with TBI, no behavioral issues  Patient Active Problem List   Diagnosis Date Noted  . Cholecystitis 07/31/2017  . Cholecystitis, acute with cholelithiasis 07/20/2017  . Pressure injury of skin 07/20/2017  . History of total right hip arthroplasty 01/05/2017  . Acute blood loss as cause of postoperative anemia 01/05/2017  . Escherichia coli urinary tract infection 01/05/2017  . Acute encephalopathy 01/05/2017  . Depression, recurrent ([facility]) 01/05/2017  . Polyneuropathy 01/05/2017  . Closed right hip fracture ([facility]) 12/25/2016  . Anxiety 11/03/2016  . Dysphagia 07/13/2016  . Anasarca 05/21/2016  . Obesity (BMI 30.0-34.9) 05/21/2016  . Closed TBI (traumatic brain injury) ([facility]) 04/21/2016  . Status post insertion of percutaneous endoscopic gastrostomy (PEG) tube ([facility]) 04/21/2016  . Facial laceration 04/21/2016  . Cheek wound, right, subsequent encounter 04/21/2016  . Urinary tract infection due to extended-spectrum beta lactamase (ESBL) producing Escherichia coli 04/21/2016  . Hypotension 04/21/2016  . Gout   . Hypertension     CMP     Component Value Date/Time   NA 145 07/28/2017   K 3.7 07/28/2017   CL 107 07/22/2017 1002   CO2 26 07/22/2017 1002   GLUCOSE 156 (H) 07/22/2017 1002   BUN 12 07/28/2017   CREATININE 0.39 07/28/2017   CALCIUM 8.1 07/28/2017   PROT 5.7 (L) 07/22/2017 1002   ALBUMIN 2.6 (L) 07/22/2017 1002   AST 28 07/22/2017 1002   ALT  36 07/22/2017 1002   ALKPHOS 97 07/22/2017 1002   BILITOT 0.7 07/22/2017 1002   GFRNONAA >90 07/28/2017   GFRAA >60 07/22/2017 1002   Recent Labs    07/19/17 2107 07/20/17 0537 07/22/17 1002 07/28/17  NA 143 141 141 145  K 3.1* 2.9* 4.3 3.7  CL 101 104 107  --   CO2 29 28 26   --   GLUCOSE 92 93 156*  --   BUN 10 7 5* 12  CREATININE 0.58 0.45 0.68 0.39  CALCIUM 8.6* 8.4* 8.5* 8.1   Recent Labs    07/19/17 2107 07/20/17 0537 07/22/17 1002  AST 57* 39 28  ALT 80* 63* 36  ALKPHOS 134* 115 97  BILITOT 1.2 1.0 0.7  PROT 6.4* 5.8* 5.7*  ALBUMIN 3.0* 2.5* 2.6*   Recent Labs    12/25/16 1800  07/19/17 2107 07/20/17 0537 07/22/17 1002 07/28/17  WBC 10.6*   < > 10.7* 7.8 20.0* 8.7  NEUTROABS 8.7*  --  8.2*  --   --   --   HGB 11.5*   < > 12.9 12.0  12.3 11.3  HCT 35.4*   < > 41.9 38.4 39.7 35.0  MCV 89.6   < > 89.7 88.9 90.6 85.1  PLT 172   < > 181 192 220  --    < > = values in this interval not displayed.   No results for input(s): CHOL, LDLCALC, TRIG in the last 8760 hours.  Invalid input(s): HCL No results found for: MICROALBUR Lab Results  Component Value Date   TSH 1.68 08/06/2013   No results found for: HGBA1C No results found for: CHOL, HDL, LDLCALC, LDLDIRECT, TRIG, CHOLHDL  Significant Diagnostic Results in last 30 days:  Dg Chest 2 View  Result Date: 07/19/2017 CLINICAL DATA:  Cough and sore throat with loss of voice yesterday. EXAM: CHEST - 2 VIEW COMPARISON:  None. FINDINGS: Heart is top-normal in size with moderate aortic atherosclerosis. No aneurysm. Platelike atelectasis and/or scarring noted the left upper lobe with left basilar subsegmental atelectasis also noted. No confluent airspace disease suspicious for pneumonia. No dominant mass. Mild interstitial edema is seen. No alveolar consolidation, effusion or pneumothorax. Remote healed fracture deformity of the right humeral head and neck. No acute osseous abnormality. IMPRESSION: 1. Platelike  atelectasis and/or scarring in the left upper lobe. 2. Aortic atherosclerosis without aneurysm. 3. Mild interstitial edema. 4. Healed fracture deformity of the right proximal humerus. Electronically Signed   By: Ashley Royalty M.D.   On: 07/19/2017 18:55   US Abdomen Limited Ruq  Result Date: 07/19/2017 CLINICAL DATA:  Elevated liver enzymes EXAM: ULTRASOUND ABDOMEN LIMITED RIGHT UPPER QUADRANT COMPARISON:  None. FINDINGS: Gallbladder: Gallbladder nondistended. Several echogenic gallstones present measuring up to 5 mm. Small amount of sludge. No pericholecystic fluid. No gallbladder wall thickening. Negative sonographic Murphy's sign. Tenderness over gallbladder. Common bile duct: Diameter: Mildly dilated from 7-8 mm Liver: No focal lesion identified. Within normal limits in parenchymal echogenicity. Portal vein is patent on color Doppler imaging with normal direction of blood flow towards the liver. IMPRESSION: 1. Cholelithiasis and dilated common bile duct. 2. Tenderness over the gallbladder without frank Murphy sign. 3. Recommend correlation for cholecystitis or biliary obstruction. Electronically Signed   By: Suzy Bouchard M.D.   On: 07/19/2017 23:51    Assessment and Plan  Hypertension Controlled; continue Lasix 20 mg daily  Dysphagia She continues to eat her diet without problems; continue to monitor  Polyneuropathy No reports of problems; continue Neurontin 600 mg 3 times a day     Deante Blough D. [name], MD

## 2017-07-17 ENCOUNTER — Non-Acute Institutional Stay (SKILLED_NURSING_FACILITY): Payer: Medicare Other | Admitting: Internal Medicine

## 2017-07-17 ENCOUNTER — Encounter: Payer: Self-pay | Admitting: Internal Medicine

## 2017-07-17 DIAGNOSIS — R05 Cough: Secondary | ICD-10-CM | POA: Diagnosis not present

## 2017-07-17 DIAGNOSIS — R69 Illness, unspecified: Secondary | ICD-10-CM

## 2017-07-17 DIAGNOSIS — R509 Fever, unspecified: Secondary | ICD-10-CM | POA: Diagnosis not present

## 2017-07-17 NOTE — Progress Notes (Signed)
Location:  [facility] Room Number: 211-D Place of Service:  SNF (31)  [name], MD  [name], [name]  Extended Emergency Contact Information Primary Emergency Contact: [name] Address: [address]          [place], [place] [address] [name] of [place] Phone: [phone] Relation: Son Secondary Emergency Contact: [name],[name]          [place], [place] Relation: Sister    Allergies: Demerol [meperidine]  Chief Complaint  Patient presents with  . Acute Visit    Low grade fever    HPI: Patient is 82 y.o. female who is being seen for a low-grade fever for several days.  Patient has been in bed for 2 days, she admits that she feels bad but she denies any urinary symptoms no cough no cold no chest pain no shortness of breath, no nausea vomiting diarrhea  Past Medical History:  Diagnosis Date  . Anasarca 05/21/2016  . Anxiety 11/03/2016  . Closed TBI (traumatic brain injury) ([facility]) 04/21/2016  . Dysphagia 07/13/2016  . Gout   . Hypertension   . Obesity (BMI 30.0-34.9) 05/21/2016  . Status post insertion of percutaneous endoscopic gastrostomy (PEG) tube ([facility]) 04/21/2016    Past Surgical History:  Procedure Laterality Date  . INTRAMEDULLARY (IM) NAIL INTERTROCHANTERIC Right 12/27/2016   Procedure: INTRAMEDULLARY (IM) NAIL INTERTROCHANTRIC RIGHT HIP;  Surgeon: Paralee Cancel, MD;  Location: [facility];  Service: Orthopedics;  Laterality: Right;  . MASTECTOMY      Allergies as of 07/17/2017      Reactions   Demerol [meperidine] Swelling      Medication List        Accurate as of 07/17/17  1:48 PM. Always use your most recent med list.          ALPRAZolam 0.5 MG tablet Commonly known as:  XANAX Take 0.5 mg by mouth 2 (two) times daily.   bimatoprost 0.01 % Soln Commonly known as:  LUMIGAN Place 1 drop into both eyes at  bedtime.   ENSURE Take 237 mLs by mouth 3 (three) times daily between meals.   escitalopram 5 MG tablet Commonly known as:  LEXAPRO Take 5 mg by mouth daily.   furosemide 20 MG tablet Commonly known as:  LASIX Take 20 mg by mouth daily.   gabapentin 600 MG tablet Commonly known as:  NEURONTIN Take 600 mg by mouth 3 (three) times daily.   methocarbamol 500 MG tablet Commonly known as:  ROBAXIN Take 1,000 mg by mouth 3 (three) times daily. For  muscle spasm   oxyCODONE-acetaminophen 10-325 MG tablet Commonly known as:  PERCOCET Take 1 tablet by mouth every 8 (eight) hours.   sennosides-docusate sodium 8.6-50 MG tablet Commonly known as:  SENOKOT-S Take 2 tablets by mouth 2 (two) times daily as needed for constipation.       No orders of the defined types were placed in this encounter.   Immunization History  Administered Date(s) Administered  . Influenza-Unspecified 12/19/2016  . PPD Test 04/29/2016  . Pneumococcal Polysaccharide-23 04/15/2016  . Tdap 03/31/2016    Social History   Tobacco Use  . Smoking status: Never Smoker  . Smokeless tobacco: Never Used  Substance Use Topics  . Alcohol use: No    Review of Systems  DATA OBTAINED: from patient, nurse-both as per history present illness GENERAL:  no fevers,  fatigue, appetite changes SKIN: No itching, rash HEENT: No complaint RESPIRATORY: No cough, wheezing, SOB CARDIAC: No chest pain, palpitations, lower extremity edema  GI: No abdominal pain, No N/V/D or constipation, No heartburn or reflux  GU: No dysuria, frequency or urgency, or incontinence  MUSCULOSKELETAL: No unrelieved bone/joint pain NEUROLOGIC: No headache, dizziness  PSYCHIATRIC: No overt anxiety or sadness  Vitals:   07/17/17 1345  BP: 123/75  Pulse: 83  Resp: 18  Temp: 100 F (37.8 C)   Body mass index is 28.76 kg/m. Physical Exam  GENERAL APPEARANCE: Alert, conversant, No acute distress, looks tired, does not look  septic SKIN: No diaphoresis rash HEENT: Unremarkable RESPIRATORY: Breathing is even, unlabored. Lung sounds are clear   CARDIOVASCULAR: Heart RRR no murmurs, rubs or gallops. No peripheral edema  GASTROINTESTINAL: Abdomen is soft, non-tender, not distended w/ normal bowel sounds.  GENITOURINARY: Bladder non tender, not distended  MUSCULOSKELETAL: No abnormal joints or musculature NEUROLOGIC: Cranial nerves 2-12 grossly intact. Moves all extremities PSYCHIATRIC: Mood and affect status post TBI, no behavioral issues  Patient Active Problem List   Diagnosis Date Noted  . History of total right hip arthroplasty 01/05/2017  . Acute blood loss as cause of postoperative anemia 01/05/2017  . Escherichia coli urinary tract infection 01/05/2017  . Acute encephalopathy 01/05/2017  . Depression, recurrent ([facility]) 01/05/2017  . Polyneuropathy 01/05/2017  . Closed right hip fracture ([facility]) 12/25/2016  . Anxiety 11/03/2016  . Dysphagia 07/13/2016  . Anasarca 05/21/2016  . Obesity (BMI 30.0-34.9) 05/21/2016  . Closed TBI (traumatic brain injury) ([facility]) 04/21/2016  . Status post insertion of percutaneous endoscopic gastrostomy (PEG) tube ([facility]) 04/21/2016  . Facial laceration 04/21/2016  . Cheek wound, right, subsequent encounter 04/21/2016  . Urinary tract infection due to extended-spectrum beta lactamase (ESBL) producing Escherichia coli 04/21/2016  . Hypotension 04/21/2016  . Gout   . Hypertension     CMP     Component Value Date/Time   NA 141 01/11/2017   K 3.6 01/11/2017   CL 106 12/29/2016 0520   CO2 28 12/29/2016 0520   GLUCOSE 94 12/29/2016 0520   BUN 13 01/11/2017   CREATININE 0.4 (A) 01/11/2017   CREATININE 0.52 12/29/2016 0520   CALCIUM 8.3 (L) 12/29/2016 0520   PROT 5.7 (L) 12/26/2016 0557   ALBUMIN 2.9 (L) 12/26/2016 0557   AST 16 12/26/2016 0557   ALT 12 (L) 12/26/2016 0557   ALKPHOS 63 12/26/2016 0557   BILITOT 0.6 12/26/2016 0557   GFRNONAA >60 12/29/2016 0520   GFRAA  >60 12/29/2016 0520   Recent Labs    12/26/16 0557 12/28/16 0558 12/29/16 0520 01/11/17  NA 142 138 142 141  K 3.8 4.2 3.5 3.6  CL 106 103 106  --   CO2 30 28 28   --   GLUCOSE 119* 140* 94  --   BUN 14 15 21* 13  CREATININE 0.56 0.58 0.52 0.4*  CALCIUM 8.5* 8.1* 8.3*  --    Recent Labs    12/25/16 1800 12/26/16 0557  AST 21 16  ALT 15 12*  ALKPHOS 69 63  BILITOT 0.7 0.6  PROT 6.0* 5.7*  ALBUMIN 3.2* 2.9*   Recent Labs    12/25/16 1800 12/26/16 0557 12/28/16 0558 12/28/16 2137 12/29/16 0520 01/11/17  WBC 10.6* 10.8* 6.6  --  10.2 7.6  NEUTROABS 8.7*  --   --   --   --   --   HGB 11.5* 10.1* 6.4* 8.4* 8.0* 8.8*  HCT 35.4* 31.4*  18.9* 25.0* 23.8* 26*  MCV 89.6 91.5 88.3  --  89.8  --   PLT 172 176 137*  --  166 284   No results for input(s): CHOL, LDLCALC, TRIG in the last 8760 hours.  Invalid input(s): HCL No results found for: MICROALBUR Lab Results  Component Value Date   TSH 1.68 08/06/2013   No results found for: HGBA1C No results found for: CHOL, HDL, LDLCALC, LDLDIRECT, TRIG, CHOLHDL  Significant Diagnostic Results in last 30 days:  No results found.  Assessment and Plan  Fever/illness- CBC and BMP ordered, as well as chest x-ray; UA with C&S was ordered, urine returned red, not with blood but no odd red color; secondary to the fact that patient is symptomatic we will start Rocephin 1 g IM x3 days while we wait for urine C&S to return; will continue to monitor   Time spent greater than 35 minutes Triana Coover D. [name], MD

## 2017-07-18 DIAGNOSIS — R509 Fever, unspecified: Secondary | ICD-10-CM | POA: Diagnosis not present

## 2017-07-18 DIAGNOSIS — D649 Anemia, unspecified: Secondary | ICD-10-CM | POA: Diagnosis not present

## 2017-07-19 ENCOUNTER — Observation Stay (HOSPITAL_COMMUNITY)
Admission: EM | Admit: 2017-07-19 | Discharge: 2017-07-22 | Disposition: A | Discharge status: Skilled Nursing Facility | Payer: Medicare Other | Attending: Surgery | Admitting: Surgery

## 2017-07-19 ENCOUNTER — Other Ambulatory Visit: Payer: Self-pay

## 2017-07-19 ENCOUNTER — Emergency Department (HOSPITAL_COMMUNITY): Payer: Medicare Other

## 2017-07-19 ENCOUNTER — Encounter (HOSPITAL_COMMUNITY): Payer: Self-pay

## 2017-07-19 DIAGNOSIS — R05 Cough: Secondary | ICD-10-CM | POA: Diagnosis not present

## 2017-07-19 DIAGNOSIS — R0902 Hypoxemia: Secondary | ICD-10-CM | POA: Diagnosis not present

## 2017-07-19 DIAGNOSIS — I700 Atherosclerosis of aorta: Secondary | ICD-10-CM | POA: Diagnosis not present

## 2017-07-19 DIAGNOSIS — F329 Major depressive disorder, single episode, unspecified: Secondary | ICD-10-CM | POA: Insufficient documentation

## 2017-07-19 DIAGNOSIS — Z885 Allergy status to narcotic agent status: Secondary | ICD-10-CM | POA: Diagnosis not present

## 2017-07-19 DIAGNOSIS — H409 Unspecified glaucoma: Secondary | ICD-10-CM | POA: Insufficient documentation

## 2017-07-19 DIAGNOSIS — R748 Abnormal levels of other serum enzymes: Secondary | ICD-10-CM | POA: Insufficient documentation

## 2017-07-19 DIAGNOSIS — I10 Essential (primary) hypertension: Secondary | ICD-10-CM | POA: Insufficient documentation

## 2017-07-19 DIAGNOSIS — K8000 Calculus of gallbladder with acute cholecystitis without obstruction: Secondary | ICD-10-CM | POA: Diagnosis present

## 2017-07-19 DIAGNOSIS — Z79899 Other long term (current) drug therapy: Secondary | ICD-10-CM | POA: Diagnosis not present

## 2017-07-19 DIAGNOSIS — R490 Dysphonia: Secondary | ICD-10-CM | POA: Diagnosis not present

## 2017-07-19 DIAGNOSIS — R945 Abnormal results of liver function studies: Secondary | ICD-10-CM | POA: Diagnosis not present

## 2017-07-19 DIAGNOSIS — K8010 Calculus of gallbladder with chronic cholecystitis without obstruction: Secondary | ICD-10-CM | POA: Diagnosis not present

## 2017-07-19 DIAGNOSIS — R402441 Other coma, without documented Glasgow coma scale score, or with partial score reported, in the field [EMT or ambulance]: Secondary | ICD-10-CM | POA: Diagnosis not present

## 2017-07-19 DIAGNOSIS — Z96641 Presence of right artificial hip joint: Secondary | ICD-10-CM | POA: Diagnosis not present

## 2017-07-19 DIAGNOSIS — G629 Polyneuropathy, unspecified: Secondary | ICD-10-CM | POA: Diagnosis not present

## 2017-07-19 DIAGNOSIS — K8020 Calculus of gallbladder without cholecystitis without obstruction: Secondary | ICD-10-CM | POA: Diagnosis not present

## 2017-07-19 DIAGNOSIS — F419 Anxiety disorder, unspecified: Secondary | ICD-10-CM | POA: Insufficient documentation

## 2017-07-19 DIAGNOSIS — Z87820 Personal history of traumatic brain injury: Secondary | ICD-10-CM | POA: Insufficient documentation

## 2017-07-19 DIAGNOSIS — R059 Cough, unspecified: Secondary | ICD-10-CM

## 2017-07-19 DIAGNOSIS — L8990 Pressure ulcer of unspecified site, unspecified stage: Secondary | ICD-10-CM

## 2017-07-19 LAB — CBC WITH DIFFERENTIAL/PLATELET
Abs Immature Granulocytes: 0 10*3/uL (ref 0.0–0.1)
BASOS PCT: 1 %
Basophils Absolute: 0.1 10*3/uL (ref 0.0–0.1)
Eosinophils Absolute: 0.1 10*3/uL (ref 0.0–0.7)
Eosinophils Relative: 1 %
HCT: 41.9 % (ref 36.0–46.0)
Hemoglobin: 12.9 g/dL (ref 12.0–15.0)
IMMATURE GRANULOCYTES: 0 %
Lymphocytes Relative: 18 %
Lymphs Abs: 1.9 10*3/uL (ref 0.7–4.0)
MCH: 27.6 pg (ref 26.0–34.0)
MCHC: 30.8 g/dL (ref 30.0–36.0)
MCV: 89.7 fL (ref 78.0–100.0)
Monocytes Absolute: 0.4 10*3/uL (ref 0.1–1.0)
Monocytes Relative: 4 %
NEUTROS PCT: 76 %
Neutro Abs: 8.2 10*3/uL — ABNORMAL HIGH (ref 1.7–7.7)
PLATELETS: 181 10*3/uL (ref 150–400)
RBC: 4.67 MIL/uL (ref 3.87–5.11)
RDW: 15.4 % (ref 11.5–15.5)
WBC: 10.7 10*3/uL — AB (ref 4.0–10.5)

## 2017-07-19 LAB — COMPREHENSIVE METABOLIC PANEL
ALBUMIN: 3 g/dL — AB (ref 3.5–5.0)
ALT: 80 U/L — ABNORMAL HIGH (ref 14–54)
AST: 57 U/L — AB (ref 15–41)
Alkaline Phosphatase: 134 U/L — ABNORMAL HIGH (ref 38–126)
Anion gap: 13 (ref 5–15)
BUN: 10 mg/dL (ref 6–20)
CHLORIDE: 101 mmol/L (ref 101–111)
CO2: 29 mmol/L (ref 22–32)
Calcium: 8.6 mg/dL — ABNORMAL LOW (ref 8.9–10.3)
Creatinine, Ser: 0.58 mg/dL (ref 0.44–1.00)
GFR calc Af Amer: >60 mL/min (ref >60)
GFR calc non Af Amer: >60 mL/min (ref >60)
GLUCOSE: 92 mg/dL (ref 65–99)
POTASSIUM: 3.1 mmol/L — AB (ref 3.5–5.1)
SODIUM: 143 mmol/L (ref 135–145)
Total Bilirubin: 1.2 mg/dL (ref 0.3–1.2)
Total Protein: 6.4 g/dL — ABNORMAL LOW (ref 6.5–8.1)

## 2017-07-19 LAB — I-STAT TROPONIN, ED: TROPONIN I, POC: 0 ng/mL (ref 0.00–0.08)

## 2017-07-19 NOTE — ED Provider Notes (Signed)
[facility] EMERGENCY DEPARTMENT Provider Note   CSN: [id number] Arrival date & time: 07/19/17  1753     History   Chief Complaint Chief Complaint  Patient presents with  . Follow-up    HPI Autumn Bryant is a 82 y.o. female.  The history is provided by the patient, medical records and the EMS personnel. No language interpreter was used.   Autumn Bryant is a 82 y.o. female  with a PMH of prior TBI who presents to the Emergency Department from [facility]. Per EMS, patient's son called to speak with patient and noticed her voice was hoarse. He spoke with facility and asked that she be transferred to hospital to get evaluated as he did not like that she was hoarse and was worried about her breathing. When asked why she is in the hospital today, patient is unsure. She notes no complaints. Per EMS, patient is alert to self only at baseline.    Level V caveat applies 2/2 prior TBI   Past Medical History:  Diagnosis Date  . Anasarca 05/21/2016  . Anxiety 11/03/2016  . Closed TBI (traumatic brain injury) ([place]) 04/21/2016  . Dysphagia 07/13/2016  . Gout   . Hypertension   . Obesity (BMI 30.0-34.9) 05/21/2016  . Status post insertion of percutaneous endoscopic gastrostomy (PEG) tube ([place]) 04/21/2016    Patient Active Problem List   Diagnosis Date Noted  . Cholecystitis, acute with cholelithiasis 07/20/2017  . History of total right hip arthroplasty 01/05/2017  . Acute blood loss as cause of postoperative anemia 01/05/2017  . Escherichia coli urinary tract infection 01/05/2017  . Acute encephalopathy 01/05/2017  . Depression, recurrent ([place]) 01/05/2017  . Polyneuropathy 01/05/2017  . Closed right hip fracture ([place]) 12/25/2016  . Anxiety 11/03/2016  . Dysphagia 07/13/2016  . Anasarca 05/21/2016  . Obesity (BMI 30.0-34.9) 05/21/2016  . Closed TBI (traumatic brain injury) ([place]) 04/21/2016  . Status post insertion of percutaneous endoscopic gastrostomy (PEG) tube  ([place]) 04/21/2016  . Facial laceration 04/21/2016  . Cheek wound, right, subsequent encounter 04/21/2016  . Urinary tract infection due to extended-spectrum beta lactamase (ESBL) producing Escherichia coli 04/21/2016  . Hypotension 04/21/2016  . Gout   . Hypertension     Past Surgical History:  Procedure Laterality Date  . INTRAMEDULLARY (IM) NAIL INTERTROCHANTERIC Right 12/27/2016   Procedure: INTRAMEDULLARY (IM) NAIL INTERTROCHANTRIC RIGHT HIP;  Surgeon: Paralee Cancel, MD;  Location: [facility];  Service: Orthopedics;  Laterality: Right;  . MASTECTOMY       OB History   None      Home Medications    Prior to Admission medications   Medication Sig Start Date End Date Taking? Authorizing Provider  ALPRAZolam Duanne Moron) 0.5 MG tablet Take 0.5 mg by mouth 2 (two) times daily.   Yes [provider]  bimatoprost (LUMIGAN) 0.01 % SOLN Place 1 drop into both eyes at bedtime. For glaucoma   Yes [provider]  bisacodyl (DULCOLAX) 10 MG suppository Place 10 mg rectally daily as needed (constipation not relieved by MOM).   Yes [provider]  ENSURE (ENSURE) Take 237 mLs by mouth 3 (three) times daily between meals.   Yes [provider]  escitalopram (LEXAPRO) 5 MG tablet Take 5 mg by mouth daily.   Yes [provider]  furosemide (LASIX) 20 MG tablet Take 20 mg by mouth daily.   Yes [provider]  gabapentin (NEURONTIN) 600 MG tablet Take 600 mg by mouth 3 (three) times  daily.   Yes [provider]  Magnesium Hydroxide (MILK OF MAGNESIA PO) Take 30 mLs by mouth daily as needed (if no BM in 3 days).   Yes [provider]  methocarbamol (ROBAXIN) 500 MG tablet Take 1,000 mg by mouth 3 (three) times daily. For  muscle spasms   Yes [provider]  oxyCODONE-acetaminophen (PERCOCET) 10-325 MG tablet Take 1 tablet by mouth every 8 (eight) hours.   Yes [provider]  sennosides-docusate sodium (SENOKOT-S)  8.6-50 MG tablet Take 2 tablets by mouth 2 (two) times daily as needed for constipation.   Yes [provider]  Sodium Phosphates (FLEET ENEMA RE) Place 1 each rectally daily as needed (constipation not relieved by bisacodyl suppository).   Yes [provider]  cefTRIAXone (ROCEPHIN) 1 g injection Inject 1 g into the muscle See admin instructions. Order date 07/17/17, stop date 07/19/17    [provider]    Family History Family History  Problem Relation Age of Onset  . Asthma Mother   . Anesthesia problems Neg Hx   . Arthritis Neg Hx   . Cancer Neg Hx   . Cerebral palsy Neg Hx   . Clotting disorder Neg Hx   . Club foot Neg Hx   . Collagen disease Neg Hx   . Deep vein thrombosis Neg Hx   . Gait disorder Neg Hx   . Heart disease Neg Hx   . Gout Neg Hx   . Diabetes Neg Hx   . Hip dysplasia Neg Hx   . Hip fracture Neg Hx   . Hypermobility Neg Hx   . Hypertension Neg Hx   . Osteoporosis Neg Hx   . Other Neg Hx   . Stroke Neg Hx   . Thyroid disease Neg Hx   . Vasculitis Neg Hx   . Spina bifida Neg Hx   . Scoliosis Neg Hx   . Rheumatologic disease Neg Hx   . Pulmonary embolism Neg Hx     Social History Social History   Tobacco Use  . Smoking status: Never Smoker  . Smokeless tobacco: Never Used  Substance Use Topics  . Alcohol use: No  . Drug use: No     Allergies   Demerol [meperidine]   Review of Systems Review of Systems  Unable to perform ROS: Other (Patient's baseline condition, prior TBI)     Physical Exam Updated Vital Signs BP (!) 104/94   Pulse 68   Temp 98.6 F (37 C) (Oral)   Resp 20   Ht 5\' 5"  (1.651 m)   Wt 78 kg (172 lb)   LMP  (LMP Unknown)   SpO2 91%   BMI 28.62 kg/m   Physical Exam  Constitutional: She appears well-developed and well-nourished. No distress.  Well-appearing. Hoarse voice.  HENT:  Head: Normocephalic and atraumatic.  Cardiovascular: Normal rate, regular rhythm and normal heart sounds.  No  murmur heard. Pulmonary/Chest: Effort normal. No respiratory distress.  Crackles to left mid to upper lung field.  Abdominal: Soft. She exhibits no distension. There is tenderness.  Upper abdominal tenderness. Negative Murphy's.   Musculoskeletal:  No lower extremity edema.  Neurological: She is alert.  Skin: Skin is warm and dry.  Nursing note and vitals reviewed.    ED Treatments / Results  Labs (all labs ordered are listed, but only abnormal results are displayed) Labs Reviewed  CBC WITH DIFFERENTIAL/PLATELET - Abnormal; Notable for the following components:      Result Value  WBC 10.7 (*)    Neutro Abs 8.2 (*)    All other components within normal limits  COMPREHENSIVE METABOLIC PANEL - Abnormal; Notable for the following components:   Potassium 3.1 (*)    Calcium 8.6 (*)    Total Protein 6.4 (*)    Albumin 3.0 (*)    AST 57 (*)    ALT 80 (*)    Alkaline Phosphatase 134 (*)    All other components within normal limits  I-STAT TROPONIN, ED    EKG EKG Interpretation  Date/Time:  Saturday July 19 2017 19:48:42 EDT Ventricular Rate:  71 PR Interval:    QRS Duration: 96 QT Interval:  398 QTC Calculation: 433 R Axis:   -25 Text Interpretation:  Sinus rhythm Borderline left axis deviation siilar patern to prior 11/18 Confirmed by Aletta Edouard [phone]) on 07/19/2017 7:56:25 PM   Radiology Dg Chest 2 View  Result Date: 07/19/2017 CLINICAL DATA:  Cough and sore throat with loss of voice yesterday. EXAM: CHEST - 2 VIEW COMPARISON:  None. FINDINGS: Heart is top-normal in size with moderate aortic atherosclerosis. No aneurysm. Platelike atelectasis and/or scarring noted the left upper lobe with left basilar subsegmental atelectasis also noted. No confluent airspace disease suspicious for pneumonia. No dominant mass. Mild interstitial edema is seen. No alveolar consolidation, effusion or pneumothorax. Remote healed fracture deformity of the right humeral head and neck. No  acute osseous abnormality. IMPRESSION: 1. Platelike atelectasis and/or scarring in the left upper lobe. 2. Aortic atherosclerosis without aneurysm. 3. Mild interstitial edema. 4. Healed fracture deformity of the right proximal humerus. Electronically Signed   By: Ashley Royalty M.D.   On: 07/19/2017 18:55   US Abdomen Limited Ruq  Result Date: 07/19/2017 CLINICAL DATA:  Elevated liver enzymes EXAM: ULTRASOUND ABDOMEN LIMITED RIGHT UPPER QUADRANT COMPARISON:  None. FINDINGS: Gallbladder: Gallbladder nondistended. Several echogenic gallstones present measuring up to 5 mm. Small amount of sludge. No pericholecystic fluid. No gallbladder wall thickening. Negative sonographic Murphy's sign. Tenderness over gallbladder. Common bile duct: Diameter: Mildly dilated from 7-8 mm Liver: No focal lesion identified. Within normal limits in parenchymal echogenicity. Portal vein is patent on color Doppler imaging with normal direction of blood flow towards the liver. IMPRESSION: 1. Cholelithiasis and dilated common bile duct. 2. Tenderness over the gallbladder without frank Murphy sign. 3. Recommend correlation for cholecystitis or biliary obstruction. Electronically Signed   By: Suzy Bouchard M.D.   On: 07/19/2017 23:51    Procedures Procedures (including critical care time)  Medications Ordered in ED Medications  dextrose 5 % and 0.45 % NaCl with KCl 20 mEq/L infusion (has no administration in time range)  cefTRIAXone (ROCEPHIN) 2 g in sodium chloride 0.9 % 100 mL IVPB (has no administration in time range)  cefTRIAXone (ROCEPHIN) 2 g in sodium chloride 0.9 % 100 mL IVPB (has no administration in time range)     Initial Impression / Assessment and Plan / ED Course  I have reviewed the triage vital signs and the nursing notes.  Pertinent labs & imaging results that were available during my care of the patient were reviewed by me and considered in my medical decision making (see chart for details).  Clinical  Course as of Jul 21 47  Sat Jul 19, 2017  1935 Patient was sent here because her son thought she sounded hoarse on the phone.  Patient is complaining of some sore throat that just started today.  She denies any other illness symptoms and states she is  been eating and drinking okay.  She otherwise looks nontoxic.  And get a chest x-ray and some screening labs and likely will be sending her back to her facility.   [MB]    Clinical Course User Index [MB] Hayden Rasmussen, MD   JOYANN SPIDLE is a 82 y.o. female who presents to ED because son spoke with patient on the phone and didn't feel as if she sounded well. On exam today, she is non-toxic appearing, hemodynamically stable. Tenderness to upper abdomen. She has hx of prior TBI from facility and unable to tell me why she is in the ER and reports no complaints. CXR with no acute findings. Labs notable for mild leukocytosis of 10.7. She also has increase in liver enzymes. Per chart review, no hx of prior elevation. Given abdominal tenderness and lab abnormalities, ultrasound obtained showing cholelithiasis and dilated common bile duct. Consulted general surgery, Dr. Redmond Pulling, who will admit.  Patient seen by and discussed with Dr. Melina Copa who agrees with treatment plan.   Final Clinical Impressions(s) / ED Diagnoses   Final diagnoses:  Cough  Elevated liver enzymes    ED Discharge Orders    None       Jessly Lebeck, Ozella Almond, PA-C 07/20/17 0049    Hayden Rasmussen, MD 07/21/17 1134

## 2017-07-19 NOTE — ED Notes (Signed)
Patient transported to Ultrasound 

## 2017-07-19 NOTE — ED Triage Notes (Signed)
Pt from [facility] by [facility]. Pt son request pt be transferred here for check up because on the phone her voice didn't sound right. Staff at facility checked on pt and informed him her Vital Signs were normal and she had no complaints, but he still insisted her be transferred to the hospital

## 2017-07-20 DIAGNOSIS — K8010 Calculus of gallbladder with chronic cholecystitis without obstruction: Secondary | ICD-10-CM | POA: Diagnosis not present

## 2017-07-20 DIAGNOSIS — L8990 Pressure ulcer of unspecified site, unspecified stage: Secondary | ICD-10-CM

## 2017-07-20 DIAGNOSIS — K8000 Calculus of gallbladder with acute cholecystitis without obstruction: Secondary | ICD-10-CM | POA: Diagnosis present

## 2017-07-20 LAB — COMPREHENSIVE METABOLIC PANEL
ALT: 63 U/L — ABNORMAL HIGH (ref 14–54)
ANION GAP: 9 (ref 5–15)
AST: 39 U/L (ref 15–41)
Albumin: 2.5 g/dL — ABNORMAL LOW (ref 3.5–5.0)
Alkaline Phosphatase: 115 U/L (ref 38–126)
BUN: 7 mg/dL (ref 6–20)
CO2: 28 mmol/L (ref 22–32)
Calcium: 8.4 mg/dL — ABNORMAL LOW (ref 8.9–10.3)
Chloride: 104 mmol/L (ref 101–111)
Creatinine, Ser: 0.45 mg/dL (ref 0.44–1.00)
Glucose, Bld: 93 mg/dL (ref 65–99)
POTASSIUM: 2.9 mmol/L — AB (ref 3.5–5.1)
SODIUM: 141 mmol/L (ref 135–145)
Total Bilirubin: 1 mg/dL (ref 0.3–1.2)
Total Protein: 5.8 g/dL — ABNORMAL LOW (ref 6.5–8.1)

## 2017-07-20 LAB — CBC
HEMATOCRIT: 38.4 % (ref 36.0–46.0)
HEMOGLOBIN: 12 g/dL (ref 12.0–15.0)
MCH: 27.8 pg (ref 26.0–34.0)
MCHC: 31.3 g/dL (ref 30.0–36.0)
MCV: 88.9 fL (ref 78.0–100.0)
PLATELETS: 192 10*3/uL (ref 150–400)
RBC: 4.32 MIL/uL (ref 3.87–5.11)
RDW: 15.1 % (ref 11.5–15.5)
WBC: 7.8 10*3/uL (ref 4.0–10.5)

## 2017-07-20 LAB — MRSA PCR SCREENING: MRSA by PCR: NEGATIVE

## 2017-07-20 MED ORDER — GABAPENTIN 600 MG PO TABS
600.0000 mg | ORAL_TABLET | Freq: Three times a day (TID) | ORAL | Status: DC
  Administered 2017-07-20 – 2017-07-22 (×6): 600 mg via ORAL
  Filled 2017-07-20 (×6): qty 1

## 2017-07-20 MED ORDER — SENNA 8.6 MG PO TABS
1.0000 | ORAL_TABLET | Freq: Two times a day (BID) | ORAL | Status: DC
  Administered 2017-07-20 – 2017-07-22 (×4): 8.6 mg via ORAL
  Filled 2017-07-20 (×4): qty 1

## 2017-07-20 MED ORDER — ACETAMINOPHEN 325 MG PO TABS
650.0000 mg | ORAL_TABLET | Freq: Four times a day (QID) | ORAL | Status: DC | PRN
  Administered 2017-07-21: 650 mg via ORAL
  Filled 2017-07-20: qty 2

## 2017-07-20 MED ORDER — SODIUM CHLORIDE 0.9 % IV SOLN
2.0000 g | INTRAVENOUS | Status: DC
  Administered 2017-07-21 – 2017-07-22 (×2): 2 g via INTRAVENOUS
  Filled 2017-07-20 (×3): qty 20

## 2017-07-20 MED ORDER — OXYCODONE HCL 5 MG PO TABS
5.0000 mg | ORAL_TABLET | ORAL | Status: DC | PRN
  Administered 2017-07-21 – 2017-07-22 (×3): 10 mg via ORAL
  Filled 2017-07-20 (×3): qty 2

## 2017-07-20 MED ORDER — ONDANSETRON HCL 4 MG/2ML IJ SOLN: 4.0000 mg | Freq: Four times a day (QID) | INTRAMUSCULAR | Status: DC | PRN

## 2017-07-20 MED ORDER — ENOXAPARIN SODIUM 40 MG/0.4ML ~~LOC~~ SOLN
40.0000 mg | SUBCUTANEOUS | Status: DC
  Administered 2017-07-21: 40 mg via SUBCUTANEOUS
  Filled 2017-07-20: qty 0.4

## 2017-07-20 MED ORDER — POTASSIUM CHLORIDE 10 MEQ/100ML IV SOLN
10.0000 meq | INTRAVENOUS | Status: AC
  Administered 2017-07-20 (×2): 10 meq via INTRAVENOUS
  Filled 2017-07-20 (×2): qty 100

## 2017-07-20 MED ORDER — ACETAMINOPHEN 650 MG RE SUPP: 650.0000 mg | Freq: Four times a day (QID) | RECTAL | Status: DC | PRN

## 2017-07-20 MED ORDER — ALPRAZOLAM 0.5 MG PO TABS: 0.5000 mg | ORAL_TABLET | Freq: Two times a day (BID) | ORAL | Status: DC | PRN

## 2017-07-20 MED ORDER — KCL IN DEXTROSE-NACL 20-5-0.45 MEQ/L-%-% IV SOLN
INTRAVENOUS | Status: DC
Start: 2017-07-20 — End: 2017-07-22
  Administered 2017-07-20 – 2017-07-22 (×6): via INTRAVENOUS
  Filled 2017-07-20 (×6): qty 1000

## 2017-07-20 MED ORDER — SODIUM CHLORIDE 0.9 % IV SOLN
2.0000 g | Freq: Once | INTRAVENOUS | Status: AC
  Administered 2017-07-20: 2 g via INTRAVENOUS
  Filled 2017-07-20: qty 20

## 2017-07-20 MED ORDER — ESCITALOPRAM OXALATE 10 MG PO TABS
5.0000 mg | ORAL_TABLET | Freq: Every day | ORAL | Status: DC
  Administered 2017-07-20 – 2017-07-22 (×3): 5 mg via ORAL
  Filled 2017-07-20 (×3): qty 1

## 2017-07-20 MED ORDER — POTASSIUM CHLORIDE CRYS ER 20 MEQ PO TBCR
60.0000 meq | EXTENDED_RELEASE_TABLET | Freq: Once | ORAL | Status: AC
  Administered 2017-07-20: 60 meq via ORAL
  Filled 2017-07-20: qty 3

## 2017-07-20 MED ORDER — METHOCARBAMOL 500 MG PO TABS
1000.0000 mg | ORAL_TABLET | Freq: Three times a day (TID) | ORAL | Status: DC | PRN
  Administered 2017-07-21 (×2): 1000 mg via ORAL
  Filled 2017-07-20 (×2): qty 2

## 2017-07-20 MED ORDER — LATANOPROST 0.005 % OP SOLN
1.0000 [drp] | Freq: Every day | OPHTHALMIC | Status: DC
  Administered 2017-07-20 – 2017-07-21 (×2): 1 [drp] via OPHTHALMIC
  Filled 2017-07-20: qty 2.5

## 2017-07-20 MED ORDER — ONDANSETRON 4 MG PO TBDP: 4.0000 mg | ORAL_TABLET | Freq: Four times a day (QID) | ORAL | Status: DC | PRN

## 2017-07-20 NOTE — Progress Notes (Signed)
Patient ID: Autumn Bryant, female   DOB: [birth year], 82 y.o.   MRN: [id number]     Subjective: She denies abdominal pain this morning.  Objective: Vital signs in last 24 hours: Temp:  [97.3 F (36.3 C)-98.6 F (37 C)] 97.3 F (36.3 C) (06/16 0542) Pulse Rate:  [63-80] 69 (06/16 0542) Resp:  [13-21] 18 (06/16 0542) BP: (104-183)/(57-98) 139/73 (06/16 0542) SpO2:  [82 %-100 %] 100 % (06/16 0542) Weight:  [78 kg (172 lb)] 78 kg (172 lb) (06/15 1802) Last BM Date: 07/20/17  Intake/Output from previous day: 06/15 0701 - 06/16 0700 In: 350 [I.V.:300; IV Piggyback:50] Out: -  Intake/Output this shift: Total I/O In: -  Out: 300 [Urine:300]  General appearance: alert, cooperative and Pleasantly confused Resp: clear to auscultation bilaterally GI: normal findings: soft, non-tender Neurologic: She is alert and pleasantly conversant but oriented only to being in the hospital and not to date or situation  Lab Results:  Recent Labs    07/19/17 2107 07/20/17 0537  WBC 10.7* 7.8  HGB 12.9 12.0  HCT 41.9 38.4  PLT 181 192   BMET Recent Labs    07/19/17 2107 07/20/17 0537  NA 143 141  K 3.1* 2.9*  CL 101 104  CO2 29 28  GLUCOSE 92 93  BUN 10 7  CREATININE 0.58 0.45  CALCIUM 8.6* 8.4*     Studies/Results: Dg Chest 2 View  Result Date: 07/19/2017 CLINICAL DATA:  Cough and sore throat with loss of voice yesterday. EXAM: CHEST - 2 VIEW COMPARISON:  None. FINDINGS: Heart is top-normal in size with moderate aortic atherosclerosis. No aneurysm. Platelike atelectasis and/or scarring noted the left upper lobe with left basilar subsegmental atelectasis also noted. No confluent airspace disease suspicious for pneumonia. No dominant mass. Mild interstitial edema is seen. No alveolar consolidation, effusion or pneumothorax. Remote healed fracture deformity of the right humeral head and neck. No acute osseous abnormality. IMPRESSION: 1. Platelike atelectasis and/or scarring in the left  upper lobe. 2. Aortic atherosclerosis without aneurysm. 3. Mild interstitial edema. 4. Healed fracture deformity of the right proximal humerus. Electronically Signed   By: Ashley Royalty M.D.   On: 07/19/2017 18:55   US Abdomen Limited Ruq  Result Date: 07/19/2017 CLINICAL DATA:  Elevated liver enzymes EXAM: ULTRASOUND ABDOMEN LIMITED RIGHT UPPER QUADRANT COMPARISON:  None. FINDINGS: Gallbladder: Gallbladder nondistended. Several echogenic gallstones present measuring up to 5 mm. Small amount of sludge. No pericholecystic fluid. No gallbladder wall thickening. Negative sonographic Murphy's sign. Tenderness over gallbladder. Common bile duct: Diameter: Mildly dilated from 7-8 mm Liver: No focal lesion identified. Within normal limits in parenchymal echogenicity. Portal vein is patent on color Doppler imaging with normal direction of blood flow towards the liver. IMPRESSION: 1. Cholelithiasis and dilated common bile duct. 2. Tenderness over the gallbladder without frank Murphy sign. 3. Recommend correlation for cholecystitis or biliary obstruction. Electronically Signed   By: Suzy Bouchard M.D.   On: 07/19/2017 23:51    Anti-infectives: Anti-infectives (From admission, onward)   Start     Dose/Rate Route Frequency Ordered Stop   07/21/17 0000  cefTRIAXone (ROCEPHIN) 2 g in sodium chloride 0.9 % 100 mL IVPB     2 g 200 mL/hr over 30 Minutes Intravenous Every 24 hours 07/20/17 0034     07/20/17 0045  cefTRIAXone (ROCEPHIN) 2 g in sodium chloride 0.9 % 100 mL IVPB     2 g 200 mL/hr over 30 Minutes Intravenous  Once 07/20/17 0042 07/20/17 0306  Assessment/Plan: Cholelithiasis with biliary colic or possible early acute cholecystitis.  Today she is nontender and white blood count normalized. Hypokalemia, replacement ordered and repeat lab ordered for a.m. I discussed the situation with the patient's son.  Although she has significant sequela I from her subdural hematoma no other major medical  problems that would preclude surgery.  I think the best course for her would be laparoscopic cholecystectomy to prevent further episodes or complications from her gallbladder.  I discussed alternatives and the nature of surgery as well as risks of anesthetic complications, bleeding, infection, or injury to surrounding structures.  All his questions were answered and he is amenable to surgery.  He will be in late today and we will plan surgery for tomorrow morning.  Discussed with patient as well although  she has limited understanding    LOS: 0 days    Edward Jolly 07/20/2017

## 2017-07-20 NOTE — ED Notes (Signed)
Purewick placed, pt had large amount of greenish brown discharge upon diaper change.

## 2017-07-20 NOTE — H&P (Signed)
Autumn Bryant is an 82 y.o. female.   Chief Complaint: stomach pain HPI: 82 year old female who resides in a nursing home with a past medical history of traumatic brain injury was brought to the emergency room for evaluation at the request of her son because she did not reportedly sound right on the phone.  He was concerned that her voice was hoarse.  In the emergency room she was found to have upper abdominal pain, mildly elevated transaminases . She states she had stomach pain but no n/v/f/c. She doesn't know year, month, city or location. No other history is obtainable.   Past Medical History:  Diagnosis Date  . Anasarca 05/21/2016  . Anxiety 11/03/2016  . Closed TBI (traumatic brain injury) ([place]) 04/21/2016  . Dysphagia 07/13/2016  . Gout   . Hypertension   . Obesity (BMI 30.0-34.9) 05/21/2016  . Status post insertion of percutaneous endoscopic gastrostomy (PEG) tube ([place]) 04/21/2016    Past Surgical History:  Procedure Laterality Date  . INTRAMEDULLARY (IM) NAIL INTERTROCHANTERIC Right 12/27/2016   Procedure: INTRAMEDULLARY (IM) NAIL INTERTROCHANTRIC RIGHT HIP;  Surgeon: Paralee Cancel, MD;  Location: [facility];  Service: Orthopedics;  Laterality: Right;  . MASTECTOMY      Family History  Problem Relation Age of Onset  . Asthma Mother   . Anesthesia problems Neg Hx   . Arthritis Neg Hx   . Cancer Neg Hx   . Cerebral palsy Neg Hx   . Clotting disorder Neg Hx   . Club foot Neg Hx   . Collagen disease Neg Hx   . Deep vein thrombosis Neg Hx   . Gait disorder Neg Hx   . Heart disease Neg Hx   . Gout Neg Hx   . Diabetes Neg Hx   . Hip dysplasia Neg Hx   . Hip fracture Neg Hx   . Hypermobility Neg Hx   . Hypertension Neg Hx   . Osteoporosis Neg Hx   . Other Neg Hx   . Stroke Neg Hx   . Thyroid disease Neg Hx   . Vasculitis Neg Hx   . Spina bifida Neg Hx   . Scoliosis Neg Hx   . Rheumatologic disease Neg Hx   . Pulmonary embolism Neg Hx    Social History:  reports that she has  never smoked. She has never used smokeless tobacco. She reports that she does not drink alcohol or use drugs.  Allergies:  Allergies  Allergen Reactions  . Demerol [Meperidine] Swelling    Medications Prior to Admission  Medication Sig Dispense Refill  . ALPRAZolam (XANAX) 0.5 MG tablet Take 0.5 mg by mouth 2 (two) times daily.    . bimatoprost (LUMIGAN) 0.01 % SOLN Place 1 drop into both eyes at bedtime. For glaucoma    . bisacodyl (DULCOLAX) 10 MG suppository Place 10 mg rectally daily as needed (constipation not relieved by MOM).    . ENSURE (ENSURE) Take 237 mLs by mouth 3 (three) times daily between meals.    Marland Kitchen escitalopram (LEXAPRO) 5 MG tablet Take 5 mg by mouth daily.    . furosemide (LASIX) 20 MG tablet Take 20 mg by mouth daily.    Marland Kitchen gabapentin (NEURONTIN) 600 MG tablet Take 600 mg by mouth 3 (three) times daily.    . Magnesium Hydroxide (MILK OF MAGNESIA PO) Take 30 mLs by mouth daily as needed (if no BM in 3 days).    . methocarbamol (ROBAXIN) 500 MG tablet Take 1,000 mg by mouth  3 (three) times daily. For  muscle spasms    . oxyCODONE-acetaminophen (PERCOCET) 10-325 MG tablet Take 1 tablet by mouth every 8 (eight) hours.    . sennosides-docusate sodium (SENOKOT-S) 8.6-50 MG tablet Take 2 tablets by mouth 2 (two) times daily as needed for constipation.    . Sodium Phosphates (FLEET ENEMA RE) Place 1 each rectally daily as needed (constipation not relieved by bisacodyl suppository).    . cefTRIAXone (ROCEPHIN) 1 g injection Inject 1 g into the muscle See admin instructions. Order date 07/17/17, stop date 07/19/17      Results for orders placed or performed during the hospital encounter of 07/19/17 (from the past 48 hour(s))  CBC with Differential     Status: Abnormal   Collection Time: 07/19/17  9:07 PM  Result Value Ref Range   WBC 10.7 (H) 4.0 - 10.5 K/uL   RBC 4.67 3.87 - 5.11 MIL/uL   Hemoglobin 12.9 12.0 - 15.0 g/dL   HCT 41.9 36.0 - 46.0 %   MCV 89.7 78.0 - 100.0 fL    MCH 27.6 26.0 - 34.0 pg   MCHC 30.8 30.0 - 36.0 g/dL   RDW 15.4 11.5 - 15.5 %   Platelets 181 150 - 400 K/uL   Neutrophils Relative % 76 %   Neutro Abs 8.2 (H) 1.7 - 7.7 K/uL   Lymphocytes Relative 18 %   Lymphs Abs 1.9 0.7 - 4.0 K/uL   Monocytes Relative 4 %   Monocytes Absolute 0.4 0.1 - 1.0 K/uL   Eosinophils Relative 1 %   Eosinophils Absolute 0.1 0.0 - 0.7 K/uL   Basophils Relative 1 %   Basophils Absolute 0.1 0.0 - 0.1 K/uL   Immature Granulocytes 0 %   Abs Immature Granulocytes 0.0 0.0 - 0.1 K/uL    Comment: Performed at [facility], [address], [place], [place] [address]  Comprehensive metabolic panel     Status: Abnormal   Collection Time: 07/19/17  9:07 PM  Result Value Ref Range   Sodium 143 135 - 145 mmol/L   Potassium 3.1 (L) 3.5 - 5.1 mmol/L   Chloride 101 101 - 111 mmol/L   CO2 29 22 - 32 mmol/L   Glucose, Bld 92 65 - 99 mg/dL   BUN 10 6 - 20 mg/dL   Creatinine, Ser 0.58 0.44 - 1.00 mg/dL   Calcium 8.6 (L) 8.9 - 10.3 mg/dL   Total Protein 6.4 (L) 6.5 - 8.1 g/dL   Albumin 3.0 (L) 3.5 - 5.0 g/dL   AST 57 (H) 15 - 41 U/L   ALT 80 (H) 14 - 54 U/L   Alkaline Phosphatase 134 (H) 38 - 126 U/L   Total Bilirubin 1.2 0.3 - 1.2 mg/dL   GFR calc non Af Amer >60 >60 mL/min   GFR calc Af Amer >60 >60 mL/min    Comment: (NOTE) The eGFR has been calculated using the CKD EPI equation. This calculation has not been validated in all clinical situations. eGFR's persistently <60 mL/min signify possible Chronic Kidney Disease.    Anion gap 13 5 - 15    Comment: Performed at [facility] [address], [place],  [address]  I-stat troponin, ED     Status: None   Collection Time: 07/19/17  9:15 PM  Result Value Ref Range   Troponin i, poc 0.00 0.00 - 0.08 ng/mL   Comment 3            Comment: Due  to the release kinetics of cTnI, a negative result within the first hours of the onset of symptoms does not rule out myocardial infarction with  certainty. If myocardial infarction is still suspected, repeat the test at appropriate intervals.   MRSA PCR Screening     Status: None   Collection Time: 07/20/17  4:18 AM  Result Value Ref Range   MRSA by PCR NEGATIVE NEGATIVE    Comment:        The GeneXpert MRSA Assay (FDA approved for NASAL specimens only), is one component of a comprehensive MRSA colonization surveillance program. It is not intended to diagnose MRSA infection nor to guide or monitor treatment for MRSA infections. Performed at [facility], [place] [address], [place], [place] [address]   CBC     Status: None   Collection Time: 07/20/17  5:37 AM  Result Value Ref Range   WBC 7.8 4.0 - 10.5 K/uL   RBC 4.32 3.87 - 5.11 MIL/uL   Hemoglobin 12.0 12.0 - 15.0 g/dL   HCT 38.4 36.0 - 46.0 %   MCV 88.9 78.0 - 100.0 fL   MCH 27.8 26.0 - 34.0 pg   MCHC 31.3 30.0 - 36.0 g/dL   RDW 15.1 11.5 - 15.5 %   Platelets 192 150 - 400 K/uL    Comment: Performed at [facility], [place] [address] [place], [place] [address]   Dg Chest 2 View  Result Date: 07/19/2017 CLINICAL DATA:  Cough and sore throat with loss of voice yesterday. EXAM: CHEST - 2 VIEW COMPARISON:  None. FINDINGS: Heart is top-normal in size with moderate aortic atherosclerosis. No aneurysm. Platelike atelectasis and/or scarring noted the left upper lobe with left basilar subsegmental atelectasis also noted. No confluent airspace disease suspicious for pneumonia. No dominant mass. Mild interstitial edema is seen. No alveolar consolidation, effusion or pneumothorax. Remote healed fracture deformity of the right humeral head and neck. No acute osseous abnormality. IMPRESSION: 1. Platelike atelectasis and/or scarring in the left upper lobe. 2. Aortic atherosclerosis without aneurysm. 3. Mild interstitial edema. 4. Healed fracture deformity of the right proximal humerus. Electronically Signed   By: Ashley Royalty M.D.   On: 07/19/2017 18:55   US Abdomen Limited  Ruq  Result Date: 07/19/2017 CLINICAL DATA:  Elevated liver enzymes EXAM: ULTRASOUND ABDOMEN LIMITED RIGHT UPPER QUADRANT COMPARISON:  None. FINDINGS: Gallbladder: Gallbladder nondistended. Several echogenic gallstones present measuring up to 5 mm. Small amount of sludge. No pericholecystic fluid. No gallbladder wall thickening. Negative sonographic Murphy's sign. Tenderness over gallbladder. Common bile duct: Diameter: Mildly dilated from 7-8 mm Liver: No focal lesion identified. Within normal limits in parenchymal echogenicity. Portal vein is patent on color Doppler imaging with normal direction of blood flow towards the liver. IMPRESSION: 1. Cholelithiasis and dilated common bile duct. 2. Tenderness over the gallbladder without frank Murphy sign. 3. Recommend correlation for cholecystitis or biliary obstruction. Electronically Signed   By: Suzy Bouchard M.D.   On: 07/19/2017 23:51    Review of Systems  Unable to perform ROS: Mental acuity    Blood pressure 139/73, pulse 69, temperature (!) 97.3 F (36.3 C), temperature source Oral, resp. rate 18, height 5' 5" (1.651 m), weight 78 kg (172 lb), SpO2 100 %. Physical Exam  Vitals reviewed. Constitutional: She appears well-developed and well-nourished. No distress.  HENT:  Head: Normocephalic and atraumatic.  Right Ear: External ear normal.  Left Ear: External ear normal.  Eyes: Conjunctivae are normal. No scleral icterus.  Neck: Normal range of motion. Neck supple. No tracheal deviation present. No thyromegaly present.  Cardiovascular: Normal rate and normal heart sounds.  Respiratory: Effort normal and breath sounds normal. No stridor. No respiratory distress. She has no wheezes.  GI: Soft. She exhibits no distension. There is tenderness in the right upper quadrant and epigastric area. There is no rigidity, no rebound and no guarding.  Old peg site  Musculoskeletal: She exhibits no edema or tenderness.  Lymphadenopathy:    She has no  cervical adenopathy.  Neurological: She is alert. She exhibits normal muscle tone. GCS eye subscore is 4. GCS verbal subscore is 4. GCS motor subscore is 6.  Follows commands; pleasant; not oriented  Skin: Skin is warm and dry. No rash noted. She is not diaphoretic. No erythema. No pallor.  Skin tears b/l foot  Psychiatric: She has a normal mood and affect. Her speech is normal and behavior is normal. Judgment and thought content normal. Cognition and memory are impaired.     Assessment/Plan Cholecystitis with calculus History of TBI Obesity Hypertension  Given her clinical exam, radiological findings, and initial labs it is consistent with symptomatic cholelithiasis and possibly cholecystitis  The ER attempted to reach the patient's son as did I just now and I could not reach him.  I left a voicemail message  She is not competent in my opinion to make medical decisions or to understand informed consent  We will attempt to reach the son again later today  In the interim continue antibiotics for cholecystitis, follow-up labs, continue home meds  Leighton Ruff. Redmond Pulling, MD, FACS General, Bariatric, & Minimally Invasive Surgery [facility], [place]    Greer Pickerel, MD 07/20/2017, 6:52 AM

## 2017-07-21 ENCOUNTER — Observation Stay (HOSPITAL_COMMUNITY): Payer: Medicare Other | Admitting: Certified Registered Nurse Anesthetist

## 2017-07-21 ENCOUNTER — Encounter (HOSPITAL_COMMUNITY)
Admission: EM | Disposition: A | Discharge status: Skilled Nursing Facility | Payer: Self-pay | Source: Home / Self Care | Attending: Emergency Medicine

## 2017-07-21 ENCOUNTER — Encounter (HOSPITAL_COMMUNITY): Payer: Self-pay | Admitting: Orthopedic Surgery

## 2017-07-21 DIAGNOSIS — G9340 Encephalopathy, unspecified: Secondary | ICD-10-CM | POA: Diagnosis not present

## 2017-07-21 DIAGNOSIS — I10 Essential (primary) hypertension: Secondary | ICD-10-CM | POA: Diagnosis not present

## 2017-07-21 DIAGNOSIS — Z96641 Presence of right artificial hip joint: Secondary | ICD-10-CM | POA: Diagnosis not present

## 2017-07-21 DIAGNOSIS — F329 Major depressive disorder, single episode, unspecified: Secondary | ICD-10-CM | POA: Diagnosis not present

## 2017-07-21 DIAGNOSIS — K8000 Calculus of gallbladder with acute cholecystitis without obstruction: Secondary | ICD-10-CM | POA: Diagnosis not present

## 2017-07-21 DIAGNOSIS — K8010 Calculus of gallbladder with chronic cholecystitis without obstruction: Secondary | ICD-10-CM | POA: Diagnosis not present

## 2017-07-21 DIAGNOSIS — R490 Dysphonia: Secondary | ICD-10-CM | POA: Diagnosis not present

## 2017-07-21 DIAGNOSIS — Z885 Allergy status to narcotic agent status: Secondary | ICD-10-CM | POA: Diagnosis not present

## 2017-07-21 DIAGNOSIS — G629 Polyneuropathy, unspecified: Secondary | ICD-10-CM | POA: Diagnosis not present

## 2017-07-21 DIAGNOSIS — R748 Abnormal levels of other serum enzymes: Secondary | ICD-10-CM | POA: Diagnosis not present

## 2017-07-21 DIAGNOSIS — I700 Atherosclerosis of aorta: Secondary | ICD-10-CM | POA: Diagnosis not present

## 2017-07-21 DIAGNOSIS — F419 Anxiety disorder, unspecified: Secondary | ICD-10-CM | POA: Diagnosis not present

## 2017-07-21 DIAGNOSIS — Z87820 Personal history of traumatic brain injury: Secondary | ICD-10-CM | POA: Diagnosis not present

## 2017-07-21 DIAGNOSIS — H409 Unspecified glaucoma: Secondary | ICD-10-CM | POA: Diagnosis not present

## 2017-07-21 HISTORY — PX: CHOLECYSTECTOMY: SHX55

## 2017-07-21 SURGERY — LAPAROSCOPIC CHOLECYSTECTOMY WITH INTRAOPERATIVE CHOLANGIOGRAM
Anesthesia: General | Site: Abdomen

## 2017-07-21 MED ORDER — GLYCOPYRROLATE PF 0.2 MG/ML IJ SOSY
PREFILLED_SYRINGE | INTRAMUSCULAR | Status: AC
  Filled 2017-07-21: qty 1

## 2017-07-21 MED ORDER — SODIUM CHLORIDE 0.9 % IR SOLN
Status: DC | PRN
  Administered 2017-07-21: 1000 mL

## 2017-07-21 MED ORDER — 0.9 % SODIUM CHLORIDE (POUR BTL) OPTIME
TOPICAL | Status: DC | PRN
  Administered 2017-07-21: 1000 mL

## 2017-07-21 MED ORDER — BUPIVACAINE-EPINEPHRINE 0.25% -1:200000 IJ SOLN
INTRAMUSCULAR | Status: DC | PRN
  Administered 2017-07-21: 9 mL

## 2017-07-21 MED ORDER — EPHEDRINE SULFATE 50 MG/ML IJ SOLN
INTRAMUSCULAR | Status: DC | PRN
  Administered 2017-07-21: 10 mg via INTRAVENOUS

## 2017-07-21 MED ORDER — PROPOFOL 10 MG/ML IV BOLUS
INTRAVENOUS | Status: DC | PRN
  Administered 2017-07-21: 100 mg via INTRAVENOUS

## 2017-07-21 MED ORDER — SUGAMMADEX SODIUM 200 MG/2ML IV SOLN
INTRAVENOUS | Status: DC | PRN
  Administered 2017-07-21: 150 mg via INTRAVENOUS

## 2017-07-21 MED ORDER — ONDANSETRON HCL 4 MG/2ML IJ SOLN
INTRAMUSCULAR | Status: DC | PRN
  Administered 2017-07-21: 4 mg via INTRAVENOUS

## 2017-07-21 MED ORDER — BUPIVACAINE-EPINEPHRINE (PF) 0.25% -1:200000 IJ SOLN
INTRAMUSCULAR | Status: AC
  Filled 2017-07-21: qty 30

## 2017-07-21 MED ORDER — EPHEDRINE SULFATE 50 MG/ML IJ SOLN
INTRAMUSCULAR | Status: AC
  Filled 2017-07-21: qty 1

## 2017-07-21 MED ORDER — ONDANSETRON HCL 4 MG/2ML IJ SOLN
INTRAMUSCULAR | Status: AC
  Filled 2017-07-21: qty 2

## 2017-07-21 MED ORDER — DEXAMETHASONE SODIUM PHOSPHATE 10 MG/ML IJ SOLN
INTRAMUSCULAR | Status: DC | PRN
  Administered 2017-07-21: 5 mg via INTRAVENOUS

## 2017-07-21 MED ORDER — SUGAMMADEX SODIUM 200 MG/2ML IV SOLN
INTRAVENOUS | Status: AC
  Filled 2017-07-21: qty 2

## 2017-07-21 MED ORDER — LACTATED RINGERS IV SOLN
INTRAVENOUS | Status: DC
Start: 2017-07-21 — End: 2017-07-22
  Administered 2017-07-21: 12:00:00 via INTRAVENOUS

## 2017-07-21 MED ORDER — SUCCINYLCHOLINE CHLORIDE 200 MG/10ML IV SOSY
PREFILLED_SYRINGE | INTRAVENOUS | Status: AC
  Filled 2017-07-21: qty 10

## 2017-07-21 MED ORDER — PHENYLEPHRINE HCL 10 MG/ML IJ SOLN
INTRAMUSCULAR | Status: DC | PRN
  Administered 2017-07-21: 120 ug via INTRAVENOUS

## 2017-07-21 MED ORDER — PROPOFOL 10 MG/ML IV BOLUS
INTRAVENOUS | Status: AC
  Filled 2017-07-21: qty 20

## 2017-07-21 MED ORDER — IOPAMIDOL (ISOVUE-300) INJECTION 61%
INTRAVENOUS | Status: AC
  Filled 2017-07-21: qty 50

## 2017-07-21 MED ORDER — PHENYLEPHRINE 40 MCG/ML (10ML) SYRINGE FOR IV PUSH (FOR BLOOD PRESSURE SUPPORT)
PREFILLED_SYRINGE | INTRAVENOUS | Status: AC
  Filled 2017-07-21: qty 10

## 2017-07-21 MED ORDER — FENTANYL CITRATE (PF) 250 MCG/5ML IJ SOLN
INTRAMUSCULAR | Status: AC
  Filled 2017-07-21: qty 5

## 2017-07-21 MED ORDER — PROMETHAZINE HCL 25 MG/ML IJ SOLN: 6.2500 mg | INTRAMUSCULAR | Status: DC | PRN

## 2017-07-21 MED ORDER — FENTANYL CITRATE (PF) 100 MCG/2ML IJ SOLN
INTRAMUSCULAR | Status: DC | PRN
  Administered 2017-07-21: 100 ug via INTRAVENOUS
  Administered 2017-07-21: 50 ug via INTRAVENOUS

## 2017-07-21 MED ORDER — KETOROLAC TROMETHAMINE 15 MG/ML IJ SOLN
INTRAMUSCULAR | Status: AC
  Filled 2017-07-21: qty 1

## 2017-07-21 MED ORDER — LACTATED RINGERS IV SOLN
INTRAVENOUS | Status: DC | PRN
  Administered 2017-07-21: 13:00:00 via INTRAVENOUS

## 2017-07-21 MED ORDER — DEXAMETHASONE SODIUM PHOSPHATE 10 MG/ML IJ SOLN
INTRAMUSCULAR | Status: AC
  Filled 2017-07-21: qty 1

## 2017-07-21 MED ORDER — SUCCINYLCHOLINE CHLORIDE 20 MG/ML IJ SOLN
INTRAMUSCULAR | Status: DC | PRN
  Administered 2017-07-21: 100 mg via INTRAVENOUS

## 2017-07-21 MED ORDER — KETOROLAC TROMETHAMINE 15 MG/ML IJ SOLN
15.0000 mg | Freq: Once | INTRAMUSCULAR | Status: AC
  Administered 2017-07-21: 15 mg via INTRAVENOUS

## 2017-07-21 MED ORDER — LIDOCAINE HCL (CARDIAC) PF 100 MG/5ML IV SOSY
PREFILLED_SYRINGE | INTRAVENOUS | Status: DC | PRN
  Administered 2017-07-21: 100 mg via INTRAVENOUS

## 2017-07-21 MED ORDER — GLYCOPYRROLATE 0.2 MG/ML IJ SOLN
INTRAMUSCULAR | Status: DC | PRN
  Administered 2017-07-21: 0.2 mg via INTRAVENOUS

## 2017-07-21 MED ORDER — ROCURONIUM BROMIDE 100 MG/10ML IV SOLN
INTRAVENOUS | Status: DC | PRN
  Administered 2017-07-21: 10 mg via INTRAVENOUS
  Administered 2017-07-21: 40 mg via INTRAVENOUS

## 2017-07-21 SURGICAL SUPPLY — 43 items
APPLIER CLIP ROT 10 11.4 M/L (STAPLE) ×3
BENZOIN TINCTURE PRP APPL 2/3 (GAUZE/BANDAGES/DRESSINGS) ×3 IMPLANT
CANISTER SUCT 3000ML PPV (MISCELLANEOUS) ×3 IMPLANT
CHLORAPREP W/TINT 26ML (MISCELLANEOUS) ×3 IMPLANT
CLIP APPLIE ROT 10 11.4 M/L (STAPLE) ×1 IMPLANT
CLOSURE WOUND 1/2 X4 (GAUZE/BANDAGES/DRESSINGS) ×1
COVER MAYO STAND STRL (DRAPES) ×3 IMPLANT
COVER SURGICAL LIGHT HANDLE (MISCELLANEOUS) ×3 IMPLANT
DERMABOND ADVANCED (GAUZE/BANDAGES/DRESSINGS) ×2
DERMABOND ADVANCED .7 DNX12 (GAUZE/BANDAGES/DRESSINGS) IMPLANT
DRAPE C-ARM 42X72 X-RAY (DRAPES) ×1 IMPLANT
DRSG TEGADERM 4X4.75 (GAUZE/BANDAGES/DRESSINGS) ×3 IMPLANT
ELECT REM PT RETURN 9FT ADLT (ELECTROSURGICAL) ×3
ELECTRODE REM PT RTRN 9FT ADLT (ELECTROSURGICAL) ×1 IMPLANT
FILTER SMOKE EVAC LAPAROSHD (FILTER) ×1 IMPLANT
GAUZE SPONGE 2X2 8PLY STRL LF (GAUZE/BANDAGES/DRESSINGS) ×1 IMPLANT
GLOVE BIO SURGEON STRL SZ7 (GLOVE) ×3 IMPLANT
GLOVE BIOGEL PI IND STRL 7.5 (GLOVE) ×1 IMPLANT
GLOVE BIOGEL PI INDICATOR 7.5 (GLOVE) ×2
GOWN STRL REUS W/ TWL LRG LVL3 (GOWN DISPOSABLE) ×3 IMPLANT
GOWN STRL REUS W/TWL LRG LVL3 (GOWN DISPOSABLE) ×6
KIT BASIN OR (CUSTOM PROCEDURE TRAY) ×3 IMPLANT
KIT TURNOVER KIT B (KITS) ×3 IMPLANT
NS IRRIG 1000ML POUR BTL (IV SOLUTION) ×3 IMPLANT
PAD ARMBOARD 7.5X6 YLW CONV (MISCELLANEOUS) ×5 IMPLANT
POUCH SPECIMEN RETRIEVAL 10MM (ENDOMECHANICALS) ×2 IMPLANT
SCISSORS LAP 5X35 DISP (ENDOMECHANICALS) ×3 IMPLANT
SET CHOLANGIOGRAPH 5 50 .035 (SET/KITS/TRAYS/PACK) ×3 IMPLANT
SET IRRIG TUBING LAPAROSCOPIC (IRRIGATION / IRRIGATOR) ×3 IMPLANT
SLEEVE ENDOPATH XCEL 5M (ENDOMECHANICALS) ×3 IMPLANT
SPECIMEN JAR SMALL (MISCELLANEOUS) ×3 IMPLANT
SPONGE GAUZE 2X2 STER 10/PKG (GAUZE/BANDAGES/DRESSINGS) ×2
STRIP CLOSURE SKIN 1/2X4 (GAUZE/BANDAGES/DRESSINGS) ×2 IMPLANT
SUT MNCRL AB 4-0 PS2 18 (SUTURE) ×3 IMPLANT
SUT VICRYL 0 UR6 27IN ABS (SUTURE) ×4 IMPLANT
TOWEL OR 17X24 6PK STRL BLUE (TOWEL DISPOSABLE) ×3 IMPLANT
TOWEL OR 17X26 10 PK STRL BLUE (TOWEL DISPOSABLE) ×3 IMPLANT
TRAY LAPAROSCOPIC MC (CUSTOM PROCEDURE TRAY) ×3 IMPLANT
TROCAR XCEL BLUNT TIP 100MML (ENDOMECHANICALS) ×3 IMPLANT
TROCAR XCEL NON-BLD 11X100MML (ENDOMECHANICALS) ×3 IMPLANT
TROCAR XCEL NON-BLD 5MMX100MML (ENDOMECHANICALS) ×3 IMPLANT
TUBING INSUFFLATION (TUBING) ×3 IMPLANT
WATER STERILE IRR 1000ML POUR (IV SOLUTION) ×3 IMPLANT

## 2017-07-21 NOTE — Social Work (Signed)
CSW aware pt is LTC at [facility]. Will be following to support pt transfer when medically appropriate.   Alexander Mt, [place] Work [phone]

## 2017-07-21 NOTE — Progress Notes (Signed)
Day of Surgery   Subjective/Chief Complaint: abdominal pain Pt without complaints  Minimally verbal    Objective: Vital signs in last 24 hours: Temp:  [97.5 F (36.4 C)-98.6 F (37 C)] 97.9 F (36.6 C) (06/17 0453) Pulse Rate:  [60-77] 60 (06/17 0453) Resp:  [16-18] 16 (06/17 0453) BP: (128-137)/(67-73) 137/70 (06/17 0453) SpO2:  [95 %-100 %] 98 % (06/17 0453) Last BM Date: 07/20/17  Intake/Output from previous day: 06/16 0701 - 06/17 0700 In: 1248.3 [P.O.:320; I.V.:685; IV Piggyback:243.3] Out: 900 [Urine:900] Intake/Output this shift: No intake/output data recorded.  General appearance: appears older than stated age GI: soft, non-tender; bowel sounds normal; no masses,  no organomegaly Extremities: extremities normal, atraumatic, no cyanosis or edema  Lab Results:  Recent Labs    07/19/17 2107 07/20/17 0537  WBC 10.7* 7.8  HGB 12.9 12.0  HCT 41.9 38.4  PLT 181 192   BMET Recent Labs    07/19/17 2107 07/20/17 0537  NA 143 141  K 3.1* 2.9*  CL 101 104  CO2 29 28  GLUCOSE 92 93  BUN 10 7  CREATININE 0.58 0.45  CALCIUM 8.6* 8.4*   PT/INR No results for input(s): LABPROT, INR in the last 72 hours. ABG No results for input(s): PHART, HCO3 in the last 72 hours.  Invalid input(s): PCO2, PO2  Studies/Results: Dg Chest 2 View  Result Date: 07/19/2017 CLINICAL DATA:  Cough and sore throat with loss of voice yesterday. EXAM: CHEST - 2 VIEW COMPARISON:  None. FINDINGS: Heart is top-normal in size with moderate aortic atherosclerosis. No aneurysm. Platelike atelectasis and/or scarring noted the left upper lobe with left basilar subsegmental atelectasis also noted. No confluent airspace disease suspicious for pneumonia. No dominant mass. Mild interstitial edema is seen. No alveolar consolidation, effusion or pneumothorax. Remote healed fracture deformity of the right humeral head and neck. No acute osseous abnormality. IMPRESSION: 1. Platelike atelectasis and/or  scarring in the left upper lobe. 2. Aortic atherosclerosis without aneurysm. 3. Mild interstitial edema. 4. Healed fracture deformity of the right proximal humerus. Electronically Signed   By: Ashley Royalty M.D.   On: 07/19/2017 18:55   US Abdomen Limited Ruq  Result Date: 07/19/2017 CLINICAL DATA:  Elevated liver enzymes EXAM: ULTRASOUND ABDOMEN LIMITED RIGHT UPPER QUADRANT COMPARISON:  None. FINDINGS: Gallbladder: Gallbladder nondistended. Several echogenic gallstones present measuring up to 5 mm. Small amount of sludge. No pericholecystic fluid. No gallbladder wall thickening. Negative sonographic Murphy's sign. Tenderness over gallbladder. Common bile duct: Diameter: Mildly dilated from 7-8 mm Liver: No focal lesion identified. Within normal limits in parenchymal echogenicity. Portal vein is patent on color Doppler imaging with normal direction of blood flow towards the liver. IMPRESSION: 1. Cholelithiasis and dilated common bile duct. 2. Tenderness over the gallbladder without frank Murphy sign. 3. Recommend correlation for cholecystitis or biliary obstruction. Electronically Signed   By: Suzy Bouchard M.D.   On: 07/19/2017 23:51    Anti-infectives: Anti-infectives (From admission, onward)   Start     Dose/Rate Route Frequency Ordered Stop   07/21/17 0000  cefTRIAXone (ROCEPHIN) 2 g in sodium chloride 0.9 % 100 mL IVPB     2 g 200 mL/hr over 30 Minutes Intravenous Every 24 hours 07/20/17 0034     07/20/17 0045  cefTRIAXone (ROCEPHIN) 2 g in sodium chloride 0.9 % 100 mL IVPB     2 g 200 mL/hr over 30 Minutes Intravenous  Once 07/20/17 0042 07/20/17 0306      Assessment/Plan: Patient Active Problem List  Diagnosis Date Noted  . Cholecystitis, acute with cholelithiasis 07/20/2017  . Pressure injury of skin 07/20/2017  . History of total right hip arthroplasty 01/05/2017  . Acute blood loss as cause of postoperative anemia 01/05/2017  . Escherichia coli urinary tract infection 01/05/2017   . Acute encephalopathy 01/05/2017  . Depression, recurrent ([place]) 01/05/2017  . Polyneuropathy 01/05/2017  . Closed right hip fracture ([place]) 12/25/2016  . Anxiety 11/03/2016  . Dysphagia 07/13/2016  . Anasarca 05/21/2016  . Obesity (BMI 30.0-34.9) 05/21/2016  . Closed TBI (traumatic brain injury) ([place]) 04/21/2016  . Status post insertion of percutaneous endoscopic gastrostomy (PEG) tube ([place]) 04/21/2016  . Facial laceration 04/21/2016  . Cheek wound, right, subsequent encounter 04/21/2016  . Urinary tract infection due to extended-spectrum beta lactamase (ESBL) producing Escherichia coli 04/21/2016  . Hypotension 04/21/2016  . Gout   . Hypertension      For lap chole later today Son signed consent  The procedure has been discussed with the patient. Operative and non operative treatments have been discussed. Risks of surgery include bleeding, infection,  Common bile duct injury,  Injury to the stomach,liver, colon,small intestine, abdominal wall,  Diaphragm,  Major blood vessels,  And the need for an open procedure.  Other risks include worsening of medical problems, death,  DVT and pulmonary embolism, and cardiovascular events.   Medical options have also been discussed. The patient has been informed of long term expectations of surgery and non surgical options,  The patient agrees to proceed.     LOS: 0 days    Autumn Bryant 07/21/2017

## 2017-07-21 NOTE — Interval H&P Note (Signed)
History and Physical Interval Note:  07/21/2017 12:50 PM  Autumn Bryant  has presented today for surgery, with the diagnosis of CHOLECYSTITIS  The various methods of treatment have been discussed with the patient and family. After consideration of risks, benefits and other options for treatment, the patient has consented to  Procedure(s): LAPAROSCOPIC CHOLECYSTECTOMY WITH POSSIBLE INTRAOPERATIVE CHOLANGIOGRAM (N/A) as a surgical intervention .  The patient's history has been reviewed, patient examined, no change in status, stable for surgery.  I have reviewed the patient's chart and labs.  Questions were answered to the patient's satisfaction.     Autumn Bryant

## 2017-07-21 NOTE — Op Note (Signed)
Laparoscopic Cholecystectomy with IOC Procedure Note  Indications: This patient presents with symptomatic gallbladder disease and will undergo laparoscopic cholecystectomy.The procedure has been discussed with the patient AND HER SON.  Operative and non operative treatments have been discussed. Risks of surgery include bleeding, infection,  Common bile duct injury,  Injury to the stomach,liver, colon,small intestine, abdominal wall,  Diaphragm,  Major blood vessels,  And the need for an open procedure.  Other risks include worsening of medical problems, death,  DVT and pulmonary embolism, and cardiovascular events.   Medical options have also been discussed. The patient has been informed of long term expectations of surgery and non surgical options,  The patient agrees to proceed.    Pre-operative Diagnosis: Calculus of gallbladder with acute cholecystitis, without mention of obstruction  Post-operative Diagnosis: Same  Surgeon: Joyice Faster Ayde Record   Assistants: [place] PA-C  Anesthesia: General endotracheal anesthesia and Local anesthesia 0.25.% bupivacaine, with epinephrine  ASA Class: 3  Procedure Details  The patient was seen again in the Holding Room. The risks, benefits, complications, treatment options, and expected outcomes were discussed with the patient. The possibilities of reaction to medication, pulmonary aspiration, perforation of viscus, bleeding, recurrent infection, finding a normal gallbladder, the need for additional procedures, failure to diagnose a condition, the possible need to convert to an open procedure, and creating a complication requiring transfusion or operation were discussed with the patient. The patient and/or family concurred with the proposed plan, giving informed consent. The site of surgery properly noted/marked. The patient was taken to Operating Room, identified as Autumn Bryant and the procedure verified as Laparoscopic Cholecystectomy with Intraoperative  Cholangiograms. A Time Out was held and the above information confirmed.  Prior to the induction of general anesthesia, antibiotic prophylaxis was administered. General endotracheal anesthesia was then administered and tolerated well. After the induction, the abdomen was prepped in the usual sterile fashion. The patient was positioned in the supine position with the left arm comfortably tucked, along with some reverse Trendelenburg.  Local anesthetic agent was injected into the skin near the umbilicus and an incision made. The midline fascia was incised and the Hasson technique was used to introduce a 12 mm port under direct vision. It was secured with a figure of eight Vicryl suture placed in the usual fashion. Pneumoperitoneum was then created with CO2 and tolerated well without any adverse changes in the patient's vital signs. Additional trocars were introduced under direct vision with an 11 mm trocar in the epigastrium and 2 5 mm trocars in the right upper quadrant. All skin incisions were infiltrated with a local anesthetic agent before making the incision and placing the trocars.   The gallbladder was identified, the fundus grasped and retracted cephalad. Adhesions were lysed bluntly and with the electrocautery where indicated, taking care not to injure any adjacent organs or viscus. The infundibulum was grasped and retracted laterally, exposing the peritoneum overlying the triangle of Calot. This was then divided and exposed in a blunt fashion. The cystic duct was clearly identified and bluntly dissected circumferentially. The junctions of the gallbladder, cystic duct and common bile duct were clearly identified prior to the division of any linear structure.   An incision was made in the cystic duct and the cholangiogram catheter introduced. The catheter was secured using an endoclip. The study showed no stones and good visualization of the distal and proximal biliary tree. The catheter was then  removed.   The cystic duct was extremely friable and was tearing away  from the gallbladder.  Decision was made to abort cholangiogram due to poor nature of tissue and concern for CBD injury with catheter.    The gallbladder was dissected from the liver bed in retrograde fashion with the electrocautery. The gallbladder was removed. The liver bed was irrigated and inspected. Hemostasis was achieved with the electrocautery. Copious irrigation was utilized and was repeatedly aspirated until clear all particulate matter. Hemostasis was achieved with no signs of  bleeding or bile leakage.  Pneumoperitoneum was completely reduced after viewing removal of the trocars under direct vision. The wound was thoroughly irrigated and the fascia was then closed with a figure of eight suture; the skin was then closed with 4 O monocryl  and a sterile dressing was applied.  Instrument, sponge, and needle counts were correct at closure and at the conclusion of the case.   Findings: Cholecystitis with Cholelithiasis  Estimated Blood Loss: less than 50 mL                  Total IV Fluids: per anesthesia record          Specimens: Gallbladder           Complications: None; patient tolerated the procedure well.         Disposition: PACU - hemodynamically stable.         Condition: stable

## 2017-07-21 NOTE — Anesthesia Postprocedure Evaluation (Signed)
Anesthesia Post Note  Patient: Autumn Bryant  Procedure(s) Performed: LAPAROSCOPIC CHOLECYSTECTOMY (N/A Abdomen)     Patient location during evaluation: PACU Anesthesia Type: General Level of consciousness: awake Pain management: pain level controlled Vital Signs Assessment: post-procedure vital signs reviewed and stable Respiratory status: spontaneous breathing Cardiovascular status: stable Postop Assessment: no apparent nausea or vomiting Anesthetic complications: no    Last Vitals:  Vitals:   07/21/17 1606 07/21/17 1621  BP: (!) 154/76 (!) 163/78  Pulse: 76 71  Resp: 15 15  Temp:    SpO2: 95% 99%    Last Pain:  Vitals:   07/21/17 1551  TempSrc:   PainSc: Asleep   Pain Goal: Patients Stated Pain Goal: 2 (07/21/17 0806)               Keron Koffman JR,JOHN Mateo Flow

## 2017-07-21 NOTE — Care Management Note (Signed)
Case Management Note  Patient Details  Name: DIMOND CROTTY MRN: [id number] Date of Birth: 1935/08/27  Subjective/Objective:                    Action/Plan:  Will continue to follow for discharge needs. Patient from [facility]. SW following also. Expected Discharge Date:                  Expected Discharge Plan:  [place]  In-House Referral:  Clinical Social Work  Discharge planning Services  NA  Post Acute Care Choice:  NA Choice offered to:     DME Arranged:  N/A DME Agency:  NA  HH Arranged:  NA HH Agency:  NA  Status of Service:  In process, will continue to follow  If discussed at Long Length of Stay Meetings, dates discussed:    Additional Comments:  Marilu Favre, RN 07/21/2017, 10:58 AM

## 2017-07-21 NOTE — [place] FL2 (Signed)
[place] MEDICAID FL2 LEVEL OF CARE SCREENING TOOL     IDENTIFICATION  Patient Name: Autumn Bryant Birthdate: 1935/06/03 Sex: female Admission Date (Current Location): 07/19/2017  [place] and [place] Number:  Kathleen Argue [id number] [place] and Address:  [facility], [place] [address], [place], [place] [address]      Provider Number: [id number]  Attending Physician Name and Address:  Edison Pace Md, MD  Relative Name and Phone Number:  [facility], [phone]    Current Level of Care: Hospital Recommended Level of Care: [place] Prior Approval Number:    Date Approved/Denied:   PASRR Number: [id number]   Discharge Plan: SNF    Current Diagnoses: Patient Active Problem List   Diagnosis Date Noted  . Cholecystitis, acute with cholelithiasis 07/20/2017  . Pressure injury of skin 07/20/2017  . History of total right hip arthroplasty 01/05/2017  . Acute blood loss as cause of postoperative anemia 01/05/2017  . Escherichia coli urinary tract infection 01/05/2017  . Acute encephalopathy 01/05/2017  . Depression, recurrent ([place]) 01/05/2017  . Polyneuropathy 01/05/2017  . Closed right hip fracture ([place]) 12/25/2016  . Anxiety 11/03/2016  . Dysphagia 07/13/2016  . Anasarca 05/21/2016  . Obesity (BMI 30.0-34.9) 05/21/2016  . Closed TBI (traumatic brain injury) ([place]) 04/21/2016  . Status post insertion of percutaneous endoscopic gastrostomy (PEG) tube ([place]) 04/21/2016  . Facial laceration 04/21/2016  . Cheek wound, right, subsequent encounter 04/21/2016  . Urinary tract infection due to extended-spectrum beta lactamase (ESBL) producing Escherichia coli 04/21/2016  . Hypotension 04/21/2016  . Gout   . Hypertension     Orientation RESPIRATION BLADDER Height & Weight     Self  Normal Incontinent, External catheter Weight: 172 lb (78 kg) Height:  5\' 5"  (165.1 cm)  BEHAVIORAL SYMPTOMS/MOOD NEUROLOGICAL BOWEL NUTRITION STATUS   Incontinent Diet(see discharge summary)  AMBULATORY STATUS COMMUNICATION OF NEEDS Skin   Extensive Assist Verbally(minimally verbal) PU Stage and Appropriate Care, Other (Comment)(skin tear on leg with foam dressing; MASD on sacrum with foam dressing) PU Stage 1 Dressing: (on left and right heels)                     Personal Care Assistance Level of Assistance  Feeding, Bathing, Dressing Bathing Assistance: Maximum assistance Feeding assistance: Limited assistance Dressing Assistance: Maximum assistance     Functional Limitations Info  Sight, Hearing, Speech Sight Info: Adequate Hearing Info: Adequate Speech Info: Adequate(minimally verbal)    SPECIAL CARE FACTORS FREQUENCY  PT (By licensed PT), OT (By licensed OT)     PT Frequency: 5x week OT Frequency: 5x week            Contractures Contractures Info: Not present    Additional Factors Info  Code Status, Allergies Code Status Info: Full Code Allergies Info: DEMEROL MEPERIDINE            Current Medications (07/21/2017):  This is the current hospital active medication list Current Facility-Administered Medications  Medication Dose Route Frequency Provider Last Rate Last Dose  . acetaminophen (TYLENOL) tablet 650 mg  650 mg Oral Q6H PRN Greer Pickerel, MD   650 mg at 07/21/17 0800   Or  . acetaminophen (TYLENOL) suppository 650 mg  650 mg Rectal Q6H PRN Greer Pickerel, MD      . ALPRAZolam Duanne Moron) tablet 0.5 mg  0.5 mg Oral BID PRN Greer Pickerel, MD      . cefTRIAXone (ROCEPHIN) 2 g in sodium chloride 0.9 % 100 mL IVPB  2 g Intravenous Q24H Greer Pickerel, MD   Stopped at 07/21/17 0221  . dextrose 5 % and 0.45 % NaCl with KCl 20 mEq/L infusion   Intravenous Continuous Greer Pickerel, MD 100 mL/hr at 07/21/17 0932    . enoxaparin (LOVENOX) injection 40 mg  40 mg Subcutaneous Q24H Greer Pickerel, MD      . escitalopram Loma Sousa) tablet 5 mg  5 mg Oral Daily Greer Pickerel, MD   5 mg at 07/21/17 0804  . gabapentin (NEURONTIN)  tablet 600 mg  600 mg Oral TID Greer Pickerel, MD   600 mg at 07/21/17 0805  . latanoprost (XALATAN) 0.005 % ophthalmic solution 1 drop  1 drop Both Eyes QHS Greer Pickerel, MD   1 drop at 07/20/17 2130  . methocarbamol (ROBAXIN) tablet 1,000 mg  1,000 mg Oral Q8H PRN Greer Pickerel, MD   1,000 mg at 07/21/17 0800  . ondansetron (ZOFRAN-ODT) disintegrating tablet 4 mg  4 mg Oral Q6H PRN Greer Pickerel, MD       Or  . ondansetron [facility]) injection 4 mg  4 mg Intravenous Q6H PRN Greer Pickerel, MD      . oxyCODONE (Oxy IR/ROXICODONE) immediate release tablet 5-10 mg  5-10 mg Oral Q4H PRN Greer Pickerel, MD      . [facility]) tablet 8.6 mg  1 tablet Oral BID Greer Pickerel, MD   8.6 mg at 07/20/17 2130     Discharge Medications: Please see discharge summary for a list of discharge medications.  Relevant Imaging Results:  Relevant Lab Results:   Additional Information SS# [id number]; ESBL infection  Alexander Mt, LCSWA

## 2017-07-21 NOTE — Anesthesia Procedure Notes (Signed)
Procedure Name: Intubation Date/Time: 07/21/2017 1:51 PM Performed by: Shirlyn Goltz, CRNA Pre-anesthesia Checklist: Patient identified, Emergency Drugs available, Suction available and Patient being monitored Patient Re-evaluated:Patient Re-evaluated prior to induction Oxygen Delivery Method: Circle system utilized Preoxygenation: Pre-oxygenation with 100% oxygen Induction Type: IV induction and Rapid sequence Laryngoscope Size: Mac and 4 Grade View: Grade I Tube type: Oral Tube size: 7.0 mm Number of attempts: 1 Airway Equipment and Method: Stylet Placement Confirmation: ETT inserted through vocal cords under direct vision,  positive ETCO2 and breath sounds checked- equal and bilateral Secured at: 20 cm Tube secured with: Tape Dental Injury: Teeth and Oropharynx as per pre-operative assessment

## 2017-07-21 NOTE — Discharge Instructions (Signed)

## 2017-07-21 NOTE — H&P (View-Only) (Signed)
Day of Surgery   Subjective/Chief Complaint: abdominal pain Pt without complaints  Minimally verbal    Objective: Vital signs in last 24 hours: Temp:  [97.5 F (36.4 C)-98.6 F (37 C)] 97.9 F (36.6 C) (06/17 0453) Pulse Rate:  [60-77] 60 (06/17 0453) Resp:  [16-18] 16 (06/17 0453) BP: (128-137)/(67-73) 137/70 (06/17 0453) SpO2:  [95 %-100 %] 98 % (06/17 0453) Last BM Date: 07/20/17  Intake/Output from previous day: 06/16 0701 - 06/17 0700 In: 1248.3 [P.O.:320; I.V.:685; IV Piggyback:243.3] Out: 900 [Urine:900] Intake/Output this shift: No intake/output data recorded.  General appearance: appears older than stated age GI: soft, non-tender; bowel sounds normal; no masses,  no organomegaly Extremities: extremities normal, atraumatic, no cyanosis or edema  Lab Results:  Recent Labs    07/19/17 2107 07/20/17 0537  WBC 10.7* 7.8  HGB 12.9 12.0  HCT 41.9 38.4  PLT 181 192   BMET Recent Labs    07/19/17 2107 07/20/17 0537  NA 143 141  K 3.1* 2.9*  CL 101 104  CO2 29 28  GLUCOSE 92 93  BUN 10 7  CREATININE 0.58 0.45  CALCIUM 8.6* 8.4*   PT/INR No results for input(s): LABPROT, INR in the last 72 hours. ABG No results for input(s): PHART, HCO3 in the last 72 hours.  Invalid input(s): PCO2, PO2  Studies/Results: Dg Chest 2 View  Result Date: 07/19/2017 CLINICAL DATA:  Cough and sore throat with loss of voice yesterday. EXAM: CHEST - 2 VIEW COMPARISON:  None. FINDINGS: Heart is top-normal in size with moderate aortic atherosclerosis. No aneurysm. Platelike atelectasis and/or scarring noted the left upper lobe with left basilar subsegmental atelectasis also noted. No confluent airspace disease suspicious for pneumonia. No dominant mass. Mild interstitial edema is seen. No alveolar consolidation, effusion or pneumothorax. Remote healed fracture deformity of the right humeral head and neck. No acute osseous abnormality. IMPRESSION: 1. Platelike atelectasis and/or  scarring in the left upper lobe. 2. Aortic atherosclerosis without aneurysm. 3. Mild interstitial edema. 4. Healed fracture deformity of the right proximal humerus. Electronically Signed   By: Ashley Royalty M.D.   On: 07/19/2017 18:55   US Abdomen Limited Ruq  Result Date: 07/19/2017 CLINICAL DATA:  Elevated liver enzymes EXAM: ULTRASOUND ABDOMEN LIMITED RIGHT UPPER QUADRANT COMPARISON:  None. FINDINGS: Gallbladder: Gallbladder nondistended. Several echogenic gallstones present measuring up to 5 mm. Small amount of sludge. No pericholecystic fluid. No gallbladder wall thickening. Negative sonographic Murphy's sign. Tenderness over gallbladder. Common bile duct: Diameter: Mildly dilated from 7-8 mm Liver: No focal lesion identified. Within normal limits in parenchymal echogenicity. Portal vein is patent on color Doppler imaging with normal direction of blood flow towards the liver. IMPRESSION: 1. Cholelithiasis and dilated common bile duct. 2. Tenderness over the gallbladder without frank Murphy sign. 3. Recommend correlation for cholecystitis or biliary obstruction. Electronically Signed   By: Suzy Bouchard M.D.   On: 07/19/2017 23:51    Anti-infectives: Anti-infectives (From admission, onward)   Start     Dose/Rate Route Frequency Ordered Stop   07/21/17 0000  cefTRIAXone (ROCEPHIN) 2 g in sodium chloride 0.9 % 100 mL IVPB     2 g 200 mL/hr over 30 Minutes Intravenous Every 24 hours 07/20/17 0034     07/20/17 0045  cefTRIAXone (ROCEPHIN) 2 g in sodium chloride 0.9 % 100 mL IVPB     2 g 200 mL/hr over 30 Minutes Intravenous  Once 07/20/17 0042 07/20/17 0306      Assessment/Plan: Patient Active Problem List  Diagnosis Date Noted  . Cholecystitis, acute with cholelithiasis 07/20/2017  . Pressure injury of skin 07/20/2017  . History of total right hip arthroplasty 01/05/2017  . Acute blood loss as cause of postoperative anemia 01/05/2017  . Escherichia coli urinary tract infection 01/05/2017   . Acute encephalopathy 01/05/2017  . Depression, recurrent ([place]) 01/05/2017  . Polyneuropathy 01/05/2017  . Closed right hip fracture ([place]) 12/25/2016  . Anxiety 11/03/2016  . Dysphagia 07/13/2016  . Anasarca 05/21/2016  . Obesity (BMI 30.0-34.9) 05/21/2016  . Closed TBI (traumatic brain injury) ([place]) 04/21/2016  . Status post insertion of percutaneous endoscopic gastrostomy (PEG) tube ([place]) 04/21/2016  . Facial laceration 04/21/2016  . Cheek wound, right, subsequent encounter 04/21/2016  . Urinary tract infection due to extended-spectrum beta lactamase (ESBL) producing Escherichia coli 04/21/2016  . Hypotension 04/21/2016  . Gout   . Hypertension      For lap chole later today Son signed consent  The procedure has been discussed with the patient. Operative and non operative treatments have been discussed. Risks of surgery include bleeding, infection,  Common bile duct injury,  Injury to the stomach,liver, colon,small intestine, abdominal wall,  Diaphragm,  Major blood vessels,  And the need for an open procedure.  Other risks include worsening of medical problems, death,  DVT and pulmonary embolism, and cardiovascular events.   Medical options have also been discussed. The patient has been informed of long term expectations of surgery and non surgical options,  The patient agrees to proceed.     LOS: 0 days    Autumn Bryant 07/21/2017

## 2017-07-21 NOTE — Transfer of Care (Signed)
Immediate Anesthesia Transfer of Care Note  Patient: Autumn Bryant  Procedure(s) Performed: LAPAROSCOPIC CHOLECYSTECTOMY (N/A Abdomen)  Patient Location: PACU  Anesthesia Type:General  Level of Consciousness: awake and alert   Airway & Oxygen Therapy: Patient Spontanous Breathing and Patient connected to nasal cannula oxygen  Post-op Assessment: Report given to RN and Post -op Vital signs reviewed and stable  Post vital signs: Reviewed and stable  Last Vitals:  Vitals Value Taken Time  BP 145/70 07/21/2017  3:21 PM  Temp    Pulse 86 07/21/2017  3:24 PM  Resp 14 07/21/2017  3:24 PM  SpO2 96 % 07/21/2017  3:24 PM  Vitals shown include unvalidated device data.  Last Pain:  Vitals:   07/21/17 0900  TempSrc:   PainSc: Asleep      Patients Stated Pain Goal: 2 (24/09/73 5329)  Complications: No apparent anesthesia complications

## 2017-07-21 NOTE — Anesthesia Preprocedure Evaluation (Signed)
Anesthesia Evaluation  Patient identified by MRN, date of birth, ID band Patient confused    Reviewed: Allergy & Precautions, NPO status , Patient's Chart, lab work & pertinent test results  Airway Mallampati: II  TM Distance: >3 FB Neck ROM: Full    Dental  (+) Dental Advisory Given, Poor Dentition   Pulmonary neg pulmonary ROS,    Pulmonary exam normal        Cardiovascular hypertension, Normal cardiovascular exam  Impressions:  - Vigorous LV systolic function; mild diastolic dysfunction; mildly   elevated LVOT velocity likely related to vigorous LV function;   mild LAE; mild TR; possible catheter vs prominent eustachian   valve noted in RA.    Neuro/Psych Anxiety TBI 04/21/2016    GI/Hepatic negative GI ROS, Neg liver ROS,   Endo/Other  negative endocrine ROS  Renal/GU negative Renal ROS     Musculoskeletal negative musculoskeletal ROS (+)   Abdominal   Peds  Hematology negative hematology ROS (+)   Anesthesia Other Findings Day of surgery medications reviewed with the patient.  Reproductive/Obstetrics                             Anesthesia Physical  Anesthesia Plan  ASA: III  Anesthesia Plan: General   Post-op Pain Management:    Induction: Intravenous  PONV Risk Score and Plan: 3 and Ondansetron, Dexamethasone and Treatment may vary due to age or medical condition  Airway Management Planned: Oral ETT  Additional Equipment:   Intra-op Plan:   Post-operative Plan: Extubation in OR  Informed Consent: I have reviewed the patients History and Physical, chart, labs and discussed the procedure including the risks, benefits and alternatives for the proposed anesthesia with the patient or authorized representative who has indicated his/her understanding and acceptance.   Dental advisory given  Plan Discussed with: CRNA and Surgeon  Anesthesia Plan Comments:          Anesthesia Quick Evaluation

## 2017-07-22 ENCOUNTER — Encounter (HOSPITAL_COMMUNITY): Payer: Self-pay | Admitting: Surgery

## 2017-07-22 DIAGNOSIS — Z9049 Acquired absence of other specified parts of digestive tract: Secondary | ICD-10-CM | POA: Diagnosis not present

## 2017-07-22 DIAGNOSIS — R488 Other symbolic dysfunctions: Secondary | ICD-10-CM | POA: Diagnosis not present

## 2017-07-22 DIAGNOSIS — G629 Polyneuropathy, unspecified: Secondary | ICD-10-CM | POA: Diagnosis not present

## 2017-07-22 DIAGNOSIS — K8010 Calculus of gallbladder with chronic cholecystitis without obstruction: Secondary | ICD-10-CM | POA: Diagnosis not present

## 2017-07-22 DIAGNOSIS — K819 Cholecystitis, unspecified: Secondary | ICD-10-CM | POA: Diagnosis not present

## 2017-07-22 DIAGNOSIS — Z48815 Encounter for surgical aftercare following surgery on the digestive system: Secondary | ICD-10-CM | POA: Diagnosis not present

## 2017-07-22 DIAGNOSIS — I10 Essential (primary) hypertension: Secondary | ICD-10-CM | POA: Diagnosis not present

## 2017-07-22 DIAGNOSIS — F339 Major depressive disorder, recurrent, unspecified: Secondary | ICD-10-CM | POA: Diagnosis not present

## 2017-07-22 DIAGNOSIS — R45851 Suicidal ideations: Secondary | ICD-10-CM | POA: Diagnosis not present

## 2017-07-22 DIAGNOSIS — M2550 Pain in unspecified joint: Secondary | ICD-10-CM | POA: Diagnosis not present

## 2017-07-22 DIAGNOSIS — R1312 Dysphagia, oropharyngeal phase: Secondary | ICD-10-CM | POA: Diagnosis not present

## 2017-07-22 DIAGNOSIS — K8000 Calculus of gallbladder with acute cholecystitis without obstruction: Secondary | ICD-10-CM | POA: Diagnosis not present

## 2017-07-22 DIAGNOSIS — M6281 Muscle weakness (generalized): Secondary | ICD-10-CM | POA: Diagnosis not present

## 2017-07-22 DIAGNOSIS — Z7401 Bed confinement status: Secondary | ICD-10-CM | POA: Diagnosis not present

## 2017-07-22 DIAGNOSIS — F419 Anxiety disorder, unspecified: Secondary | ICD-10-CM | POA: Diagnosis not present

## 2017-07-22 DIAGNOSIS — F329 Major depressive disorder, single episode, unspecified: Secondary | ICD-10-CM | POA: Diagnosis not present

## 2017-07-22 DIAGNOSIS — R490 Dysphonia: Secondary | ICD-10-CM | POA: Diagnosis not present

## 2017-07-22 LAB — COMPREHENSIVE METABOLIC PANEL
ALBUMIN: 2.6 g/dL — AB (ref 3.5–5.0)
ALT: 36 U/L (ref 14–54)
ANION GAP: 8 (ref 5–15)
AST: 28 U/L (ref 15–41)
Alkaline Phosphatase: 97 U/L (ref 38–126)
BUN: 5 mg/dL — AB (ref 6–20)
CHLORIDE: 107 mmol/L (ref 101–111)
CO2: 26 mmol/L (ref 22–32)
Calcium: 8.5 mg/dL — ABNORMAL LOW (ref 8.9–10.3)
Creatinine, Ser: 0.68 mg/dL (ref 0.44–1.00)
GFR calc Af Amer: >60 mL/min (ref >60)
GFR calc non Af Amer: >60 mL/min (ref >60)
GLUCOSE: 156 mg/dL — AB (ref 65–99)
POTASSIUM: 4.3 mmol/L (ref 3.5–5.1)
SODIUM: 141 mmol/L (ref 135–145)
Total Bilirubin: 0.7 mg/dL (ref 0.3–1.2)
Total Protein: 5.7 g/dL — ABNORMAL LOW (ref 6.5–8.1)

## 2017-07-22 LAB — CBC
HEMATOCRIT: 39.7 % (ref 36.0–46.0)
HEMOGLOBIN: 12.3 g/dL (ref 12.0–15.0)
MCH: 28.1 pg (ref 26.0–34.0)
MCHC: 31 g/dL (ref 30.0–36.0)
MCV: 90.6 fL (ref 78.0–100.0)
Platelets: 220 10*3/uL (ref 150–400)
RBC: 4.38 MIL/uL (ref 3.87–5.11)
RDW: 15.4 % (ref 11.5–15.5)
WBC: 20 10*3/uL — ABNORMAL HIGH (ref 4.0–10.5)

## 2017-07-22 MED ORDER — OXYCODONE HCL 5 MG PO TABS: 5.0000 mg | ORAL_TABLET | ORAL | 0 refills | Status: DC | PRN

## 2017-07-22 MED ORDER — POTASSIUM CHLORIDE 10 MEQ/100ML IV SOLN: 10.0000 meq | INTRAVENOUS | Status: DC

## 2017-07-22 NOTE — Progress Notes (Signed)
1 Day Post-Op   Subjective/Chief Complaint:  Pt denies pain responds to questions   Objective: Vital signs in last 24 hours: Temp:  [97.2 F (36.2 C)-98.6 F (37 C)] 98.6 F (37 C) (06/18 5797) Pulse Rate:  [64-100] 100 (06/18 0613) Resp:  [13-20] 16 (06/18 0613) BP: (110-163)/(58-95) 110/58 (06/18 0613) SpO2:  [89 %-100 %] 89 % (06/18 2820) Weight:  [78 kg (172 lb)] 78 kg (172 lb) (06/17 1227) Last BM Date: 07/22/17  Intake/Output from previous day: 06/17 0701 - 06/18 0700 In: 3286 [P.O.:240; I.V.:2946; IV Piggyback:100] Out: 665 [Urine:650; Blood:15] Intake/Output this shift: No intake/output data recorded.  GI: incisions  CDI soft NT   Lab Results:  Recent Labs    07/19/17 2107 07/20/17 0537  WBC 10.7* 7.8  HGB 12.9 12.0  HCT 41.9 38.4  PLT 181 192   BMET Recent Labs    07/19/17 2107 07/20/17 0537  NA 143 141  K 3.1* 2.9*  CL 101 104  CO2 29 28  GLUCOSE 92 93  BUN 10 7  CREATININE 0.58 0.45  CALCIUM 8.6* 8.4*   PT/INR No results for input(s): LABPROT, INR in the last 72 hours. ABG No results for input(s): PHART, HCO3 in the last 72 hours.  Invalid input(s): PCO2, PO2  Studies/Results: No results found.  Anti-infectives: Anti-infectives (From admission, onward)   Start     Dose/Rate Route Frequency Ordered Stop   07/21/17 0000  cefTRIAXone (ROCEPHIN) 2 g in sodium chloride 0.9 % 100 mL IVPB     2 g 200 mL/hr over 30 Minutes Intravenous Every 24 hours 07/20/17 0034     07/20/17 0045  cefTRIAXone (ROCEPHIN) 2 g in sodium chloride 0.9 % 100 mL IVPB     2 g 200 mL/hr over 30 Minutes Intravenous  Once 07/20/17 0042 07/20/17 0306      Assessment/Plan: s/p Procedure(s): LAPAROSCOPIC CHOLECYSTECTOMY (N/A) [place] to return to [facility] when BED AVAILABLE D/W SON ON PHONE HE WILL LOOK TO MOVE HER ON HIS OWN   LOS: 0 days    Marcello Moores A Delsie Amador 07/22/2017
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
## 2017-07-22 NOTE — Clinical Social Work Placement (Signed)
   CLINICAL SOCIAL WORK PLACEMENT  NOTE [name] RN to call report to [phone]  Date:  07/22/2017  Patient Details  Name: Autumn Bryant MRN: [id number] Date of Birth: March 21, 1935  Clinical Social Work is seeking post-discharge placement for this patient at the [place] level of care (*CSW will initial, date and re-position this form in  chart as items are completed):  Yes   Patient/family provided with [place] Work Department's list of facilities offering this level of care within the geographic area requested by the patient (or if unable, by the patient's family).  Yes   Patient/family informed of their freedom to choose among providers that offer the needed level of care, that participate in Medicare, Medicaid or managed care program needed by the patient, have an available bed and are willing to accept the patient.  Yes   Patient/family informed of [place]'s ownership interest in [facility] and [facility], as well as of the fact that they are under no obligation to receive care at these facilities.  PASRR submitted to EDS on       PASRR number received on       Existing PASRR number confirmed on 07/21/17     FL2 transmitted to all facilities in geographic area requested by pt/family on 07/21/17     FL2 transmitted to all facilities within larger geographic area on       Patient informed that his/her managed care company has contracts with or will negotiate with certain facilities, including the following:        Yes   Patient/family informed of bed offers received.  Patient chooses bed at [facility] and [place] recommends and patient chooses bed at [facility]    Patient to be transferred to [facility] on 07/22/17.  Patient to be transferred to facility by [name]     Patient family notified on 07/22/17 of transfer.  Name of family member notified:  son, Aaron Edelman      PHYSICIAN Please prepare prescriptions     Additional Comment:    _______________________________________________ Alexander Mt, [place] 07/22/2017, 12:38 PM

## 2017-07-22 NOTE — Social Work (Signed)
Clinical Social Worker facilitated patient discharge including contacting patient family and facility to confirm patient discharge plans.  Clinical information faxed to facility and family agreeable with plan.  CSW arranged ambulance transport via [name] to [facility].  RN to call [phone] with report  prior to discharge.  Clinical Social Worker will sign off for now as social work intervention is no longer needed. Please consult [place] again if new need arises.  Alexander Mt, [place] Social Worker [phone]

## 2017-07-22 NOTE — Discharge Summary (Signed)
Patient ID: Autumn Bryant [id number] Sep 23, 1935 82 y.o.  Admit date: 07/19/2017 Discharge date: 07/22/2017  Admitting Diagnosis: Cholecystitis TBI  Discharge Diagnosis Patient Active Problem List   Diagnosis Date Noted  . Cholecystitis, acute with cholelithiasis 07/20/2017  . Pressure injury of skin 07/20/2017  . History of total right hip arthroplasty 01/05/2017  . Acute blood loss as cause of postoperative anemia 01/05/2017  . Escherichia coli urinary tract infection 01/05/2017  . Acute encephalopathy 01/05/2017  . Depression, recurrent ([facility]) 01/05/2017  . Polyneuropathy 01/05/2017  . Closed right hip fracture ([place]) 12/25/2016  . Anxiety 11/03/2016  . Dysphagia 07/13/2016  . Anasarca 05/21/2016  . Obesity (BMI 30.0-34.9) 05/21/2016  . Closed TBI (traumatic brain injury) ([place]) 04/21/2016  . Status post insertion of percutaneous endoscopic gastrostomy (PEG) tube ([place]) 04/21/2016  . Facial laceration 04/21/2016  . Cheek wound, right, subsequent encounter 04/21/2016  . Urinary tract infection due to extended-spectrum beta lactamase (ESBL) producing Escherichia coli 04/21/2016  . Hypotension 04/21/2016  . Gout   . Hypertension     Consultants none  Reason for Admission: 82 year old female who resides in a nursing home with a past medical history of traumatic brain injury was brought to the emergency room for evaluation at the request of her son because she did not reportedly sound right on the phone.  He was concerned that her voice was hoarse.  In the emergency room she was found to have upper abdominal pain, mildly elevated transaminases . She states she had stomach pain but no n/v/f/c. She doesn't know year, month, city or location. No other history is obtainable.  Procedures Laparoscopic cholecystectomy with IOC, Dr. Brantley Stage on 07-21-17  Hospital Course:  The patient was admitted and underwent a laparoscopic cholecystectomy with IOC.  The patient tolerated the  procedure well.  On POD 1, the patient was tolerating a regular diet, voiding well, and pain was controlled with oral pain medications.  The patient was stable for DC back to [facility] at this time with appropriate follow up made.  She is quite shaky at times and will need the facility to assist her with feeding her at meal times.  Her stay was discussed multiple times with her son.   Physical Exam: See exam from progress note earlier today  Allergies as of 07/22/2017      Reactions   Demerol [meperidine] Swelling      Medication List    STOP taking these medications   cefTRIAXone 1 g injection Commonly known as:  ROCEPHIN     TAKE these medications   ALPRAZolam 0.5 MG tablet Commonly known as:  XANAX Take 0.5 mg by mouth 2 (two) times daily.   bimatoprost 0.01 % Soln Commonly known as:  LUMIGAN Place 1 drop into both eyes at bedtime. For glaucoma   bisacodyl 10 MG suppository Commonly known as:  DULCOLAX Place 10 mg rectally daily as needed (constipation not relieved by MOM).   ENSURE Take 237 mLs by mouth 3 (three) times daily between meals.   escitalopram 5 MG tablet Commonly known as:  LEXAPRO Take 5 mg by mouth daily.   FLEET ENEMA RE Place 1 each rectally daily as needed (constipation not relieved by bisacodyl suppository).   furosemide 20 MG tablet Commonly known as:  LASIX Take 20 mg by mouth daily.   gabapentin 600 MG tablet Commonly known as:  NEURONTIN Take 600 mg by mouth 3 (three) times daily.   methocarbamol 500 MG tablet  Commonly known as:  ROBAXIN Take 1,000 mg by mouth 3 (three) times daily. For  muscle spasms   MILK OF MAGNESIA PO Take 30 mLs by mouth daily as needed (if no BM in 3 days).   oxyCODONE 5 MG immediate release tablet Commonly known as:  Oxy IR/ROXICODONE Take 1 tablet (5 mg total) by mouth every 4 (four) hours as needed for moderate pain.   oxyCODONE-acetaminophen 10-325 MG tablet Commonly known as:   PERCOCET Take 1 tablet by mouth every 8 (eight) hours.   sennosides-docusate sodium 8.6-50 MG tablet Commonly known as:  SENOKOT-S Take 2 tablets by mouth 2 (two) times daily as needed for constipation.        Follow-up Information    [facility] [place]. Go on 08/05/2017.   Specialty:  General Surgery Why:  Your post-op follow up appointment is scheduled for 10:00 AM. Bring photo ID and insurance information. Please arrive 30 min prior to appointment time for check in.  Contact information: [address] [place] [id number] [phone]           Signed: Saverio Danker, [facility] 07/22/2017, 12:22 PM Pager: [phone]

## 2017-07-23 ENCOUNTER — Telehealth: Payer: Self-pay

## 2017-07-23 ENCOUNTER — Encounter: Payer: Self-pay | Admitting: Internal Medicine

## 2017-07-23 ENCOUNTER — Non-Acute Institutional Stay (SKILLED_NURSING_FACILITY): Payer: Medicare Other | Admitting: Internal Medicine

## 2017-07-23 DIAGNOSIS — K819 Cholecystitis, unspecified: Secondary | ICD-10-CM | POA: Diagnosis not present

## 2017-07-23 DIAGNOSIS — K8000 Calculus of gallbladder with acute cholecystitis without obstruction: Secondary | ICD-10-CM | POA: Diagnosis not present

## 2017-07-23 DIAGNOSIS — G629 Polyneuropathy, unspecified: Secondary | ICD-10-CM | POA: Diagnosis not present

## 2017-07-23 DIAGNOSIS — F339 Major depressive disorder, recurrent, unspecified: Secondary | ICD-10-CM

## 2017-07-23 DIAGNOSIS — F419 Anxiety disorder, unspecified: Secondary | ICD-10-CM | POA: Diagnosis not present

## 2017-07-23 NOTE — Telephone Encounter (Signed)
Possible re-admission to facility. This is a patient you were seeing at [facility] F/U is needed if patient was re-admitted to facility upon discharge. Hospital discharge from [facility] on 07/22/2017

## 2017-07-23 NOTE — Progress Notes (Signed)
: Provider:   Location:  [facility] Room Number: 211 Place of Service:  SNF [phone]) Provider: [name], MD  PCP: [name], MD [name], [name]  Extended Emergency Contact Information Primary Emergency Contact: [name] Address: [address]          [place], [place] [address] [name] of [place] Phone: [phone] Relation: Son Secondary Emergency Contact: [name],[name]          [place], [place] Relation: Sister     Allergies: Demerol [meperidine]  Chief Complaint  Patient presents with  . Readmit To SNF    HPI: Patient is 82 y.o. female with traumatic brain injury who presented to [facility] emergency department for evaluation at the request of her son because she sounded hoarse over the phone.  In the emergency department she was found to have upper abdominal pain and mildly elevated transaminases.  She admits she has stomach pain pain but no nausea vomiting diarrhea.  No other history was obtainable.  Patient was admitted to [facility] from 6/15-18 where she was found to have cholecystitis and acute cholelithiasis.  She underwent a laparoscopic cholecystectomy and tolerated procedure well.  Patient is admitted back to skilled nursing facility for residential care.  While at skilled nursing facility patient will be followed for anxiety treated with Xanax, depression treated with Lexapro, and polyneuropathy treated with Neurontin.  Past Medical History:  Diagnosis Date  . Anasarca 05/21/2016  . Anxiety 11/03/2016  . Closed TBI (traumatic brain injury) ([place]) 04/21/2016  . Dysphagia 07/13/2016  . Gout   . Hypertension   . Obesity (BMI 30.0-34.9) 05/21/2016  . Status post insertion of percutaneous endoscopic gastrostomy (PEG) tube ([place]) 04/21/2016    Past Surgical History:  Procedure Laterality Date  . CHOLECYSTECTOMY  N/A 07/21/2017   Procedure: LAPAROSCOPIC CHOLECYSTECTOMY;  Surgeon: Erroll Luna, MD;  Location: [facility];  Service: General;  Laterality: N/A;  . INTRAMEDULLARY (IM) NAIL INTERTROCHANTERIC Right 12/27/2016   Procedure: INTRAMEDULLARY (IM) NAIL INTERTROCHANTRIC RIGHT HIP;  Surgeon: Paralee Cancel, MD;  Location: [facility];  Service: Orthopedics;  Laterality: Right;  . MASTECTOMY      Allergies as of 07/23/2017      Reactions   Demerol [meperidine] Swelling      Medication List        Accurate as of 07/23/17 10:09 AM. Always use your most recent med list.          ALPRAZolam 0.5 MG tablet Commonly known as:  XANAX Take 0.5 mg by mouth 2 (two) times daily.   bimatoprost 0.01 % Soln Commonly known as:  LUMIGAN Place 1 drop into both eyes at bedtime. For glaucoma   bisacodyl 10 MG suppository Commonly known as:  DULCOLAX Place 10 mg rectally daily as needed (constipation not relieved by MOM).   ENSURE Take 237 mLs by mouth 3 (three) times daily between meals.   escitalopram 5 MG tablet Commonly known as:  LEXAPRO Take 5 mg by mouth daily.   FLEET ENEMA RE Place 1 each rectally daily as needed (constipation not relieved by bisacodyl suppository).   furosemide 20 MG tablet Commonly known as:  LASIX Take 20 mg by mouth daily.   gabapentin 600 MG tablet Commonly known as:  NEURONTIN Take 600 mg by mouth 3 (three) times daily.   methocarbamol 500 MG tablet Commonly known as:  ROBAXIN  Take 1,000 mg by mouth 3 (three) times daily. For  muscle spasms   MILK OF MAGNESIA PO Take 30 mLs by mouth daily as needed (if no BM in 3 days).   oxyCODONE 5 MG immediate release tablet Commonly known as:  Oxy IR/ROXICODONE Take 1 tablet (5 mg total) by mouth every 4 (four) hours as needed for moderate pain.   oxyCODONE-acetaminophen 10-325 MG tablet Commonly known as:  PERCOCET Take 1 tablet by mouth every 8 (eight) hours.   sennosides-docusate sodium 8.6-50 MG tablet Commonly known  as:  SENOKOT-S Take 2 tablets by mouth 2 (two) times daily as needed for constipation.       No orders of the defined types were placed in this encounter.   Immunization History  Administered Date(s) Administered  . Influenza-Unspecified 12/19/2016  . PPD Test 04/29/2016  . Pneumococcal Polysaccharide-23 04/15/2016  . Tdap 03/31/2016    Social History   Tobacco Use  . Smoking status: Never Smoker  . Smokeless tobacco: Never Used  Substance Use Topics  . Alcohol use: No    Family history is   Family History  Problem Relation Age of Onset  . Asthma Mother   . Anesthesia problems Neg Hx   . Arthritis Neg Hx   . Cancer Neg Hx   . Cerebral palsy Neg Hx   . Clotting disorder Neg Hx   . Club foot Neg Hx   . Collagen disease Neg Hx   . Deep vein thrombosis Neg Hx   . Gait disorder Neg Hx   . Heart disease Neg Hx   . Gout Neg Hx   . Diabetes Neg Hx   . Hip dysplasia Neg Hx   . Hip fracture Neg Hx   . Hypermobility Neg Hx   . Hypertension Neg Hx   . Osteoporosis Neg Hx   . Other Neg Hx   . Stroke Neg Hx   . Thyroid disease Neg Hx   . Vasculitis Neg Hx   . Spina bifida Neg Hx   . Scoliosis Neg Hx   . Rheumatologic disease Neg Hx   . Pulmonary embolism Neg Hx       Review of Systems  DATA OBTAINED: from patient GENERAL:  no-very limited; nursing-no acute concerns fevers, fatigue, appetite changes SKIN: No itching, or rash EYES: No eye pain, redness, discharge EARS: No earache, tinnitus, change in hearing NOSE: No congestion, drainage or bleeding  MOUTH/THROAT: No mouth or tooth pain, No sore throat RESPIRATORY: No cough, wheezing, SOB CARDIAC: No chest pain, palpitations, lower extremity edema  GI: No abdominal pain, No N/V/D or constipation, No heartburn or reflux  GU: No dysuria, frequency or urgency, or incontinence  MUSCULOSKELETAL: No unrelieved bone/joint pain NEUROLOGIC: No headache, dizziness or focal weakness PSYCHIATRIC: No c/o anxiety or  sadness   Vitals:   07/23/17 1003  BP: 112/67  Pulse: 70  Resp: 16  Temp: 97.9 F (36.6 C)  SpO2: 98%    SpO2 Readings from Last 1 Encounters:  07/23/17 98%   Body mass index is 28.62 kg/m.     Physical Exam  GENERAL APPEARANCE: Alert, conversant,  No acute distress.  SKIN: No diaphoresis rash HEAD: Normocephalic, atraumatic  EYES: Conjunctiva/lids clear. Pupils round, reactive. EOMs intact.  EARS: External exam WNL, canals clear. Hearing grossly normal.  NOSE: No deformity or discharge.  MOUTH/THROAT: Lips w/o lesions  RESPIRATORY: Breathing is even, unlabored. Lung sounds are clear   CARDIOVASCULAR: Heart RRR no murmurs, rubs or gallops.  No peripheral edema.   GASTROINTESTINAL: Abdomen is soft, non-tender, not distended w/ normal bowel sounds; small incision sites look clean. GENITOURINARY: Bladder non tender, not distended  MUSCULOSKELETAL: No abnormal joints or musculature NEUROLOGIC:  Cranial nerves 2-12 grossly intact. Moves all extremities  PSYCHIATRIC: Mood and affect status post TBI, no behavioral issues  Patient Active Problem List   Diagnosis Date Noted  . Cholecystitis, acute with cholelithiasis 07/20/2017  . Pressure injury of skin 07/20/2017  . History of total right hip arthroplasty 01/05/2017  . Acute blood loss as cause of postoperative anemia 01/05/2017  . Escherichia coli urinary tract infection 01/05/2017  . Acute encephalopathy 01/05/2017  . Depression, recurrent ([place]) 01/05/2017  . Polyneuropathy 01/05/2017  . Closed right hip fracture ([place]) 12/25/2016  . Anxiety 11/03/2016  . Dysphagia 07/13/2016  . Anasarca 05/21/2016  . Obesity (BMI 30.0-34.9) 05/21/2016  . Closed TBI (traumatic brain injury) ([place]) 04/21/2016  . Status post insertion of percutaneous endoscopic gastrostomy (PEG) tube ([place]) 04/21/2016  . Facial laceration 04/21/2016  . Cheek wound, right, subsequent encounter 04/21/2016  . Urinary tract infection due to extended-spectrum  beta lactamase (ESBL) producing Escherichia coli 04/21/2016  . Hypotension 04/21/2016  . Gout   . Hypertension       Labs reviewed: Basic Metabolic Panel:    Component Value Date/Time   NA 141 07/22/2017 1002   NA 141 01/11/2017   K 4.3 07/22/2017 1002   CL 107 07/22/2017 1002   CO2 26 07/22/2017 1002   GLUCOSE 156 (H) 07/22/2017 1002   BUN 5 (L) 07/22/2017 1002   BUN 13 01/11/2017   CREATININE 0.68 07/22/2017 1002   CALCIUM 8.5 (L) 07/22/2017 1002   PROT 5.7 (L) 07/22/2017 1002   ALBUMIN 2.6 (L) 07/22/2017 1002   AST 28 07/22/2017 1002   ALT 36 07/22/2017 1002   ALKPHOS 97 07/22/2017 1002   BILITOT 0.7 07/22/2017 1002   GFRNONAA >60 07/22/2017 1002   GFRAA >60 07/22/2017 1002    Recent Labs    07/19/17 2107 07/20/17 0537 07/22/17 1002  NA 143 141 141  K 3.1* 2.9* 4.3  CL 101 104 107  CO2 29 28 26   GLUCOSE 92 93 156*  BUN 10 7 5*  CREATININE 0.58 0.45 0.68  CALCIUM 8.6* 8.4* 8.5*   Liver Function Tests: Recent Labs    07/19/17 2107 07/20/17 0537 07/22/17 1002  AST 57* 39 28  ALT 80* 63* 36  ALKPHOS 134* 115 97  BILITOT 1.2 1.0 0.7  PROT 6.4* 5.8* 5.7*  ALBUMIN 3.0* 2.5* 2.6*   No results for input(s): LIPASE, AMYLASE in the last 8760 hours. No results for input(s): AMMONIA in the last 8760 hours. CBC: Recent Labs    12/25/16 1800  07/19/17 2107 07/20/17 0537 07/22/17 1002  WBC 10.6*   < > 10.7* 7.8 20.0*  NEUTROABS 8.7*  --  8.2*  --   --   HGB 11.5*   < > 12.9 12.0 12.3  HCT 35.4*   < > 41.9 38.4 39.7  MCV 89.6   < > 89.7 88.9 90.6  PLT 172   < > 181 192 220   < > = values in this interval not displayed.   Lipid No results for input(s): CHOL, HDL, LDLCALC, TRIG in the last 8760 hours.  Cardiac Enzymes: No results for input(s): CKTOTAL, CKMB, CKMBINDEX, TROPONINI in the last 8760 hours. BNP: No results for input(s): BNP in the last 8760 hours. No results found for: MICROALBUR No results  found for: HGBA1C Lab Results  Component  Value Date   TSH 1.68 08/06/2013   No results found for: VITAMINB12 No results found for: FOLATE No results found for: IRON, TIBC, FERRITIN  Imaging and Procedures obtained prior to SNF admission: Dg Chest 2 View  Result Date: 07/19/2017 CLINICAL DATA:  Cough and sore throat with loss of voice yesterday. EXAM: CHEST - 2 VIEW COMPARISON:  None. FINDINGS: Heart is top-normal in size with moderate aortic atherosclerosis. No aneurysm. Platelike atelectasis and/or scarring noted the left upper lobe with left basilar subsegmental atelectasis also noted. No confluent airspace disease suspicious for pneumonia. No dominant mass. Mild interstitial edema is seen. No alveolar consolidation, effusion or pneumothorax. Remote healed fracture deformity of the right humeral head and neck. No acute osseous abnormality. IMPRESSION: 1. Platelike atelectasis and/or scarring in the left upper lobe. 2. Aortic atherosclerosis without aneurysm. 3. Mild interstitial edema. 4. Healed fracture deformity of the right proximal humerus. Electronically Signed   By: Ashley Royalty M.D.   On: 07/19/2017 18:55   US Abdomen Limited Ruq  Result Date: 07/19/2017 CLINICAL DATA:  Elevated liver enzymes EXAM: ULTRASOUND ABDOMEN LIMITED RIGHT UPPER QUADRANT COMPARISON:  None. FINDINGS: Gallbladder: Gallbladder nondistended. Several echogenic gallstones present measuring up to 5 mm. Small amount of sludge. No pericholecystic fluid. No gallbladder wall thickening. Negative sonographic Murphy's sign. Tenderness over gallbladder. Common bile duct: Diameter: Mildly dilated from 7-8 mm Liver: No focal lesion identified. Within normal limits in parenchymal echogenicity. Portal vein is patent on color Doppler imaging with normal direction of blood flow towards the liver. IMPRESSION: 1. Cholelithiasis and dilated common bile duct. 2. Tenderness over the gallbladder without frank Murphy sign. 3. Recommend correlation for cholecystitis or biliary  obstruction. Electronically Signed   By: Suzy Bouchard M.D.   On: 07/19/2017 23:51     Not all labs, radiology exams or other studies done during hospitalization come through on my EPIC note; however they are reviewed by me.    Assessment and Plan:  Cholecystitis/cholelithiasis--patient underwent laparoscopic cholecystectomy and tolerated procedure well SNF -admitted back to skilled nursing facility for residential care  Anxiety SNF -appears controlled; continue Xanax 0.5 mg twice daily  Depression SNF -no apparent problem; continue Lexapro 5 mg daily  Polyneuropathy SNF -no complaint of; continue Neurontin 600 mg 3 times daily   Time spent greater than 35 minutes;> 50% of time with patient was spent reviewing records, labs, tests and studies, counseling and developing plan of care  Webb Silversmith D. [name], MD

## 2017-07-24 DIAGNOSIS — R45851 Suicidal ideations: Secondary | ICD-10-CM | POA: Diagnosis not present

## 2017-07-24 DIAGNOSIS — F419 Anxiety disorder, unspecified: Secondary | ICD-10-CM | POA: Diagnosis not present

## 2017-07-24 DIAGNOSIS — F329 Major depressive disorder, single episode, unspecified: Secondary | ICD-10-CM | POA: Diagnosis not present

## 2017-07-26 ENCOUNTER — Encounter: Payer: Self-pay | Admitting: Internal Medicine

## 2017-07-26 NOTE — Assessment & Plan Note (Signed)
Reasonably well-controlled; continue Lexapro 5 mg p.o. daily

## 2017-07-26 NOTE — Assessment & Plan Note (Signed)
Patient still with moderately severe mental compromise from her traumatic brain injury; continue supportive care

## 2017-07-26 NOTE — Assessment & Plan Note (Signed)
Well-controlled; continue Xanax 0.5 mg twice daily

## 2017-07-28 ENCOUNTER — Encounter: Payer: Self-pay | Admitting: *Deleted

## 2017-07-28 LAB — BASIC METABOLIC PANEL
BUN: 12 (ref 4–21)
Calcium: 8.1
Creat: 0.39
EGFR (Non-African Amer.): >90
GLUCOSE: 91
POTASSIUM: 3.7
SODIUM: 145

## 2017-07-28 LAB — CBC
HCT: 35
HEMOGLOBIN: 11.3
MCV: 85.1
WBC: 8.7
platelet count: 262

## 2017-07-31 ENCOUNTER — Encounter: Payer: Self-pay | Admitting: Internal Medicine

## 2017-07-31 DIAGNOSIS — K819 Cholecystitis, unspecified: Secondary | ICD-10-CM | POA: Insufficient documentation

## 2017-08-06 ENCOUNTER — Non-Acute Institutional Stay (SKILLED_NURSING_FACILITY): Payer: Medicare Other

## 2017-08-06 DIAGNOSIS — Z0000 Encounter for general adult medical examination without abnormal findings: Secondary | ICD-10-CM | POA: Diagnosis not present

## 2017-08-06 NOTE — Patient Instructions (Signed)
Autumn Bryant , Thank you for taking time to come for your Medicare Wellness Visit. I appreciate your ongoing commitment to your health goals. Please review the following plan we discussed and let me know if I can assist you in the future.   Screening recommendations/referrals: Colonoscopy excluded, over age 80 Mammogram excluded, over age 49 Bone Density due, ordered Recommended yearly ophthalmology/optometry visit for glaucoma screening and checkup Recommended yearly dental visit for hygiene and checkup  Vaccinations: Influenza vaccine up to date, completed Pneumococcal vaccine up to date, completeld Tdap vaccine up to date, due 03/31/2026 Shingles vaccine not in past records    Advanced directives: Need a copy for chart  Conditions/risks identified: none  Next appointment: Dr. [name] makes rounds   Preventive Care 33 Years and Older, Female Preventive care refers to lifestyle choices and visits with your health care provider that can promote health and wellness. What does preventive care include?  A yearly physical exam. This is also called an annual well check.  Dental exams once or twice a year.  Routine eye exams. Ask your health care provider how often you should have your eyes checked.  Personal lifestyle choices, including:  Daily care of your teeth and gums.  Regular physical activity.  Eating a healthy diet.  Avoiding tobacco and drug use.  Limiting alcohol use.  Practicing safe sex.  Taking low-dose aspirin every day.  Taking vitamin and mineral supplements as recommended by your health care provider. What happens during an annual well check? The services and screenings done by your health care provider during your annual well check will depend on your age, overall health, lifestyle risk factors, and family history of disease. Counseling  Your health care provider may ask you questions about your:  Alcohol use.  Tobacco use.  Drug  use.  Emotional well-being.  Home and relationship well-being.  Sexual activity.  Eating habits.  History of falls.  Memory and ability to understand (cognition).  Work and work Statistician.  Reproductive health. Screening  You may have the following tests or measurements:  Height, weight, and BMI.  Blood pressure.  Lipid and cholesterol levels. These may be checked every 5 years, or more frequently if you are over 20 years old.  Skin check.  Lung cancer screening. You may have this screening every year starting at age 48 if you have a 30-pack-year history of smoking and currently smoke or have quit within the past 15 years.  Fecal occult blood test (FOBT) of the stool. You may have this test every year starting at age 31.  Flexible sigmoidoscopy or colonoscopy. You may have a sigmoidoscopy every 5 years or a colonoscopy every 10 years starting at age 42.  Hepatitis C blood test.  Hepatitis B blood test.  Sexually transmitted disease (STD) testing.  Diabetes screening. This is done by checking your blood sugar (glucose) after you have not eaten for a while (fasting). You may have this done every 1-3 years.  Bone density scan. This is done to screen for osteoporosis. You may have this done starting at age 89.  Mammogram. This may be done every 1-2 years. Talk to your health care provider about how often you should have regular mammograms. Talk with your health care provider about your test results, treatment options, and if necessary, the need for more tests. Vaccines  Your health care provider may recommend certain vaccines, such as:  Influenza vaccine. This is recommended every year.  Tetanus, diphtheria, and acellular pertussis (Tdap, Td)  vaccine. You may need a Td booster every 10 years.  Zoster vaccine. You may need this after age 9.  Pneumococcal 13-valent conjugate (PCV13) vaccine. One dose is recommended after age 80.  Pneumococcal polysaccharide  (PPSV23) vaccine. One dose is recommended after age 57. Talk to your health care provider about which screenings and vaccines you need and how often you need them. This information is not intended to replace advice given to you by your health care provider. Make sure you discuss any questions you have with your health care provider. Document Released: 02/17/2015 Document Revised: 10/11/2015 Document Reviewed: 11/22/2014 Elsevier Interactive Patient Education  2017 [place] Prevention in the Home Falls can cause injuries. They can happen to people of all ages. There are many things you can do to make your home safe and to help prevent falls. What can I do on the outside of my home?  Regularly fix the edges of walkways and driveways and fix any cracks.  Remove anything that might make you trip as you walk through a door, such as a raised step or threshold.  Trim any bushes or trees on the path to your home.  Use bright outdoor lighting.  Clear any walking paths of anything that might make someone trip, such as rocks or tools.  Regularly check to see if handrails are loose or broken. Make sure that both sides of any steps have handrails.  Any raised decks and porches should have guardrails on the edges.  Have any leaves, snow, or ice cleared regularly.  Use sand or salt on walking paths during winter.  Clean up any spills in your garage right away. This includes oil or grease spills. What can I do in the bathroom?  Use night lights.  Install grab bars by the toilet and in the tub and shower. Do not use towel bars as grab bars.  Use non-skid mats or decals in the tub or shower.  If you need to sit down in the shower, use a plastic, non-slip stool.  Keep the floor dry. Clean up any water that spills on the floor as soon as it happens.  Remove soap buildup in the tub or shower regularly.  Attach bath mats securely with double-sided non-slip rug tape.  Do not have  throw rugs and other things on the floor that can make you trip. What can I do in the bedroom?  Use night lights.  Make sure that you have a light by your bed that is easy to reach.  Do not use any sheets or blankets that are too big for your bed. They should not hang down onto the floor.  Have a firm chair that has side arms. You can use this for support while you get dressed.  Do not have throw rugs and other things on the floor that can make you trip. What can I do in the kitchen?  Clean up any spills right away.  Avoid walking on wet floors.  Keep items that you use a lot in easy-to-reach places.  If you need to reach something above you, use a strong step stool that has a grab bar.  Keep electrical cords out of the way.  Do not use floor polish or wax that makes floors slippery. If you must use wax, use non-skid floor wax.  Do not have throw rugs and other things on the floor that can make you trip. What can I do with my stairs?  Do not leave  any items on the stairs.  Make sure that there are handrails on both sides of the stairs and use them. Fix handrails that are broken or loose. Make sure that handrails are as long as the stairways.  Check any carpeting to make sure that it is firmly attached to the stairs. Fix any carpet that is loose or worn.  Avoid having throw rugs at the top or bottom of the stairs. If you do have throw rugs, attach them to the floor with carpet tape.  Make sure that you have a light switch at the top of the stairs and the bottom of the stairs. If you do not have them, ask someone to add them for you. What else can I do to help prevent falls?  Wear shoes that:  Do not have high heels.  Have rubber bottoms.  Are comfortable and fit you well.  Are closed at the toe. Do not wear sandals.  If you use a stepladder:  Make sure that it is fully opened. Do not climb a closed stepladder.  Make sure that both sides of the stepladder are  locked into place.  Ask someone to hold it for you, if possible.  Clearly mark and make sure that you can see:  Any grab bars or handrails.  First and last steps.  Where the edge of each step is.  Use tools that help you move around (mobility aids) if they are needed. These include:  Canes.  Walkers.  Scooters.  Crutches.  Turn on the lights when you go into a dark area. Replace any light bulbs as soon as they burn out.  Set up your furniture so you have a clear path. Avoid moving your furniture around.  If any of your floors are uneven, fix them.  If there are any pets around you, be aware of where they are.  Review your medicines with your doctor. Some medicines can make you feel dizzy. This can increase your chance of falling. Ask your doctor what other things that you can do to help prevent falls. This information is not intended to replace advice given to you by your health care provider. Make sure you discuss any questions you have with your health care provider. Document Released: 11/17/2008 Document Revised: 06/29/2015 Document Reviewed: 02/25/2014 Elsevier Interactive Patient Education  2017 [employer].

## 2017-08-06 NOTE — Progress Notes (Signed)
Subjective:   ZIANNA DERCOLE is a 82 y.o. female who presents for Medicare Annual (Subsequent) preventive examination at [facility]; incapacitated patient unable to answer questions appropriately   Last AWV-08/02/2017    Objective:     Vitals: BP 122/60 (BP Location: Left Arm, Patient Position: Sitting)   Pulse 82   Temp 97.9 F (36.6 C) (Oral)   Ht 5\' 5"  (1.651 m)   Wt 172 lb (78 kg)   LMP  (LMP Unknown)   SpO2 93%   BMI 28.62 kg/m   Body mass index is 28.62 kg/m.  Advanced Directives 08/06/2017 07/23/2017 07/19/2017 07/16/2017 05/29/2017 04/21/2017 03/28/2017  Does Patient Have a Medical Advance Directive? No No No No No No No  Would patient like information on creating a medical advance directive? No - Patient declined - No - Patient declined - - - -    Tobacco Social History   Tobacco Use  Smoking Status Never Smoker  Smokeless Tobacco Never Used     Counseling given: Not Answered   Clinical Intake:  Pre-visit preparation completed: No  Pain : No/denies pain     Nutritional Risks: None Diabetes: No  How often do you need to have someone help you when you read instructions, pamphlets, or other written materials from your doctor or pharmacy?: 4 - Often  Interpreter Needed?: No  Information entered by :: Tyson Dense, RN  Past Medical History:  Diagnosis Date  . Anasarca 05/21/2016  . Anxiety 11/03/2016  . Closed TBI (traumatic brain injury) ([facility]) 04/21/2016  . Dysphagia 07/13/2016  . Gout   . Hypertension   . Obesity (BMI 30.0-34.9) 05/21/2016  . Status post insertion of percutaneous endoscopic gastrostomy (PEG) tube ([place]) 04/21/2016   Past Surgical History:  Procedure Laterality Date  . CHOLECYSTECTOMY N/A 07/21/2017   Procedure: LAPAROSCOPIC CHOLECYSTECTOMY;  Surgeon: Erroll Luna, MD;  Location: [facility];  Service: General;  Laterality: N/A;  . INTRAMEDULLARY (IM) NAIL INTERTROCHANTERIC Right 12/27/2016   Procedure: INTRAMEDULLARY (IM) NAIL  INTERTROCHANTRIC RIGHT HIP;  Surgeon: Paralee Cancel, MD;  Location: [facility];  Service: Orthopedics;  Laterality: Right;  . MASTECTOMY     Family History  Problem Relation Age of Onset  . Asthma Mother   . Anesthesia problems Neg Hx   . Arthritis Neg Hx   . Cancer Neg Hx   . Cerebral palsy Neg Hx   . Clotting disorder Neg Hx   . Club foot Neg Hx   . Collagen disease Neg Hx   . Deep vein thrombosis Neg Hx   . Gait disorder Neg Hx   . Heart disease Neg Hx   . Gout Neg Hx   . Diabetes Neg Hx   . Hip dysplasia Neg Hx   . Hip fracture Neg Hx   . Hypermobility Neg Hx   . Hypertension Neg Hx   . Osteoporosis Neg Hx   . Other Neg Hx   . Stroke Neg Hx   . Thyroid disease Neg Hx   . Vasculitis Neg Hx   . Spina bifida Neg Hx   . Scoliosis Neg Hx   . Rheumatologic disease Neg Hx   . Pulmonary embolism Neg Hx    Social History   Socioeconomic History  . Marital status: Widowed    Spouse name: Not on file  . Number of children: 1  . Years of education: 11th grade  . Highest education level: Not on file  Occupational History  . Occupation: retired  Social Needs  . Financial resource strain: Not hard at all  . Food insecurity:    Worry: Never true    Inability: Never true  . Transportation needs:    Medical: No    Non-medical: No  Tobacco Use  . Smoking status: Never Smoker  . Smokeless tobacco: Never Used  Substance and Sexual Activity  . Alcohol use: No  . Drug use: No  . Sexual activity: Never  Lifestyle  . Physical activity:    Days per week: 0 days    Minutes per session: 0 min  . Stress: Not at all  Relationships  . Social connections:    Talks on phone: Not on file    Gets together: Not on file    Attends religious service: Never    Active member of club or organization: No    Attends meetings of clubs or organizations: Never    Relationship status: Widowed  Other Topics Concern  . Not on file  Social History Narrative   Admitted to [facility] 04/15/16   Widowed   Never smoked   Alcohol none   Full code    Outpatient Encounter Medications as of 08/06/2017  Medication Sig  . ALPRAZolam (XANAX) 0.5 MG tablet Take 0.5 mg by mouth 2 (two) times daily.  . bimatoprost (LUMIGAN) 0.01 % SOLN Place 1 drop into both eyes at bedtime. For glaucoma  . bisacodyl (DULCOLAX) 10 MG suppository Place 10 mg rectally daily as needed (constipation not relieved by MOM).  . ENSURE (ENSURE) Take 237 mLs by mouth 3 (three) times daily between meals.  Marland Kitchen escitalopram (LEXAPRO) 5 MG tablet Take 5 mg by mouth daily.  . furosemide (LASIX) 20 MG tablet Take 20 mg by mouth daily.  Marland Kitchen gabapentin (NEURONTIN) 600 MG tablet Take 600 mg by mouth 3 (three) times daily.  . Magnesium Hydroxide (MILK OF MAGNESIA PO) Take 30 mLs by mouth daily as needed (if no BM in 3 days).  . methocarbamol (ROBAXIN) 500 MG tablet Take 1,000 mg by mouth 3 (three) times daily. For  muscle spasms  . oxyCODONE (OXY IR/ROXICODONE) 5 MG immediate release tablet Take 1 tablet (5 mg total) by mouth every 4 (four) hours as needed for moderate pain.  Marland Kitchen oxyCODONE-acetaminophen (PERCOCET) 10-325 MG tablet Take 1 tablet by mouth every 8 (eight) hours.  . sennosides-docusate sodium (SENOKOT-S) 8.6-50 MG tablet Take 2 tablets by mouth 2 (two) times daily as needed for constipation.  . Sodium Phosphates (FLEET ENEMA RE) Place 1 each rectally daily as needed (constipation not relieved by bisacodyl suppository).   No facility-administered encounter medications on file as of 08/06/2017.     Activities of Daily Living In your present state of health, do you have any difficulty performing the following activities: 08/06/2017 07/20/2017  Hearing? N N  Vision? N N  Difficulty concentrating or making decisions? Tempie Donning  Walking or climbing stairs? Y Y  Dressing or bathing? Y Y  Doing errands, shopping? Y -  Conservation officer, nature and eating ? Y -  Using the Toilet? Y -  In the past six months, have you accidently  leaked urine? Y -  Do you have problems with loss of bowel control? Y -  Managing your Medications? Y -  Managing your Finances? Y -  Housekeeping or managing your Housekeeping? Y -  Some recent data might be hidden    [name], [name]  Assessment:   This is a routine wellness examination for [place].  Exercise Activities and Dietary recommendations Current Exercise Habits: The patient does not participate in regular exercise at present, Exercise limited by: orthopedic condition(s)  Goals    None      Fall Risk Fall Risk  08/06/2017 08/02/2016  Falls in the past year? Yes Yes  Number falls in past yr: - 1  Injury with Fall? Yes Yes  Comment hip fracture R foot   Is the patient's home free of loose throw rugs in walkways, pet beds, electrical cords, etc?   yes      Grab bars in the bathroom? yes      Handrails on the stairs?   yes      Adequate lighting?   yes  Timed Get Up and Go performed: unambulatory  Depression Screen PHQ 2/9 Scores 08/06/2017 08/02/2016  PHQ - 2 Score 0 0     Cognitive Function     6CIT Screen 08/06/2017 08/02/2016  What Year? 4 points 4 points  What month? 3 points 0 points  What time? 3 points 3 points  Count back from 20 0 points 0 points  Months in reverse 4 points 0 points  Repeat phrase 10 points 10 points  Total Score 24 17    Immunization History  Administered Date(s) Administered  . Influenza-Unspecified 12/19/2016  . PPD Test 04/29/2016  . Pneumococcal Conjugate-13 08/06/2016  . Pneumococcal Polysaccharide-23 04/15/2016  . Tdap 03/31/2016    Qualifies for Shingles Vaccine? Not in past records  Screening Tests Health Maintenance  Topic Date Due  . DEXA SCAN  10/02/2002  . PNA vac Low Risk Adult (2 of 2 - PCV13) 04/15/2017  . INFLUENZA VACCINE  09/04/2017  . TETANUS/TDAP  03/31/2026    Cancer Screenings: Lung: Low Dose CT Chest recommended if Age 59-80 years,  30 pack-year currently smoking OR have quit w/in 15years. Patient does not qualify. Breast:  Up to date on Mammogram? Yes   Up to date of Bone Density/Dexa? Yes Colorectal: up to date  Additional Screenings:  Hepatitis C Screening: declined     Plan:    I have personally reviewed and addressed the Medicare Annual Wellness questionnaire and have noted the following in the patient's chart:  A. Medical and social history B. Use of alcohol, tobacco or illicit drugs  C. Current medications and supplements D. Functional ability and status E.  Nutritional status F.  Physical activity G. Advance directives H. List of other physicians I.  Hospitalizations, surgeries, and ER visits in previous 12 months J.  [place] to include hearing, vision, cognitive, depression L. Referrals and appointments - none  In addition, I am unable to review and discuss with incapacitated patient certain preventive protocols, quality metrics, and best practice recommendations. A written personalized care plan for preventive services as well as general preventive health recommendations were provided to patient.   See attached scanned questionnaire for additional information.   Signed,   Tyson Dense, RN Nurse Health Advisor  Patient Concerns: None

## 2017-08-08 ENCOUNTER — Encounter: Payer: Self-pay | Admitting: Internal Medicine

## 2017-08-08 NOTE — Assessment & Plan Note (Signed)
She continues to eat her diet without problems; continue to monitor

## 2017-08-08 NOTE — Assessment & Plan Note (Signed)
No reports of problems; continue Neurontin 600 mg 3 times a day

## 2017-08-08 NOTE — Assessment & Plan Note (Signed)
Controlled; continue Lasix 20 mg daily

## 2017-08-09 ENCOUNTER — Encounter: Payer: Self-pay | Admitting: Internal Medicine

## 2017-08-12 ENCOUNTER — Encounter: Payer: Self-pay | Admitting: Internal Medicine

## 2017-08-12 ENCOUNTER — Non-Acute Institutional Stay (SKILLED_NURSING_FACILITY): Payer: Medicare Other | Admitting: Internal Medicine

## 2017-08-12 DIAGNOSIS — S069X6D Unspecified intracranial injury with loss of consciousness greater than 24 hours without return to pre-existing conscious level with patient surviving, subsequent encounter: Secondary | ICD-10-CM

## 2017-08-12 DIAGNOSIS — E669 Obesity, unspecified: Secondary | ICD-10-CM | POA: Diagnosis not present

## 2017-08-12 DIAGNOSIS — F419 Anxiety disorder, unspecified: Secondary | ICD-10-CM | POA: Diagnosis not present

## 2017-08-12 DIAGNOSIS — E66811 Obesity, class 1: Secondary | ICD-10-CM

## 2017-08-12 NOTE — Progress Notes (Signed)
Location:  [facility] Room Number: 211D Place of Service:  SNF [phone])  [name], MD  [name], [name]  Extended Emergency Contact Information Primary Emergency Contact: [name] Address: [address]          [place], [place] [address] [name] of [place] Phone: [phone] Relation: Son Secondary Emergency Contact: [name],[name]          [place], [place] Relation: Sister    Allergies: Demerol [meperidine]  Chief Complaint  Patient presents with  . Medical Management of Chronic Issues    Routine Visit    HPI: Patient is 82 y.o. female who is being seen for routine issues of TBI, obesity, and anxiety.  Past Medical History:  Diagnosis Date  . Anasarca 05/21/2016  . Anxiety 11/03/2016  . Closed TBI (traumatic brain injury) ([facility]) 04/21/2016  . Dysphagia 07/13/2016  . Gout   . Hypertension   . Obesity (BMI 30.0-34.9) 05/21/2016  . Status post insertion of percutaneous endoscopic gastrostomy (PEG) tube ([place]) 04/21/2016    Past Surgical History:  Procedure Laterality Date  . CHOLECYSTECTOMY N/A 07/21/2017   Procedure: LAPAROSCOPIC CHOLECYSTECTOMY;  Surgeon: Erroll Luna, MD;  Location: [facility];  Service: General;  Laterality: N/A;  . INTRAMEDULLARY (IM) NAIL INTERTROCHANTERIC Right 12/27/2016   Procedure: INTRAMEDULLARY (IM) NAIL INTERTROCHANTRIC RIGHT HIP;  Surgeon: Paralee Cancel, MD;  [facility];  Service: Orthopedics;  Laterality: Right;  . MASTECTOMY      Allergies as of 08/12/2017      Reactions   Demerol [meperidine] Swelling      Medication List        Accurate as of 08/12/17 11:59 PM. Always use your most recent med list.          ALPRAZolam 0.5 MG tablet Commonly known as:  XANAX Take 0.5 mg by mouth 2 (two) times daily.   bimatoprost 0.01 % Soln Commonly known as:  LUMIGAN Place 1 drop into both eyes at  bedtime. For glaucoma   bisacodyl 10 MG suppository Commonly known as:  DULCOLAX Place 10 mg rectally daily as needed (constipation not relieved by MOM).   ENSURE Take 237 mLs by mouth 3 (three) times daily between meals.   escitalopram 5 MG tablet Commonly known as:  LEXAPRO Take 5 mg by mouth daily.   FLEET ENEMA RE Place 1 each rectally daily as needed (constipation not relieved by bisacodyl suppository).   furosemide 20 MG tablet Commonly known as:  LASIX Take 20 mg by mouth daily.   gabapentin 600 MG tablet Commonly known as:  NEURONTIN Take 600 mg by mouth 3 (three) times daily.   methocarbamol 500 MG tablet Commonly known as:  ROBAXIN Take 1,000 mg by mouth 3 (three) times daily. For  muscle spasms   MILK OF MAGNESIA PO Take 30 mLs by mouth daily as needed (if no BM in 3 days).   oxyCODONE-acetaminophen 10-325 MG tablet Commonly known as:  PERCOCET Take 1 tablet by mouth every 8 (eight) hours.   sennosides-docusate sodium 8.6-50 MG tablet Commonly known as:  SENOKOT-S Take 2 tablets by mouth 2 (two) times daily as needed for constipation.       No orders of the defined types were placed in this encounter.   Immunization History  Administered Date(s) Administered  . Influenza-Unspecified 12/19/2016  . PPD Test 04/29/2016  . Pneumococcal Conjugate-13 08/06/2016  .  Pneumococcal Polysaccharide-23 04/15/2016  . Tdap 03/31/2016    Social History   Tobacco Use  . Smoking status: Never Smoker  . Smokeless tobacco: Never Used  Substance Use Topics  . Alcohol use: No    Review of Systems  DATA OBTAINED: from patient-very limited; nursing-no acute concerns GENERAL:  no fevers, fatigue, appetite changes SKIN: No itching, rash HEENT: No complaint RESPIRATORY: No cough, wheezing, SOB CARDIAC: No chest pain, palpitations, lower extremity edema  GI: No abdominal pain, No N/V/D or constipation, No heartburn or reflux  GU: No dysuria, frequency or urgency,  or incontinence  MUSCULOSKELETAL: No unrelieved bone/joint pain NEUROLOGIC: No headache, dizziness  PSYCHIATRIC: No overt anxiety or sadness  Vitals:   08/12/17 1302  BP: 104/69  Pulse: 71  Resp: 16  Temp: 97.8 F (36.6 C)   Body mass index is 28.62 kg/m. Physical Exam  GENERAL APPEARANCE: Alert, conversant, No acute distress  SKIN: No diaphoresis rash HEENT: Unremarkable RESPIRATORY: Breathing is even, unlabored. Lung sounds are clear   CARDIOVASCULAR: Heart RRR no murmurs, rubs or gallops. No peripheral edema  GASTROINTESTINAL: Abdomen is soft, non-tender, not distended w/ normal bowel sounds.  GENITOURINARY: Bladder non tender, not distended  MUSCULOSKELETAL: No abnormal joints or musculature NEUROLOGIC: Cranial nerves 2-12 grossly intact. Moves all extremities PSYCHIATRIC: Mood and affect appropriate to situation with some deficit, no behavioral issues  Patient Active Problem List   Diagnosis Date Noted  . Cholecystitis 07/31/2017  . Cholecystitis, acute with cholelithiasis 07/20/2017  . Pressure injury of skin 07/20/2017  . History of total right hip arthroplasty 01/05/2017  . Acute blood loss as cause of postoperative anemia 01/05/2017  . Escherichia coli urinary tract infection 01/05/2017  . Acute encephalopathy 01/05/2017  . Depression, recurrent ([facility]) 01/05/2017  . Polyneuropathy 01/05/2017  . Closed right hip fracture ([facility]) 12/25/2016  . Anxiety 11/03/2016  . Dysphagia 07/13/2016  . Anasarca 05/21/2016  . Obesity (BMI 30.0-34.9) 05/21/2016  . Closed TBI (traumatic brain injury) ([facility]) 04/21/2016  . Status post insertion of percutaneous endoscopic gastrostomy (PEG) tube ([facility]) 04/21/2016  . Facial laceration 04/21/2016  . Cheek wound, right, subsequent encounter 04/21/2016  . Urinary tract infection due to extended-spectrum beta lactamase (ESBL) producing Escherichia coli 04/21/2016  . Hypotension 04/21/2016  . Gout   . Hypertension     CMP     Component  Value Date/Time   NA 145 07/28/2017   K 3.7 07/28/2017   CL 107 07/22/2017 1002   CO2 26 07/22/2017 1002   GLUCOSE 156 (H) 07/22/2017 1002   BUN 12 07/28/2017   CREATININE 0.39 07/28/2017   CALCIUM 8.1 07/28/2017   PROT 5.7 (L) 07/22/2017 1002   ALBUMIN 2.6 (L) 07/22/2017 1002   AST 28 07/22/2017 1002   ALT 36 07/22/2017 1002   ALKPHOS 97 07/22/2017 1002   BILITOT 0.7 07/22/2017 1002   GFRNONAA >90 07/28/2017   GFRAA >60 07/22/2017 1002   Recent Labs    07/19/17 2107 07/20/17 0537 07/22/17 1002 07/28/17  NA 143 141 141 145  K 3.1* 2.9* 4.3 3.7  CL 101 104 107  --   CO2 29 28 26   --   GLUCOSE 92 93 156*  --   BUN 10 7 5* 12  CREATININE 0.58 0.45 0.68 0.39  CALCIUM 8.6* 8.4* 8.5* 8.1   Recent Labs    07/19/17 2107 07/20/17 0537 07/22/17 1002  AST 57* 39 28  ALT 80* 63* 36  ALKPHOS 134* 115 97  BILITOT 1.2 1.0 0.7  PROT 6.4* 5.8* 5.7*  ALBUMIN 3.0* 2.5* 2.6*   Recent Labs    12/25/16 1800  07/19/17 2107 07/20/17 0537 07/22/17 1002 07/28/17  WBC 10.6*   < > 10.7* 7.8 20.0* 8.7  NEUTROABS 8.7*  --  8.2*  --   --   --   HGB 11.5*   < > 12.9 12.0 12.3 11.3  HCT 35.4*   < > 41.9 38.4 39.7 35.0  MCV 89.6   < > 89.7 88.9 90.6 85.1  PLT 172   < > 181 192 220  --    < > = values in this interval not displayed.   No results for input(s): CHOL, LDLCALC, TRIG in the last 8760 hours.  Invalid input(s): HCL No results found for: MICROALBUR Lab Results  Component Value Date   TSH 1.68 08/06/2013   No results found for: HGBA1C No results found for: CHOL, HDL, LDLCALC, LDLDIRECT, TRIG, CHOLHDL  Significant Diagnostic Results in last 30 days:  No results found.  Assessment and Plan  Closed TBI (traumatic brain injury) ([facility]) Moderate to severe mental compromise; continue supportive care  Obesity (BMI 30.0-34.9) Continue to monitor weight and some weight loss is desirable  Anxiety Controlled on Xanax 0.5 mg twice daily; continue current regimen     Anne  D. [name], MD

## 2017-08-23 ENCOUNTER — Encounter: Payer: Self-pay | Admitting: Internal Medicine

## 2017-08-23 NOTE — Assessment & Plan Note (Signed)
Moderate to severe mental compromise; continue supportive care

## 2017-08-23 NOTE — Assessment & Plan Note (Signed)
Continue to monitor weight and some weight loss is desirable

## 2017-08-23 NOTE — Assessment & Plan Note (Signed)
Controlled on Xanax 0.5 mg twice daily; continue current regimen

## 2017-08-30 DIAGNOSIS — S069X0D Unspecified intracranial injury without loss of consciousness, subsequent encounter: Secondary | ICD-10-CM | POA: Diagnosis not present

## 2017-08-30 DIAGNOSIS — M6281 Muscle weakness (generalized): Secondary | ICD-10-CM | POA: Diagnosis not present

## 2017-08-31 DIAGNOSIS — M6281 Muscle weakness (generalized): Secondary | ICD-10-CM | POA: Diagnosis not present

## 2017-08-31 DIAGNOSIS — S069X0D Unspecified intracranial injury without loss of consciousness, subsequent encounter: Secondary | ICD-10-CM | POA: Diagnosis not present

## 2017-09-02 DIAGNOSIS — F419 Anxiety disorder, unspecified: Secondary | ICD-10-CM | POA: Diagnosis not present

## 2017-09-02 DIAGNOSIS — R630 Anorexia: Secondary | ICD-10-CM | POA: Diagnosis not present

## 2017-09-02 DIAGNOSIS — F329 Major depressive disorder, single episode, unspecified: Secondary | ICD-10-CM | POA: Diagnosis not present

## 2017-09-02 DIAGNOSIS — S069X0D Unspecified intracranial injury without loss of consciousness, subsequent encounter: Secondary | ICD-10-CM | POA: Diagnosis not present

## 2017-09-02 DIAGNOSIS — R4589 Other symptoms and signs involving emotional state: Secondary | ICD-10-CM | POA: Diagnosis not present

## 2017-09-02 DIAGNOSIS — M6281 Muscle weakness (generalized): Secondary | ICD-10-CM | POA: Diagnosis not present

## 2017-09-03 DIAGNOSIS — S069X0D Unspecified intracranial injury without loss of consciousness, subsequent encounter: Secondary | ICD-10-CM | POA: Diagnosis not present

## 2017-09-03 DIAGNOSIS — M6281 Muscle weakness (generalized): Secondary | ICD-10-CM | POA: Diagnosis not present

## 2017-09-04 DIAGNOSIS — S069X0D Unspecified intracranial injury without loss of consciousness, subsequent encounter: Secondary | ICD-10-CM | POA: Diagnosis not present

## 2017-09-04 DIAGNOSIS — M6281 Muscle weakness (generalized): Secondary | ICD-10-CM | POA: Diagnosis not present

## 2017-09-06 DIAGNOSIS — M6281 Muscle weakness (generalized): Secondary | ICD-10-CM | POA: Diagnosis not present

## 2017-09-06 DIAGNOSIS — S069X0D Unspecified intracranial injury without loss of consciousness, subsequent encounter: Secondary | ICD-10-CM | POA: Diagnosis not present

## 2017-09-08 DIAGNOSIS — L89153 Pressure ulcer of sacral region, stage 3: Secondary | ICD-10-CM | POA: Diagnosis not present

## 2017-09-08 DIAGNOSIS — S069X0D Unspecified intracranial injury without loss of consciousness, subsequent encounter: Secondary | ICD-10-CM | POA: Diagnosis not present

## 2017-09-08 DIAGNOSIS — M6281 Muscle weakness (generalized): Secondary | ICD-10-CM | POA: Diagnosis not present

## 2017-09-09 DIAGNOSIS — M6281 Muscle weakness (generalized): Secondary | ICD-10-CM | POA: Diagnosis not present

## 2017-09-09 DIAGNOSIS — S069X0D Unspecified intracranial injury without loss of consciousness, subsequent encounter: Secondary | ICD-10-CM | POA: Diagnosis not present

## 2017-09-10 DIAGNOSIS — M6281 Muscle weakness (generalized): Secondary | ICD-10-CM | POA: Diagnosis not present

## 2017-09-10 DIAGNOSIS — S069X0D Unspecified intracranial injury without loss of consciousness, subsequent encounter: Secondary | ICD-10-CM | POA: Diagnosis not present

## 2017-09-11 DIAGNOSIS — S069X0D Unspecified intracranial injury without loss of consciousness, subsequent encounter: Secondary | ICD-10-CM | POA: Diagnosis not present

## 2017-09-11 DIAGNOSIS — M6281 Muscle weakness (generalized): Secondary | ICD-10-CM | POA: Diagnosis not present

## 2017-09-15 DIAGNOSIS — L89153 Pressure ulcer of sacral region, stage 3: Secondary | ICD-10-CM | POA: Diagnosis not present

## 2017-09-17 ENCOUNTER — Encounter: Payer: Self-pay | Admitting: Internal Medicine

## 2017-09-17 ENCOUNTER — Non-Acute Institutional Stay (SKILLED_NURSING_FACILITY): Payer: Medicare Other | Admitting: Internal Medicine

## 2017-09-17 DIAGNOSIS — F339 Major depressive disorder, recurrent, unspecified: Secondary | ICD-10-CM | POA: Diagnosis not present

## 2017-09-17 DIAGNOSIS — M109 Gout, unspecified: Secondary | ICD-10-CM | POA: Diagnosis not present

## 2017-09-17 DIAGNOSIS — G629 Polyneuropathy, unspecified: Secondary | ICD-10-CM | POA: Diagnosis not present

## 2017-09-17 NOTE — Progress Notes (Signed)
Location:  [facility] Room Number: 211D Place of Service:  SNF [phone])  [name], MD  [name], [name]  Extended Emergency Contact Information Primary Emergency Contact: [name] Address: [address]          [place],  [address] [name] of [place] Phone: [phone] Relation: Son Secondary Emergency Contact: [name],[name]          [place], [place] Relation: Sister    Allergies: Demerol [meperidine]  Chief Complaint  Patient presents with  . Medical Management of Chronic Issues    Routine Visit    HPI: Patient is 82 y.o. female who is being seen for routine issues of gout, polyneuropathy, and depression.  Past Medical History:  Diagnosis Date  . Anasarca 05/21/2016  . Anxiety 11/03/2016  . Closed TBI (traumatic brain injury) ([facility]) 04/21/2016  . Dysphagia 07/13/2016  . Gout   . Hypertension   . Obesity (BMI 30.0-34.9) 05/21/2016  . Status post insertion of percutaneous endoscopic gastrostomy (PEG) tube ([facility]) 04/21/2016    Past Surgical History:  Procedure Laterality Date  . CHOLECYSTECTOMY N/A 07/21/2017   Procedure: LAPAROSCOPIC CHOLECYSTECTOMY;  Surgeon: Erroll Luna, MD;  Location: [facility];  Service: General;  Laterality: N/A;  . INTRAMEDULLARY (IM) NAIL INTERTROCHANTERIC Right 12/27/2016   Procedure: INTRAMEDULLARY (IM) NAIL INTERTROCHANTRIC RIGHT HIP;  Surgeon: Paralee Cancel, MD;  [facility];  Service: Orthopedics;  Laterality: Right;  . MASTECTOMY      Allergies as of 09/17/2017      Reactions   Demerol [meperidine] Swelling      Medication List        Accurate as of 09/17/17 11:59 PM. Always use your most recent med list.          ALPRAZolam 0.5 MG tablet Commonly known as:  XANAX Take 0.5 mg by mouth 2 (two) times daily.   bimatoprost 0.01 % Soln Commonly known as:  LUMIGAN Place 1 drop into  both eyes at bedtime. For glaucoma   bisacodyl 10 MG suppository Commonly known as:  DULCOLAX Place 10 mg rectally daily as needed (constipation not relieved by MOM).   escitalopram 5 MG tablet Commonly known as:  LEXAPRO Take 5 mg by mouth daily.   FLEET ENEMA RE Place 1 each rectally daily as needed (constipation not relieved by bisacodyl suppository).   furosemide 20 MG tablet Commonly known as:  LASIX Take 20 mg by mouth daily.   gabapentin 600 MG tablet Commonly known as:  NEURONTIN Take 600 mg by mouth 3 (three) times daily.   methocarbamol 500 MG tablet Commonly known as:  ROBAXIN Take 1,000 mg by mouth 3 (three) times daily. For  muscle spasms   MILK OF MAGNESIA PO Take 30 mLs by mouth daily as needed (if no BM in 3 days).   mirtazapine 7.5 MG tablet Commonly known as:  REMERON Take 7.5 mg by mouth. TAKE 1 TABLET BY MOUTH AT BEDTIME FOR APPETITE STIMULANT   MULTIPLE VITAMINS-MINERALS PO Take by mouth. TAKE 1 TABLET BY MOUTH ONCE DAILY TO PROMOTE WOUND HEALING   oxyCODONE-acetaminophen 10-325 MG tablet Commonly known as:  PERCOCET Take 1 tablet by mouth every 8 (eight) hours.   sennosides-docusate sodium 8.6-50 MG tablet Commonly known as:  SENOKOT-S Take 2 tablets by mouth 2 (two) times daily as needed for constipation.   venlafaxine XR 75 MG 24 hr capsule Commonly  known as:  EFFEXOR-XR Take 75 mg by mouth. TAKE 1 CAPSULE BY MOUTH TWICE A DAY FOR DEPRESSION AND WEIGHT LOSS (DO NOT CRUSH)       No orders of the defined types were placed in this encounter.   Immunization History  Administered Date(s) Administered  . Influenza-Unspecified 12/19/2016  . PPD Test 04/29/2016  . Pneumococcal Conjugate-13 08/06/2016  . Pneumococcal Polysaccharide-23 04/15/2016  . Tdap 03/31/2016    Social History   Tobacco Use  . Smoking status: Never Smoker  . Smokeless tobacco: Never Used  Substance Use Topics  . Alcohol use: No    Review of Systems  DATA  OBTAINED: from patient-limited; nursing- no acute concerns GENERAL:  no fevers, fatigue, appetite changes SKIN: No itching, rash HEENT: No complaint RESPIRATORY: No cough, wheezing, SOB CARDIAC: No chest pain, palpitations, lower extremity edema  GI: No abdominal pain, No N/V/D or constipation, No heartburn or reflux  GU: No dysuria, frequency or urgency, or incontinence  MUSCULOSKELETAL: No unrelieved bone/joint pain NEUROLOGIC: No headache, dizziness  PSYCHIATRIC: No overt anxiety or sadness  Vitals:   09/17/17 1534  BP: (!) 149/82  Pulse: 69  Resp: 18  Temp: (!) 97.4 F (36.3 C)   Body mass index is 26.66 kg/m. Physical Exam  GENERAL APPEARANCE: Alert, conversant, No acute distress  SKIN: No diaphoresis rash HEENT: Unremarkable RESPIRATORY: Breathing is even, unlabored. Lung sounds are clear   CARDIOVASCULAR: Heart RRR no murmurs, rubs or gallops. No peripheral edema  GASTROINTESTINAL: Abdomen is soft, non-tender, not distended w/ normal bowel sounds.  GENITOURINARY: Bladder non tender, not distended  MUSCULOSKELETAL: No abnormal joints or musculature NEUROLOGIC: Cranial nerves 2-12 grossly intact. Moves all extremities PSYCHIATRIC: Mood and affect post TBI, no behavioral issues  Patient Active Problem List   Diagnosis Date Noted  . Cholecystitis 07/31/2017  . Cholecystitis, acute with cholelithiasis 07/20/2017  . Pressure injury of skin 07/20/2017  . History of total right hip arthroplasty 01/05/2017  . Acute blood loss as cause of postoperative anemia 01/05/2017  . Escherichia coli urinary tract infection 01/05/2017  . Acute encephalopathy 01/05/2017  . Depression, recurrent ([facility]) 01/05/2017  . Polyneuropathy 01/05/2017  . Closed right hip fracture ([facility]) 12/25/2016  . Anxiety 11/03/2016  . Dysphagia 07/13/2016  . Anasarca 05/21/2016  . Obesity (BMI 30.0-34.9) 05/21/2016  . Closed TBI (traumatic brain injury) ([facility]) 04/21/2016  . Status post insertion of  percutaneous endoscopic gastrostomy (PEG) tube ([facility]) 04/21/2016  . Facial laceration 04/21/2016  . Cheek wound, right, subsequent encounter 04/21/2016  . Urinary tract infection due to extended-spectrum beta lactamase (ESBL) producing Escherichia coli 04/21/2016  . Hypotension 04/21/2016  . Gout   . Hypertension     CMP     Component Value Date/Time   NA 145 07/28/2017   K 3.7 07/28/2017   CL 107 07/22/2017 1002   CO2 26 07/22/2017 1002   GLUCOSE 156 (H) 07/22/2017 1002   BUN 12 07/28/2017   CREATININE 0.39 07/28/2017   CALCIUM 8.1 07/28/2017   PROT 5.7 (L) 07/22/2017 1002   ALBUMIN 2.6 (L) 07/22/2017 1002   AST 28 07/22/2017 1002   ALT 36 07/22/2017 1002   ALKPHOS 97 07/22/2017 1002   BILITOT 0.7 07/22/2017 1002   GFRNONAA >90 07/28/2017   GFRAA >60 07/22/2017 1002   Recent Labs    07/19/17 2107 07/20/17 0537 07/22/17 1002 07/28/17  NA 143 141 141 145  K 3.1* 2.9* 4.3 3.7  CL 101 104 107  --   CO2 29  28 26  --   GLUCOSE 92 93 156*  --   BUN 10 7 5* 12  CREATININE 0.58 0.45 0.68 0.39  CALCIUM 8.6* 8.4* 8.5* 8.1   Recent Labs    07/19/17 2107 07/20/17 0537 07/22/17 1002  AST 57* 39 28  ALT 80* 63* 36  ALKPHOS 134* 115 97  BILITOT 1.2 1.0 0.7  PROT 6.4* 5.8* 5.7*  ALBUMIN 3.0* 2.5* 2.6*   Recent Labs    12/25/16 1800  07/19/17 2107 07/20/17 0537 07/22/17 1002 07/28/17  WBC 10.6*   < > 10.7* 7.8 20.0* 8.7  NEUTROABS 8.7*  --  8.2*  --   --   --   HGB 11.5*   < > 12.9 12.0 12.3 11.3  HCT 35.4*   < > 41.9 38.4 39.7 35.0  MCV 89.6   < > 89.7 88.9 90.6 85.1  PLT 172   < > 181 192 220  --    < > = values in this interval not displayed.   No results for input(s): CHOL, LDLCALC, TRIG in the last 8760 hours.  Invalid input(s): HCL No results found for: MICROALBUR Lab Results  Component Value Date   TSH 1.68 08/06/2013   No results found for: HGBA1C No results found for: CHOL, HDL, LDLCALC, LDLDIRECT, TRIG, CHOLHDL  Significant Diagnostic Results in  last 30 days:  No results found.  Assessment and Plan  Gout No reported flares; on no meds; monitor  Polyneuropathy No reports of complaints; continue Neurontin 600 mg 3 times daily  Depression, recurrent ([facility]) Appears controlled; continue Lexapro 5 mg daily and Effexor 75 mg daily    Lucifer Soja D. [name], MD

## 2017-09-22 DIAGNOSIS — L89153 Pressure ulcer of sacral region, stage 3: Secondary | ICD-10-CM | POA: Diagnosis not present

## 2017-09-23 ENCOUNTER — Encounter: Payer: Self-pay | Admitting: Internal Medicine

## 2017-09-23 NOTE — Assessment & Plan Note (Signed)
No reported flares; on no meds; monitor

## 2017-09-23 NOTE — Assessment & Plan Note (Signed)
Appears controlled; continue Lexapro 5 mg daily and Effexor 75 mg daily

## 2017-09-23 NOTE — Assessment & Plan Note (Signed)
No reports of complaints; continue Neurontin 600 mg 3 times daily

## 2017-09-29 DIAGNOSIS — L89153 Pressure ulcer of sacral region, stage 3: Secondary | ICD-10-CM | POA: Diagnosis not present

## 2017-10-02 DIAGNOSIS — F419 Anxiety disorder, unspecified: Secondary | ICD-10-CM | POA: Diagnosis not present

## 2017-10-02 DIAGNOSIS — F329 Major depressive disorder, single episode, unspecified: Secondary | ICD-10-CM | POA: Diagnosis not present

## 2017-10-02 DIAGNOSIS — R4589 Other symptoms and signs involving emotional state: Secondary | ICD-10-CM | POA: Diagnosis not present

## 2017-10-06 DIAGNOSIS — L89153 Pressure ulcer of sacral region, stage 3: Secondary | ICD-10-CM | POA: Diagnosis not present

## 2017-10-13 DIAGNOSIS — L89153 Pressure ulcer of sacral region, stage 3: Secondary | ICD-10-CM | POA: Diagnosis not present

## 2017-10-15 ENCOUNTER — Non-Acute Institutional Stay (SKILLED_NURSING_FACILITY): Payer: Medicare Other | Admitting: Internal Medicine

## 2017-10-15 ENCOUNTER — Encounter: Payer: Self-pay | Admitting: Internal Medicine

## 2017-10-15 DIAGNOSIS — S069X6D Unspecified intracranial injury with loss of consciousness greater than 24 hours without return to pre-existing conscious level with patient surviving, subsequent encounter: Secondary | ICD-10-CM

## 2017-10-15 DIAGNOSIS — E669 Obesity, unspecified: Secondary | ICD-10-CM

## 2017-10-15 DIAGNOSIS — I10 Essential (primary) hypertension: Secondary | ICD-10-CM

## 2017-10-15 NOTE — Progress Notes (Signed)
Location:  [facility] Room Number: 211-D Place of Service:  SNF (31)  [name], MD  [name], [name]  Extended Emergency Contact Information Primary Emergency Contact: [name] Address: [address]          [place], [place] [address] [name] of [place] Phone: [phone] Relation: Son Secondary Emergency Contact: [name],[name]          [place], [place] Relation: Sister    Allergies: Demerol [meperidine]  Chief Complaint  Patient presents with  . Medical Management of Chronic Issues    The patient is seen for a routine [facility] visit    HPI: Patient is 82 y.o. female who   Past Medical History:  Diagnosis Date  . Anasarca 05/21/2016  . Anxiety 11/03/2016  . Closed TBI (traumatic brain injury) ([facility]) 04/21/2016  . Dysphagia 07/13/2016  . Gout   . Hypertension   . Obesity (BMI 30.0-34.9) 05/21/2016  . Status post insertion of percutaneous endoscopic gastrostomy (PEG) tube ([facility] [place]) 04/21/2016    Past Surgical History:  Procedure Laterality Date  . CHOLECYSTECTOMY N/A 07/21/2017   Procedure: LAPAROSCOPIC CHOLECYSTECTOMY;  Surgeon: Erroll Luna, MD;  Location: [facility];  Service: General;  Laterality: N/A;  . INTRAMEDULLARY (IM) NAIL INTERTROCHANTERIC Right 12/27/2016   Procedure: INTRAMEDULLARY (IM) NAIL INTERTROCHANTRIC RIGHT HIP;  Surgeon: Paralee Cancel, MD;  [facility];  Service: Orthopedics;  Laterality: Right;  . MASTECTOMY      Allergies as of 10/15/2017      Reactions   Demerol [meperidine] Swelling      Medication List        Accurate as of 10/15/17  3:49 PM. Always use your most recent med list.          ALPRAZolam 0.5 MG tablet Commonly known as:  XANAX Take 0.5 mg by mouth 2 (two) times daily.   bimatoprost 0.01 % Soln Commonly known as:  LUMIGAN Place 1 drop into both eyes at bedtime. For glaucoma   bisacodyl 10 MG suppository Commonly known as:  DULCOLAX Place 10 mg rectally daily as needed (constipation not relieved by MOM).   escitalopram 5 MG tablet Commonly known as:  LEXAPRO Take 5 mg by mouth daily.   feeding supplement (PRO-STAT SUGAR FREE 64) Liqd Take 30 mLs by mouth 2 (two) times daily.   FLEET ENEMA RE Place 1 each rectally daily as needed (constipation not relieved by bisacodyl suppository).   furosemide 20 MG tablet Commonly known as:  LASIX Take 20 mg by mouth daily.   gabapentin 600 MG tablet Commonly known as:  NEURONTIN Take 600 mg by mouth 3 (three) times daily.   MILK OF MAGNESIA PO Take 30 mLs by mouth daily as needed (if no BM in 3 days).   mirtazapine 7.5 MG tablet Commonly known as:  REMERON Take 7.5 mg by mouth. TAKE 1 TABLET BY MOUTH AT BEDTIME FOR APPETITE STIMULANT   MULTIPLE VITAMINS-MINERALS PO Take by mouth. TAKE 1 TABLET BY MOUTH ONCE DAILY TO PROMOTE WOUND HEALING   oxyCODONE-acetaminophen 5-325 MG tablet Commonly known as:  PERCOCET/ROXICET Take 1 tablet by mouth at bedtime.   sennosides-docusate sodium 8.6-50 MG tablet Commonly known as:  SENOKOT-S Take 2 tablets by mouth 2 (two) times daily as needed for constipation.   venlafaxine XR 75 MG 24 hr capsule Commonly known as:  EFFEXOR-XR Take 75 mg by mouth. TAKE  1 CAPSULE BY MOUTH TWICE A DAY FOR DEPRESSION AND WEIGHT LOSS (DO NOT CRUSH)       No orders of the defined types were placed in this encounter.   Immunization History  Administered Date(s) Administered  . Influenza-Unspecified 12/19/2016  . PPD Test 04/29/2016  . Pneumococcal Conjugate-13 08/06/2016  . Pneumococcal Polysaccharide-23 04/15/2016  . Tdap 03/31/2016    Social History   Tobacco Use  . Smoking status: Never Smoker  . Smokeless tobacco: Never Used  Substance Use Topics  . Alcohol use: No    Review of Systems  DATA OBTAINED: from patient, nurse, medical record, family member GENERAL:  no  fevers, fatigue, appetite changes SKIN: No itching, rash HEENT: No complaint RESPIRATORY: No cough, wheezing, SOB CARDIAC: No chest pain, palpitations, lower extremity edema  GI: No abdominal pain, No N/V/D or constipation, No heartburn or reflux  GU: No dysuria, frequency or urgency, or incontinence  MUSCULOSKELETAL: No unrelieved bone/joint pain NEUROLOGIC: No headache, dizziness  PSYCHIATRIC: No overt anxiety or sadness  Vitals:   10/15/17 1504  BP: 129/78  Pulse: 86  Resp: 16  Temp: 97.8 F (36.6 C)  SpO2: 93%   Body mass index is 26.46 kg/m. Physical Exam  GENERAL APPEARANCE: Alert, conversant, No acute distress  SKIN: No diaphoresis rash HEENT: Unremarkable RESPIRATORY: Breathing is even, unlabored. Lung sounds are clear   CARDIOVASCULAR: Heart RRR no murmurs, rubs or gallops. No peripheral edema  GASTROINTESTINAL: Abdomen is soft, non-tender, not distended w/ normal bowel sounds.  GENITOURINARY: Bladder non tender, not distended  MUSCULOSKELETAL: No abnormal joints or musculature NEUROLOGIC: Cranial nerves 2-12 grossly intact. Moves all extremities PSYCHIATRIC: Mood and affect appropriate to situation, no behavioral issues  Patient Active Problem List   Diagnosis Date Noted  . Cholecystitis 07/31/2017  . Cholecystitis, acute with cholelithiasis 07/20/2017  . Pressure injury of skin 07/20/2017  . History of total right hip arthroplasty 01/05/2017  . Acute blood loss as cause of postoperative anemia 01/05/2017  . Escherichia coli urinary tract infection 01/05/2017  . Acute encephalopathy 01/05/2017  . Depression, recurrent ([facility]) 01/05/2017  . Polyneuropathy 01/05/2017  . Closed right hip fracture ([facility]) 12/25/2016  . Anxiety 11/03/2016  . Dysphagia 07/13/2016  . Anasarca 05/21/2016  . Obesity (BMI 30.0-34.9) 05/21/2016  . Closed TBI (traumatic brain injury) ([facility]) 04/21/2016  . Status post insertion of percutaneous endoscopic gastrostomy (PEG) tube ([facility])  04/21/2016  . Facial laceration 04/21/2016  . Cheek wound, right, subsequent encounter 04/21/2016  . Urinary tract infection due to extended-spectrum beta lactamase (ESBL) producing Escherichia coli 04/21/2016  . Hypotension 04/21/2016  . Gout   . Hypertension     CMP     Component Value Date/Time   NA 145 07/28/2017   K 3.7 07/28/2017   CL 107 07/22/2017 1002   CO2 26 07/22/2017 1002   GLUCOSE 156 (H) 07/22/2017 1002   BUN 12 07/28/2017   CREATININE 0.39 07/28/2017   CALCIUM 8.1 07/28/2017   PROT 5.7 (L) 07/22/2017 1002   ALBUMIN 2.6 (L) 07/22/2017 1002   AST 28 07/22/2017 1002   ALT 36 07/22/2017 1002   ALKPHOS 97 07/22/2017 1002   BILITOT 0.7 07/22/2017 1002   GFRNONAA >90 07/28/2017   GFRAA >60 07/22/2017 1002   Recent Labs    07/19/17 2107 07/20/17 0537 07/22/17 1002 07/28/17  NA 143 141 141 145  K 3.1* 2.9* 4.3 3.7  CL 101 104 107  --   CO2 29 28 26   --   GLUCOSE  92 93 156*  --   BUN 10 7 5* 12  CREATININE 0.58 0.45 0.68 0.39  CALCIUM 8.6* 8.4* 8.5* 8.1   Recent Labs    07/19/17 2107 07/20/17 0537 07/22/17 1002  AST 57* 39 28  ALT 80* 63* 36  ALKPHOS 134* 115 97  BILITOT 1.2 1.0 0.7  PROT 6.4* 5.8* 5.7*  ALBUMIN 3.0* 2.5* 2.6*   Recent Labs    12/25/16 1800  07/19/17 2107 07/20/17 0537 07/22/17 1002 07/28/17  WBC 10.6*   < > 10.7* 7.8 20.0* 8.7  NEUTROABS 8.7*  --  8.2*  --   --   --   HGB 11.5*   < > 12.9 12.0 12.3 11.3  HCT 35.4*   < > 41.9 38.4 39.7 35.0  MCV 89.6   < > 89.7 88.9 90.6 85.1  PLT 172   < > 181 192 220  --    < > = values in this interval not displayed.   No results for input(s): CHOL, LDLCALC, TRIG in the last 8760 hours.  Invalid input(s): HCL No results found for: MICROALBUR Lab Results  Component Value Date   TSH 1.68 08/06/2013   No results found for: HGBA1C No results found for: CHOL, HDL, LDLCALC, LDLDIRECT, TRIG, CHOLHDL  Significant Diagnostic Results in last 30 days:  No results found.  Assessment  a  Labs/tests ordered:    Autumn Delaine. [name], MD

## 2017-10-18 ENCOUNTER — Emergency Department (HOSPITAL_COMMUNITY): Payer: Medicare Other

## 2017-10-18 ENCOUNTER — Other Ambulatory Visit: Payer: Self-pay

## 2017-10-18 ENCOUNTER — Encounter (HOSPITAL_COMMUNITY): Payer: Self-pay

## 2017-10-18 ENCOUNTER — Emergency Department (HOSPITAL_COMMUNITY)
Admission: EM | Admit: 2017-10-18 | Discharge: 2017-10-18 | Disposition: A | Discharge status: Skilled Nursing Facility | Payer: Medicare Other | Attending: Emergency Medicine | Admitting: Emergency Medicine

## 2017-10-18 DIAGNOSIS — Z79899 Other long term (current) drug therapy: Secondary | ICD-10-CM | POA: Diagnosis not present

## 2017-10-18 DIAGNOSIS — W1789XA Other fall from one level to another, initial encounter: Secondary | ICD-10-CM | POA: Insufficient documentation

## 2017-10-18 DIAGNOSIS — S8991XA Unspecified injury of right lower leg, initial encounter: Secondary | ICD-10-CM | POA: Diagnosis not present

## 2017-10-18 DIAGNOSIS — R0902 Hypoxemia: Secondary | ICD-10-CM | POA: Diagnosis not present

## 2017-10-18 DIAGNOSIS — S72141A Displaced intertrochanteric fracture of right femur, initial encounter for closed fracture: Secondary | ICD-10-CM | POA: Diagnosis not present

## 2017-10-18 DIAGNOSIS — F0390 Unspecified dementia without behavioral disturbance: Secondary | ICD-10-CM | POA: Diagnosis not present

## 2017-10-18 DIAGNOSIS — S0083XA Contusion of other part of head, initial encounter: Secondary | ICD-10-CM | POA: Insufficient documentation

## 2017-10-18 DIAGNOSIS — R52 Pain, unspecified: Secondary | ICD-10-CM | POA: Diagnosis not present

## 2017-10-18 DIAGNOSIS — Y9389 Activity, other specified: Secondary | ICD-10-CM | POA: Diagnosis not present

## 2017-10-18 DIAGNOSIS — Y92129 Unspecified place in nursing home as the place of occurrence of the external cause: Secondary | ICD-10-CM | POA: Insufficient documentation

## 2017-10-18 DIAGNOSIS — S72042A Displaced fracture of base of neck of left femur, initial encounter for closed fracture: Secondary | ICD-10-CM | POA: Diagnosis not present

## 2017-10-18 DIAGNOSIS — S8011XA Contusion of right lower leg, initial encounter: Secondary | ICD-10-CM | POA: Insufficient documentation

## 2017-10-18 DIAGNOSIS — S7011XA Contusion of right thigh, initial encounter: Secondary | ICD-10-CM | POA: Diagnosis not present

## 2017-10-18 DIAGNOSIS — M25552 Pain in left hip: Secondary | ICD-10-CM | POA: Diagnosis not present

## 2017-10-18 DIAGNOSIS — S8012XA Contusion of left lower leg, initial encounter: Secondary | ICD-10-CM | POA: Diagnosis not present

## 2017-10-18 DIAGNOSIS — T07XXXA Unspecified multiple injuries, initial encounter: Secondary | ICD-10-CM

## 2017-10-18 DIAGNOSIS — Y999 Unspecified external cause status: Secondary | ICD-10-CM | POA: Insufficient documentation

## 2017-10-18 DIAGNOSIS — M79661 Pain in right lower leg: Secondary | ICD-10-CM | POA: Diagnosis not present

## 2017-10-18 DIAGNOSIS — I10 Essential (primary) hypertension: Secondary | ICD-10-CM | POA: Insufficient documentation

## 2017-10-18 DIAGNOSIS — S0990XA Unspecified injury of head, initial encounter: Secondary | ICD-10-CM | POA: Diagnosis not present

## 2017-10-18 DIAGNOSIS — S199XXA Unspecified injury of neck, initial encounter: Secondary | ICD-10-CM | POA: Diagnosis not present

## 2017-10-18 DIAGNOSIS — S79912A Unspecified injury of left hip, initial encounter: Secondary | ICD-10-CM | POA: Diagnosis not present

## 2017-10-18 DIAGNOSIS — W19XXXA Unspecified fall, initial encounter: Secondary | ICD-10-CM

## 2017-10-18 DIAGNOSIS — M2550 Pain in unspecified joint: Secondary | ICD-10-CM | POA: Diagnosis not present

## 2017-10-18 DIAGNOSIS — Z7401 Bed confinement status: Secondary | ICD-10-CM | POA: Diagnosis not present

## 2017-10-18 DIAGNOSIS — M25561 Pain in right knee: Secondary | ICD-10-CM | POA: Diagnosis not present

## 2017-10-18 DIAGNOSIS — R404 Transient alteration of awareness: Secondary | ICD-10-CM | POA: Diagnosis not present

## 2017-10-18 LAB — CBG MONITORING, ED
GLUCOSE-CAPILLARY: 82 mg/dL (ref 70–99)
Glucose-Capillary: 79 mg/dL (ref 70–99)
Glucose-Capillary: 77 mg/dL (ref 70–99)
Glucose-Capillary: 82 mg/dL (ref 70–99)

## 2017-10-18 NOTE — ED Provider Notes (Signed)
[place] DEPT Provider Note   CSN: [id number] Arrival date & time: 10/18/17  0935     History   Chief Complaint Chief Complaint  Patient presents with  . Fall    HPI Autumn Bryant is a 82 y.o. female.  82 year old female with history of dementia here after she was dropped while being transferred on mechanical chair.  Patient fell approximately 3 feet.  Did strike her head but did not have any witnessed loss of consciousness.  And the patient was noted to her occiput.  Complains of pain to her bilateral lower extremities worse on the right side.  Questionable shortening and rotation of the left side.  She denies any back chest or abdominal discomfort.  She is currently at her baseline.     Past Medical History:  Diagnosis Date  . Anasarca 05/21/2016  . Anxiety 11/03/2016  . Closed TBI (traumatic brain injury) ([place]) 04/21/2016  . Dysphagia 07/13/2016  . Gout   . Hypertension   . Obesity (BMI 30.0-34.9) 05/21/2016  . Status post insertion of percutaneous endoscopic gastrostomy (PEG) tube ([place]) 04/21/2016    Patient Active Problem List   Diagnosis Date Noted  . Cholecystitis 07/31/2017  . Cholecystitis, acute with cholelithiasis 07/20/2017  . Pressure injury of skin 07/20/2017  . History of total right hip arthroplasty 01/05/2017  . Acute blood loss as cause of postoperative anemia 01/05/2017  . Escherichia coli urinary tract infection 01/05/2017  . Acute encephalopathy 01/05/2017  . Depression, recurrent ([place]) 01/05/2017  . Polyneuropathy 01/05/2017  . Closed right hip fracture ([place]) 12/25/2016  . Anxiety 11/03/2016  . Dysphagia 07/13/2016  . Anasarca 05/21/2016  . Obesity (BMI 30.0-34.9) 05/21/2016  . Closed TBI (traumatic brain injury) ([place]) 04/21/2016  . Status post insertion of percutaneous endoscopic gastrostomy (PEG) tube ([place]) 04/21/2016  . Facial laceration 04/21/2016  . Cheek wound, right, subsequent encounter 04/21/2016  .  Urinary tract infection due to extended-spectrum beta lactamase (ESBL) producing Escherichia coli 04/21/2016  . Hypotension 04/21/2016  . Gout   . Hypertension     Past Surgical History:  Procedure Laterality Date  . CHOLECYSTECTOMY N/A 07/21/2017   Procedure: LAPAROSCOPIC CHOLECYSTECTOMY;  Surgeon: Erroll Luna, MD;  Location: [facility];  Service: General;  Laterality: N/A;  . INTRAMEDULLARY (IM) NAIL INTERTROCHANTERIC Right 12/27/2016   Procedure: INTRAMEDULLARY (IM) NAIL INTERTROCHANTRIC RIGHT HIP;  Surgeon: Paralee Cancel, MD;  Location: [facility];  Service: Orthopedics;  Laterality: Right;  . MASTECTOMY       OB History   None      Home Medications    Prior to Admission medications   Medication Sig Start Date End Date Taking? Authorizing Provider  ALPRAZolam Duanne Moron) 0.5 MG tablet Take 0.5 mg by mouth 2 (two) times daily.    [provider]  Amino Acids-Protein Hydrolys (FEEDING SUPPLEMENT, PRO-STAT SUGAR FREE 64,) LIQD Take 30 mLs by mouth 2 (two) times daily.    [provider]  bimatoprost (LUMIGAN) 0.01 % SOLN Place 1 drop into both eyes at bedtime. For glaucoma    [provider]  bisacodyl (DULCOLAX) 10 MG suppository Place 10 mg rectally daily as needed (constipation not relieved by MOM).    [provider]  escitalopram (LEXAPRO) 5 MG tablet Take 5 mg by mouth daily.    [provider]  furosemide (LASIX) 20 MG tablet Take 20 mg by mouth daily.    [provider]  gabapentin (NEURONTIN) 600 MG tablet Take 600 mg by mouth  3 (three) times daily.    [provider]  Magnesium Hydroxide (MILK OF MAGNESIA PO) Take 30 mLs by mouth daily as needed (if no BM in 3 days).    [provider]  mirtazapine (REMERON) 7.5 MG tablet Take 7.5 mg by mouth. TAKE 1 TABLET BY MOUTH AT BEDTIME FOR APPETITE STIMULANT    [provider]  MULTIPLE VITAMINS-MINERALS PO Take by mouth. TAKE 1 TABLET BY MOUTH ONCE DAILY TO  PROMOTE WOUND HEALING    [provider]  oxyCODONE-acetaminophen (PERCOCET/ROXICET) 5-325 MG tablet Take 1 tablet by mouth at bedtime.    [provider]  sennosides-docusate sodium (SENOKOT-S) 8.6-50 MG tablet Take 2 tablets by mouth 2 (two) times daily as needed for constipation.    [provider]  Sodium Phosphates (FLEET ENEMA RE) Place 1 each rectally daily as needed (constipation not relieved by bisacodyl suppository).    [provider]  venlafaxine XR (EFFEXOR-XR) 75 MG 24 hr capsule Take 75 mg by mouth. TAKE 1 CAPSULE BY MOUTH TWICE A DAY FOR DEPRESSION AND WEIGHT LOSS (DO NOT CRUSH)    [provider]    Family History Family History  Problem Relation Age of Onset  . Asthma Mother   . Anesthesia problems Neg Hx   . Arthritis Neg Hx   . Cancer Neg Hx   . Cerebral palsy Neg Hx   . Clotting disorder Neg Hx   . Club foot Neg Hx   . Collagen disease Neg Hx   . Deep vein thrombosis Neg Hx   . Gait disorder Neg Hx   . Heart disease Neg Hx   . Gout Neg Hx   . Diabetes Neg Hx   . Hip dysplasia Neg Hx   . Hip fracture Neg Hx   . Hypermobility Neg Hx   . Hypertension Neg Hx   . Osteoporosis Neg Hx   . Other Neg Hx   . Stroke Neg Hx   . Thyroid disease Neg Hx   . Vasculitis Neg Hx   . Spina bifida Neg Hx   . Scoliosis Neg Hx   . Rheumatologic disease Neg Hx   . Pulmonary embolism Neg Hx     Social History Social History   Tobacco Use  . Smoking status: Never Smoker  . Smokeless tobacco: Never Used  Substance Use Topics  . Alcohol use: Not Currently  . Drug use: No     Allergies   Demerol [meperidine]   Review of Systems Review of Systems  Unable to perform ROS: Dementia     Physical Exam Updated Vital Signs BP 125/77 (BP Location: Left Arm)   Pulse 85   Temp (!) 97.5 F (36.4 C) (Oral)   Resp 17   Ht 1.651 m (5\' 5" )   Wt 72 kg   LMP  (LMP Unknown)   SpO2 (S) 95%   BMI 26.41 kg/m   Physical Exam    Constitutional: She is oriented to person, place, and time. She appears well-developed and well-nourished.  Non-toxic appearance. No distress.  HENT:  Head: Normocephalic and atraumatic.  Eyes: Pupils are equal, round, and reactive to light. Conjunctivae, EOM and lids are normal.  Neck: Normal range of motion. Neck supple. No spinous process tenderness and no muscular tenderness present. No tracheal deviation present. No thyroid mass present.    Cardiovascular: Normal rate, regular rhythm and normal heart sounds. Exam reveals no gallop.  No murmur heard. Pulmonary/Chest: Effort normal and breath sounds normal.  No stridor. No respiratory distress. She has no decreased breath sounds. She has no wheezes. She has no rhonchi. She has no rales.  Abdominal: Soft. Normal appearance and bowel sounds are normal. She exhibits no distension. There is no tenderness. There is no rebound and no CVA tenderness.  Musculoskeletal: Normal range of motion. She exhibits no edema or tenderness.       Legs: Patient able to lift left lower extremity however limited by pain.  No shortening or rotation noted.  Patient does have swelling and ecchymosis to the right calf which extends up to the right thigh.  No shortening or rotation of the right lower extremity.  Neurological: She is alert and oriented to person, place, and time. She has normal strength. No cranial nerve deficit or sensory deficit. GCS eye subscore is 4. GCS verbal subscore is 5. GCS motor subscore is 6.  Skin: Skin is warm and dry. No abrasion and no rash noted.  Psychiatric: She has a normal mood and affect. Her speech is normal and behavior is normal.  Nursing note and vitals reviewed.    ED Treatments / Results  Labs (all labs ordered are listed, but only abnormal results are displayed) Labs Reviewed - No data to display  EKG None  Radiology No results found.  Procedures Procedures (including critical care time)  Medications Ordered in  ED Medications - No data to display   Initial Impression / Assessment and Plan / ED Course  I have reviewed the triage vital signs and the nursing notes.  Pertinent labs & imaging results that were available during my care of the patient were reviewed by me and considered in my medical decision making (see chart for details).     Patient is x-rays are reviewed and patient noted to have possible acute left subcapital hip fracture.  Patient's head and neck CT without acute findings.  X-ray of patient's right tib-fib does show hematoma which can be seen on her exam.  Her compartment is soft at the calf.  CT of hip however did not show any acute injury.  This case was discussed with Dr. Erlinda Hong from orthopedics reviewed the films and stated that this injury is old as began to heal.  Patient is resting comfortably at this time.  Will be sent back to the nursing home  Final Clinical Impressions(s) / ED Diagnoses   Final diagnoses:  None    ED Discharge Orders    None       Lacretia Leigh, MD 10/18/17 1450

## 2017-10-18 NOTE — ED Notes (Signed)
ATTEMPTED TO CLEAN PATIENT AFTER BOWEL MOVEMENT PATIENT REFUSED TO BE MOVED,EVEN AFTER IT WAS EXPLAINED SHE HAD A  BOWEL MOVEMENT SHE STILL REFUSED TO BE MOVED OR CLEANED!

## 2017-10-18 NOTE — ED Notes (Signed)
Patient transported to X-ray 

## 2017-10-18 NOTE — Discharge Instructions (Addendum)
This patient had a CT of her left hip which showed an old fracture but no new injuries.  She does have a hematoma at her right calf from her current injury but her x-rays were negative.  Use Tylenol as directed for pain

## 2017-10-18 NOTE — ED Notes (Signed)
RN attempted to clean patient. Patient did not want to be moved.

## 2017-10-18 NOTE — ED Notes (Signed)
Bed: UA04 Expected date:  Expected time:  Means of arrival:  Comments: 82 yo fall from lift to floor, leg injury

## 2017-10-18 NOTE — ED Notes (Signed)
Pt transported to CT ?

## 2017-10-18 NOTE — ED Triage Notes (Signed)
Pt arrived via [facility]. Pt comes from [facility]. Pt is AOx1-2 and is at baseline and uses wheelchair for mobility. Facility called 911 due to patient fall. Pt fell during lift assist using lift assist device called "Geri-Lift" which malfunctioned. Pt has questionable shortening on left leg, however pain is only on right leg above and below knee. Hematoma noted on head occipital in location.

## 2017-10-19 ENCOUNTER — Encounter: Payer: Self-pay | Admitting: Internal Medicine

## 2017-10-19 NOTE — Assessment & Plan Note (Signed)
Controlled; continue Lasix 20 mg daily

## 2017-10-19 NOTE — Assessment & Plan Note (Signed)
Moderate to severe mental compromise; continue supportive care

## 2017-10-19 NOTE — Progress Notes (Addendum)
Location:  [facility] Room Number: 211-D Place of Service:  SNF (31)  Autumn Duos, MD  Patient Care Team: Autumn Duos, [name]  Extended Emergency Contact Information Primary Emergency Contact: [name] Address: [address]          [place], [place] [address] [name] of [place] Phone: [phone] Relation: Son Secondary Emergency Contact: [name],[name]          [place], [place] Relation: Sister    Allergies: Demerol [meperidine]  Chief Complaint  Patient presents with  . Medical Management of Chronic Issues    The patient is seen for a routine [facility] visit    HPI: Patient is 82 y.o. female who is being seen for routine issues of hypertension, status post traumatic brain injury and obesity.  Past Medical History:  Diagnosis Date  . Anasarca 05/21/2016  . Anxiety 11/03/2016  . Closed TBI (traumatic brain injury) ([place]) 04/21/2016  . Dysphagia 07/13/2016  . Gout   . Hypertension   . Obesity (BMI 30.0-34.9) 05/21/2016  . Status post insertion of percutaneous endoscopic gastrostomy (PEG) tube ([place]) 04/21/2016    Past Surgical History:  Procedure Laterality Date  . CHOLECYSTECTOMY N/A 07/21/2017   Procedure: LAPAROSCOPIC CHOLECYSTECTOMY;  Surgeon: Erroll Luna, MD;  Location: [facility];  Service: General;  Laterality: N/A;  . INTRAMEDULLARY (IM) NAIL INTERTROCHANTERIC Right 12/27/2016   Procedure: INTRAMEDULLARY (IM) NAIL INTERTROCHANTRIC RIGHT HIP;  Surgeon: Paralee Cancel, MD;  Location: [facility];  Service: Orthopedics;  Laterality: Right;  . MASTECTOMY      Allergies as of 10/15/2017      Reactions   Demerol [meperidine] Swelling      Medication List        Accurate as of 10/15/17 11:59 PM. Always use your most recent med list.          ALPRAZolam 0.5 MG tablet Commonly known as:  XANAX Take 0.5 mg by mouth 2 (two) times daily.   bimatoprost  0.01 % Soln Commonly known as:  LUMIGAN Place 1 drop into both eyes at bedtime. For glaucoma   bisacodyl 10 MG suppository Commonly known as:  DULCOLAX Place 10 mg rectally daily as needed (constipation not relieved by MOM).   escitalopram 5 MG tablet Commonly known as:  LEXAPRO Take 5 mg by mouth daily.   feeding supplement (PRO-STAT SUGAR FREE 64) Liqd Take 30 mLs by mouth 2 (two) times daily.   FLEET ENEMA RE Place 1 each rectally daily as needed (constipation not relieved by bisacodyl suppository).   furosemide 20 MG tablet Commonly known as:  LASIX Take 20 mg by mouth daily.   gabapentin 600 MG tablet Commonly known as:  NEURONTIN Take 600 mg by mouth 3 (three) times daily.   MILK OF MAGNESIA PO Take 30 mLs by mouth daily as needed (if no BM in 3 days).   mirtazapine 7.5 MG tablet Commonly known as:  REMERON Take 7.5 mg by mouth. TAKE 1 TABLET BY MOUTH AT BEDTIME FOR APPETITE STIMULANT   MULTIPLE VITAMINS-MINERALS PO Take by mouth. TAKE 1 TABLET BY MOUTH ONCE DAILY TO PROMOTE WOUND HEALING   oxyCODONE-acetaminophen 5-325 MG tablet Commonly known as:  PERCOCET/ROXICET Take 1 tablet by mouth every 8 (eight) hours. for pain   sennosides-docusate sodium 8.6-50 MG tablet Commonly known as:  SENOKOT-S Take 2 tablets by mouth 2 (two) times daily as needed for constipation.  venlafaxine XR 75 MG 24 hr capsule Commonly known as:  EFFEXOR-XR Take 75 mg by mouth 2 (two) times daily. FOR DEPRESSION (DO NOT CRUSH)       No orders of the defined types were placed in this encounter.   Immunization History  Administered Date(s) Administered  . Influenza-Unspecified 12/19/2016  . PPD Test 04/29/2016  . Pneumococcal Conjugate-13 08/06/2016  . Pneumococcal Polysaccharide-23 04/15/2016  . Tdap 03/31/2016    Social History   Tobacco Use  . Smoking status: Never Smoker  . Smokeless tobacco: Never Used  Substance Use Topics  . Alcohol use: Not Currently     Review of Systems  DATA OBTAINED: from patient-very limited; nursing-no acute concerns GENERAL:  no fevers, fatigue, appetite changes SKIN: No itching, rash HEENT: No complaint RESPIRATORY: No cough, wheezing, SOB CARDIAC: No chest pain, palpitations, lower extremity edema  GI: No abdominal pain, No N/V/D or constipation, No heartburn or reflux  GU: No dysuria, frequency or urgency, or incontinence  MUSCULOSKELETAL: No unrelieved bone/joint pain NEUROLOGIC: No headache, dizziness  PSYCHIATRIC: No overt anxiety or sadness  Vitals:   10/15/17 1504  BP: 129/78  Pulse: 86  Resp: 16  Temp: 97.8 F (36.6 C)  SpO2: 93%   Body mass index is 26.46 kg/m. Physical Exam  GENERAL APPEARANCE: Alert, moderately conversant, No acute distress  SKIN: No diaphoresis rash HEENT: Unremarkable for scar on cheek RESPIRATORY: Breathing is even, unlabored. Lung sounds are clear   CARDIOVASCULAR: Heart RRR no murmurs, rubs or gallops. No peripheral edema  GASTROINTESTINAL: Abdomen is soft, non-tender, not distended w/ normal bowel sounds.  GENITOURINARY: Bladder non tender, not distended  MUSCULOSKELETAL: No abnormal joints or musculature NEUROLOGIC: Cranial nerves 2-12 grossly intact. Moves all extremities; mental compromise PSYCHIATRIC: Mood and affect some deficit, no behavioral issues  Patient Active Problem List   Diagnosis Date Noted  . Cholecystitis 07/31/2017  . Cholecystitis, acute with cholelithiasis 07/20/2017  . Pressure injury of skin 07/20/2017  . History of total right hip arthroplasty 01/05/2017  . Acute blood loss as cause of postoperative anemia 01/05/2017  . Escherichia coli urinary tract infection 01/05/2017  . Acute encephalopathy 01/05/2017  . Depression, recurrent ([facility]) 01/05/2017  . Polyneuropathy 01/05/2017  . Closed right hip fracture ([facility]) 12/25/2016  . Anxiety 11/03/2016  . Dysphagia 07/13/2016  . Anasarca 05/21/2016  . Obesity (BMI 30.0-34.9) 05/21/2016   . Closed TBI (traumatic brain injury) ([facility]) 04/21/2016  . Status post insertion of percutaneous endoscopic gastrostomy (PEG) tube ([facility]) 04/21/2016  . Facial laceration 04/21/2016  . Cheek wound, right, subsequent encounter 04/21/2016  . Urinary tract infection due to extended-spectrum beta lactamase (ESBL) producing Escherichia coli 04/21/2016  . Hypotension 04/21/2016  . Gout   . Hypertension     CMP     Component Value Date/Time   NA 145 07/28/2017   K 3.7 07/28/2017   CL 107 07/22/2017 1002   CO2 26 07/22/2017 1002   GLUCOSE 156 (H) 07/22/2017 1002   BUN 12 07/28/2017   CREATININE 0.39 07/28/2017   CALCIUM 8.1 07/28/2017   PROT 5.7 (L) 07/22/2017 1002   ALBUMIN 2.6 (L) 07/22/2017 1002   AST 28 07/22/2017 1002   ALT 36 07/22/2017 1002   ALKPHOS 97 07/22/2017 1002   BILITOT 0.7 07/22/2017 1002   GFRNONAA >90 07/28/2017   GFRAA >60 07/22/2017 1002   Recent Labs    07/19/17 2107 07/20/17 0537 07/22/17 1002 07/28/17  NA 143 141 141 145  K 3.1* 2.9* 4.3 3.7  CL 101 104 107  --   CO2 29 28 26   --   GLUCOSE 92 93 156*  --   BUN 10 7 5* 12  CREATININE 0.58 0.45 0.68 0.39  CALCIUM 8.6* 8.4* 8.5* 8.1   Recent Labs    07/19/17 2107 07/20/17 0537 07/22/17 1002  AST 57* 39 28  ALT 80* 63* 36  ALKPHOS 134* 115 97  BILITOT 1.2 1.0 0.7  PROT 6.4* 5.8* 5.7*  ALBUMIN 3.0* 2.5* 2.6*   Recent Labs    12/25/16 1800  07/19/17 2107 07/20/17 0537 07/22/17 1002 07/28/17  WBC 10.6*   < > 10.7* 7.8 20.0* 8.7  NEUTROABS 8.7*  --  8.2*  --   --   --   HGB 11.5*   < > 12.9 12.0 12.3 11.3  HCT 35.4*   < > 41.9 38.4 39.7 35.0  MCV 89.6   < > 89.7 88.9 90.6 85.1  PLT 172   < > 181 192 220  --    < > = values in this interval not displayed.   No results for input(s): CHOL, LDLCALC, TRIG in the last 8760 hours.  Invalid input(s): HCL No results found for: MICROALBUR Lab Results  Component Value Date   TSH 1.68 08/06/2013   No results found for: HGBA1C No results found  for: CHOL, HDL, LDLCALC, LDLDIRECT, TRIG, CHOLHDL  Significant Diagnostic Results in last 30 days:  Dg Tibia/fibula Right  Result Date: 10/18/2017 CLINICAL DATA:  Recent fall with right leg pain, initial encounter EXAM: RIGHT TIBIA AND FIBULA - 2 VIEW COMPARISON:  None. FINDINGS: No acute fracture or dislocation is noted. Degenerative changes are noted about the knee and ankle joints. A soft tissue hematoma is noted medially in the calf. IMPRESSION: No acute bony abnormality is noted.  Soft tissue hematoma is seen. Electronically Signed   By: Inez Catalina M.D.   On: 10/18/2017 12:12   Ct Head Wo Contrast  Result Date: 10/18/2017 CLINICAL DATA:  Fall EXAM: CT HEAD WITHOUT CONTRAST CT CERVICAL SPINE WITHOUT CONTRAST TECHNIQUE: Multidetector CT imaging of the head and cervical spine was performed following the standard protocol without intravenous contrast. Multiplanar CT image reconstructions of the cervical spine were also generated. COMPARISON:  12/25/2016 FINDINGS: CT HEAD FINDINGS Brain: No evidence of acute infarction, hemorrhage, hydrocephalus, extra-axial collection or mass lesion/mass effect. Encephalomalacic changes involving the right frontal lobe with associated ex vacuo dilatation of the anterior horn of the right lateral ventricle. Subcortical white matter and periventricular small vessel ischemic changes. Vascular: Intracranial atherosclerosis. Skull: Right frontal burr hole.  No evidence of calvarial fracture. Sinuses/Orbits: Minimal partial opacification of the right ethmoid sinuses. Mastoid air cells are clear. Other: Soft tissue swelling/hematoma overlying the left posterior vertex (series 2/image 29). CT CERVICAL SPINE FINDINGS Alignment: Normal cervical lordosis. Skull base and vertebrae: No acute fracture. No primary bone lesion or focal pathologic process. Soft tissues and spinal canal: No prevertebral fluid or swelling. No visible canal hematoma. Disc levels: Mild degenerative changes  at C5-6. Spinal canal is patent. Upper chest: Visualized lung apices are clear. Other: Visualized thyroid is unremarkable. IMPRESSION: Soft tissue swelling/hematoma overlying the left posterior vertex. No evidence of calvarial fracture. No evidence of acute intracranial abnormality. Encephalomalacic changes involving the right frontal lobe with overlying burr hole. No evidence of traumatic injury to the cervical spine. Mild degenerative changes. Electronically Signed   By: Julian Hy M.D.   On: 10/18/2017 10:55   Ct Cervical Spine Wo Contrast  Result Date: 10/18/2017 CLINICAL DATA:  Fall EXAM: CT HEAD WITHOUT CONTRAST CT CERVICAL SPINE WITHOUT CONTRAST TECHNIQUE: Multidetector CT imaging of the head and cervical spine was performed following the standard protocol without intravenous contrast. Multiplanar CT image reconstructions of the cervical spine were also generated. COMPARISON:  12/25/2016 FINDINGS: CT HEAD FINDINGS Brain: No evidence of acute infarction, hemorrhage, hydrocephalus, extra-axial collection or mass lesion/mass effect. Encephalomalacic changes involving the right frontal lobe with associated ex vacuo dilatation of the anterior horn of the right lateral ventricle. Subcortical white matter and periventricular small vessel ischemic changes. Vascular: Intracranial atherosclerosis. Skull: Right frontal burr hole.  No evidence of calvarial fracture. Sinuses/Orbits: Minimal partial opacification of the right ethmoid sinuses. Mastoid air cells are clear. Other: Soft tissue swelling/hematoma overlying the left posterior vertex (series 2/image 29). CT CERVICAL SPINE FINDINGS Alignment: Normal cervical lordosis. Skull base and vertebrae: No acute fracture. No primary bone lesion or focal pathologic process. Soft tissues and spinal canal: No prevertebral fluid or swelling. No visible canal hematoma. Disc levels: Mild degenerative changes at C5-6. Spinal canal is patent. Upper chest: Visualized lung  apices are clear. Other: Visualized thyroid is unremarkable. IMPRESSION: Soft tissue swelling/hematoma overlying the left posterior vertex. No evidence of calvarial fracture. No evidence of acute intracranial abnormality. Encephalomalacic changes involving the right frontal lobe with overlying burr hole. No evidence of traumatic injury to the cervical spine. Mild degenerative changes. Electronically Signed   By: Julian Hy M.D.   On: 10/18/2017 10:55   Ct Hip Left Wo Contrast  Result Date: 10/18/2017 CLINICAL DATA:  Evaluate left hip fracture. EXAM: CT OF THE LEFT HIP WITHOUT CONTRAST TECHNIQUE: Multidetector CT imaging of the left hip was performed according to the standard protocol. Multiplanar CT image reconstructions were also generated. COMPARISON:  Radiographs, same date.  Prior radiographs 12/25/2016. FINDINGS: There is a displaced subcapital fracture of the left hip. This does not appear acute. The bony margins appear slightly sclerotic and smooth. I really do not see the femoral neck at all. There are several small bone fragments. Findings could be due to aggressive bony resorptive changes. A pathologic fracture would be another possibility. I do not see an obvious lesion on the prior films from 2018. The femoral head is located in the glenoid fossa and I do not see any changes of AVN. The femoral shaft is displaced superiorly and posteriorly. There is a large complex joint effusion which may contain hemorrhage and synovitis. I do not see any other definite bone lesions involving the left hemipelvis. Fairly extensive fatty atrophy of the left pelvic and thigh musculature. The uterus is enlarged. No pelvic adenopathy or inguinal adenopathy. Large amount of stool noted in the rectum suggesting constipation and fecal impaction. Moderate bladder distention. IMPRESSION: 1. Displaced subacute appearing subcapital fracture as detailed above. I really do not see the femoral neck at all. This could be due  to aggressive bony resorptive changes or possible pathologic process. 2. Complex joint effusion may be due to hemorrhage and synovitis. 3. No other definite bony abnormalities involving the left hemipelvis. 4. Fecal impaction in the rectum. 5. Mild uterine enlargement and mild bladder distension. Electronically Signed   By: Marijo Sanes M.D.   On: 10/18/2017 13:43   Dg Knee Complete 4 Views Right  Result Date: 10/18/2017 CLINICAL DATA:  Recent fall with right knee pain, initial encounter EXAM: RIGHT KNEE - COMPLETE 4+ VIEW COMPARISON:  None FINDINGS: Medullary rod is noted in the distal femur. Degenerative changes in the  knee joint are seen in all 3 joint compartments. No joint effusion is seen. No acute fracture or dislocation is noted. No soft tissue abnormality is noted. IMPRESSION: Chronic changes without acute abnormality. Electronically Signed   By: Inez Catalina M.D.   On: 10/18/2017 12:09   Dg Femur Min 2 Views Right  Result Date: 10/18/2017 CLINICAL DATA:  Recent fall with right leg pain, initial encounter EXAM: RIGHT FEMUR 2 VIEWS COMPARISON:  None. FINDINGS: Medullary rod is noted with proximal thick and distal fixation screws. Old healed proximal femoral fracture is seen. No acute fracture or dislocation is noted. No soft tissue changes are seen. IMPRESSION: Prior ORIF of femoral fracture.  No acute abnormality is noted. Electronically Signed   By: Inez Catalina M.D.   On: 10/18/2017 12:11   Dg Hips Bilat With Pelvis 3-4 Views  Result Date: 10/18/2017 CLINICAL DATA:  Recent fall with right leg pain, initial encounter EXAM: DG HIP (WITH OR WITHOUT PELVIS) 3-4V BILAT COMPARISON:  12/25/2016 FINDINGS: Previously seen right proximal femoral fracture has been surgically repaired with medullary rod and proximal screw. No definitive fracture is seen. On the left there is a subcapital femoral neck fracture with impaction at the fracture site. Lucency is noted in the intratrochanteric region. These  changes may be chronic in nature correlation with clinical history is recommended. Pelvic ring appears intact. IMPRESSION: Prior right femoral fracture with fixation. Subcapital left femoral neck fracture is noted with some lucency in the intratrochanteric region. This fracture may be chronic in nature correlation to the clinical history is recommended. The need for cross-sectional imaging can be determined on a clinical basis. Electronically Signed   By: Inez Catalina M.D.   On: 10/18/2017 12:00    Assessment and Plan  Hypertension Controlled; continue Lasix 20 mg daily  Closed TBI (traumatic brain injury) ([place]) Moderate to severe mental compromise; continue supportive care  Obesity (BMI 30.0-34.9) Continue to monitor weight.  Patient's BMI is 26.46 so she is technically overweight, however patient did have gallbladder surgery and had a rapid weight loss but her weight has now stabilized at 159 pounds.  Patient does need protein supplements as she does have protein malnutrition.     Inocencio Homes, MD

## 2017-10-19 NOTE — Assessment & Plan Note (Addendum)
Continue to monitor weight.  Patient's BMI is 26.46 so she is technically overweight, however patient did have gallbladder surgery and had a rapid weight loss but her weight has now stabilized at 159 pounds.  Patient does need protein supplements as she does have protein malnutrition.

## 2017-10-20 ENCOUNTER — Telehealth: Payer: Self-pay

## 2017-10-20 DIAGNOSIS — L89153 Pressure ulcer of sacral region, stage 3: Secondary | ICD-10-CM | POA: Diagnosis not present

## 2017-10-20 NOTE — Telephone Encounter (Signed)
Possible re-admission to facility. This is a patient you were seeing at [facility] . [facility] F/U is needed if patient was re-admitted to facility upon discharge. Hospital discharge from [facility] on 10/18/2017

## 2017-10-21 DIAGNOSIS — S06349S Traumatic hemorrhage of right cerebrum with loss of consciousness of unspecified duration, sequela: Secondary | ICD-10-CM | POA: Diagnosis not present

## 2017-10-21 DIAGNOSIS — Z9181 History of falling: Secondary | ICD-10-CM | POA: Diagnosis not present

## 2017-10-21 DIAGNOSIS — M6281 Muscle weakness (generalized): Secondary | ICD-10-CM | POA: Diagnosis not present

## 2017-10-27 DIAGNOSIS — L89153 Pressure ulcer of sacral region, stage 3: Secondary | ICD-10-CM | POA: Diagnosis not present

## 2017-10-28 DIAGNOSIS — F325 Major depressive disorder, single episode, in full remission: Secondary | ICD-10-CM | POA: Diagnosis not present

## 2017-10-28 DIAGNOSIS — R4589 Other symptoms and signs involving emotional state: Secondary | ICD-10-CM | POA: Diagnosis not present

## 2017-10-28 DIAGNOSIS — R509 Fever, unspecified: Secondary | ICD-10-CM | POA: Diagnosis not present

## 2017-10-28 DIAGNOSIS — F419 Anxiety disorder, unspecified: Secondary | ICD-10-CM | POA: Diagnosis not present

## 2017-10-29 DIAGNOSIS — M79604 Pain in right leg: Secondary | ICD-10-CM | POA: Diagnosis not present

## 2017-11-03 DIAGNOSIS — L89153 Pressure ulcer of sacral region, stage 3: Secondary | ICD-10-CM | POA: Diagnosis not present

## 2017-11-04 NOTE — Progress Notes (Signed)
Opened in error

## 2017-11-10 DIAGNOSIS — L89153 Pressure ulcer of sacral region, stage 3: Secondary | ICD-10-CM | POA: Diagnosis not present

## 2017-11-11 ENCOUNTER — Encounter (HOSPITAL_BASED_OUTPATIENT_CLINIC_OR_DEPARTMENT_OTHER): Payer: Medicare Other | Attending: Internal Medicine

## 2017-11-12 ENCOUNTER — Other Ambulatory Visit: Payer: Self-pay | Admitting: Internal Medicine

## 2017-11-12 ENCOUNTER — Non-Acute Institutional Stay (SKILLED_NURSING_FACILITY): Payer: Medicare Other | Admitting: Internal Medicine

## 2017-11-12 DIAGNOSIS — S069X6D Unspecified intracranial injury with loss of consciousness greater than 24 hours without return to pre-existing conscious level with patient surviving, subsequent encounter: Secondary | ICD-10-CM | POA: Diagnosis not present

## 2017-11-12 DIAGNOSIS — M869 Osteomyelitis, unspecified: Secondary | ICD-10-CM

## 2017-11-12 DIAGNOSIS — I10 Essential (primary) hypertension: Secondary | ICD-10-CM | POA: Diagnosis not present

## 2017-11-12 DIAGNOSIS — M109 Gout, unspecified: Secondary | ICD-10-CM

## 2017-11-17 ENCOUNTER — Encounter: Payer: Self-pay | Admitting: Internal Medicine

## 2017-11-17 DIAGNOSIS — L89153 Pressure ulcer of sacral region, stage 3: Secondary | ICD-10-CM | POA: Diagnosis not present

## 2017-11-17 NOTE — Progress Notes (Signed)
Location:  [facility] Room Number: 211D Place of Service:  SNF [phone])  Autumn Bryant. Autumn Coil, MD  [name], [name]  Extended Emergency Contact Information Primary Emergency Contact: Autumn Bryant Address: [address]          [place], [place] [address] Autumn Bryant of [place] Phone: [phone] Relation: Son Secondary Emergency Contact: Autumn Bryant          [place], [place] Relation: Sister    Allergies: Demerol [meperidine]  Chief Complaint  Patient presents with  . Medical Management of Chronic Issues    Routine Visit  . Health Maintenance    Influenza vacc    HPI: Patient is 82 y.o. female who who is being seen for routine issues of gout, hypertension, and traumatic brain injury.  Past Medical History:  Diagnosis Date  . Anasarca 05/21/2016  . Anxiety 11/03/2016  . Closed TBI (traumatic brain injury) ([facility]) 04/21/2016  . Dysphagia 07/13/2016  . Gout   . Hypertension   . Obesity (BMI 30.0-34.9) 05/21/2016  . Status post insertion of percutaneous endoscopic gastrostomy (PEG) tube ([place]) 04/21/2016    Past Surgical History:  Procedure Laterality Date  . CHOLECYSTECTOMY N/A 07/21/2017   Procedure: LAPAROSCOPIC CHOLECYSTECTOMY;  Surgeon: Erroll Luna, MD;  Location: [facility];  Service: General;  Laterality: N/A;  . INTRAMEDULLARY (IM) NAIL INTERTROCHANTERIC Right 12/27/2016   Procedure: INTRAMEDULLARY (IM) NAIL INTERTROCHANTRIC RIGHT HIP;  Surgeon: Paralee Cancel, MD;  Location: [facility];  Service: Orthopedics;  Laterality: Right;  . MASTECTOMY      Allergies as of 11/12/2017      Reactions   Demerol [meperidine] Swelling      Medication List        Accurate as of 11/12/17 11:59 PM. Always use your most recent med list.          ALPRAZolam 0.5 MG tablet Commonly known as:  XANAX Take 0.5 mg by mouth 2 (two) times daily.   bimatoprost 0.01 %  Soln Commonly known as:  LUMIGAN Place 1 drop into both eyes at bedtime. For glaucoma   bisacodyl 10 MG suppository Commonly known as:  DULCOLAX Place 10 mg rectally daily as needed (constipation not relieved by MOM).   ELDERTONIC PO Take 15 mLs by mouth 3 (three) times daily.   escitalopram 5 MG tablet Commonly known as:  LEXAPRO Take 5 mg by mouth daily.   FLEET ENEMA RE Place 1 each rectally daily as needed (constipation not relieved by bisacodyl suppository).   furosemide 20 MG tablet Commonly known as:  LASIX Take 20 mg by mouth daily.   gabapentin 600 MG tablet Commonly known as:  NEURONTIN Take 600 mg by mouth 3 (three) times daily.   MILK OF MAGNESIA PO Take 30 mLs by mouth daily as needed (if no BM in 3 days).   mirtazapine 7.5 MG tablet Commonly known as:  REMERON Take 7.5 mg by mouth. TAKE 1 TABLET BY MOUTH AT BEDTIME FOR APPETITE STIMULANT   MULTIPLE VITAMINS-MINERALS PO Take by mouth. TAKE 1 TABLET BY MOUTH ONCE DAILY TO PROMOTE WOUND HEALING   oxyCODONE-acetaminophen 5-325 MG tablet Commonly known as:  PERCOCET/ROXICET Take 1 tablet by mouth every 8 (eight) hours. for pain   sennosides-docusate sodium 8.6-50 MG tablet Commonly known as:  SENOKOT-S Take 2 tablets by mouth 2 (two) times daily as needed for constipation.   venlafaxine XR 75 MG 24  hr capsule Commonly known as:  EFFEXOR-XR Take 75 mg by mouth 2 (two) times daily. FOR DEPRESSION (DO NOT CRUSH)       No orders of the defined types were placed in this encounter.   Immunization History  Administered Date(s) Administered  . Influenza-Unspecified 12/19/2016  . PPD Test 04/29/2016  . Pneumococcal Conjugate-13 08/06/2016  . Pneumococcal Polysaccharide-23 04/15/2016  . Tdap 03/31/2016    Social History   Tobacco Use  . Smoking status: Never Smoker  . Smokeless tobacco: Never Used  Substance Use Topics  . Alcohol use: Not Currently    Review of Systems  DATA OBTAINED: from  patient-limited; nursing- only concern is poor appetite GENERAL:  no fevers, fatigue SKIN: No itching, rash HEENT: No complaint RESPIRATORY: No cough, wheezing, SOB CARDIAC: No chest pain, palpitations, lower extremity edema  GI: No abdominal pain, No N/V/D or constipation, No heartburn or reflux  GU: No dysuria, frequency or urgency, or incontinence  MUSCULOSKELETAL: No unrelieved bone/joint pain NEUROLOGIC: No headache, dizziness  PSYCHIATRIC: No overt anxiety or sadness  Vitals:   11/17/17 0930  BP: (!) 155/80  Pulse: 82  Resp: 20  Temp: (!) 96.8 F (36 C)  SpO2: 98%   Body mass index is 26.41 kg/m. Physical Exam  GENERAL APPEARANCE: Alert, conversant, No acute distress  SKIN: Scar on face HEENT: Unremarkable RESPIRATORY: Breathing is even, unlabored. Lung sounds are clear   CARDIOVASCULAR: Heart RRR no murmurs, rubs or gallops. No peripheral edema  GASTROINTESTINAL: Abdomen is soft, non-tender, not distended w/ normal bowel sounds.  GENITOURINARY: Bladder non tender, not distended  MUSCULOSKELETAL: No abnormal joints or musculature NEUROLOGIC: Cranial nerves 2-12 grossly intact. Moves all extremities PSYCHIATRIC: Mood and affect CBI, worsening, no behavioral issues  Patient Active Problem List   Diagnosis Date Noted  . Cholecystitis 07/31/2017  . Cholecystitis, acute with cholelithiasis 07/20/2017  . Pressure injury of skin 07/20/2017  . History of total right hip arthroplasty 01/05/2017  . Acute blood loss as cause of postoperative anemia 01/05/2017  . Escherichia coli urinary tract infection 01/05/2017  . Acute encephalopathy 01/05/2017  . Depression, recurrent ([facility]) 01/05/2017  . Polyneuropathy 01/05/2017  . Closed right hip fracture ([place] [facility]) 12/25/2016  . Anxiety 11/03/2016  . Dysphagia 07/13/2016  . Anasarca 05/21/2016  . Obesity (BMI 30.0-34.9) 05/21/2016  . Closed TBI (traumatic brain injury) ([facility]) 04/21/2016  . Status post insertion of percutaneous  endoscopic gastrostomy (PEG) tube ([facility]) 04/21/2016  . Facial laceration 04/21/2016  . Cheek wound, right, subsequent encounter 04/21/2016  . Urinary tract infection due to extended-spectrum beta lactamase (ESBL) producing Escherichia coli 04/21/2016  . Hypotension 04/21/2016  . Gout   . Hypertension     CMP     Component Value Date/Time   NA 145 07/28/2017   K 3.7 07/28/2017   CL 107 07/22/2017 1002   CO2 26 07/22/2017 1002   GLUCOSE 156 (H) 07/22/2017 1002   BUN 12 07/28/2017   CREATININE 0.39 07/28/2017   CALCIUM 8.1 07/28/2017   PROT 5.7 (L) 07/22/2017 1002   ALBUMIN 2.6 (L) 07/22/2017 1002   AST 28 07/22/2017 1002   ALT 36 07/22/2017 1002   ALKPHOS 97 07/22/2017 1002   BILITOT 0.7 07/22/2017 1002   GFRNONAA >90 07/28/2017   GFRAA >60 07/22/2017 1002   Recent Labs    07/19/17 2107 07/20/17 0537 07/22/17 1002 07/28/17  NA 143 141 141 145  K 3.1* 2.9* 4.3 3.7  CL 101 104 107  --   CO2 29  28 26  --   GLUCOSE 92 93 156*  --   BUN 10 7 5* 12  CREATININE 0.58 0.45 0.68 0.39  CALCIUM 8.6* 8.4* 8.5* 8.1   Recent Labs    07/19/17 2107 07/20/17 0537 07/22/17 1002  AST 57* 39 28  ALT 80* 63* 36  ALKPHOS 134* 115 97  BILITOT 1.2 1.0 0.7  PROT 6.4* 5.8* 5.7*  ALBUMIN 3.0* 2.5* 2.6*   Recent Labs    12/25/16 1800  07/19/17 2107 07/20/17 0537 07/22/17 1002 07/28/17  WBC 10.6*   < > 10.7* 7.8 20.0* 8.7  NEUTROABS 8.7*  --  8.2*  --   --   --   HGB 11.5*   < > 12.9 12.0 12.3 11.3  HCT 35.4*   < > 41.9 38.4 39.7 35.0  MCV 89.6   < > 89.7 88.9 90.6 85.1  PLT 172   < > 181 192 220  --    < > = values in this interval not displayed.   No results for input(s): CHOL, LDLCALC, TRIG in the last 8760 hours.  Invalid input(s): HCL No results found for: MICROALBUR Lab Results  Component Value Date   TSH 1.68 08/06/2013   No results found for: HGBA1C No results found for: CHOL, HDL, LDLCALC, LDLDIRECT, TRIG, CHOLHDL  Significant Diagnostic Results in last 30  days:  No results found.  Assessment and Plan  Gout No reported flares; on no meds; continue to monitor  Hypertension Controlled; continue Lasix 20 mg daily  Closed TBI (traumatic brain injury) ([place]) Moderate to severe mental compromise; patient is now starting to fail some; continue supportive care    Webb Silversmith D. Autumn Coil, MD

## 2017-11-24 ENCOUNTER — Other Ambulatory Visit: Payer: Medicare Other

## 2017-11-24 DIAGNOSIS — L89153 Pressure ulcer of sacral region, stage 3: Secondary | ICD-10-CM | POA: Diagnosis not present

## 2017-11-26 DIAGNOSIS — Z23 Encounter for immunization: Secondary | ICD-10-CM | POA: Diagnosis not present

## 2017-12-01 DIAGNOSIS — L89153 Pressure ulcer of sacral region, stage 3: Secondary | ICD-10-CM | POA: Diagnosis not present

## 2017-12-04 DIAGNOSIS — F419 Anxiety disorder, unspecified: Secondary | ICD-10-CM | POA: Diagnosis not present

## 2017-12-04 DIAGNOSIS — R4589 Other symptoms and signs involving emotional state: Secondary | ICD-10-CM | POA: Diagnosis not present

## 2017-12-04 DIAGNOSIS — F329 Major depressive disorder, single episode, unspecified: Secondary | ICD-10-CM | POA: Diagnosis not present

## 2017-12-05 ENCOUNTER — Encounter: Payer: Self-pay | Admitting: Internal Medicine

## 2017-12-05 NOTE — Assessment & Plan Note (Signed)
No reported flares; on no meds; continue to monitor

## 2017-12-05 NOTE — Assessment & Plan Note (Signed)
Controlled; continue Lasix 20 mg daily

## 2017-12-05 NOTE — Assessment & Plan Note (Signed)
Moderate to severe mental compromise; patient is now starting to fail some; continue supportive care

## 2017-12-08 DIAGNOSIS — L89153 Pressure ulcer of sacral region, stage 3: Secondary | ICD-10-CM | POA: Diagnosis not present

## 2017-12-15 DIAGNOSIS — L89153 Pressure ulcer of sacral region, stage 3: Secondary | ICD-10-CM | POA: Diagnosis not present

## 2017-12-16 ENCOUNTER — Non-Acute Institutional Stay (SKILLED_NURSING_FACILITY): Payer: Medicare Other | Admitting: Internal Medicine

## 2017-12-16 ENCOUNTER — Encounter: Payer: Self-pay | Admitting: Internal Medicine

## 2017-12-16 DIAGNOSIS — F419 Anxiety disorder, unspecified: Secondary | ICD-10-CM

## 2017-12-16 DIAGNOSIS — R601 Generalized edema: Secondary | ICD-10-CM

## 2017-12-16 DIAGNOSIS — R1310 Dysphagia, unspecified: Secondary | ICD-10-CM | POA: Diagnosis not present

## 2017-12-16 NOTE — Progress Notes (Signed)
Location:  [facility] Room Number: 211D Place of Service:  SNF [phone])  Autumn Bryant. Autumn Coil, MD  [name], [name]  Extended Emergency Contact Information Primary Emergency Contact: [name] Address: [address]          [place], [place] [address] [name] of [place] Phone: [phone] Relation: Son Secondary Emergency Contact: [name],[name]          [place], [place] Relation: Sister    Allergies: Demerol [meperidine]  Chief Complaint  Patient presents with  . Medical Management of Chronic Issues    Routine Visit  . Health Maintenance    Influenza vacc.    HPI: Patient is 82 y.o. female who is being seen for routine issues of anxiety, dysphasia, and anasarca.  Past Medical History:  Diagnosis Date  . Anasarca 05/21/2016  . Anxiety 11/03/2016  . Closed TBI (traumatic brain injury) ([facility]) 04/21/2016  . Dysphagia 07/13/2016  . Gout   . Hypertension   . Obesity (BMI 30.0-34.9) 05/21/2016  . Status post insertion of percutaneous endoscopic gastrostomy (PEG) tube ([facility]) 04/21/2016    Past Surgical History:  Procedure Laterality Date  . CHOLECYSTECTOMY N/A 07/21/2017   Procedure: LAPAROSCOPIC CHOLECYSTECTOMY;  Surgeon: Erroll Luna, MD;  Location: [facility];  Service: General;  Laterality: N/A;  . INTRAMEDULLARY (IM) NAIL INTERTROCHANTERIC Right 12/27/2016   Procedure: INTRAMEDULLARY (IM) NAIL INTERTROCHANTRIC RIGHT HIP;  Surgeon: Paralee Cancel, MD;  Location: [facility];  Service: Orthopedics;  Laterality: Right;  . MASTECTOMY      Allergies as of 12/16/2017      Reactions   Demerol [meperidine] Swelling      Medication List        Accurate as of 12/16/17 11:59 PM. Always use your most recent med list.          ALPRAZolam 0.5 MG tablet Commonly known as:  XANAX Take 0.5 mg by mouth 2 (two) times daily.   bimatoprost 0.01 % Soln Commonly  known as:  LUMIGAN Place 1 drop into both eyes at bedtime. For glaucoma   bisacodyl 10 MG suppository Commonly known as:  DULCOLAX Place 10 mg rectally daily as needed (constipation not relieved by MOM).   escitalopram 5 MG tablet Commonly known as:  LEXAPRO Take 5 mg by mouth daily.   FLEET ENEMA RE Place 1 each rectally daily as needed (constipation not relieved by bisacodyl suppository).   furosemide 20 MG tablet Commonly known as:  LASIX Take 20 mg by mouth daily.   gabapentin 600 MG tablet Commonly known as:  NEURONTIN Take 600 mg by mouth 3 (three) times daily.   MILK OF MAGNESIA PO Take 30 mLs by mouth daily as needed (if no BM in 3 days).   mirtazapine 7.5 MG tablet Commonly known as:  REMERON Take 7.5 mg by mouth. TAKE 1 TABLET BY MOUTH AT BEDTIME FOR APPETITE STIMULANT   MULTIPLE VITAMINS-MINERALS PO Take by mouth. TAKE 1 TABLET BY MOUTH ONCE DAILY TO PROMOTE WOUND HEALING   oxyCODONE-acetaminophen 5-325 MG tablet Commonly known as:  PERCOCET/ROXICET Take 1 tablet by mouth every 8 (eight) hours. for pain   sennosides-docusate sodium 8.6-50 MG tablet Commonly known as:  SENOKOT-S Take 2 tablets by mouth 2 (two) times daily as needed for constipation.   venlafaxine XR 75 MG 24 hr capsule Commonly known as:  EFFEXOR-XR Take 75 mg by mouth 2 (two) times daily.  FOR DEPRESSION (DO NOT CRUSH)       No orders of the defined types were placed in this encounter.   Immunization History  Administered Date(s) Administered  . Influenza-Unspecified 12/19/2016  . PPD Test 04/29/2016  . Pneumococcal Conjugate-13 08/06/2016  . Pneumococcal Polysaccharide-23 04/15/2016  . Tdap 03/31/2016    Social History   Tobacco Use  . Smoking status: Never Smoker  . Smokeless tobacco: Never Used  Substance Use Topics  . Alcohol use: Not Currently    Review of Systems  DATA OBTAINED: from nurse GENERAL:  no fevers, fatigue, appetite changes SKIN: No itching,  rash HEENT: No complaint RESPIRATORY: No cough, wheezing, SOB CARDIAC: No chest pain, palpitations, lower extremity edema  GI: No abdominal pain, No N/V/D or constipation, No heartburn or reflux  GU: No dysuria, frequency or urgency, or incontinence  MUSCULOSKELETAL: No unrelieved bone/joint pain NEUROLOGIC: No headache, dizziness  PSYCHIATRIC: No overt anxiety or sadness  Vitals:   12/16/17 1456  BP: 130/60  Pulse: 72  Resp: 18  Temp: (!) 97.1 F (36.2 C)  SpO2: 97%   Body mass index is 25.98 kg/m. Physical Exam  GENERAL APPEARANCE: Alert, minimally conversant, No acute distress  SKIN: Guarding to cheek HEENT: Unremarkable RESPIRATORY: Breathing is even, unlabored. Lung sounds are clear   CARDIOVASCULAR: Heart RRR no murmurs, rubs or gallops. No peripheral edema  GASTROINTESTINAL: Abdomen is soft, non-tender, not distended w/ normal bowel sounds.  GENITOURINARY: Bladder non tender, not distended  MUSCULOSKELETAL: No abnormal joints or musculature NEUROLOGIC: Cranial nerves 2-12 grossly intact. Moves all extremities PSYCHIATRIC: Mood and affect consistent with prior TBI, no behavioral issues  Patient Active Problem List   Diagnosis Date Noted  . Cholecystitis 07/31/2017  . Cholecystitis, acute with cholelithiasis 07/20/2017  . Pressure injury of skin 07/20/2017  . History of total right hip arthroplasty 01/05/2017  . Acute blood loss as cause of postoperative anemia 01/05/2017  . Escherichia coli urinary tract infection 01/05/2017  . Acute encephalopathy 01/05/2017  . Depression, recurrent ([facility]) 01/05/2017  . Polyneuropathy 01/05/2017  . Closed right hip fracture ([facility]) 12/25/2016  . Anxiety 11/03/2016  . Dysphagia 07/13/2016  . Anasarca 05/21/2016  . Obesity (BMI 30.0-34.9) 05/21/2016  . Closed TBI (traumatic brain injury) ([facility]) 04/21/2016  . Status post insertion of percutaneous endoscopic gastrostomy (PEG) tube ([facility]) 04/21/2016  . Facial laceration 04/21/2016  .  Cheek wound, right, subsequent encounter 04/21/2016  . Urinary tract infection due to extended-spectrum beta lactamase (ESBL) producing Escherichia coli 04/21/2016  . Hypotension 04/21/2016  . Gout   . Hypertension     CMP     Component Value Date/Time   NA 145 07/28/2017   K 3.7 07/28/2017   CL 107 07/22/2017 1002   CO2 26 07/22/2017 1002   GLUCOSE 156 (H) 07/22/2017 1002   BUN 12 07/28/2017   CREATININE 0.39 07/28/2017   CALCIUM 8.1 07/28/2017   PROT 5.7 (L) 07/22/2017 1002   ALBUMIN 2.6 (L) 07/22/2017 1002   AST 28 07/22/2017 1002   ALT 36 07/22/2017 1002   ALKPHOS 97 07/22/2017 1002   BILITOT 0.7 07/22/2017 1002   GFRNONAA >90 07/28/2017   GFRAA >60 07/22/2017 1002   Recent Labs    07/19/17 2107 07/20/17 0537 07/22/17 1002 07/28/17  NA 143 141 141 145  K 3.1* 2.9* 4.3 3.7  CL 101 104 107  --   CO2 29 28 26   --   GLUCOSE 92 93 156*  --   BUN 10 7 5*  12  CREATININE 0.58 0.45 0.68 0.39  CALCIUM 8.6* 8.4* 8.5* 8.1   Recent Labs    07/19/17 2107 07/20/17 0537 07/22/17 1002  AST 57* 39 28  ALT 80* 63* 36  ALKPHOS 134* 115 97  BILITOT 1.2 1.0 0.7  PROT 6.4* 5.8* 5.7*  ALBUMIN 3.0* 2.5* 2.6*   Recent Labs    12/25/16 1800  07/19/17 2107 07/20/17 0537 07/22/17 1002 07/28/17  WBC 10.6*   < > 10.7* 7.8 20.0* 8.7  NEUTROABS 8.7*  --  8.2*  --   --   --   HGB 11.5*   < > 12.9 12.0 12.3 11.3  HCT 35.4*   < > 41.9 38.4 39.7 35.0  MCV 89.6   < > 89.7 88.9 90.6 85.1  PLT 172   < > 181 192 220  --    < > = values in this interval not displayed.   No results for input(s): CHOL, LDLCALC, TRIG in the last 8760 hours.  Invalid input(s): HCL No results found for: MICROALBUR Lab Results  Component Value Date   TSH 1.68 08/06/2013   No results found for: HGBA1C No results found for: CHOL, HDL, LDLCALC, LDLDIRECT, TRIG, CHOLHDL  Significant Diagnostic Results in last 30 days:  No results found.  Assessment and Plan  Anxiety Patient seen on a daily basis  and does not appear anxious; therefore controlled; continue Xanax 0.5 mg twice daily  Dysphagia Patient eats her diet without apparent problem, no reported pneumonias; continue supportive care  Anasarca Continues without any significant edema; continue Lasix 20 mg daily    Autumn Bryant D. Autumn Coil, MD

## 2017-12-20 ENCOUNTER — Encounter: Payer: Self-pay | Admitting: Internal Medicine

## 2017-12-20 NOTE — Assessment & Plan Note (Signed)
Patient seen on a daily basis and does not appear anxious; therefore controlled; continue Xanax 0.5 mg twice daily

## 2017-12-20 NOTE — Assessment & Plan Note (Signed)
Patient eats her diet without apparent problem, no reported pneumonias; continue supportive care

## 2017-12-20 NOTE — Assessment & Plan Note (Signed)
Continues without any significant edema; continue Lasix 20 mg daily

## 2017-12-22 DIAGNOSIS — L89153 Pressure ulcer of sacral region, stage 3: Secondary | ICD-10-CM | POA: Diagnosis not present

## 2017-12-29 DIAGNOSIS — L89153 Pressure ulcer of sacral region, stage 3: Secondary | ICD-10-CM | POA: Diagnosis not present

## 2017-12-31 DIAGNOSIS — F419 Anxiety disorder, unspecified: Secondary | ICD-10-CM | POA: Diagnosis not present

## 2017-12-31 DIAGNOSIS — F329 Major depressive disorder, single episode, unspecified: Secondary | ICD-10-CM | POA: Diagnosis not present

## 2018-01-05 DIAGNOSIS — L89153 Pressure ulcer of sacral region, stage 3: Secondary | ICD-10-CM | POA: Diagnosis not present

## 2018-01-12 DIAGNOSIS — L89153 Pressure ulcer of sacral region, stage 3: Secondary | ICD-10-CM | POA: Diagnosis not present

## 2018-01-14 ENCOUNTER — Encounter: Payer: Self-pay | Admitting: Internal Medicine

## 2018-01-14 ENCOUNTER — Non-Acute Institutional Stay (SKILLED_NURSING_FACILITY): Payer: Medicare Other | Admitting: Internal Medicine

## 2018-01-14 DIAGNOSIS — F339 Major depressive disorder, recurrent, unspecified: Secondary | ICD-10-CM

## 2018-01-14 DIAGNOSIS — G629 Polyneuropathy, unspecified: Secondary | ICD-10-CM

## 2018-01-14 DIAGNOSIS — M109 Gout, unspecified: Secondary | ICD-10-CM

## 2018-01-14 NOTE — Progress Notes (Signed)
Location:  [facility] Room Number: 211D Place of Service:  [phone])  [name], MD  [name], [name]  Extended Emergency Contact Information Primary Emergency Contact: [name] Address: [address]          [place], [place] [address] [name] of [place] Phone: [phone] Relation: Son Secondary Emergency Contact: [name],[name]          [place], [place] Relation: Sister    Allergies: Demerol [meperidine]  Chief Complaint  Patient presents with  . Medical Management of Chronic Issues    Routine Visit    HPI: Patient is 82 y.o. female who is being seen for routine issues of acute: Of polyneuropathy, gout, and depression.  Past Medical History:  Diagnosis Date  . Anasarca 05/21/2016  . Anxiety 11/03/2016  . Closed TBI (traumatic brain injury) ([employer]) 04/21/2016  . Dysphagia 07/13/2016  . Gout   . Hypertension   . Obesity (BMI 30.0-34.9) 05/21/2016  . Status post insertion of percutaneous endoscopic gastrostomy (PEG) tube ([facility]) 04/21/2016    Past Surgical History:  Procedure Laterality Date  . CHOLECYSTECTOMY N/A 07/21/2017   Procedure: LAPAROSCOPIC CHOLECYSTECTOMY;  Surgeon: Erroll Luna, MD;  Location: [facility];  Service: General;  Laterality: N/A;  . INTRAMEDULLARY (IM) NAIL INTERTROCHANTERIC Right 12/27/2016   Procedure: INTRAMEDULLARY (IM) NAIL INTERTROCHANTRIC RIGHT HIP;  Surgeon: Paralee Cancel, MD;  Location: [facility];  Service: Orthopedics;  Laterality: Right;  . MASTECTOMY      Allergies as of 01/14/2018      Reactions   Demerol [meperidine] Swelling      Medication List       Accurate as of January 14, 2018 11:59 PM. Always use your most recent med list.        ALPRAZolam 0.5 MG tablet Commonly known as:  XANAX Take 0.5 mg by mouth 2 (two) times daily.   bimatoprost 0.01 % Soln Commonly known as:   LUMIGAN Place 1 drop into both eyes at bedtime. For glaucoma   bisacodyl 10 MG suppository Commonly known as:  DULCOLAX Place 10 mg rectally daily as needed (constipation not relieved by MOM).   escitalopram 5 MG tablet Commonly known as:  LEXAPRO Take 5 mg by mouth daily.   FLEET ENEMA RE Place 1 each rectally daily as needed (constipation not relieved by bisacodyl suppository).   furosemide 20 MG tablet Commonly known as:  LASIX Take 20 mg by mouth daily.   gabapentin 600 MG tablet Commonly known as:  NEURONTIN Take 600 mg by mouth 3 (three) times daily.   MILK OF MAGNESIA PO Take 30 mLs by mouth daily as needed (if no BM in 3 days).   mirtazapine 7.5 MG tablet Commonly known as:  REMERON Take 7.5 mg by mouth. TAKE 1 TABLET BY MOUTH AT BEDTIME FOR APPETITE STIMULANT   MULTIPLE VITAMINS-MINERALS PO Take by mouth. TAKE 1 TABLET BY MOUTH ONCE DAILY TO PROMOTE WOUND HEALING   oxyCODONE-acetaminophen 5-325 MG tablet Commonly known as:  PERCOCET/ROXICET Take 1 tablet by mouth every 8 (eight) hours. for pain   sennosides-docusate sodium 8.6-50 MG tablet Commonly known as:  SENOKOT-S Take 2 tablets by mouth 2 (two) times daily as needed for constipation.   venlafaxine XR 75 MG 24 hr capsule Commonly known as:  EFFEXOR-XR Take 75 mg by mouth 2 (two) times daily. FOR DEPRESSION (DO NOT CRUSH)  No orders of the defined types were placed in this encounter.   Immunization History  Administered Date(s) Administered  . Influenza-Unspecified 12/19/2016  . PPD Test 04/29/2016  . Pneumococcal Conjugate-13 08/06/2016  . Pneumococcal Polysaccharide-23 04/15/2016  . Tdap 03/31/2016    Social History   Tobacco Use  . Smoking status: Never Smoker  . Smokeless tobacco: Never Used  Substance Use Topics  . Alcohol use: Not Currently    Review of Systems  DATA OBTAINED: from patient-limited; nursing- poor appetite GENERAL:  no fevers, fatigue, +appetite  changes SKIN: No itching, rash HEENT: No complaint RESPIRATORY: No cough, wheezing, SOB CARDIAC: No chest pain, palpitations, lower extremity edema  GI: No abdominal pain, No N/V/D or constipation, No heartburn or reflux  GU: No dysuria, frequency or urgency, or incontinence  MUSCULOSKELETAL: No unrelieved bone/joint pain NEUROLOGIC: No headache, dizziness  PSYCHIATRIC: No overt anxiety or sadness  Vitals:   01/14/18 1602  BP: 114/70  Pulse: 72  Resp: 18  Temp: 98.4 F (36.9 C)   Body mass index is 25.86 kg/m. Physical Exam  GENERAL APPEARANCE: Alert, conversant, No acute distress  SKIN: No diaphoresis rash HEENT: Scar on cheek RESPIRATORY: Breathing is even, unlabored. Lung sounds are clear   CARDIOVASCULAR: Heart RRR no murmurs, rubs or gallops. No peripheral edema  GASTROINTESTINAL: Abdomen is soft, non-tender, not distended w/ normal bowel sounds.  GENITOURINARY: Bladder non tender, not distended  MUSCULOSKELETAL: No abnormal joints or musculature NEUROLOGIC: Cranial nerves 2-12 grossly intact. Moves all extremities PSYCHIATRIC: Mood and affect with brain injury, worsening, no behavioral issues  Patient Active Problem List   Diagnosis Date Noted  . Cholecystitis 07/31/2017  . Cholecystitis, acute with cholelithiasis 07/20/2017  . Pressure injury of skin 07/20/2017  . History of total right hip arthroplasty 01/05/2017  . Acute blood loss as cause of postoperative anemia 01/05/2017  . Escherichia coli urinary tract infection 01/05/2017  . Acute encephalopathy 01/05/2017  . Depression, recurrent ([facility]) 01/05/2017  . Polyneuropathy 01/05/2017  . Closed right hip fracture ([facility]) 12/25/2016  . Anxiety 11/03/2016  . Dysphagia 07/13/2016  . Anasarca 05/21/2016  . Obesity (BMI 30.0-34.9) 05/21/2016  . Closed TBI (traumatic brain injury) ([facility]) 04/21/2016  . Status post insertion of percutaneous endoscopic gastrostomy (PEG) tube ([facility]) 04/21/2016  . Facial laceration  04/21/2016  . Cheek wound, right, subsequent encounter 04/21/2016  . Urinary tract infection due to extended-spectrum beta lactamase (ESBL) producing Escherichia coli 04/21/2016  . Hypotension 04/21/2016  . Gout   . Hypertension     CMP     Component Value Date/Time   NA 145 07/28/2017   K 3.7 07/28/2017   CL 107 07/22/2017 1002   CO2 26 07/22/2017 1002   GLUCOSE 156 (H) 07/22/2017 1002   BUN 12 07/28/2017   CREATININE 0.39 07/28/2017   CALCIUM 8.1 07/28/2017   PROT 5.7 (L) 07/22/2017 1002   ALBUMIN 2.6 (L) 07/22/2017 1002   AST 28 07/22/2017 1002   ALT 36 07/22/2017 1002   ALKPHOS 97 07/22/2017 1002   BILITOT 0.7 07/22/2017 1002   GFRNONAA >90 07/28/2017   GFRAA >60 07/22/2017 1002   Recent Labs    07/19/17 2107 07/20/17 0537 07/22/17 1002 07/28/17  NA 143 141 141 145  K 3.1* 2.9* 4.3 3.7  CL 101 104 107  --   CO2 29 28 26   --   GLUCOSE 92 93 156*  --   BUN 10 7 5* 12  CREATININE 0.58 0.45 0.68 0.39  CALCIUM 8.6* 8.4*  8.5* 8.1   Recent Labs    07/19/17 2107 07/20/17 0537 07/22/17 1002  AST 57* 39 28  ALT 80* 63* 36  ALKPHOS 134* 115 97  BILITOT 1.2 1.0 0.7  PROT 6.4* 5.8* 5.7*  ALBUMIN 3.0* 2.5* 2.6*   Recent Labs    07/19/17 2107 07/20/17 0537 07/22/17 1002 07/28/17  WBC 10.7* 7.8 20.0* 8.7  NEUTROABS 8.2*  --   --   --   HGB 12.9 12.0 12.3 11.3  HCT 41.9 38.4 39.7 35.0  MCV 89.7 88.9 90.6 85.1  PLT 181 192 220  --    No results for input(s): CHOL, LDLCALC, TRIG in the last 8760 hours.  Invalid input(s): HCL No results found for: MICROALBUR Lab Results  Component Value Date   TSH 1.68 08/06/2013   No results found for: HGBA1C No results found for: CHOL, HDL, LDLCALC, LDLDIRECT, TRIG, CHOLHDL  Significant Diagnostic Results in last 30 days:  No results found.  Assessment and Plan  Polyneuropathy Appears controlled; continue Neurontin 600 mg 3 times daily  Gout Continues without flares; patient is on no meds; will  monitor  Depression, recurrent (HCC) Appears controlled; continue Effexor XR 75 mg twice daily and Lexapro 5 mg daily     Nicolae Vasek D. [name], MD

## 2018-01-19 DIAGNOSIS — L89153 Pressure ulcer of sacral region, stage 3: Secondary | ICD-10-CM | POA: Diagnosis not present

## 2018-01-24 ENCOUNTER — Encounter: Payer: Self-pay | Admitting: Internal Medicine

## 2018-01-24 NOTE — Assessment & Plan Note (Signed)
Appears controlled; continue Effexor XR 75 mg twice daily and Lexapro 5 mg daily

## 2018-01-24 NOTE — Assessment & Plan Note (Signed)
Appears controlled; continue Neurontin 600 mg 3 times daily

## 2018-01-24 NOTE — Assessment & Plan Note (Signed)
Continues without flares; patient is on no meds; will monitor

## 2018-01-26 DIAGNOSIS — F329 Major depressive disorder, single episode, unspecified: Secondary | ICD-10-CM | POA: Diagnosis not present

## 2018-01-26 DIAGNOSIS — F419 Anxiety disorder, unspecified: Secondary | ICD-10-CM | POA: Diagnosis not present

## 2018-01-26 DIAGNOSIS — R4589 Other symptoms and signs involving emotional state: Secondary | ICD-10-CM | POA: Diagnosis not present

## 2018-01-26 DIAGNOSIS — L89153 Pressure ulcer of sacral region, stage 3: Secondary | ICD-10-CM | POA: Diagnosis not present

## 2018-02-02 DIAGNOSIS — L89153 Pressure ulcer of sacral region, stage 3: Secondary | ICD-10-CM | POA: Diagnosis not present

## 2018-02-02 DIAGNOSIS — B351 Tinea unguium: Secondary | ICD-10-CM | POA: Diagnosis not present

## 2018-02-02 DIAGNOSIS — I739 Peripheral vascular disease, unspecified: Secondary | ICD-10-CM | POA: Diagnosis not present

## 2018-02-02 DIAGNOSIS — L603 Nail dystrophy: Secondary | ICD-10-CM | POA: Diagnosis not present

## 2018-02-09 DIAGNOSIS — L89153 Pressure ulcer of sacral region, stage 3: Secondary | ICD-10-CM | POA: Diagnosis not present

## 2018-02-16 ENCOUNTER — Encounter (HOSPITAL_COMMUNITY): Payer: Self-pay

## 2018-02-16 ENCOUNTER — Emergency Department (HOSPITAL_COMMUNITY): Payer: Medicare Other

## 2018-02-16 ENCOUNTER — Inpatient Hospital Stay (HOSPITAL_COMMUNITY)
Admission: EM | Admit: 2018-02-16 | Discharge: 2018-02-22 | DRG: 871 | Disposition: A | Discharge status: Skilled Nursing Facility | Payer: Medicare Other | Source: Skilled Nursing Facility | Attending: Internal Medicine | Admitting: Internal Medicine

## 2018-02-16 ENCOUNTER — Other Ambulatory Visit: Payer: Self-pay

## 2018-02-16 ENCOUNTER — Non-Acute Institutional Stay (SKILLED_NURSING_FACILITY): Payer: Medicare Other | Admitting: Internal Medicine

## 2018-02-16 ENCOUNTER — Encounter: Payer: Self-pay | Admitting: Internal Medicine

## 2018-02-16 DIAGNOSIS — Z87820 Personal history of traumatic brain injury: Secondary | ICD-10-CM

## 2018-02-16 DIAGNOSIS — B9629 Other Escherichia coli [E. coli] as the cause of diseases classified elsewhere: Secondary | ICD-10-CM | POA: Diagnosis present

## 2018-02-16 DIAGNOSIS — I110 Hypertensive heart disease with heart failure: Secondary | ICD-10-CM | POA: Diagnosis present

## 2018-02-16 DIAGNOSIS — J9601 Acute respiratory failure with hypoxia: Secondary | ICD-10-CM

## 2018-02-16 DIAGNOSIS — Z1612 Extended spectrum beta lactamase (ESBL) resistance: Secondary | ICD-10-CM | POA: Diagnosis present

## 2018-02-16 DIAGNOSIS — R079 Chest pain, unspecified: Secondary | ICD-10-CM

## 2018-02-16 DIAGNOSIS — K8050 Calculus of bile duct without cholangitis or cholecystitis without obstruction: Secondary | ICD-10-CM | POA: Diagnosis not present

## 2018-02-16 DIAGNOSIS — R109 Unspecified abdominal pain: Secondary | ICD-10-CM | POA: Diagnosis not present

## 2018-02-16 DIAGNOSIS — R7989 Other specified abnormal findings of blood chemistry: Secondary | ICD-10-CM

## 2018-02-16 DIAGNOSIS — F329 Major depressive disorder, single episode, unspecified: Secondary | ICD-10-CM | POA: Diagnosis present

## 2018-02-16 DIAGNOSIS — K8031 Calculus of bile duct with cholangitis, unspecified, with obstruction: Secondary | ICD-10-CM | POA: Diagnosis not present

## 2018-02-16 DIAGNOSIS — Z79899 Other long term (current) drug therapy: Secondary | ICD-10-CM

## 2018-02-16 DIAGNOSIS — A4151 Sepsis due to Escherichia coli [E. coli]: Secondary | ICD-10-CM | POA: Diagnosis not present

## 2018-02-16 DIAGNOSIS — I9589 Other hypotension: Secondary | ICD-10-CM | POA: Diagnosis not present

## 2018-02-16 DIAGNOSIS — R4182 Altered mental status, unspecified: Secondary | ICD-10-CM | POA: Diagnosis not present

## 2018-02-16 DIAGNOSIS — A4102 Sepsis due to Methicillin resistant Staphylococcus aureus: Secondary | ICD-10-CM | POA: Diagnosis present

## 2018-02-16 DIAGNOSIS — I5032 Chronic diastolic (congestive) heart failure: Secondary | ICD-10-CM | POA: Diagnosis present

## 2018-02-16 DIAGNOSIS — R945 Abnormal results of liver function studies: Secondary | ICD-10-CM

## 2018-02-16 DIAGNOSIS — E876 Hypokalemia: Secondary | ICD-10-CM | POA: Diagnosis present

## 2018-02-16 DIAGNOSIS — Z6827 Body mass index (BMI) 27.0-27.9, adult: Secondary | ICD-10-CM

## 2018-02-16 DIAGNOSIS — L89153 Pressure ulcer of sacral region, stage 3: Secondary | ICD-10-CM | POA: Diagnosis not present

## 2018-02-16 DIAGNOSIS — R1013 Epigastric pain: Secondary | ICD-10-CM

## 2018-02-16 DIAGNOSIS — N390 Urinary tract infection, site not specified: Secondary | ICD-10-CM | POA: Diagnosis not present

## 2018-02-16 DIAGNOSIS — R404 Transient alteration of awareness: Secondary | ICD-10-CM | POA: Diagnosis not present

## 2018-02-16 DIAGNOSIS — G629 Polyneuropathy, unspecified: Secondary | ICD-10-CM

## 2018-02-16 DIAGNOSIS — M109 Gout, unspecified: Secondary | ICD-10-CM | POA: Diagnosis present

## 2018-02-16 DIAGNOSIS — Z825 Family history of asthma and other chronic lower respiratory diseases: Secondary | ICD-10-CM

## 2018-02-16 DIAGNOSIS — Z452 Encounter for adjustment and management of vascular access device: Secondary | ICD-10-CM

## 2018-02-16 DIAGNOSIS — R000 Tachycardia, unspecified: Secondary | ICD-10-CM | POA: Diagnosis not present

## 2018-02-16 DIAGNOSIS — G9341 Metabolic encephalopathy: Secondary | ICD-10-CM | POA: Diagnosis not present

## 2018-02-16 DIAGNOSIS — Z888 Allergy status to other drugs, medicaments and biological substances status: Secondary | ICD-10-CM

## 2018-02-16 DIAGNOSIS — R0689 Other abnormalities of breathing: Secondary | ICD-10-CM | POA: Diagnosis not present

## 2018-02-16 DIAGNOSIS — A419 Sepsis, unspecified organism: Secondary | ICD-10-CM

## 2018-02-16 DIAGNOSIS — Z9049 Acquired absence of other specified parts of digestive tract: Secondary | ICD-10-CM

## 2018-02-16 DIAGNOSIS — Z79891 Long term (current) use of opiate analgesic: Secondary | ICD-10-CM

## 2018-02-16 DIAGNOSIS — L89100 Pressure ulcer of unspecified part of back, unstageable: Secondary | ICD-10-CM | POA: Diagnosis present

## 2018-02-16 DIAGNOSIS — E669 Obesity, unspecified: Secondary | ICD-10-CM | POA: Diagnosis present

## 2018-02-16 DIAGNOSIS — R509 Fever, unspecified: Secondary | ICD-10-CM | POA: Diagnosis not present

## 2018-02-16 DIAGNOSIS — I959 Hypotension, unspecified: Secondary | ICD-10-CM | POA: Diagnosis not present

## 2018-02-16 DIAGNOSIS — F419 Anxiety disorder, unspecified: Secondary | ICD-10-CM | POA: Diagnosis present

## 2018-02-16 LAB — COMPREHENSIVE METABOLIC PANEL
ALT: 81 U/L — ABNORMAL HIGH (ref 0–44)
AST: 168 U/L — ABNORMAL HIGH (ref 15–41)
Albumin: 2.8 g/dL — ABNORMAL LOW (ref 3.5–5.0)
Alkaline Phosphatase: 533 U/L — ABNORMAL HIGH (ref 38–126)
Anion gap: 13 (ref 5–15)
BUN: 20 mg/dL (ref 8–23)
CO2: 32 mmol/L (ref 22–32)
Calcium: 8.4 mg/dL — ABNORMAL LOW (ref 8.9–10.3)
Chloride: 103 mmol/L (ref 98–111)
Creatinine, Ser: 1.01 mg/dL — ABNORMAL HIGH (ref 0.44–1.00)
GFR calc Af Amer: >60 mL/min (ref >60)
GFR calc non Af Amer: 53 mL/min — ABNORMAL LOW (ref >60)
Glucose, Bld: 131 mg/dL — ABNORMAL HIGH (ref 70–99)
Potassium: 2.8 mmol/L — ABNORMAL LOW (ref 3.5–5.1)
Sodium: 148 mmol/L — ABNORMAL HIGH (ref 135–145)
Total Bilirubin: 3.4 mg/dL — ABNORMAL HIGH (ref 0.3–1.2)
Total Protein: 7 g/dL (ref 6.5–8.1)

## 2018-02-16 LAB — CBC WITH DIFFERENTIAL/PLATELET
Abs Immature Granulocytes: 0.09 10*3/uL — ABNORMAL HIGH (ref 0.00–0.07)
Basophils Absolute: 0 10*3/uL (ref 0.0–0.1)
Basophils Relative: 0 %
Eosinophils Absolute: 0 10*3/uL (ref 0.0–0.5)
Eosinophils Relative: 0 %
HCT: 42.8 % (ref 36.0–46.0)
Hemoglobin: 12.5 g/dL (ref 12.0–15.0)
Immature Granulocytes: 1 %
Lymphocytes Relative: 3 %
Lymphs Abs: 0.5 10*3/uL — ABNORMAL LOW (ref 0.7–4.0)
MCH: 26.2 pg (ref 26.0–34.0)
MCHC: 29.2 g/dL — ABNORMAL LOW (ref 30.0–36.0)
MCV: 89.5 fL (ref 80.0–100.0)
Monocytes Absolute: 0.6 10*3/uL (ref 0.1–1.0)
Monocytes Relative: 3 %
Neutro Abs: 16.8 10*3/uL — ABNORMAL HIGH (ref 1.7–7.7)
Neutrophils Relative %: 93 %
Platelets: 265 10*3/uL (ref 150–400)
RBC: 4.78 MIL/uL (ref 3.87–5.11)
RDW: 16.3 % — ABNORMAL HIGH (ref 11.5–15.5)
WBC: 18.1 10*3/uL — ABNORMAL HIGH (ref 4.0–10.5)
nRBC: 0 % (ref 0.0–0.2)

## 2018-02-16 LAB — I-STAT CG4 LACTIC ACID, ED
Lactic Acid, Venous: 2.94 mmol/L (ref 0.5–1.9)
Lactic Acid, Venous: 3.24 mmol/L (ref 0.5–1.9)

## 2018-02-16 LAB — URINALYSIS, ROUTINE W REFLEX MICROSCOPIC
Glucose, UA: NEGATIVE mg/dL
Ketones, ur: NEGATIVE mg/dL
Nitrite: NEGATIVE
Protein, ur: 30 mg/dL — AB
Specific Gravity, Urine: 1.016 (ref 1.005–1.030)
WBC, UA: >50 WBC/hpf — ABNORMAL HIGH (ref 0–5)
pH: 5 (ref 5.0–8.0)

## 2018-02-16 LAB — MAGNESIUM: Magnesium: 2.1 mg/dL (ref 1.7–2.4)

## 2018-02-16 MED ORDER — SODIUM CHLORIDE 0.9 % IV SOLN
2.0000 g | Freq: Once | INTRAVENOUS | Status: AC
  Administered 2018-02-16: 2 g via INTRAVENOUS
  Filled 2018-02-16: qty 2

## 2018-02-16 MED ORDER — VANCOMYCIN HCL IN DEXTROSE 1-5 GM/200ML-% IV SOLN
1000.0000 mg | Freq: Once | INTRAVENOUS | Status: AC
  Administered 2018-02-16: 1000 mg via INTRAVENOUS
  Filled 2018-02-16: qty 200

## 2018-02-16 MED ORDER — METRONIDAZOLE IN NACL 5-0.79 MG/ML-% IV SOLN
500.0000 mg | Freq: Three times a day (TID) | INTRAVENOUS | Status: DC
  Administered 2018-02-16: 500 mg via INTRAVENOUS
  Filled 2018-02-16: qty 100

## 2018-02-16 MED ORDER — SODIUM CHLORIDE 0.9 % IV SOLN
INTRAVENOUS | Status: DC
  Administered 2018-02-16: 23:00:00 via INTRAVENOUS

## 2018-02-16 MED ORDER — LACTATED RINGERS IV BOLUS
30.0000 mL/kg | Freq: Once | INTRAVENOUS | Status: AC
  Administered 2018-02-16: 1000 mL via INTRAVENOUS

## 2018-02-16 MED ORDER — FENTANYL CITRATE (PF) 100 MCG/2ML IJ SOLN
50.0000 ug | Freq: Once | INTRAMUSCULAR | Status: AC
  Administered 2018-02-16: 50 ug via INTRAVENOUS
  Filled 2018-02-16: qty 2

## 2018-02-16 MED ORDER — POTASSIUM CHLORIDE 10 MEQ/100ML IV SOLN
10.0000 meq | INTRAVENOUS | Status: AC
  Administered 2018-02-16 – 2018-02-17 (×3): 10 meq via INTRAVENOUS
  Filled 2018-02-16 (×3): qty 100

## 2018-02-16 MED ORDER — POTASSIUM CHLORIDE CRYS ER 20 MEQ PO TBCR
60.0000 meq | EXTENDED_RELEASE_TABLET | Freq: Once | ORAL | Status: DC
  Filled 2018-02-16: qty 3

## 2018-02-16 MED ORDER — ACETAMINOPHEN 325 MG PO TABS
650.0000 mg | ORAL_TABLET | Freq: Once | ORAL | Status: AC
  Administered 2018-02-16: 650 mg via ORAL
  Filled 2018-02-16: qty 2

## 2018-02-16 NOTE — ED Notes (Signed)
I gave critical I Stat CG4 results to MD [name] Singer

## 2018-02-16 NOTE — ED Notes (Signed)
I gave critical I stat CG4 results to MD [name] Singer

## 2018-02-16 NOTE — ED Provider Notes (Signed)
[facility] DEPT Provider Note   CSN: [id number] Arrival date & time: 02/16/18  1843     History   Chief Complaint Chief Complaint  Patient presents with  . Altered Mental Status    HPI Autumn Bryant is a 83 y.o. female.  HPI   83 year old female sent from her nursing facility for evaluation of change in mental status.  Apparently changes first noted this morning worsening throughout the day.  Noted to be febrile with 101.5.  Patient tells me that she does not feel good.  Does endorse abdominal pain.  She seems confused and is unable to provide much additional history.  Past Medical History:  Diagnosis Date  . Anasarca 05/21/2016  . Anxiety 11/03/2016  . Closed TBI (traumatic brain injury) ([place]) 04/21/2016  . Dysphagia 07/13/2016  . Gout   . Hypertension   . Obesity (BMI 30.0-34.9) 05/21/2016  . Status post insertion of percutaneous endoscopic gastrostomy (PEG) tube ([place]) 04/21/2016    Patient Active Problem List   Diagnosis Date Noted  . Cholecystitis 07/31/2017  . Cholecystitis, acute with cholelithiasis 07/20/2017  . Pressure injury of skin 07/20/2017  . History of total right hip arthroplasty 01/05/2017  . Acute blood loss as cause of postoperative anemia 01/05/2017  . Escherichia coli urinary tract infection 01/05/2017  . Acute encephalopathy 01/05/2017  . Depression, recurrent ([place]) 01/05/2017  . Polyneuropathy 01/05/2017  . Closed right hip fracture ([place]) 12/25/2016  . Anxiety 11/03/2016  . Dysphagia 07/13/2016  . Anasarca 05/21/2016  . Obesity (BMI 30.0-34.9) 05/21/2016  . Closed TBI (traumatic brain injury) ([place]) 04/21/2016  . Status post insertion of percutaneous endoscopic gastrostomy (PEG) tube ([place]) 04/21/2016  . Facial laceration 04/21/2016  . Cheek wound, right, subsequent encounter 04/21/2016  . Urinary tract infection due to extended-spectrum beta lactamase (ESBL) producing Escherichia coli 04/21/2016  . Hypotension  04/21/2016  . Gout   . Hypertension     Past Surgical History:  Procedure Laterality Date  . CHOLECYSTECTOMY N/A 07/21/2017   Procedure: LAPAROSCOPIC CHOLECYSTECTOMY;  Surgeon: Erroll Luna, MD;  Location: [facility];  Service: General;  Laterality: N/A;  . INTRAMEDULLARY (IM) NAIL INTERTROCHANTERIC Right 12/27/2016   Procedure: INTRAMEDULLARY (IM) NAIL INTERTROCHANTRIC RIGHT HIP;  Surgeon: Paralee Cancel, MD;  Location: [facility];  Service: Orthopedics;  Laterality: Right;  . MASTECTOMY       OB History   No obstetric history on file.      Home Medications    Prior to Admission medications   Medication Sig Start Date End Date Taking? Authorizing Provider  ALPRAZolam Duanne Moron) 0.5 MG tablet Take 0.5 mg by mouth 2 (two) times daily.    [provider]  bimatoprost (LUMIGAN) 0.01 % SOLN Place 1 drop into both eyes at bedtime. For glaucoma    [provider]  bisacodyl (DULCOLAX) 10 MG suppository Place 10 mg rectally daily as needed (constipation not relieved by MOM).    [provider]  ENSURE (ENSURE) Take 237 mLs by mouth. TID DUE TO WEIGHT LOSS ( CHOCOLATE IF AVAILABLE )    [provider]  escitalopram (LEXAPRO) 5 MG tablet Take 5 mg by mouth daily.    [provider]  furosemide (LASIX) 20 MG tablet Take 20 mg by mouth daily.    [provider]  gabapentin (NEURONTIN) 600 MG tablet Take 600 mg by mouth 3 (three) times daily.    [provider]  Magnesium Hydroxide (MILK OF MAGNESIA PO) Take 30 mLs by mouth  daily as needed (if no BM in 3 days).    [provider]  mirtazapine (REMERON) 7.5 MG tablet Take 7.5 mg by mouth. TAKE 1 TABLET BY MOUTH AT BEDTIME FOR APPETITE STIMULANT    [provider]  MULTIPLE VITAMINS-MINERALS PO Take by mouth. TAKE 1 TABLET BY MOUTH ONCE DAILY TO PROMOTE WOUND HEALING    [provider]  oxyCODONE-acetaminophen (PERCOCET/ROXICET) 5-325 MG tablet Take 1 tablet by mouth  every 8 (eight) hours. for pain    [provider]  sennosides-docusate sodium (SENOKOT-S) 8.6-50 MG tablet Take 2 tablets by mouth 2 (two) times daily as needed for constipation.    [provider]  Sodium Phosphates (FLEET ENEMA RE) Place 1 each rectally daily as needed (constipation not relieved by bisacodyl suppository).    [provider]  venlafaxine XR (EFFEXOR-XR) 75 MG 24 hr capsule Take 75 mg by mouth 2 (two) times daily. FOR DEPRESSION (DO NOT CRUSH)    [provider]    Family History Family History  Problem Relation Age of Onset  . Asthma Mother   . Anesthesia problems Neg Hx   . Arthritis Neg Hx   . Cancer Neg Hx   . Cerebral palsy Neg Hx   . Clotting disorder Neg Hx   . Club foot Neg Hx   . Collagen disease Neg Hx   . Deep vein thrombosis Neg Hx   . Gait disorder Neg Hx   . Heart disease Neg Hx   . Gout Neg Hx   . Diabetes Neg Hx   . Hip dysplasia Neg Hx   . Hip fracture Neg Hx   . Hypermobility Neg Hx   . Hypertension Neg Hx   . Osteoporosis Neg Hx   . Other Neg Hx   . Stroke Neg Hx   . Thyroid disease Neg Hx   . Vasculitis Neg Hx   . Spina bifida Neg Hx   . Scoliosis Neg Hx   . Rheumatologic disease Neg Hx   . Pulmonary embolism Neg Hx     Social History Social History   Tobacco Use  . Smoking status: Never Smoker  . Smokeless tobacco: Never Used  Substance Use Topics  . Alcohol use: Not Currently  . Drug use: No     Allergies   Demerol [meperidine]   Review of Systems Review of Systems  Level 5 caveat because of confusion.  Physical Exam Updated Vital Signs BP 139/89 (BP Location: Right Arm)   Pulse (!) 113   Temp (!) 101.8 F (38.8 C) (Oral)   Resp (!) 22   LMP  (LMP Unknown)   SpO2 100%   Physical Exam Vitals signs and nursing note reviewed.  Constitutional:      Appearance: She is well-developed. She is ill-appearing.  HENT:     Head: Normocephalic and atraumatic.  Eyes:     General:         Right eye: No discharge.        Left eye: No discharge.     Conjunctiva/sclera: Conjunctivae normal.  Neck:     Musculoskeletal: Neck supple. No neck rigidity.  Cardiovascular:     Rate and Rhythm: Regular rhythm. Tachycardia present.     Heart sounds: Normal heart sounds. No murmur. No friction rub. No gallop.   Pulmonary:     Effort: Pulmonary effort is normal. No respiratory distress.     Breath sounds: Normal breath sounds.  Abdominal:     General: There  is no distension.     Palpations: Abdomen is soft.     Tenderness: There is abdominal tenderness.     Comments: Lower abdominal tenderness without rebound or guarding.  Musculoskeletal:        General: No tenderness.  Skin:    General: Skin is warm and dry.  Neurological:     Comments: Oriented to self.  Follows some simple commands.  No focal deficits noted.      ED Treatments / Results  Labs (all labs ordered are listed, but only abnormal results are displayed) Labs Reviewed  COMPREHENSIVE METABOLIC PANEL - Abnormal; Notable for the following components:      Result Value   Sodium 148 (*)    Potassium 2.8 (*)    Glucose, Bld 131 (*)    Creatinine, Ser 1.01 (*)    Calcium 8.4 (*)    Albumin 2.8 (*)    AST 168 (*)    ALT 81 (*)    Alkaline Phosphatase 533 (*)    Total Bilirubin 3.4 (*)    GFR calc non Af Amer 53 (*)    All other components within normal limits  CBC WITH DIFFERENTIAL/PLATELET - Abnormal; Notable for the following components:   WBC 18.1 (*)    MCHC 29.2 (*)    RDW 16.3 (*)    Neutro Abs 16.8 (*)    Lymphs Abs 0.5 (*)    Abs Immature Granulocytes 0.09 (*)    All other components within normal limits  URINALYSIS, ROUTINE W REFLEX MICROSCOPIC - Abnormal; Notable for the following components:   Color, Urine AMBER (*)    APPearance HAZY (*)    Hgb urine dipstick SMALL (*)    Bilirubin Urine SMALL (*)    Protein, ur 30 (*)    Leukocytes, UA MODERATE (*)    WBC, UA >50 (*)    Bacteria, UA  MANY (*)    All other components within normal limits  I-STAT CG4 LACTIC ACID, ED - Abnormal; Notable for the following components:   Lactic Acid, Venous 2.94 (*)    All other components within normal limits  CULTURE, BLOOD (ROUTINE X 2)  CULTURE, BLOOD (ROUTINE X 2)  I-STAT CG4 LACTIC ACID, ED    EKG None  Radiology Dg Chest Port 1 View  Result Date: 02/16/2018 CLINICAL DATA:  Fever EXAM: PORTABLE CHEST 1 VIEW COMPARISON:  July 19, 2017 FINDINGS: There is mild scarring in the left lower lobe. There is no edema or consolidation. Heart size and pulmonary vascularity are normal. No adenopathy. There is aortic atherosclerosis. Bones are osteoporotic. There are postoperative changes in the right breast region. There is evidence of an old fracture of the right proximal humerus with remodeling in this area. There is chronic anterior dislocation of the right shoulder. IMPRESSION: Scarring left base region. No edema or consolidation. Stable cardiac silhouette. There is aortic atherosclerosis. Bones osteoporotic. Chronic anterior dislocation right shoulder with evidence of old healed fracture right proximal humerus with remodeling. Postoperative change right breast region. Aortic Atherosclerosis (ICD10-I70.0). Electronically Signed   By: Lowella Grip III M.D.   On: 02/16/2018 19:42   Ct Head Wo Contrast  Result Date: 02/17/2018 CLINICAL DATA:  Fever and altered mental status for 1 day. EXAM: CT HEAD WITHOUT CONTRAST TECHNIQUE: Contiguous axial images were obtained from the base of the skull through the vertex without intravenous contrast. COMPARISON:  10/18/2017 FINDINGS: Brain: Patient positioning limits examination. Area of encephalomalacia demonstrated in the right frontal lobe without  change since prior study. Mild diffuse cerebral atrophy. Ventricular dilatation, partially related to encephalomalacia. Low-attenuation changes in the deep white matter likely represent small vessel ischemia. No  change in appearance of intracranial contents since previous study. No acute mass effect or abnormal extra-axial fluid collections. No acute intracranial hemorrhage. Vascular: Intracranial arterial vascular calcifications are present in the vertebrobasilar and carotid arteries. Skull: Focal cranial ostomy in the right anterior frontal region. No acute depressed skull fractures identified. Sinuses/Orbits: Opacification of a posterior right ethmoid air cells. Paranasal sinuses are otherwise clear. Mastoid air cells are clear. No change since prior study. Other: None. IMPRESSION: No acute intracranial abnormalities. Chronic atrophy and small vessel ischemic changes. Area of old encephalomalacia in the right frontal lobe unchanged. Electronically Signed   By: Lucienne Capers M.D.   On: 02/17/2018 03:06   US Abdomen Limited  Result Date: 02/16/2018 CLINICAL DATA:  83 y/o F; abdominal pain and abnormal liver function test. EXAM: ULTRASOUND ABDOMEN LIMITED RIGHT UPPER QUADRANT COMPARISON:  None. FINDINGS: Gallbladder: Not identified. Common bile duct: Diameter: 7 mm Liver: No focal lesion identified. Within normal limits in parenchymal echogenicity. Portal vein is patent on color Doppler imaging with normal direction of blood flow towards the liver. IMPRESSION: Gallbladder not identified. Otherwise unremarkable right upper quadrant ultrasound. Electronically Signed   By: Kristine Garbe M.D.   On: 02/16/2018 22:45   Dg Chest Port 1 View  Result Date: 02/16/2018 CLINICAL DATA:  Fever EXAM: PORTABLE CHEST 1 VIEW COMPARISON:  July 19, 2017 FINDINGS: There is mild scarring in the left lower lobe. There is no edema or consolidation. Heart size and pulmonary vascularity are normal. No adenopathy. There is aortic atherosclerosis. Bones are osteoporotic. There are postoperative changes in the right breast region. There is evidence of an old fracture of the right proximal humerus with remodeling in this area.  There is chronic anterior dislocation of the right shoulder. IMPRESSION: Scarring left base region. No edema or consolidation. Stable cardiac silhouette. There is aortic atherosclerosis. Bones osteoporotic. Chronic anterior dislocation right shoulder with evidence of old healed fracture right proximal humerus with remodeling. Postoperative change right breast region. Aortic Atherosclerosis (ICD10-I70.0). Electronically Signed   By: Lowella Grip III M.D.   On: 02/16/2018 19:42   Dg Ercp Biliary & Pancreatic Ducts  Result Date: 02/18/2018 CLINICAL DATA:  Common bile duct stone. Evaluate patency of the CBD. EXAM: ERCP TECHNIQUE: Multiple spot images obtained with the fluoroscopic device and submitted for interpretation post-procedure. COMPARISON:  CT abdomen pelvis-02/17/2018 FINDINGS: Six spot intraoperative fluoroscopic images of the right upper abdominal quadrant during ERCP are provided for review Initial image demonstrates an ERCP probe overlying the right upper abdominal quadrant. There is suspected cannulation of the common bile duct. Cholecystectomy clips overlies expected location of the gallbladder fossa. Subsequent images demonstrate selective cannulation and opacification of the common bile duct. There are multiple nonocclusive filling defects seen within the CBD worrisome for choledocholithiasis as was suggested on preceding abdominal CT. Subsequent images demonstrate insufflation of a balloon within the CBD with subsequent biliary sweeping and presumed sphincterotomy. IMPRESSION: ERCP with findings suggestive of choledocholithiasis with subsequent presumed biliary sweeping and sphincterotomy. These images were submitted for radiologic interpretation only. Please see the procedural report for the amount of contrast and the fluoroscopy time utilized. Electronically Signed   By: Sandi Mariscal M.D.   On: 02/18/2018 12:05   Ct Renal Stone Study  Result Date: 02/17/2018 CLINICAL DATA:  83 y/o  F;  fever and flank pain.  EXAM: CT ABDOMEN AND PELVIS WITHOUT CONTRAST TECHNIQUE: Multidetector CT imaging of the abdomen and pelvis was performed following the standard protocol without IV contrast. COMPARISON:  None. FINDINGS: Lower chest: Small bilateral pleural effusions. Ill-defined bilateral dependent lower lobe opacities may represent atelectasis or pneumonia. 5 mm nodules in the right middle and lower lobes (series 3, image 2 and 4) and 7 mm nodule in the right middle lobe (series 3, image 11). Hepatobiliary: Diffuse intra biliary ductal dilatation. Multiple biliary stones within the common bile duct (series 4, image 51). Cholecystectomy. Pancreas: Unremarkable. No pancreatic ductal dilatation or surrounding inflammatory changes. Spleen: Normal in size without focal abnormality. Adrenals/Urinary Tract: Normal adrenal glands. Normal right kidney and ureter. 6 mm stone in the proximal left ureter with mild left pelvicaliectasis (series 2, image 32). The downstream ureter is also mildly distended, however, no radiopaque stone is identified. Multiple bladder diverticula. Stomach/Bowel: Stomach is within normal limits. Appendix appears normal. No evidence of bowel wall thickening, distention, or inflammatory changes. Vascular/Lymphatic: Aortic atherosclerosis. No enlarged abdominal or pelvic lymph nodes. Reproductive: Uterus and bilateral adnexa are unremarkable. Other: No abdominal wall hernia or abnormality. No abdominopelvic ascites. Musculoskeletal: Severe T12, mild L3, mild L4 compression deformities. L4-5 grade 1 anterolisthesis. Chronic right proximal femur fracture intramedullary nail and proximal lag screw. 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 IMPRESSION: 1. Intrahepatic biliary ductal dilatation and multiple stones within the common bile duct. 2. Multiple pulmonary nodules measuring up to 7 mm. Non-contrast chest CT at 3-6 months is  recommended. If the nodules are stable at time of repeat CT, then future CT at 18-24 months (from today's scan) is considered optional for low-risk patients, but is recommended for high-risk patients. This recommendation follows the consensus statement: Guidelines for Management of Incidental Pulmonary Nodules Detected on CT Images: From the Fleischner Society 2017; Radiology 2017; 284:228-243. 3. Small bilateral pleural effusions and small dependent lower lobe opacities which may represent associated atelectasis or pneumonia. 4. 6 mm stone in left proximal ureter with mild proximal pelvicaliectasis. 5. Chronic appearing T12, L3, L4 compression deformities. Electronically Signed   By: Kristine Garbe M.D.   On: 02/17/2018 03:11   [place] Ekg Site Rite  Result Date: 02/20/2018 If Site Rite image not attached, placement could not be confirmed due to current cardiac rhythm.   Procedures Procedures (including critical care time)  CRITICAL CARE Performed by: Virgel Manifold Total critical care time: 35 minutes Critical care time was exclusive of separately billable procedures and treating other patients. Critical care was necessary to treat or prevent imminent or life-threatening deterioration. Critical care was time spent personally by me on the following activities: development of treatment plan with patient and/or surrogate as well as nursing, discussions with consultants, evaluation of patient's response to treatment, examination of patient, obtaining history from patient or surrogate, ordering and performing treatments and interventions, ordering and review of laboratory studies, ordering and review of radiographic studies, pulse oximetry and re-evaluation of patient's condition.   Medications Ordered in ED Medications  potassium chloride 10 mEq in 100 mL IVPB (0 mEq Intravenous Stopped 02/18/18 1549)  ceFEPIme (MAXIPIME) 2 g in sodium chloride 0.9 % 100 mL IVPB ( Intravenous Stopped 02/16/18  2017)  vancomycin (VANCOCIN) IVPB 1000 mg/200 mL premix (0 mg Intravenous Stopped 02/16/18 2201)  lactated ringers bolus 2,187 mL (0 mL/kg  72.9 kg Intravenous Stopped 02/16/18 2201)  fentaNYL (SUBLIMAZE) injection 50 mcg (50 mcg Intravenous Given 02/16/18 2047)  acetaminophen (  TYLENOL) tablet 650 mg (650 mg Oral Given 02/16/18 2156)  potassium chloride 10 mEq in 100 mL IVPB (0 mEq Intravenous Stopped 02/17/18 0242)  indomethacin (INDOCIN) 50 MG suppository 100 mg (100 mg Rectal Given 02/17/18 2254)  potassium chloride SA (K-DUR,KLOR-CON) CR tablet 40 mEq (40 mEq Oral Given 02/19/18 0618)  potassium chloride SA (K-DUR,KLOR-CON) CR tablet 40 mEq (40 mEq Oral Given 02/21/18 2209)     Initial Impression / Assessment and Plan / ED Course  I have reviewed the triage vital signs and the nursing notes.  Pertinent labs & imaging results that were available during my care of the patient were reviewed by me and considered in my medical decision making (see chart for details).   82yF with sepsis. She is encephalopathic. Possible UTI. Received empiric abx. Abnormal LFTs. WIll need admitted for ongoing treatment and further w/ork-up.   Final Clinical Impressions(s) / ED Diagnoses   Final diagnoses:  Altered mental status, unspecified altered mental status type  Abdominal pain, unspecified abdominal location  Urinary tract infection without hematuria, site unspecified  Abnormal LFTs    ED Discharge Orders    None       Virgel Manifold, MD 02/23/18 [phone]

## 2018-02-16 NOTE — ED Triage Notes (Signed)
Per EMS,  Pt is presenting from [facility]. Facility called EMS due to finding the pt with altered mental status this morning. Pt is A&O 1x, was found with o2 sat of 66%, normally does not wear oxygen. Also found to have a fever of 101.5

## 2018-02-16 NOTE — ED Notes (Signed)
Bed: RESA Expected date:  Expected time:  Means of arrival:  Comments: EMS AMS 66%

## 2018-02-16 NOTE — Progress Notes (Signed)
Location:  [facility] Room Number: 211D Place of Service:  SNF [phone])  Autumn Bryant. [name], MD  [name], [name]  Extended Emergency Contact Information Primary Emergency Contact: [name] Address: [address]          [place], [place] [address] [name] of [place] Phone: [phone] Relation: Son Secondary Emergency Contact: [name],[name]          [place], [place] Relation: Sister    Allergies: Demerol [meperidine]  Chief Complaint  Patient presents with  . Medical Management of Chronic Issues    Routine Visit    HPI: Patient is 83 y.o. female who the DON asked me to see urgently.  After lunch patient was complaining of chest pain/epigastric pain.  She was taken back to her room where her O2 sats were found to be in the 70s.  When I saw her she had just vomited x1.  Patient is skin was mottled from the neck down arms legs trunk abdomen.  She was awake and could answer questions the entire time.  Patient's abdomen was firm with rare bowel sound, generalized tenderness but more tender in the right upper quadrant epigastric area.  Patient admitted that vomiting did not improve her chest pain/abdominal pain.  Patient's extremities were cool.  Patient was placed on 3 L O2 her O2 saturation went up to 88% so O2 was increased again.  Past Medical History:  Diagnosis Date  . Anasarca 05/21/2016  . Anxiety 11/03/2016  . Closed TBI (traumatic brain injury) ([facility]) 04/21/2016  . Dysphagia 07/13/2016  . Gout   . Hypertension   . Obesity (BMI 30.0-34.9) 05/21/2016  . Status post insertion of percutaneous endoscopic gastrostomy (PEG) tube ([facility]) 04/21/2016    Past Surgical History:  Procedure Laterality Date  . CHOLECYSTECTOMY N/A 07/21/2017   Procedure: LAPAROSCOPIC CHOLECYSTECTOMY;  Surgeon: Erroll Luna, MD;  Location: [facility];  Service: General;  Laterality:  N/A;  . INTRAMEDULLARY (IM) NAIL INTERTROCHANTERIC Right 12/27/2016   Procedure: INTRAMEDULLARY (IM) NAIL INTERTROCHANTRIC RIGHT HIP;  Surgeon: Paralee Cancel, MD;  Location: [facility];  Service: Orthopedics;  Laterality: Right;  . MASTECTOMY      Allergies as of 02/16/2018      Reactions   Demerol [meperidine] Swelling      Medication List       Accurate as of February 16, 2018  2:31 PM. Always use your most recent med list.        ALPRAZolam 0.5 MG tablet Commonly known as:  XANAX Take 0.5 mg by mouth 2 (two) times daily.   bimatoprost 0.01 % Soln Commonly known as:  LUMIGAN Place 1 drop into both eyes at bedtime. For glaucoma   bisacodyl 10 MG suppository Commonly known as:  DULCOLAX Place 10 mg rectally daily as needed (constipation not relieved by MOM).   ENSURE Take 237 mLs by mouth. TID DUE TO WEIGHT LOSS ( CHOCOLATE IF AVAILABLE )   escitalopram 5 MG tablet Commonly known as:  LEXAPRO Take 5 mg by mouth daily.   FLEET ENEMA RE Place 1 each rectally daily as needed (constipation not relieved by bisacodyl suppository).   furosemide 20 MG tablet Commonly known as:  LASIX Take 20 mg by mouth daily.   gabapentin 600 MG tablet Commonly known as:  NEURONTIN Take 600 mg by mouth 3 (three) times daily.   MILK OF MAGNESIA PO Take 30  mLs by mouth daily as needed (if no BM in 3 days).   mirtazapine 7.5 MG tablet Commonly known as:  REMERON Take 7.5 mg by mouth. TAKE 1 TABLET BY MOUTH AT BEDTIME FOR APPETITE STIMULANT   MULTIPLE VITAMINS-MINERALS PO Take by mouth. TAKE 1 TABLET BY MOUTH ONCE DAILY TO PROMOTE WOUND HEALING   oxyCODONE-acetaminophen 5-325 MG tablet Commonly known as:  PERCOCET/ROXICET Take 1 tablet by mouth every 8 (eight) hours. for pain   sennosides-docusate sodium 8.6-50 MG tablet Commonly known as:  SENOKOT-S Take 2 tablets by mouth 2 (two) times daily as needed for constipation.   venlafaxine XR 75 MG 24 hr capsule Commonly known as:   EFFEXOR-XR Take 75 mg by mouth 2 (two) times daily. FOR DEPRESSION (DO NOT CRUSH)       No orders of the defined types were placed in this encounter.   Immunization History  Administered Date(s) Administered  . Influenza-Unspecified 12/19/2016  . PPD Test 04/29/2016  . Pneumococcal Conjugate-13 08/06/2016  . Pneumococcal Polysaccharide-23 04/15/2016  . Tdap 03/31/2016    Social History   Tobacco Use  . Smoking status: Never Smoker  . Smokeless tobacco: Never Used  Substance Use Topics  . Alcohol use: Not Currently    Review of Systems  DATA OBTAINED: from patient, nurse,-both were as per history of present illness; no cough no urinary symptoms, patient is incontinent, no fever.   Vitals:   02/16/18 1427  BP: 121/78  Pulse: 71  Resp: 19  Temp: 98 F (36.7 C)   Body mass index is 26.74 kg/m. Physical Exam  GENERAL APPEARANCE: Alert, speaking and answering questions SKIN: Pale, cool, mottled HEENT: Unremarkable RESPIRATORY: Breathing is even, unlabored. Lung sounds are clear   CARDIOVASCULAR: Heart regular, increased rate, no murmurs, rubs or gallops. No peripheral edema  GASTROINTESTINAL: Abdomen is firm with very rare bowel sounds; mildly diffuse tender more tender in the right upper quadrant and epigastric area.  GENITOURINARY: Bladder non tender, not distended  MUSCULOSKELETAL: No abnormal joints or musculature NEUROLOGIC: Cranial nerves 2-12 grossly intact. Moves all extremities PSYCHIATRIC: Mood and affect that is post TBI and dementia, no behaviors  Patient Active Problem List   Diagnosis Date Noted  . Cholecystitis 07/31/2017  . Cholecystitis, acute with cholelithiasis 07/20/2017  . Pressure injury of skin 07/20/2017  . History of total right hip arthroplasty 01/05/2017  . Acute blood loss as cause of postoperative anemia 01/05/2017  . Escherichia coli urinary tract infection 01/05/2017  . Acute encephalopathy 01/05/2017  . Depression, recurrent  ([facility]) 01/05/2017  . Polyneuropathy 01/05/2017  . Closed right hip fracture ([facility]) 12/25/2016  . Anxiety 11/03/2016  . Dysphagia 07/13/2016  . Anasarca 05/21/2016  . Obesity (BMI 30.0-34.9) 05/21/2016  . Closed TBI (traumatic brain injury) ([facility]) 04/21/2016  . Status post insertion of percutaneous endoscopic gastrostomy (PEG) tube ([facility]) 04/21/2016  . Facial laceration 04/21/2016  . Cheek wound, right, subsequent encounter 04/21/2016  . Urinary tract infection due to extended-spectrum beta lactamase (ESBL) producing Escherichia coli 04/21/2016  . Hypotension 04/21/2016  . Gout   . Hypertension     CMP     Component Value Date/Time   NA 145 07/28/2017   K 3.7 07/28/2017   CL 107 07/22/2017 1002   CO2 26 07/22/2017 1002   GLUCOSE 156 (H) 07/22/2017 1002   BUN 12 07/28/2017   CREATININE 0.39 07/28/2017   CALCIUM 8.1 07/28/2017   PROT 5.7 (L) 07/22/2017 1002   ALBUMIN 2.6 (L) 07/22/2017 1002  AST 28 07/22/2017 1002   ALT 36 07/22/2017 1002   ALKPHOS 97 07/22/2017 1002   BILITOT 0.7 07/22/2017 1002   GFRNONAA >90 07/28/2017   GFRAA >60 07/22/2017 1002   Recent Labs    07/19/17 2107 07/20/17 0537 07/22/17 1002 07/28/17  NA 143 141 141 145  K 3.1* 2.9* 4.3 3.7  CL 101 104 107  --   CO2 29 28 26   --   GLUCOSE 92 93 156*  --   BUN 10 7 5* 12  CREATININE 0.58 0.45 0.68 0.39  CALCIUM 8.6* 8.4* 8.5* 8.1   Recent Labs    07/19/17 2107 07/20/17 0537 07/22/17 1002  AST 57* 39 28  ALT 80* 63* 36  ALKPHOS 134* 115 97  BILITOT 1.2 1.0 0.7  PROT 6.4* 5.8* 5.7*  ALBUMIN 3.0* 2.5* 2.6*   Recent Labs    07/19/17 2107 07/20/17 0537 07/22/17 1002 07/28/17  WBC 10.7* 7.8 20.0* 8.7  NEUTROABS 8.2*  --   --   --   HGB 12.9 12.0 12.3 11.3  HCT 41.9 38.4 39.7 35.0  MCV 89.7 88.9 90.6 85.1  PLT 181 192 220  --    No results for input(s): CHOL, LDLCALC, TRIG in the last 8760 hours.  Invalid input(s): HCL No results found for: MICROALBUR Lab Results  Component Value Date     TSH 1.68 08/06/2013   No results found for: HGBA1C No results found for: CHOL, HDL, LDLCALC, LDLDIRECT, TRIG, CHOLHDL  Significant Diagnostic Results in last 30 days:  No results found.  Assessment and Plan  Hypoxia/hypotension/chest pain/abdominal pain- while waiting for EMS to arrive patient's color and mottled skin improved with O2 but extremities were still cool, patient's mentation was at baseline; has to be sepsis, although she was completely normal 3 hours ago; suspect abdominal etiology; sending patient to the hospital   Time spent greater than 35 minutes Aviel Davalos D. [name], MD

## 2018-02-16 NOTE — ED Notes (Signed)
ED Provider at bedside. EDP KOHUT

## 2018-02-16 NOTE — ED Notes (Signed)
IV 20 R hand placed; blood return noted but not enough for sample.

## 2018-02-16 NOTE — Progress Notes (Signed)
A consult was received from an ED physician for Vancomycin & Cefepime per pharmacy dosing.  The patient's profile has been reviewed for ht/wt/allergies/indication/available labs.   A one time order has been placed for Cefepime 2gm & Vancomycin 1gm IV.  Further antibiotics/pharmacy consults should be ordered by admitting physician if indicated.                       Thank you, Biagio Borg 02/16/2018  7:30 PM

## 2018-02-17 ENCOUNTER — Inpatient Hospital Stay (HOSPITAL_COMMUNITY): Payer: Medicare Other

## 2018-02-17 ENCOUNTER — Encounter (HOSPITAL_COMMUNITY): Payer: Self-pay | Admitting: Internal Medicine

## 2018-02-17 DIAGNOSIS — Z825 Family history of asthma and other chronic lower respiratory diseases: Secondary | ICD-10-CM | POA: Diagnosis not present

## 2018-02-17 DIAGNOSIS — Z9049 Acquired absence of other specified parts of digestive tract: Secondary | ICD-10-CM | POA: Diagnosis not present

## 2018-02-17 DIAGNOSIS — R6520 Severe sepsis without septic shock: Secondary | ICD-10-CM | POA: Diagnosis not present

## 2018-02-17 DIAGNOSIS — I10 Essential (primary) hypertension: Secondary | ICD-10-CM | POA: Diagnosis not present

## 2018-02-17 DIAGNOSIS — A419 Sepsis, unspecified organism: Secondary | ICD-10-CM

## 2018-02-17 DIAGNOSIS — K8033 Calculus of bile duct with acute cholangitis with obstruction: Secondary | ICD-10-CM | POA: Diagnosis not present

## 2018-02-17 DIAGNOSIS — R279 Unspecified lack of coordination: Secondary | ICD-10-CM | POA: Diagnosis not present

## 2018-02-17 DIAGNOSIS — Z7401 Bed confinement status: Secondary | ICD-10-CM | POA: Diagnosis not present

## 2018-02-17 DIAGNOSIS — M8588 Other specified disorders of bone density and structure, other site: Secondary | ICD-10-CM | POA: Diagnosis not present

## 2018-02-17 DIAGNOSIS — Z9181 History of falling: Secondary | ICD-10-CM | POA: Diagnosis not present

## 2018-02-17 DIAGNOSIS — M810 Age-related osteoporosis without current pathological fracture: Secondary | ICD-10-CM | POA: Diagnosis not present

## 2018-02-17 DIAGNOSIS — N390 Urinary tract infection, site not specified: Secondary | ICD-10-CM | POA: Diagnosis present

## 2018-02-17 DIAGNOSIS — G9341 Metabolic encephalopathy: Secondary | ICD-10-CM | POA: Diagnosis present

## 2018-02-17 DIAGNOSIS — F419 Anxiety disorder, unspecified: Secondary | ICD-10-CM | POA: Diagnosis present

## 2018-02-17 DIAGNOSIS — R601 Generalized edema: Secondary | ICD-10-CM | POA: Diagnosis not present

## 2018-02-17 DIAGNOSIS — Z87820 Personal history of traumatic brain injury: Secondary | ICD-10-CM | POA: Diagnosis not present

## 2018-02-17 DIAGNOSIS — K8309 Other cholangitis: Secondary | ICD-10-CM | POA: Diagnosis not present

## 2018-02-17 DIAGNOSIS — F329 Major depressive disorder, single episode, unspecified: Secondary | ICD-10-CM | POA: Diagnosis present

## 2018-02-17 DIAGNOSIS — N132 Hydronephrosis with renal and ureteral calculous obstruction: Secondary | ICD-10-CM | POA: Diagnosis not present

## 2018-02-17 DIAGNOSIS — R5381 Other malaise: Secondary | ICD-10-CM | POA: Diagnosis not present

## 2018-02-17 DIAGNOSIS — K8050 Calculus of bile duct without cholangitis or cholecystitis without obstruction: Secondary | ICD-10-CM | POA: Diagnosis not present

## 2018-02-17 DIAGNOSIS — I509 Heart failure, unspecified: Secondary | ICD-10-CM | POA: Diagnosis not present

## 2018-02-17 DIAGNOSIS — Z48815 Encounter for surgical aftercare following surgery on the digestive system: Secondary | ICD-10-CM | POA: Diagnosis not present

## 2018-02-17 DIAGNOSIS — M109 Gout, unspecified: Secondary | ICD-10-CM | POA: Diagnosis present

## 2018-02-17 DIAGNOSIS — G629 Polyneuropathy, unspecified: Secondary | ICD-10-CM | POA: Diagnosis present

## 2018-02-17 DIAGNOSIS — A4151 Sepsis due to Escherichia coli [E. coli]: Secondary | ICD-10-CM | POA: Diagnosis present

## 2018-02-17 DIAGNOSIS — Z1612 Extended spectrum beta lactamase (ESBL) resistance: Secondary | ICD-10-CM | POA: Diagnosis present

## 2018-02-17 DIAGNOSIS — L89153 Pressure ulcer of sacral region, stage 3: Secondary | ICD-10-CM | POA: Diagnosis present

## 2018-02-17 DIAGNOSIS — Z6827 Body mass index (BMI) 27.0-27.9, adult: Secondary | ICD-10-CM | POA: Diagnosis not present

## 2018-02-17 DIAGNOSIS — I5032 Chronic diastolic (congestive) heart failure: Secondary | ICD-10-CM | POA: Diagnosis present

## 2018-02-17 DIAGNOSIS — I110 Hypertensive heart disease with heart failure: Secondary | ICD-10-CM | POA: Diagnosis present

## 2018-02-17 DIAGNOSIS — R1312 Dysphagia, oropharyngeal phase: Secondary | ICD-10-CM | POA: Diagnosis not present

## 2018-02-17 DIAGNOSIS — I69828 Other speech and language deficits following other cerebrovascular disease: Secondary | ICD-10-CM | POA: Diagnosis not present

## 2018-02-17 DIAGNOSIS — Z888 Allergy status to other drugs, medicaments and biological substances status: Secondary | ICD-10-CM | POA: Diagnosis not present

## 2018-02-17 DIAGNOSIS — M2550 Pain in unspecified joint: Secondary | ICD-10-CM | POA: Diagnosis not present

## 2018-02-17 DIAGNOSIS — R4182 Altered mental status, unspecified: Secondary | ICD-10-CM | POA: Diagnosis not present

## 2018-02-17 DIAGNOSIS — Z79891 Long term (current) use of opiate analgesic: Secondary | ICD-10-CM | POA: Diagnosis not present

## 2018-02-17 DIAGNOSIS — M6281 Muscle weakness (generalized): Secondary | ICD-10-CM | POA: Diagnosis not present

## 2018-02-17 DIAGNOSIS — Z79899 Other long term (current) drug therapy: Secondary | ICD-10-CM | POA: Diagnosis not present

## 2018-02-17 DIAGNOSIS — E669 Obesity, unspecified: Secondary | ICD-10-CM | POA: Diagnosis present

## 2018-02-17 DIAGNOSIS — R41841 Cognitive communication deficit: Secondary | ICD-10-CM | POA: Diagnosis not present

## 2018-02-17 DIAGNOSIS — B9629 Other Escherichia coli [E. coli] as the cause of diseases classified elsewhere: Secondary | ICD-10-CM | POA: Diagnosis not present

## 2018-02-17 DIAGNOSIS — E876 Hypokalemia: Secondary | ICD-10-CM | POA: Diagnosis present

## 2018-02-17 DIAGNOSIS — A4102 Sepsis due to Methicillin resistant Staphylococcus aureus: Secondary | ICD-10-CM | POA: Diagnosis present

## 2018-02-17 DIAGNOSIS — L89100 Pressure ulcer of unspecified part of back, unstageable: Secondary | ICD-10-CM | POA: Diagnosis present

## 2018-02-17 DIAGNOSIS — K8031 Calculus of bile duct with cholangitis, unspecified, with obstruction: Secondary | ICD-10-CM | POA: Diagnosis present

## 2018-02-17 LAB — BLOOD GAS, ARTERIAL
Acid-Base Excess: 4.6 mmol/L — ABNORMAL HIGH (ref 0.0–2.0)
Bicarbonate: 30 mmol/L — ABNORMAL HIGH (ref 20.0–28.0)
Drawn by: 308601
O2 Content: 2 L/min
O2 Saturation: 98.3 %
PCO2 ART: 51.1 mmHg — AB (ref 32.0–48.0)
PO2 ART: 127 mmHg — AB (ref 83.0–108.0)
Patient temperature: 37
pH, Arterial: 7.386 (ref 7.350–7.450)

## 2018-02-17 LAB — CBC WITH DIFFERENTIAL/PLATELET
Abs Immature Granulocytes: 0.17 10*3/uL — ABNORMAL HIGH (ref 0.00–0.07)
BASOS PCT: 0 %
Basophils Absolute: 0 10*3/uL (ref 0.0–0.1)
Eosinophils Absolute: 0 10*3/uL (ref 0.0–0.5)
Eosinophils Relative: 0 %
HCT: 34.9 % — ABNORMAL LOW (ref 36.0–46.0)
Hemoglobin: 10.1 g/dL — ABNORMAL LOW (ref 12.0–15.0)
Immature Granulocytes: 1 %
Lymphocytes Relative: 7 %
Lymphs Abs: 1.3 10*3/uL (ref 0.7–4.0)
MCH: 26.2 pg (ref 26.0–34.0)
MCHC: 28.9 g/dL — ABNORMAL LOW (ref 30.0–36.0)
MCV: 90.6 fL (ref 80.0–100.0)
Monocytes Absolute: 0.7 10*3/uL (ref 0.1–1.0)
Monocytes Relative: 4 %
Neutro Abs: 16.7 10*3/uL — ABNORMAL HIGH (ref 1.7–7.7)
Neutrophils Relative %: 88 %
Platelets: 193 10*3/uL (ref 150–400)
RBC: 3.85 MIL/uL — ABNORMAL LOW (ref 3.87–5.11)
RDW: 16.4 % — ABNORMAL HIGH (ref 11.5–15.5)
WBC: 19 10*3/uL — ABNORMAL HIGH (ref 4.0–10.5)
nRBC: 0 % (ref 0.0–0.2)

## 2018-02-17 LAB — HEPATIC FUNCTION PANEL
ALT: 70 U/L — ABNORMAL HIGH (ref 0–44)
AST: 142 U/L — ABNORMAL HIGH (ref 15–41)
Albumin: 2 g/dL — ABNORMAL LOW (ref 3.5–5.0)
Alkaline Phosphatase: 373 U/L — ABNORMAL HIGH (ref 38–126)
Bilirubin, Direct: 2.2 mg/dL — ABNORMAL HIGH (ref 0.0–0.2)
Indirect Bilirubin: 1.2 mg/dL — ABNORMAL HIGH (ref 0.3–0.9)
Total Bilirubin: 3.4 mg/dL — ABNORMAL HIGH (ref 0.3–1.2)
Total Protein: 5.4 g/dL — ABNORMAL LOW (ref 6.5–8.1)

## 2018-02-17 LAB — BASIC METABOLIC PANEL
Anion gap: 8 (ref 5–15)
BUN: 17 mg/dL (ref 8–23)
CALCIUM: 7.6 mg/dL — AB (ref 8.9–10.3)
CO2: 28 mmol/L (ref 22–32)
Chloride: 108 mmol/L (ref 98–111)
Creatinine, Ser: 0.68 mg/dL (ref 0.44–1.00)
GFR calc Af Amer: >60 mL/min (ref >60)
GFR calc non Af Amer: >60 mL/min (ref >60)
Glucose, Bld: 145 mg/dL — ABNORMAL HIGH (ref 70–99)
Potassium: 3.4 mmol/L — ABNORMAL LOW (ref 3.5–5.1)
Sodium: 144 mmol/L (ref 135–145)

## 2018-02-17 LAB — BLOOD CULTURE ID PANEL (REFLEXED)
Acinetobacter baumannii: NOT DETECTED
Candida albicans: NOT DETECTED
Candida glabrata: NOT DETECTED
Candida krusei: NOT DETECTED
Candida parapsilosis: NOT DETECTED
Candida tropicalis: NOT DETECTED
Carbapenem resistance: NOT DETECTED
Enterobacter cloacae complex: NOT DETECTED
Enterobacteriaceae species: DETECTED — AB
Enterococcus species: NOT DETECTED
Escherichia coli: DETECTED — AB
Haemophilus influenzae: NOT DETECTED
Klebsiella oxytoca: NOT DETECTED
Klebsiella pneumoniae: NOT DETECTED
Listeria monocytogenes: NOT DETECTED
Methicillin resistance: DETECTED — AB
Neisseria meningitidis: NOT DETECTED
Proteus species: NOT DETECTED
Pseudomonas aeruginosa: NOT DETECTED
Serratia marcescens: NOT DETECTED
Staphylococcus aureus (BCID): NOT DETECTED
Staphylococcus species: DETECTED — AB
Streptococcus agalactiae: NOT DETECTED
Streptococcus pneumoniae: NOT DETECTED
Streptococcus pyogenes: NOT DETECTED
Streptococcus species: NOT DETECTED

## 2018-02-17 LAB — CBG MONITORING, ED
Glucose-Capillary: 128 mg/dL — ABNORMAL HIGH (ref 70–99)
Glucose-Capillary: 61 mg/dL — ABNORMAL LOW (ref 70–99)
Glucose-Capillary: 79 mg/dL (ref 70–99)

## 2018-02-17 LAB — PROTIME-INR
INR: 1.42
Prothrombin Time: 17.2 seconds — ABNORMAL HIGH (ref 11.4–15.2)

## 2018-02-17 LAB — CREATININE, SERUM
Creatinine, Ser: 0.77 mg/dL (ref 0.44–1.00)
GFR calc Af Amer: >60 mL/min (ref >60)

## 2018-02-17 LAB — APTT: aPTT: 35 seconds (ref 24–36)

## 2018-02-17 LAB — PROCALCITONIN: Procalcitonin: 23.47 ng/mL

## 2018-02-17 LAB — TROPONIN I: TROPONIN I: 0.04 ng/mL — AB (ref <0.03)

## 2018-02-17 LAB — AMMONIA: AMMONIA: 25 umol/L (ref 9–35)

## 2018-02-17 LAB — LACTIC ACID, PLASMA: Lactic Acid, Venous: 1.2 mmol/L (ref 0.5–1.9)

## 2018-02-17 LAB — LIPASE, BLOOD: LIPASE: 21 U/L (ref 11–51)

## 2018-02-17 MED ORDER — SODIUM CHLORIDE 0.9 % IV SOLN
INTRAVENOUS | Status: DC
  Administered 2018-02-17: 21:00:00 via INTRAVENOUS

## 2018-02-17 MED ORDER — SODIUM CHLORIDE 0.9 % IV SOLN: 2.0000 g | Freq: Two times a day (BID) | INTRAVENOUS | Status: DC

## 2018-02-17 MED ORDER — ENOXAPARIN SODIUM 40 MG/0.4ML ~~LOC~~ SOLN: 40.0000 mg | SUBCUTANEOUS | Status: DC

## 2018-02-17 MED ORDER — ACETAMINOPHEN 650 MG RE SUPP: 650.0000 mg | Freq: Four times a day (QID) | RECTAL | Status: DC | PRN

## 2018-02-17 MED ORDER — ESCITALOPRAM OXALATE 10 MG PO TABS: 5.0000 mg | ORAL_TABLET | Freq: Every day | ORAL | Status: DC

## 2018-02-17 MED ORDER — ACETAMINOPHEN 325 MG PO TABS
650.0000 mg | ORAL_TABLET | Freq: Four times a day (QID) | ORAL | Status: DC | PRN
  Administered 2018-02-21: 650 mg via ORAL
  Filled 2018-02-17 (×2): qty 2

## 2018-02-17 MED ORDER — VENLAFAXINE HCL ER 75 MG PO CP24
75.0000 mg | ORAL_CAPSULE | Freq: Two times a day (BID) | ORAL | Status: DC
  Filled 2018-02-17: qty 1

## 2018-02-17 MED ORDER — ONDANSETRON HCL 4 MG PO TABS: 4.0000 mg | ORAL_TABLET | Freq: Four times a day (QID) | ORAL | Status: DC | PRN

## 2018-02-17 MED ORDER — LATANOPROST 0.005 % OP SOLN
1.0000 [drp] | Freq: Every day | OPHTHALMIC | Status: DC
  Administered 2018-02-17 – 2018-02-21 (×5): 1 [drp] via OPHTHALMIC
  Filled 2018-02-17 (×2): qty 2.5

## 2018-02-17 MED ORDER — OXYCODONE-ACETAMINOPHEN 5-325 MG PO TABS: 1.0000 | ORAL_TABLET | Freq: Three times a day (TID) | ORAL | Status: DC | PRN

## 2018-02-17 MED ORDER — SODIUM CHLORIDE 0.9 % IV SOLN
1.0000 g | Freq: Three times a day (TID) | INTRAVENOUS | Status: DC
  Administered 2018-02-17: 1 g via INTRAVENOUS
  Filled 2018-02-17 (×3): qty 1

## 2018-02-17 MED ORDER — SODIUM CHLORIDE 0.9 % IV SOLN
INTRAVENOUS | Status: DC
  Administered 2018-02-17 – 2018-02-18 (×2): via INTRAVENOUS

## 2018-02-17 MED ORDER — MIRTAZAPINE 7.5 MG PO TABS
7.5000 mg | ORAL_TABLET | Freq: Every day | ORAL | Status: DC
  Filled 2018-02-17: qty 1

## 2018-02-17 MED ORDER — SODIUM CHLORIDE 0.9 % IV SOLN
INTRAVENOUS | Status: DC
  Administered 2018-02-17: 02:00:00 via INTRAVENOUS

## 2018-02-17 MED ORDER — ALPRAZOLAM 0.5 MG PO TABS
0.5000 mg | ORAL_TABLET | Freq: Two times a day (BID) | ORAL | Status: DC
  Filled 2018-02-17: qty 1

## 2018-02-17 MED ORDER — SODIUM CHLORIDE 0.9 % IV SOLN
2.0000 g | INTRAVENOUS | Status: DC
  Administered 2018-02-17: 2 g via INTRAVENOUS
  Filled 2018-02-17: qty 20

## 2018-02-17 MED ORDER — SENNOSIDES-DOCUSATE SODIUM 8.6-50 MG PO TABS: 2.0000 | ORAL_TABLET | Freq: Two times a day (BID) | ORAL | Status: DC | PRN

## 2018-02-17 MED ORDER — VANCOMYCIN HCL 10 G IV SOLR
1500.0000 mg | INTRAVENOUS | Status: DC
  Administered 2018-02-17: 1500 mg via INTRAVENOUS
  Filled 2018-02-17 (×2): qty 1500

## 2018-02-17 MED ORDER — INDOMETHACIN 50 MG RE SUPP
100.0000 mg | Freq: Once | RECTAL | Status: AC
  Administered 2018-02-17: 100 mg via RECTAL
  Filled 2018-02-17: qty 2

## 2018-02-17 MED ORDER — GABAPENTIN 300 MG PO CAPS: 600.0000 mg | ORAL_CAPSULE | Freq: Three times a day (TID) | ORAL | Status: DC

## 2018-02-17 MED ORDER — BISACODYL 10 MG RE SUPP: 10.0000 mg | Freq: Every day | RECTAL | Status: DC | PRN

## 2018-02-17 MED ORDER — ONDANSETRON HCL 4 MG/2ML IJ SOLN
4.0000 mg | Freq: Four times a day (QID) | INTRAMUSCULAR | Status: DC | PRN
  Administered 2018-02-17 – 2018-02-22 (×3): 4 mg via INTRAVENOUS
  Filled 2018-02-17 (×2): qty 2

## 2018-02-17 NOTE — Progress Notes (Signed)
PHARMACY - PHYSICIAN COMMUNICATION CRITICAL VALUE ALERT - BLOOD CULTURE IDENTIFICATION (BCID)  Autumn Bryant is an 83 y.o. female who presented to [facility] on 02/16/2018 with a chief complaint of altered mental status, abdominal pain.   Assessment:  Patient admitted with sepsis- suspected sources are urine vs intra-abdominal.  Hx EBSL.   Name of physician (or Provider) Contacted: Arrien  Current antibiotics: Meropenem 1gm IV q8h + Vancomycin 1500mg  IV q24h  Changes to prescribed antibiotics recommended:  Continue Meropenem.  Consider D/C Vancomycin-likely contaminant.  Results for orders placed or performed during the hospital encounter of 02/16/18  Blood Culture ID Panel (Reflexed) (Collected: 02/16/2018  7:15 PM)  Result Value Ref Range   Enterococcus species NOT DETECTED NOT DETECTED   Listeria monocytogenes NOT DETECTED NOT DETECTED   Staphylococcus species DETECTED (A) NOT DETECTED   Staphylococcus aureus (BCID) NOT DETECTED NOT DETECTED   Methicillin resistance DETECTED (A) NOT DETECTED   Streptococcus species NOT DETECTED NOT DETECTED   Streptococcus agalactiae NOT DETECTED NOT DETECTED   Streptococcus pneumoniae NOT DETECTED NOT DETECTED   Streptococcus pyogenes NOT DETECTED NOT DETECTED   Acinetobacter baumannii NOT DETECTED NOT DETECTED   Enterobacteriaceae species DETECTED (A) NOT DETECTED   Enterobacter cloacae complex NOT DETECTED NOT DETECTED   Escherichia coli DETECTED (A) NOT DETECTED   Klebsiella oxytoca NOT DETECTED NOT DETECTED   Klebsiella pneumoniae NOT DETECTED NOT DETECTED   Proteus species NOT DETECTED NOT DETECTED   Serratia marcescens NOT DETECTED NOT DETECTED   Carbapenem resistance NOT DETECTED NOT DETECTED   Haemophilus influenzae NOT DETECTED NOT DETECTED   Neisseria meningitidis NOT DETECTED NOT DETECTED   Pseudomonas aeruginosa NOT DETECTED NOT DETECTED   Candida albicans NOT DETECTED NOT DETECTED   Candida glabrata NOT DETECTED NOT DETECTED    Candida krusei NOT DETECTED NOT DETECTED   Candida parapsilosis NOT DETECTED NOT DETECTED   Candida tropicalis NOT DETECTED NOT DETECTED    Biagio Borg 02/17/2018  11:26 AM

## 2018-02-17 NOTE — Consult Note (Addendum)
Referring Provider:  Dr. Hal Hope, [facility] Primary Care Physician:  [name], MD Primary Gastroenterologist:  Althia Forts   Reason for Consultation:  CBD stones  HPI: Autumn Bryant is a 83 y.o. female with history of diastolic dysfunction last 2D echo done in November 2018 was showing 65 to 70% EF with grade 1 diastolic dysfunction, anxiety, depression, polyneuropathy, history of TBI who was brought to the ER after patient had altered mental status since morning.  Patient also had a fever 101.5 F.  Further history was not available as patient was confused/lethargic.  Patient has had a cholecystectomy last year in June.  ED Course: In the ER patient initially was apparently initially communicative stating that she was having abdominal pain and not feeling well.  Labs showed the following elevated LFT's:  ALP 533->373, AST 168->142, ALT 81->70, total bili 3.4->3.4.  Normal 7 months ago post cholecystectomy.  Has leukocytosis to 19K.  CT scan abdomen and pelvis without contrast showed the following:  IMPRESSION: 1. Intrahepatic biliary ductal dilatation and multiple stones within the common bile duct. 2. Multiple pulmonary nodules measuring up to 7 mm. Non-contrast chest CT at 3-6 months is recommended. If the nodules are stable at time of repeat CT, then future CT at 18-24 months (from today's scan) is considered optional for low-risk patients, but is recommended for high-risk patients. This recommendation follows the consensus statement: Guidelines for Management of Incidental Pulmonary Nodules Detected on CT Images: From the Fleischner Society 2017; Radiology 2017; 284:228-243. 3. Small bilateral pleural effusions and small dependent lower lobe opacities which may represent associated atelectasis or pneumonia. 4. 6 mm stone in left proximal ureter with mild proximal pelvicaliectasis. 5. Chronic appearing T12, L3, L4 compression deformities.  Once again, at the time of our  interview she was very lethargic and not able to provide much information.  Said "I am sick."  And would say yes when asked if her belly hurt.   Past Medical History:  Diagnosis Date  . Anasarca 05/21/2016  . Anxiety 11/03/2016  . Closed TBI (traumatic brain injury) ([place]) 04/21/2016  . Dysphagia 07/13/2016  . Gout   . Hypertension   . Obesity (BMI 30.0-34.9) 05/21/2016  . Status post insertion of percutaneous endoscopic gastrostomy (PEG) tube ([place]) 04/21/2016    Past Surgical History:  Procedure Laterality Date  . CHOLECYSTECTOMY N/A 07/21/2017   Procedure: LAPAROSCOPIC CHOLECYSTECTOMY;  Surgeon: Erroll Luna, MD;  Location: [place];  Service: General;  Laterality: N/A;  . INTRAMEDULLARY (IM) NAIL INTERTROCHANTERIC Right 12/27/2016   Procedure: INTRAMEDULLARY (IM) NAIL INTERTROCHANTRIC RIGHT HIP;  Surgeon: Paralee Cancel, MD;  Location: [facility];  Service: Orthopedics;  Laterality: Right;  . MASTECTOMY      Prior to Admission medications   Medication Sig Start Date End Date Taking? Authorizing Provider  ALPRAZolam Duanne Moron) 0.5 MG tablet Take 0.5 mg by mouth 2 (two) times daily.   Yes [provider]  Amino Acids-Protein Hydrolys (FEEDING SUPPLEMENT, PRO-STAT SUGAR FREE 64,) LIQD Take 30 mLs by mouth 2 (two) times daily.   Yes [provider]  bimatoprost (LUMIGAN) 0.01 % SOLN Place 1 drop into both eyes at bedtime. For glaucoma   Yes [provider]  ENSURE (ENSURE) Take 237 mLs by mouth. TID DUE TO WEIGHT LOSS ( CHOCOLATE IF AVAILABLE )   Yes [provider]  escitalopram (LEXAPRO) 5 MG tablet Take 5 mg by mouth daily.   Yes [provider]  furosemide (LASIX) 20 MG tablet Take 20 mg  by mouth daily.   Yes [provider]  gabapentin (NEURONTIN) 600 MG tablet Take 600 mg by mouth 3 (three) times daily.   Yes [provider]  mirtazapine (REMERON) 7.5 MG tablet Take 7.5 mg by mouth. TAKE 1 TABLET BY MOUTH AT BEDTIME FOR APPETITE  STIMULANT   Yes [provider]  MULTIPLE VITAMINS-MINERALS PO Take by mouth. TAKE 1 TABLET BY MOUTH ONCE DAILY TO PROMOTE WOUND HEALING   Yes [provider]  oxyCODONE-acetaminophen (PERCOCET/ROXICET) 5-325 MG tablet Take 1 tablet by mouth every 8 (eight) hours. for pain   Yes [provider]  venlafaxine XR (EFFEXOR-XR) 75 MG 24 hr capsule Take 75 mg by mouth 2 (two) times daily. FOR DEPRESSION (DO NOT CRUSH)   Yes [provider]  bisacodyl (DULCOLAX) 10 MG suppository Place 10 mg rectally daily as needed (constipation not relieved by MOM).    [provider]  Magnesium Hydroxide (MILK OF MAGNESIA PO) Take 30 mLs by mouth daily as needed (if no BM in 3 days).    [provider]  sennosides-docusate sodium (SENOKOT-S) 8.6-50 MG tablet Take 2 tablets by mouth 2 (two) times daily as needed for constipation.    [provider]  Sodium Phosphates (FLEET ENEMA RE) Place 1 each rectally daily as needed (constipation not relieved by bisacodyl suppository).    [provider]    Current Facility-Administered Medications  Medication Dose Route Frequency Provider Last Rate Last Dose  . 0.9 %  sodium chloride infusion   Intravenous Continuous Rise Patience, MD 100 mL/hr at 02/17/18 0141    . acetaminophen (TYLENOL) tablet 650 mg  650 mg Oral Q6H PRN Rise Patience, MD       Or  . acetaminophen (TYLENOL) suppository 650 mg  650 mg Rectal Q6H PRN Rise Patience, MD      . bisacodyl (DULCOLAX) suppository 10 mg  10 mg Rectal Daily PRN Rise Patience, MD      . latanoprost (XALATAN) 0.005 % ophthalmic solution 1 drop  1 drop Both Eyes QHS Rise Patience, MD      . meropenem (MERREM) 1 g in sodium chloride 0.9 % 100 mL IVPB  1 g Intravenous Q8H Wofford, Drew A, RPH      . ondansetron (ZOFRAN) tablet 4 mg  4 mg Oral Q6H PRN Rise Patience, MD       Or  . ondansetron [facility]) injection 4 mg  4 mg  Intravenous Q6H PRN Rise Patience, MD      . potassium chloride SA (K-DUR,KLOR-CON) CR tablet 60 mEq  60 mEq Oral Once Rise Patience, MD      . vancomycin (VANCOCIN) 1,500 mg in sodium chloride 0.9 % 500 mL IVPB  1,500 mg Intravenous Q24H Rise Patience, MD       Current Outpatient Medications  Medication Sig Dispense Refill  . ALPRAZolam (XANAX) 0.5 MG tablet Take 0.5 mg by mouth 2 (two) times daily.    . Amino Acids-Protein Hydrolys (FEEDING SUPPLEMENT, PRO-STAT SUGAR FREE 64,) LIQD Take 30 mLs by mouth 2 (two) times daily.    . bimatoprost (LUMIGAN) 0.01 % SOLN Place 1 drop into both eyes at bedtime. For glaucoma    . ENSURE (ENSURE) Take 237 mLs by mouth. TID DUE TO WEIGHT LOSS ( CHOCOLATE IF AVAILABLE )    . escitalopram (LEXAPRO) 5 MG tablet Take 5 mg by mouth daily.    . furosemide (LASIX) 20 MG  tablet Take 20 mg by mouth daily.    Marland Kitchen gabapentin (NEURONTIN) 600 MG tablet Take 600 mg by mouth 3 (three) times daily.    . mirtazapine (REMERON) 7.5 MG tablet Take 7.5 mg by mouth. TAKE 1 TABLET BY MOUTH AT BEDTIME FOR APPETITE STIMULANT    . MULTIPLE VITAMINS-MINERALS PO Take by mouth. TAKE 1 TABLET BY MOUTH ONCE DAILY TO PROMOTE WOUND HEALING    . oxyCODONE-acetaminophen (PERCOCET/ROXICET) 5-325 MG tablet Take 1 tablet by mouth every 8 (eight) hours. for pain    . venlafaxine XR (EFFEXOR-XR) 75 MG 24 hr capsule Take 75 mg by mouth 2 (two) times daily. FOR DEPRESSION (DO NOT CRUSH)    . bisacodyl (DULCOLAX) 10 MG suppository Place 10 mg rectally daily as needed (constipation not relieved by MOM).    . Magnesium Hydroxide (MILK OF MAGNESIA PO) Take 30 mLs by mouth daily as needed (if no BM in 3 days).    . sennosides-docusate sodium (SENOKOT-S) 8.6-50 MG tablet Take 2 tablets by mouth 2 (two) times daily as needed for constipation.    . Sodium Phosphates (FLEET ENEMA RE) Place 1 each rectally daily as needed (constipation not relieved by bisacodyl suppository).       Allergies as of 02/16/2018 - Review Complete 02/16/2018  Allergen Reaction Noted  . Demerol [meperidine] Swelling 04/16/2016    Family History  Problem Relation Age of Onset  . Asthma Mother   . Anesthesia problems Neg Hx   . Arthritis Neg Hx   . Cancer Neg Hx   . Cerebral palsy Neg Hx   . Clotting disorder Neg Hx   . Club foot Neg Hx   . Collagen disease Neg Hx   . Deep vein thrombosis Neg Hx   . Gait disorder Neg Hx   . Heart disease Neg Hx   . Gout Neg Hx   . Diabetes Neg Hx   . Hip dysplasia Neg Hx   . Hip fracture Neg Hx   . Hypermobility Neg Hx   . Hypertension Neg Hx   . Osteoporosis Neg Hx   . Other Neg Hx   . Stroke Neg Hx   . Thyroid disease Neg Hx   . Vasculitis Neg Hx   . Spina bifida Neg Hx   . Scoliosis Neg Hx   . Rheumatologic disease Neg Hx   . Pulmonary embolism Neg Hx     Social History   Socioeconomic History  . Marital status: Widowed    Spouse name: Not on file  . Number of children: 1  . Years of education: 11th grade  . Highest education level: Not on file  Occupational History  . Occupation: retired  Scientific laboratory technician  . Financial resource strain: Not hard at all  . Food insecurity:    Worry: Never true    Inability: Never true  . Transportation needs:    Medical: No    Non-medical: No  Tobacco Use  . Smoking status: Never Smoker  . Smokeless tobacco: Never Used  Substance and Sexual Activity  . Alcohol use: Not Currently  . Drug use: No  . Sexual activity: Never  Lifestyle  . Physical activity:    Days per week: 0 days    Minutes per session: 0 min  . Stress: Not at all  Relationships  . Social connections:    Talks on phone: Not on file    Gets together: Not on file    Attends religious service: Never  Active member of club or organization: No    Attends meetings of clubs or organizations: Never    Relationship status: Widowed  . Intimate partner violence:    Fear of current or ex partner: Not on file     Emotionally abused: Not on file    Physically abused: Not on file    Forced sexual activity: Not on file  Other Topics Concern  . Not on file  Social History Narrative   Admitted to [facility] 04/15/16   Widowed   Never smoked   Alcohol none   Full code    Review of Systems: ROS is O/W negative except as mentioned in HPI.  Physical Exam: Vital signs in last 24 hours: Temp:  [98 F (36.7 C)-101.8 F (38.8 C)] 101.8 F (38.8 C) (01/13 2024) Pulse Rate:  [63-117] 71 (01/14 0700) Resp:  [11-23] 15 (01/14 0700) BP: (81-143)/(52-90) 92/56 (01/14 0700) SpO2:  [94 %-100 %] 99 % (01/14 0700) Weight:  [72.9 kg] 72.9 kg (01/13 1427)   General:  Alert, lethargic but arousable, in NAD Head:  Normocephalic and atraumatic. Eyes:  Sclera clear, no icterus.  Conjunctiva pink. Ears:  Normal auditory acuity. Mouth:  No deformity or lesions.   Lungs:  Clear throughout to auscultation.  No wheezes, crackles, or rhonchi.  Heart:  Regular rate and rhythm; no murmurs, clicks, rubs, or gallops. Abdomen:  Soft, non-distended.  BS quiet.  Diffuse TTP.  Msk:  Symmetrical without gross deformities. Pulses:  Normal pulses noted. Extremities:  Without clubbing or edema.  Upper extremities drawn in contracture position but able to get her to release. Neurologic:  Alert but lethargic.   Skin:  Intact without significant lesions or rashes.  Intake/Output from previous day: 01/13 0701 - 01/14 0700 In: 2780.9 [IV Piggyback:2780.9] Out: -   Lab Results: Recent Labs    02/16/18 1915 02/17/18 0435  WBC 18.1* 19.0*  HGB 12.5 10.1*  HCT 42.8 34.9*  PLT 265 193   BMET Recent Labs    02/16/18 1900 02/17/18 0325  NA 148* 144  K 2.8* 3.4*  CL 103 108  CO2 32 28  GLUCOSE 131* 145*  BUN 20 17  CREATININE 1.01* 0.68  0.77  CALCIUM 8.4* 7.6*   LFT Recent Labs    02/17/18 0325  PROT 5.4*  ALBUMIN 2.0*  AST 142*  ALT 70*  ALKPHOS 373*  BILITOT 3.4*  BILIDIR 2.2*  IBILI  1.2*   PT/INR Recent Labs    02/17/18 0325  LABPROT 17.2*  INR 1.42   Studies/Results: Ct Head Wo Contrast  Result Date: 02/17/2018 CLINICAL DATA:  Fever and altered mental status for 1 day. EXAM: CT HEAD WITHOUT CONTRAST TECHNIQUE: Contiguous axial images were obtained from the base of the skull through the vertex without intravenous contrast. COMPARISON:  10/18/2017 FINDINGS: Brain: Patient positioning limits examination. Area of encephalomalacia demonstrated in the right frontal lobe without change since prior study. Mild diffuse cerebral atrophy. Ventricular dilatation, partially related to encephalomalacia. Low-attenuation changes in the deep white matter likely represent small vessel ischemia. No change in appearance of intracranial contents since previous study. No acute mass effect or abnormal extra-axial fluid collections. No acute intracranial hemorrhage. Vascular: Intracranial arterial vascular calcifications are present in the vertebrobasilar and carotid arteries. Skull: Focal cranial ostomy in the right anterior frontal region. No acute depressed skull fractures identified. Sinuses/Orbits: Opacification of a posterior right ethmoid air cells. Paranasal sinuses are otherwise clear. Mastoid air cells are clear.  No change since prior study. Other: None. IMPRESSION: No acute intracranial abnormalities. Chronic atrophy and small vessel ischemic changes. Area of old encephalomalacia in the right frontal lobe unchanged. Electronically Signed   By: Lucienne Capers M.D.   On: 02/17/2018 03:06   US Abdomen Limited  Result Date: 02/16/2018 CLINICAL DATA:  83 y/o F; abdominal pain and abnormal liver function test. EXAM: ULTRASOUND ABDOMEN LIMITED RIGHT UPPER QUADRANT COMPARISON:  None. FINDINGS: Gallbladder: Not identified. Common bile duct: Diameter: 7 mm Liver: No focal lesion identified. Within normal limits in parenchymal echogenicity. Portal vein is patent on color Doppler imaging with normal  direction of blood flow towards the liver. IMPRESSION: Gallbladder not identified. Otherwise unremarkable right upper quadrant ultrasound. Electronically Signed   By: Kristine Garbe M.D.   On: 02/16/2018 22:45   Dg Chest Port 1 View  Result Date: 02/16/2018 CLINICAL DATA:  Fever EXAM: PORTABLE CHEST 1 VIEW COMPARISON:  July 19, 2017 FINDINGS: There is mild scarring in the left lower lobe. There is no edema or consolidation. Heart size and pulmonary vascularity are normal. No adenopathy. There is aortic atherosclerosis. Bones are osteoporotic. There are postoperative changes in the right breast region. There is evidence of an old fracture of the right proximal humerus with remodeling in this area. There is chronic anterior dislocation of the right shoulder. IMPRESSION: Scarring left base region. No edema or consolidation. Stable cardiac silhouette. There is aortic atherosclerosis. Bones osteoporotic. Chronic anterior dislocation right shoulder with evidence of old healed fracture right proximal humerus with remodeling. Postoperative change right breast region. Aortic Atherosclerosis (ICD10-I70.0). Electronically Signed   By: Lowella Grip III M.D.   On: 02/16/2018 19:42   Ct Renal Stone Study  Result Date: 02/17/2018 CLINICAL DATA:  83 y/o  F; fever and flank pain. EXAM: CT ABDOMEN AND PELVIS WITHOUT CONTRAST TECHNIQUE: Multidetector CT imaging of the abdomen and pelvis was performed following the standard protocol without IV contrast. COMPARISON:  None. FINDINGS: Lower chest: Small bilateral pleural effusions. Ill-defined bilateral dependent lower lobe opacities may represent atelectasis or pneumonia. 5 mm nodules in the right middle and lower lobes (series 3, image 2 and 4) and 7 mm nodule in the right middle lobe (series 3, image 11). Hepatobiliary: Diffuse intra biliary ductal dilatation. Multiple biliary stones within the common bile duct (series 4, image 51). Cholecystectomy. Pancreas:  Unremarkable. No pancreatic ductal dilatation or surrounding inflammatory changes. Spleen: Normal in size without focal abnormality. Adrenals/Urinary Tract: Normal adrenal glands. Normal right kidney and ureter. 6 mm stone in the proximal left ureter with mild left pelvicaliectasis (series 2, image 32). The downstream ureter is also mildly distended, however, no radiopaque stone is identified. Multiple bladder diverticula. Stomach/Bowel: Stomach is within normal limits. Appendix appears normal. No evidence of bowel wall thickening, distention, or inflammatory changes. Vascular/Lymphatic: Aortic atherosclerosis. No enlarged abdominal or pelvic lymph nodes. Reproductive: Uterus and bilateral adnexa are unremarkable. Other: No abdominal wall hernia or abnormality. No abdominopelvic ascites. Musculoskeletal: Severe T12, mild L3, mild L4 compression deformities. L4-5 grade 1 anterolisthesis. Chronic right proximal femur fracture intramedullary nail and proximal lag screw. 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 IMPRESSION: 1. Intrahepatic biliary ductal dilatation and multiple stones within the common bile duct. 2. Multiple pulmonary nodules measuring up to 7 mm. Non-contrast chest CT at 3-6 months is recommended. If the nodules are stable at time of repeat CT, then future CT at 18-24 months (from today's scan) is considered optional for  low-risk patients, but is recommended for high-risk patients. This recommendation follows the consensus statement: Guidelines for Management of Incidental Pulmonary Nodules Detected on CT Images: From the Fleischner Society 2017; Radiology 2017; 284:228-243. 3. Small bilateral pleural effusions and small dependent lower lobe opacities which may represent associated atelectasis or pneumonia. 4. 6 mm stone in left proximal ureter with mild proximal pelvicaliectasis. 5. Chronic appearing T12, L3, L4 compression deformities.  Electronically Signed   By: Kristine Garbe M.D.   On: 02/17/2018 03:11   IMPRESSION:  *83 year old female admitted with AMS and fever.  In the ED found to be febrile, hypotensive, tachy.  LFT's elevated and had a leukocytosis of 19K.  CT scan showing intrahepatic biliary dilation and multiple stones within the CBD.  ? Developing cholangitis.  She is s/p cholecystectomy in 07/2017.  PLAN: -Will plan for ERCP with stone removal on 1/15 with Dr. Ardis Hughs. -Is on meropenum.  Continue abx for now. -Trend labs/LFT's.  Laban Emperor. Zehr  02/17/2018, 9:17 AM   ________________________________________________________________________  Velora Heckler GI MD note:  I personally examined the patient, reviewed the data and agree with the assessment and plan described above.  SHe has numerous bile duct stones, extending into right hepatic duct even.  Planning ERCP tomorrow. Continue IV abx for now.   Owens Loffler, MD [facility] Pager [phone]

## 2018-02-17 NOTE — H&P (View-Only) (Signed)
Referring Provider:  Dr. Hal Hope, [facility] Primary Care Physician:  [name], MD Primary Gastroenterologist:  Althia Forts   Reason for Consultation:  CBD stones  HPI: Autumn Bryant is a 83 y.o. female with history of diastolic dysfunction last 2D echo done in November 2018 was showing 65 to 70% EF with grade 1 diastolic dysfunction, anxiety, depression, polyneuropathy, history of TBI who was brought to the ER after patient had altered mental status since morning.  Patient also had a fever 101.5 F.  Further history was not available as patient was confused/lethargic.  Patient has had a cholecystectomy last year in June.  ED Course: In the ER patient initially was apparently initially communicative stating that she was having abdominal pain and not feeling well.  Labs showed the following elevated LFT's:  ALP 533->373, AST 168->142, ALT 81->70, total bili 3.4->3.4.  Normal 7 months ago post cholecystectomy.  Has leukocytosis to 19K.  CT scan abdomen and pelvis without contrast showed the following:  IMPRESSION: 1. Intrahepatic biliary ductal dilatation and multiple stones within the common bile duct. 2. Multiple pulmonary nodules measuring up to 7 mm. Non-contrast chest CT at 3-6 months is recommended. If the nodules are stable at time of repeat CT, then future CT at 18-24 months (from today's scan) is considered optional for low-risk patients, but is recommended for high-risk patients. This recommendation follows the consensus statement: Guidelines for Management of Incidental Pulmonary Nodules Detected on CT Images: From the Fleischner Society 2017; Radiology 2017; 284:228-243. 3. Small bilateral pleural effusions and small dependent lower lobe opacities which may represent associated atelectasis or pneumonia. 4. 6 mm stone in left proximal ureter with mild proximal pelvicaliectasis. 5. Chronic appearing T12, L3, L4 compression deformities.  Once again, at the time of our  interview she was very lethargic and not able to provide much information.  Said "I am sick."  And would say yes when asked if her belly hurt.   Past Medical History:  Diagnosis Date  . Anasarca 05/21/2016  . Anxiety 11/03/2016  . Closed TBI (traumatic brain injury) ([place]) 04/21/2016  . Dysphagia 07/13/2016  . Gout   . Hypertension   . Obesity (BMI 30.0-34.9) 05/21/2016  . Status post insertion of percutaneous endoscopic gastrostomy (PEG) tube ([place]) 04/21/2016    Past Surgical History:  Procedure Laterality Date  . CHOLECYSTECTOMY N/A 07/21/2017   Procedure: LAPAROSCOPIC CHOLECYSTECTOMY;  Surgeon: Erroll Luna, MD;  Location: [place];  Service: General;  Laterality: N/A;  . INTRAMEDULLARY (IM) NAIL INTERTROCHANTERIC Right 12/27/2016   Procedure: INTRAMEDULLARY (IM) NAIL INTERTROCHANTRIC RIGHT HIP;  Surgeon: Paralee Cancel, MD;  Location: [facility];  Service: Orthopedics;  Laterality: Right;  . MASTECTOMY      Prior to Admission medications   Medication Sig Start Date End Date Taking? Authorizing Provider  ALPRAZolam Duanne Moron) 0.5 MG tablet Take 0.5 mg by mouth 2 (two) times daily.   Yes [provider]  Amino Acids-Protein Hydrolys (FEEDING SUPPLEMENT, PRO-STAT SUGAR FREE 64,) LIQD Take 30 mLs by mouth 2 (two) times daily.   Yes [provider]  bimatoprost (LUMIGAN) 0.01 % SOLN Place 1 drop into both eyes at bedtime. For glaucoma   Yes [provider]  ENSURE (ENSURE) Take 237 mLs by mouth. TID DUE TO WEIGHT LOSS ( CHOCOLATE IF AVAILABLE )   Yes [provider]  escitalopram (LEXAPRO) 5 MG tablet Take 5 mg by mouth daily.   Yes [provider]  furosemide (LASIX) 20 MG tablet Take 20 mg  by mouth daily.   Yes [provider]  gabapentin (NEURONTIN) 600 MG tablet Take 600 mg by mouth 3 (three) times daily.   Yes [provider]  mirtazapine (REMERON) 7.5 MG tablet Take 7.5 mg by mouth. TAKE 1 TABLET BY MOUTH AT BEDTIME FOR APPETITE  STIMULANT   Yes [provider]  MULTIPLE VITAMINS-MINERALS PO Take by mouth. TAKE 1 TABLET BY MOUTH ONCE DAILY TO PROMOTE WOUND HEALING   Yes [provider]  oxyCODONE-acetaminophen (PERCOCET/ROXICET) 5-325 MG tablet Take 1 tablet by mouth every 8 (eight) hours. for pain   Yes [provider]  venlafaxine XR (EFFEXOR-XR) 75 MG 24 hr capsule Take 75 mg by mouth 2 (two) times daily. FOR DEPRESSION (DO NOT CRUSH)   Yes [provider]  bisacodyl (DULCOLAX) 10 MG suppository Place 10 mg rectally daily as needed (constipation not relieved by MOM).    [provider]  Magnesium Hydroxide (MILK OF MAGNESIA PO) Take 30 mLs by mouth daily as needed (if no BM in 3 days).    [provider]  sennosides-docusate sodium (SENOKOT-S) 8.6-50 MG tablet Take 2 tablets by mouth 2 (two) times daily as needed for constipation.    [provider]  Sodium Phosphates (FLEET ENEMA RE) Place 1 each rectally daily as needed (constipation not relieved by bisacodyl suppository).    [provider]    Current Facility-Administered Medications  Medication Dose Route Frequency Provider Last Rate Last Dose  . 0.9 %  sodium chloride infusion   Intravenous Continuous Rise Patience, MD 100 mL/hr at 02/17/18 0141    . acetaminophen (TYLENOL) tablet 650 mg  650 mg Oral Q6H PRN Rise Patience, MD       Or  . acetaminophen (TYLENOL) suppository 650 mg  650 mg Rectal Q6H PRN Rise Patience, MD      . bisacodyl (DULCOLAX) suppository 10 mg  10 mg Rectal Daily PRN Rise Patience, MD      . latanoprost (XALATAN) 0.005 % ophthalmic solution 1 drop  1 drop Both Eyes QHS Rise Patience, MD      . meropenem (MERREM) 1 g in sodium chloride 0.9 % 100 mL IVPB  1 g Intravenous Q8H Wofford, Drew A, RPH      . ondansetron (ZOFRAN) tablet 4 mg  4 mg Oral Q6H PRN Rise Patience, MD       Or  . ondansetron [facility]) injection 4 mg  4 mg  Intravenous Q6H PRN Rise Patience, MD      . potassium chloride SA (K-DUR,KLOR-CON) CR tablet 60 mEq  60 mEq Oral Once Rise Patience, MD      . vancomycin (VANCOCIN) 1,500 mg in sodium chloride 0.9 % 500 mL IVPB  1,500 mg Intravenous Q24H Rise Patience, MD       Current Outpatient Medications  Medication Sig Dispense Refill  . ALPRAZolam (XANAX) 0.5 MG tablet Take 0.5 mg by mouth 2 (two) times daily.    . Amino Acids-Protein Hydrolys (FEEDING SUPPLEMENT, PRO-STAT SUGAR FREE 64,) LIQD Take 30 mLs by mouth 2 (two) times daily.    . bimatoprost (LUMIGAN) 0.01 % SOLN Place 1 drop into both eyes at bedtime. For glaucoma    . ENSURE (ENSURE) Take 237 mLs by mouth. TID DUE TO WEIGHT LOSS ( CHOCOLATE IF AVAILABLE )    . escitalopram (LEXAPRO) 5 MG tablet Take 5 mg by mouth daily.    . furosemide (LASIX) 20 MG  tablet Take 20 mg by mouth daily.    Marland Kitchen gabapentin (NEURONTIN) 600 MG tablet Take 600 mg by mouth 3 (three) times daily.    . mirtazapine (REMERON) 7.5 MG tablet Take 7.5 mg by mouth. TAKE 1 TABLET BY MOUTH AT BEDTIME FOR APPETITE STIMULANT    . MULTIPLE VITAMINS-MINERALS PO Take by mouth. TAKE 1 TABLET BY MOUTH ONCE DAILY TO PROMOTE WOUND HEALING    . oxyCODONE-acetaminophen (PERCOCET/ROXICET) 5-325 MG tablet Take 1 tablet by mouth every 8 (eight) hours. for pain    . venlafaxine XR (EFFEXOR-XR) 75 MG 24 hr capsule Take 75 mg by mouth 2 (two) times daily. FOR DEPRESSION (DO NOT CRUSH)    . bisacodyl (DULCOLAX) 10 MG suppository Place 10 mg rectally daily as needed (constipation not relieved by MOM).    . Magnesium Hydroxide (MILK OF MAGNESIA PO) Take 30 mLs by mouth daily as needed (if no BM in 3 days).    . sennosides-docusate sodium (SENOKOT-S) 8.6-50 MG tablet Take 2 tablets by mouth 2 (two) times daily as needed for constipation.    . Sodium Phosphates (FLEET ENEMA RE) Place 1 each rectally daily as needed (constipation not relieved by bisacodyl suppository).       Allergies as of 02/16/2018 - Review Complete 02/16/2018  Allergen Reaction Noted  . Demerol [meperidine] Swelling 04/16/2016    Family History  Problem Relation Age of Onset  . Asthma Mother   . Anesthesia problems Neg Hx   . Arthritis Neg Hx   . Cancer Neg Hx   . Cerebral palsy Neg Hx   . Clotting disorder Neg Hx   . Club foot Neg Hx   . Collagen disease Neg Hx   . Deep vein thrombosis Neg Hx   . Gait disorder Neg Hx   . Heart disease Neg Hx   . Gout Neg Hx   . Diabetes Neg Hx   . Hip dysplasia Neg Hx   . Hip fracture Neg Hx   . Hypermobility Neg Hx   . Hypertension Neg Hx   . Osteoporosis Neg Hx   . Other Neg Hx   . Stroke Neg Hx   . Thyroid disease Neg Hx   . Vasculitis Neg Hx   . Spina bifida Neg Hx   . Scoliosis Neg Hx   . Rheumatologic disease Neg Hx   . Pulmonary embolism Neg Hx     Social History   Socioeconomic History  . Marital status: Widowed    Spouse name: Not on file  . Number of children: 1  . Years of education: 11th grade  . Highest education level: Not on file  Occupational History  . Occupation: retired  Scientific laboratory technician  . Financial resource strain: Not hard at all  . Food insecurity:    Worry: Never true    Inability: Never true  . Transportation needs:    Medical: No    Non-medical: No  Tobacco Use  . Smoking status: Never Smoker  . Smokeless tobacco: Never Used  Substance and Sexual Activity  . Alcohol use: Not Currently  . Drug use: No  . Sexual activity: Never  Lifestyle  . Physical activity:    Days per week: 0 days    Minutes per session: 0 min  . Stress: Not at all  Relationships  . Social connections:    Talks on phone: Not on file    Gets together: Not on file    Attends religious service: Never  Active member of club or organization: No    Attends meetings of clubs or organizations: Never    Relationship status: Widowed  . Intimate partner violence:    Fear of current or ex partner: Not on file     Emotionally abused: Not on file    Physically abused: Not on file    Forced sexual activity: Not on file  Other Topics Concern  . Not on file  Social History Narrative   Admitted to [facility] 04/15/16   Widowed   Never smoked   Alcohol none   Full code    Review of Systems: ROS is O/W negative except as mentioned in HPI.  Physical Exam: Vital signs in last 24 hours: Temp:  [98 F (36.7 C)-101.8 F (38.8 C)] 101.8 F (38.8 C) (01/13 2024) Pulse Rate:  [63-117] 71 (01/14 0700) Resp:  [11-23] 15 (01/14 0700) BP: (81-143)/(52-90) 92/56 (01/14 0700) SpO2:  [94 %-100 %] 99 % (01/14 0700) Weight:  [72.9 kg] 72.9 kg (01/13 1427)   General:  Alert, lethargic but arousable, in NAD Head:  Normocephalic and atraumatic. Eyes:  Sclera clear, no icterus.  Conjunctiva pink. Ears:  Normal auditory acuity. Mouth:  No deformity or lesions.   Lungs:  Clear throughout to auscultation.  No wheezes, crackles, or rhonchi.  Heart:  Regular rate and rhythm; no murmurs, clicks, rubs, or gallops. Abdomen:  Soft, non-distended.  BS quiet.  Diffuse TTP.  Msk:  Symmetrical without gross deformities. Pulses:  Normal pulses noted. Extremities:  Without clubbing or edema.  Upper extremities drawn in contracture position but able to get her to release. Neurologic:  Alert but lethargic.   Skin:  Intact without significant lesions or rashes.  Intake/Output from previous day: 01/13 0701 - 01/14 0700 In: 2780.9 [IV Piggyback:2780.9] Out: -   Lab Results: Recent Labs    02/16/18 1915 02/17/18 0435  WBC 18.1* 19.0*  HGB 12.5 10.1*  HCT 42.8 34.9*  PLT 265 193   BMET Recent Labs    02/16/18 1900 02/17/18 0325  NA 148* 144  K 2.8* 3.4*  CL 103 108  CO2 32 28  GLUCOSE 131* 145*  BUN 20 17  CREATININE 1.01* 0.68  0.77  CALCIUM 8.4* 7.6*   LFT Recent Labs    02/17/18 0325  PROT 5.4*  ALBUMIN 2.0*  AST 142*  ALT 70*  ALKPHOS 373*  BILITOT 3.4*  BILIDIR 2.2*  IBILI  1.2*   PT/INR Recent Labs    02/17/18 0325  LABPROT 17.2*  INR 1.42   Studies/Results: Ct Head Wo Contrast  Result Date: 02/17/2018 CLINICAL DATA:  Fever and altered mental status for 1 day. EXAM: CT HEAD WITHOUT CONTRAST TECHNIQUE: Contiguous axial images were obtained from the base of the skull through the vertex without intravenous contrast. COMPARISON:  10/18/2017 FINDINGS: Brain: Patient positioning limits examination. Area of encephalomalacia demonstrated in the right frontal lobe without change since prior study. Mild diffuse cerebral atrophy. Ventricular dilatation, partially related to encephalomalacia. Low-attenuation changes in the deep white matter likely represent small vessel ischemia. No change in appearance of intracranial contents since previous study. No acute mass effect or abnormal extra-axial fluid collections. No acute intracranial hemorrhage. Vascular: Intracranial arterial vascular calcifications are present in the vertebrobasilar and carotid arteries. Skull: Focal cranial ostomy in the right anterior frontal region. No acute depressed skull fractures identified. Sinuses/Orbits: Opacification of a posterior right ethmoid air cells. Paranasal sinuses are otherwise clear. Mastoid air cells are clear.  No change since prior study. Other: None. IMPRESSION: No acute intracranial abnormalities. Chronic atrophy and small vessel ischemic changes. Area of old encephalomalacia in the right frontal lobe unchanged. Electronically Signed   By: Lucienne Capers M.D.   On: 02/17/2018 03:06   US Abdomen Limited  Result Date: 02/16/2018 CLINICAL DATA:  83 y/o F; abdominal pain and abnormal liver function test. EXAM: ULTRASOUND ABDOMEN LIMITED RIGHT UPPER QUADRANT COMPARISON:  None. FINDINGS: Gallbladder: Not identified. Common bile duct: Diameter: 7 mm Liver: No focal lesion identified. Within normal limits in parenchymal echogenicity. Portal vein is patent on color Doppler imaging with normal  direction of blood flow towards the liver. IMPRESSION: Gallbladder not identified. Otherwise unremarkable right upper quadrant ultrasound. Electronically Signed   By: Kristine Garbe M.D.   On: 02/16/2018 22:45   Dg Chest Port 1 View  Result Date: 02/16/2018 CLINICAL DATA:  Fever EXAM: PORTABLE CHEST 1 VIEW COMPARISON:  July 19, 2017 FINDINGS: There is mild scarring in the left lower lobe. There is no edema or consolidation. Heart size and pulmonary vascularity are normal. No adenopathy. There is aortic atherosclerosis. Bones are osteoporotic. There are postoperative changes in the right breast region. There is evidence of an old fracture of the right proximal humerus with remodeling in this area. There is chronic anterior dislocation of the right shoulder. IMPRESSION: Scarring left base region. No edema or consolidation. Stable cardiac silhouette. There is aortic atherosclerosis. Bones osteoporotic. Chronic anterior dislocation right shoulder with evidence of old healed fracture right proximal humerus with remodeling. Postoperative change right breast region. Aortic Atherosclerosis (ICD10-I70.0). Electronically Signed   By: Lowella Grip III M.D.   On: 02/16/2018 19:42   Ct Renal Stone Study  Result Date: 02/17/2018 CLINICAL DATA:  83 y/o  F; fever and flank pain. EXAM: CT ABDOMEN AND PELVIS WITHOUT CONTRAST TECHNIQUE: Multidetector CT imaging of the abdomen and pelvis was performed following the standard protocol without IV contrast. COMPARISON:  None. FINDINGS: Lower chest: Small bilateral pleural effusions. Ill-defined bilateral dependent lower lobe opacities may represent atelectasis or pneumonia. 5 mm nodules in the right middle and lower lobes (series 3, image 2 and 4) and 7 mm nodule in the right middle lobe (series 3, image 11). Hepatobiliary: Diffuse intra biliary ductal dilatation. Multiple biliary stones within the common bile duct (series 4, image 51). Cholecystectomy. Pancreas:  Unremarkable. No pancreatic ductal dilatation or surrounding inflammatory changes. Spleen: Normal in size without focal abnormality. Adrenals/Urinary Tract: Normal adrenal glands. Normal right kidney and ureter. 6 mm stone in the proximal left ureter with mild left pelvicaliectasis (series 2, image 32). The downstream ureter is also mildly distended, however, no radiopaque stone is identified. Multiple bladder diverticula. Stomach/Bowel: Stomach is within normal limits. Appendix appears normal. No evidence of bowel wall thickening, distention, or inflammatory changes. Vascular/Lymphatic: Aortic atherosclerosis. No enlarged abdominal or pelvic lymph nodes. Reproductive: Uterus and bilateral adnexa are unremarkable. Other: No abdominal wall hernia or abnormality. No abdominopelvic ascites. Musculoskeletal: Severe T12, mild L3, mild L4 compression deformities. L4-5 grade 1 anterolisthesis. Chronic right proximal femur fracture intramedullary nail and proximal lag screw. 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 IMPRESSION: 1. Intrahepatic biliary ductal dilatation and multiple stones within the common bile duct. 2. Multiple pulmonary nodules measuring up to 7 mm. Non-contrast chest CT at 3-6 months is recommended. If the nodules are stable at time of repeat CT, then future CT at 18-24 months (from today's scan) is considered optional for  low-risk patients, but is recommended for high-risk patients. This recommendation follows the consensus statement: Guidelines for Management of Incidental Pulmonary Nodules Detected on CT Images: From the Fleischner Society 2017; Radiology 2017; 284:228-243. 3. Small bilateral pleural effusions and small dependent lower lobe opacities which may represent associated atelectasis or pneumonia. 4. 6 mm stone in left proximal ureter with mild proximal pelvicaliectasis. 5. Chronic appearing T12, L3, L4 compression deformities.  Electronically Signed   By: Kristine Garbe M.D.   On: 02/17/2018 03:11   IMPRESSION:  *83 year old female admitted with AMS and fever.  In the ED found to be febrile, hypotensive, tachy.  LFT's elevated and had a leukocytosis of 19K.  CT scan showing intrahepatic biliary dilation and multiple stones within the CBD.  ? Developing cholangitis.  She is s/p cholecystectomy in 07/2017.  PLAN: -Will plan for ERCP with stone removal on 1/15 with Dr. Ardis Hughs. -Is on meropenum.  Continue abx for now. -Trend labs/LFT's.  Laban Emperor. Zehr  02/17/2018, 9:17 AM   ________________________________________________________________________  Velora Heckler GI MD note:  I personally examined the patient, reviewed the data and agree with the assessment and plan described above.  SHe has numerous bile duct stones, extending into right hepatic duct even.  Planning ERCP tomorrow. Continue IV abx for now.   Owens Loffler, MD [facility] Pager [phone]

## 2018-02-17 NOTE — H&P (Addendum)
History and Physical    SHENIQUA CAROLAN LFY:[id number] DOB: 16-May-1935 DOA: 02/16/2018  PCP: [name], MD  Patient coming from: Skilled nursing facility.  Chief Complaint: Altered mental status.  HPI: Autumn Bryant is a 83 y.o. female with history of diastolic dysfunction last 2D echo done in November 2018 was showing 65 to 70% EF with grade 1 diastolic dysfunction, anxiety, depression, polyneuropathy was brought to the ER after patient had altered mental status since morning patient also had a fever 101.5 F.  Further history was not available as patient was confused.  Patient has had a cholecystectomy last year in June.  ED Course: In the ER patient initially was communicative stating that she is having abdominal pain and not feeling well.  On arrival patient was having tender abdomen and labs done showed elevated LFTs with total bilirubin of 3.4 AST of 168 ALT of 81 and ultrasound abdomen done was showing nothing acute.  Patient was febrile with elevated lactate which was initially 2.9 which is further worsened to 3.2 but improved after fluid bolus.  WBC count was 18.  UA is consistent with UTI.  Patient was hypotensive but blood pressure improved with fluids.  Patient became more encephalopathic.  Patient was started on empiric antibiotics for severe sepsis and admitted for further management.  Sepsis source could be intra-abdominal likely UTI but also given the elevated LFTs will need to check CAT scan.  EKG shows sinus tachycardia.  Review of Systems: As per HPI, rest all negative.   Past Medical History:  Diagnosis Date  . Anasarca 05/21/2016  . Anxiety 11/03/2016  . Closed TBI (traumatic brain injury) ([place]) 04/21/2016  . Dysphagia 07/13/2016  . Gout   . Hypertension   . Obesity (BMI 30.0-34.9) 05/21/2016  . Status post insertion of percutaneous endoscopic gastrostomy (PEG) tube ([place]) 04/21/2016    Past Surgical History:  Procedure Laterality Date  . CHOLECYSTECTOMY N/A  07/21/2017   Procedure: LAPAROSCOPIC CHOLECYSTECTOMY;  Surgeon: Erroll Luna, MD;  Location: [facility];  Service: General;  Laterality: N/A;  . INTRAMEDULLARY (IM) NAIL INTERTROCHANTERIC Right 12/27/2016   Procedure: INTRAMEDULLARY (IM) NAIL INTERTROCHANTRIC RIGHT HIP;  Surgeon: Paralee Cancel, MD;  Location: [facility];  Service: Orthopedics;  Laterality: Right;  . MASTECTOMY       reports that she has never smoked. She has never used smokeless tobacco. She reports previous alcohol use. She reports that she does not use drugs.  Allergies  Allergen Reactions  . Demerol [Meperidine] Swelling    Family History  Problem Relation Age of Onset  . Asthma Mother   . Anesthesia problems Neg Hx   . Arthritis Neg Hx   . Cancer Neg Hx   . Cerebral palsy Neg Hx   . Clotting disorder Neg Hx   . Club foot Neg Hx   . Collagen disease Neg Hx   . Deep vein thrombosis Neg Hx   . Gait disorder Neg Hx   . Heart disease Neg Hx   . Gout Neg Hx   . Diabetes Neg Hx   . Hip dysplasia Neg Hx   . Hip fracture Neg Hx   . Hypermobility Neg Hx   . Hypertension Neg Hx   . Osteoporosis Neg Hx   . Other Neg Hx   . Stroke Neg Hx   . Thyroid disease Neg Hx   . Vasculitis Neg Hx   . Spina bifida Neg Hx   . Scoliosis Neg Hx   . Rheumatologic  disease Neg Hx   . Pulmonary embolism Neg Hx     Prior to Admission medications   Medication Sig Start Date End Date Taking? Authorizing Provider  ALPRAZolam Duanne Moron) 0.5 MG tablet Take 0.5 mg by mouth 2 (two) times daily.   Yes [provider]  Amino Acids-Protein Hydrolys (FEEDING SUPPLEMENT, PRO-STAT SUGAR FREE 64,) LIQD Take 30 mLs by mouth 2 (two) times daily.   Yes [provider]  bimatoprost (LUMIGAN) 0.01 % SOLN Place 1 drop into both eyes at bedtime. For glaucoma   Yes [provider]  ENSURE (ENSURE) Take 237 mLs by mouth. TID DUE TO WEIGHT LOSS ( CHOCOLATE IF AVAILABLE )   Yes [provider]  escitalopram (LEXAPRO) 5 MG  tablet Take 5 mg by mouth daily.   Yes [provider]  furosemide (LASIX) 20 MG tablet Take 20 mg by mouth daily.   Yes [provider]  gabapentin (NEURONTIN) 600 MG tablet Take 600 mg by mouth 3 (three) times daily.   Yes [provider]  mirtazapine (REMERON) 7.5 MG tablet Take 7.5 mg by mouth. TAKE 1 TABLET BY MOUTH AT BEDTIME FOR APPETITE STIMULANT   Yes [provider]  MULTIPLE VITAMINS-MINERALS PO Take by mouth. TAKE 1 TABLET BY MOUTH ONCE DAILY TO PROMOTE WOUND HEALING   Yes [provider]  oxyCODONE-acetaminophen (PERCOCET/ROXICET) 5-325 MG tablet Take 1 tablet by mouth every 8 (eight) hours. for pain   Yes [provider]  venlafaxine XR (EFFEXOR-XR) 75 MG 24 hr capsule Take 75 mg by mouth 2 (two) times daily. FOR DEPRESSION (DO NOT CRUSH)   Yes [provider]  bisacodyl (DULCOLAX) 10 MG suppository Place 10 mg rectally daily as needed (constipation not relieved by MOM).    [provider]  Magnesium Hydroxide (MILK OF MAGNESIA PO) Take 30 mLs by mouth daily as needed (if no BM in 3 days).    [provider]  sennosides-docusate sodium (SENOKOT-S) 8.6-50 MG tablet Take 2 tablets by mouth 2 (two) times daily as needed for constipation.    [provider]  Sodium Phosphates (FLEET ENEMA RE) Place 1 each rectally daily as needed (constipation not relieved by bisacodyl suppository).    [provider]    Physical Exam: Vitals:   02/16/18 2330 02/17/18 0000 02/17/18 0030 02/17/18 0058  BP: (!) 102/58 91/62 (!) 88/64 98/70  Pulse: 94 92 90 86  Resp: 14 15 14 20   Temp:      TempSrc:      SpO2: 100% 98% 97% 99%      Constitutional: Moderately built and nourished. Vitals:   02/16/18 2330 02/17/18 0000 02/17/18 0030 02/17/18 0058  BP: (!) 102/58 91/62 (!) 88/64 98/70  Pulse: 94 92 90 86  Resp: 14 15 14 20   Temp:      TempSrc:      SpO2: 100% 98% 97% 99%   Eyes: Anicteric no  pallor. ENMT: No discharge from the ears eyes nose or mouth. Neck: No mass felt.  No neck rigidity. Respiratory: No rhonchi or crepitations. Cardiovascular: S1-S2 heard. Abdomen: Soft nontender bowel sounds present. Musculoskeletal: No edema. Skin: No rash. Neurologic: Patient encephalopathic but arousable.  Does not follow commands.  Pupils are reacting. Psychiatric: Encephalopathic.     Labs on Admission: I have personally reviewed following labs and imaging studies  CBC: Recent Labs  Lab 02/16/18 1915  WBC 18.1*  NEUTROABS 16.8*  HGB 12.5  HCT 42.8  MCV 89.5  PLT  093   Basic Metabolic Panel: Recent Labs  Lab 02/16/18 1900 02/16/18 2227  NA 148*  --   K 2.8*  --   CL 103  --   CO2 32  --   GLUCOSE 131*  --   BUN 20  --   CREATININE 1.01*  --   CALCIUM 8.4*  --   MG  --  2.1   GFR: Estimated Creatinine Clearance: 44.5 mL/min (A) (by C-G formula based on SCr of 1.01 mg/dL (H)). Liver Function Tests: Recent Labs  Lab 02/16/18 1900  AST 168*  ALT 81*  ALKPHOS 533*  BILITOT 3.4*  PROT 7.0  ALBUMIN 2.8*   No results for input(s): LIPASE, AMYLASE in the last 168 hours. No results for input(s): AMMONIA in the last 168 hours. Coagulation Profile: No results for input(s): INR, PROTIME in the last 168 hours. Cardiac Enzymes: No results for input(s): CKTOTAL, CKMB, CKMBINDEX, TROPONINI in the last 168 hours. BNP (last 3 results) No results for input(s): PROBNP in the last 8760 hours. HbA1C: No results for input(s): HGBA1C in the last 72 hours. CBG: No results for input(s): GLUCAP in the last 168 hours. Lipid Profile: No results for input(s): CHOL, HDL, LDLCALC, TRIG, CHOLHDL, LDLDIRECT in the last 72 hours. Thyroid Function Tests: No results for input(s): TSH, T4TOTAL, FREET4, T3FREE, THYROIDAB in the last 72 hours. Anemia Panel: No results for input(s): VITAMINB12, FOLATE, FERRITIN, TIBC, IRON, RETICCTPCT in the last 72 hours. Urine analysis:      Component Value Date/Time   COLORURINE AMBER (A) 02/16/2018 1915   APPEARANCEUR HAZY (A) 02/16/2018 1915   LABSPEC 1.016 02/16/2018 1915   PHURINE 5.0 02/16/2018 1915   GLUCOSEU NEGATIVE 02/16/2018 1915   HGBUR SMALL (A) 02/16/2018 1915   BILIRUBINUR SMALL (A) 02/16/2018 [place] NEGATIVE 02/16/2018 1915   PROTEINUR 30 (A) 02/16/2018 1915   NITRITE NEGATIVE 02/16/2018 1915   LEUKOCYTESUR MODERATE (A) 02/16/2018 1915   Sepsis Labs: @LABRCNTIP (procalcitonin:4,lacticidven:4) )No results found for this or any previous visit (from the past 240 hour(s)).   Radiological Exams on Admission: US Abdomen Limited  Result Date: 02/16/2018 CLINICAL DATA:  83 y/o F; abdominal pain and abnormal liver function test. EXAM: ULTRASOUND ABDOMEN LIMITED RIGHT UPPER QUADRANT COMPARISON:  None. FINDINGS: Gallbladder: Not identified. Common bile duct: Diameter: 7 mm Liver: No focal lesion identified. Within normal limits in parenchymal echogenicity. Portal vein is patent on color Doppler imaging with normal direction of blood flow towards the liver. IMPRESSION: Gallbladder not identified. Otherwise unremarkable right upper quadrant ultrasound. Electronically Signed   By: Kristine Garbe M.D.   On: 02/16/2018 22:45   Dg Chest Port 1 View  Result Date: 02/16/2018 CLINICAL DATA:  Fever EXAM: PORTABLE CHEST 1 VIEW COMPARISON:  July 19, 2017 FINDINGS: There is mild scarring in the left lower lobe. There is no edema or consolidation. Heart size and pulmonary vascularity are normal. No adenopathy. There is aortic atherosclerosis. Bones are osteoporotic. There are postoperative changes in the right breast region. There is evidence of an old fracture of the right proximal humerus with remodeling in this area. There is chronic anterior dislocation of the right shoulder. IMPRESSION: Scarring left base region. No edema or consolidation. Stable cardiac silhouette. There is aortic atherosclerosis. Bones  osteoporotic. Chronic anterior dislocation right shoulder with evidence of old healed fracture right proximal humerus with remodeling. Postoperative change right breast region. Aortic Atherosclerosis (ICD10-I70.0). Electronically Signed   By: Lowella Grip III M.D.   On: 02/16/2018  19:42    EKG: Independently reviewed.  Sinus tachycardia.  Assessment/Plan Principal Problem:   Sepsis ([place]) Active Problems:   Urinary tract infection due to extended-spectrum beta lactamase (ESBL) producing Escherichia coli   Polyneuropathy    1. Sepsis likely from intra-abdominal cause UA does show signs of UTI.  Since patient abdominal pain with elevated LFTs we will check his CAT scan to make sure there is no biliary cause.  We will keep patient n.p.o. continue with empiric antibiotics continue with IV fluids follow lactate procalcitonin follow blood cultures. 2. Acute encephalopathy secondary to sepsis. 3. History of depression anxiety presently n.p.o. 4. History of polyneuropathy -presently n.p.o.  Usually takes gabapentin. 5. History of diastolic dysfunction presently on IV fluids for sepsis.  Closely monitor respiratory status. 6. Hypokalemia - ould be from lasix. Replace and recheck.Check Magnesium levels.  Addendum -CT renal study shows intrahepatic biliary dilatation with multiple stones in the common bile duct.  CT head is unremarkable.  Will consult GI for possible ERCP and also will discuss with pulmonary critical care.   DVT prophylaxis: SCDs. Code Status: Full code. Family Communication: No family at the bedside. Disposition Plan: To be determined. Consults called: We will consult GI and pulmonary critical care. Admission status: Inpatient.   Rise Patience MD [facility] Pager [phone].  If 7PM-7AM, please contact night-coverage [email or web address] Password [email or web address]  02/17/2018, 1:11 AM

## 2018-02-17 NOTE — Progress Notes (Signed)
Pharmacy Antibiotic Note  Autumn Bryant is a 83 y.o. female admitted on 02/16/2018 with sepsis.  Pharmacy has been consulted for Vancomycin, cefepime  dosing.  Plan: Cefepime 2gm iv q12hr  Vancomycin 1500 mg IV Q 24 hrs.  Goal AUC 400-550. Expected AUC: 492 SCr used: 0.8 (adjusted)      Temp (24hrs), Avg:99.9 F (37.7 C), Min:98 F (36.7 C), Max:101.8 F (38.8 C)  Recent Labs  Lab 02/16/18 1900 02/16/18 1915 02/16/18 1926 02/16/18 2207 02/17/18 0325  WBC  --  18.1*  --   --   --   CREATININE 1.01*  --   --   --  0.68  0.77  LATICACIDVEN  --   --  2.94* 3.24* 1.2    Estimated Creatinine Clearance: 56.1 mL/min (by C-G formula based on SCr of 0.77 mg/dL).    Allergies  Allergen Reactions  . Demerol [Meperidine] Swelling    Antimicrobials this admission: Vancomycin 02/16/2018 >> Cefepime 02/16/2018 >>   Dose adjustments this admission: -  Microbiology results: -  Thank you for allowing pharmacy to be a part of this patient's care.  Nani Skillern Crowford 02/17/2018 5:22 AM

## 2018-02-17 NOTE — Progress Notes (Signed)
Pharmacy - meropenem  Assessment:    Please see note from Raelene Bott, PharmD earlier today for full details.  Briefly, 83 y.o. female on vancomycin and cefepime for sepsis. MD changing cefepime to meropenem given Hx ESBL (urine, 12/2016)  Plan:   Cefepime discontinued  Begin meropenem 1g IV q8 hr  F/u Cx, SCr, clinical course  Reuel Boom, PharmD, BCPS [phone] 02/17/2018, 7:13 AM

## 2018-02-17 NOTE — Progress Notes (Signed)
I tried to contact the patient's son by phone 3 times today to obtain consent for her ERCP procedure.  Goes directly to voicemail on each occasion.

## 2018-02-17 NOTE — Progress Notes (Signed)
PROGRESS NOTE    Autumn Bryant  HWE:[id number] DOB: 1935-03-01 DOA: 02/16/2018 PCP: [name], MD    Brief Narrative:  83 year old female who presented with altered mental status.  She does have significant past medical history for diastolic heart failure, anxiety, depression and polyneuropathy.  Patient was brought to the hospital due to altered mentation, abdominal pain and fever.  Unable to get further detailed history due to patient's confusion.  On her initial physical examination her blood pressure was 88/64, heart rate 94, respiratory rate 14, oxygen saturation 97%.  She was confused and disoriented, her lungs were clear to auscultation, heart S1-S2 present rhythmic, her abdomen was soft nontender, no lower extremity edema.  Sodium 148, potassium 2.8, chloride 103, bicarb 32, glucose 131, BUN 20, creatinine 1.0, alkaline phosphatase 533, AST 168, ALT 81, total bilirubin is 3.4, troponin I 0.04, lactic acid 2.9, 3.2.  Ammonia 25.  White cell count 18.1, hemoglobin 12.5, hematocrit 42.8, platelets 265.  INR 1.4.  Urinalysis more than 50 white cells, protein 30.  CT with intrahepatic biliary duct dilatation with multiple stones within the common bile duct.  Notable pulmonary nodules.  Chest radiograph with no infiltrates.  EKG with normal sinus rhythm, left axis deviation, poor R wave progression.  Patient was admitted to the hospital working diagnosis of sepsis, likely related to common bile duct obstruction.   Assessment & Plan:   Principal Problem:   Sepsis ([place]) Active Problems:   Urinary tract infection due to extended-spectrum beta lactamase (ESBL) producing Escherichia coli   Polyneuropathy   1. Sepsis due to common biliary duct obstruction (present on admission). Positive blood culture for E coli and staph MRSA. Will cover E coli with ceftriaxone and will keep Vancomycin for now, can be a contaminant but due to acute illness will not de-escalate antibiotic for now, will  follow on further blood cultures. Continue isotonic saline at 100 ml per hour, telemetry monitoring and admission to step down unit. Follow with GI for ERCP.   2. Hypokalemia. Continue K correction with Kcl, will follow on renal panel in am. Preserved renal function, will continue isotonic saline at 100 ml per H.   3. Depression. At home on escitalopram, mirtazapine, and venlafaxine, continue on hold for now due to encephalopathy.   4. Metabolic encephalopathy. Patient is more reactive, will continue neuro checks per unit protocol and aspiration precautions.   DVT prophylaxis: enoxaparin   Code Status: full Family Communication: no family at the bedside  Disposition Plan/ discharge barriers: step down unit   There is no height or weight on file to calculate BMI. Malnutrition Type:      Malnutrition Characteristics:      Nutrition Interventions:     RN Pressure Injury Documentation: Pressure Injury 07/20/17 Stage I -  Intact skin with non-blanchable redness of a localized area usually over a bony prominence. (Active)  07/20/17 0357  Location: Heel  Location Orientation: Right;Left  Staging: Stage I -  Intact skin with non-blanchable redness of a localized area usually over a bony prominence.  Wound Description (Comments):   Present on Admission: Yes     Consultants:   GI   Procedures:     Antimicrobials:   Ceftriaxone   Vancomycin     Subjective: Patient is somnolent but easy to arouse, continue to have abdominal pain, no nausea or vomiting, no dyspnea or chest pain.   Objective: Vitals:   02/17/18 0554 02/17/18 0700 02/17/18 0930 02/17/18 1000  BP: (!) 91/54 Marland Kitchen)  92/56 (!) 91/51 103/61  Pulse: 64 71 65 66  Resp: 12 15 12 13   Temp:      TempSrc:      SpO2: 100% 99% 99% 100%    Intake/Output Summary (Last 24 hours) at 02/17/2018 1011 Last data filed at 02/17/2018 0242 Gross per 24 hour  Intake 2780.91 ml  Output -  Net 2780.91 ml   There were no  vitals filed for this visit.  Examination:   General: deconditioned and ill looking appearing.  Neurology: patient very somnolent, but easy to arouse, respond to voice and touch.   E ENT: positive pallor, no icterus, oral mucosa moist Cardiovascular: No JVD. S1-S2 present, rhythmic, no gallops, rubs, or murmurs. Trace lower extremity edema. Pulmonary: decreased breath sounds bilaterally, poor air movement, no wheezing, rhonchi or rales. Gastrointestinal. Abdomen distended, tender to palpation RUQ, worse to deep palpation with no organomegaly,  Skin. No rashes Musculoskeletal: no joint deformities     Data Reviewed: I have personally reviewed following labs and imaging studies  CBC: Recent Labs  Lab 02/16/18 1915 02/17/18 0435  WBC 18.1* 19.0*  NEUTROABS 16.8* 16.7*  HGB 12.5 10.1*  HCT 42.8 34.9*  MCV 89.5 90.6  PLT 265 449   Basic Metabolic Panel: Recent Labs  Lab 02/16/18 1900 02/16/18 2227 02/17/18 0325  NA 148*  --  144  K 2.8*  --  3.4*  CL 103  --  108  CO2 32  --  28  GLUCOSE 131*  --  145*  BUN 20  --  17  CREATININE 1.01*  --  0.68  0.77  CALCIUM 8.4*  --  7.6*  MG  --  2.1  --    GFR: Estimated Creatinine Clearance: 56.1 mL/min (by C-G formula based on SCr of 0.77 mg/dL). Liver Function Tests: Recent Labs  Lab 02/16/18 1900 02/17/18 0325  AST 168* 142*  ALT 81* 70*  ALKPHOS 533* 373*  BILITOT 3.4* 3.4*  PROT 7.0 5.4*  ALBUMIN 2.8* 2.0*   Recent Labs  Lab 02/17/18 0325  LIPASE 21   Recent Labs  Lab 02/17/18 0435  AMMONIA 25   Coagulation Profile: Recent Labs  Lab 02/17/18 0325  INR 1.42   Cardiac Enzymes: Recent Labs  Lab 02/17/18 0325  TROPONINI 0.04*   BNP (last 3 results) No results for input(s): PROBNP in the last 8760 hours. HbA1C: No results for input(s): HGBA1C in the last 72 hours. CBG: Recent Labs  Lab 02/17/18 0434  GLUCAP 128*   Lipid Profile: No results for input(s): CHOL, HDL, LDLCALC, TRIG, CHOLHDL,  LDLDIRECT in the last 72 hours. Thyroid Function Tests: No results for input(s): TSH, T4TOTAL, FREET4, T3FREE, THYROIDAB in the last 72 hours. Anemia Panel: No results for input(s): VITAMINB12, FOLATE, FERRITIN, TIBC, IRON, RETICCTPCT in the last 72 hours.    Radiology Studies: I have reviewed all of the imaging during this hospital visit personally     Scheduled Meds: . latanoprost  1 drop Both Eyes QHS  . potassium chloride  60 mEq Oral Once   Continuous Infusions: . sodium chloride 100 mL/hr at 02/17/18 0141  . meropenem (MERREM) IV 1 g (02/17/18 1001)  . vancomycin       LOS: 0 days        Tawni Millers, MD [facility] Pager [phone]

## 2018-02-17 NOTE — ED Notes (Signed)
Pt continues to holler for "Ron"

## 2018-02-18 ENCOUNTER — Inpatient Hospital Stay (HOSPITAL_COMMUNITY): Payer: Medicare Other

## 2018-02-18 ENCOUNTER — Encounter (HOSPITAL_COMMUNITY)
Admission: EM | Disposition: A | Discharge status: Skilled Nursing Facility | Payer: Self-pay | Source: Skilled Nursing Facility | Attending: Internal Medicine

## 2018-02-18 ENCOUNTER — Encounter: Payer: Self-pay | Admitting: Internal Medicine

## 2018-02-18 ENCOUNTER — Inpatient Hospital Stay (HOSPITAL_COMMUNITY): Payer: Medicare Other | Admitting: Registered Nurse

## 2018-02-18 ENCOUNTER — Encounter (HOSPITAL_COMMUNITY): Payer: Self-pay | Admitting: *Deleted

## 2018-02-18 DIAGNOSIS — G629 Polyneuropathy, unspecified: Secondary | ICD-10-CM

## 2018-02-18 DIAGNOSIS — B9629 Other Escherichia coli [E. coli] as the cause of diseases classified elsewhere: Secondary | ICD-10-CM

## 2018-02-18 DIAGNOSIS — N390 Urinary tract infection, site not specified: Secondary | ICD-10-CM

## 2018-02-18 DIAGNOSIS — Z1612 Extended spectrum beta lactamase (ESBL) resistance: Secondary | ICD-10-CM

## 2018-02-18 DIAGNOSIS — K8050 Calculus of bile duct without cholangitis or cholecystitis without obstruction: Secondary | ICD-10-CM

## 2018-02-18 HISTORY — PX: REMOVAL OF STONES: SHX5545

## 2018-02-18 HISTORY — PX: ERCP: SHX5425

## 2018-02-18 HISTORY — PX: SPHINCTEROTOMY: SHX5544

## 2018-02-18 LAB — GLUCOSE, CAPILLARY
GLUCOSE-CAPILLARY: 104 mg/dL — AB (ref 70–99)
GLUCOSE-CAPILLARY: 96 mg/dL (ref 70–99)
Glucose-Capillary: 85 mg/dL (ref 70–99)
Glucose-Capillary: 112 mg/dL — ABNORMAL HIGH (ref 70–99)
Glucose-Capillary: 104 mg/dL — ABNORMAL HIGH (ref 70–99)

## 2018-02-18 LAB — CBC WITH DIFFERENTIAL/PLATELET
Abs Immature Granulocytes: 0.1 10*3/uL — ABNORMAL HIGH (ref 0.00–0.07)
BASOS ABS: 0 10*3/uL (ref 0.0–0.1)
Basophils Relative: 0 %
Eosinophils Absolute: 0 10*3/uL (ref 0.0–0.5)
Eosinophils Relative: 0 %
HCT: 40.8 % (ref 36.0–46.0)
Hemoglobin: 12.1 g/dL (ref 12.0–15.0)
Immature Granulocytes: 1 %
LYMPHS PCT: 6 %
Lymphs Abs: 0.8 10*3/uL (ref 0.7–4.0)
MCH: 26.1 pg (ref 26.0–34.0)
MCHC: 29.7 g/dL — ABNORMAL LOW (ref 30.0–36.0)
MCV: 87.9 fL (ref 80.0–100.0)
Monocytes Absolute: 0.4 10*3/uL (ref 0.1–1.0)
Monocytes Relative: 3 %
NEUTROS ABS: 13.2 10*3/uL — AB (ref 1.7–7.7)
Neutrophils Relative %: 90 %
Platelets: 124 10*3/uL — ABNORMAL LOW (ref 150–400)
RBC: 4.64 MIL/uL (ref 3.87–5.11)
RDW: 16.7 % — ABNORMAL HIGH (ref 11.5–15.5)
WBC: 14.6 10*3/uL — ABNORMAL HIGH (ref 4.0–10.5)
nRBC: 0 % (ref 0.0–0.2)

## 2018-02-18 LAB — BASIC METABOLIC PANEL
Anion gap: 11 (ref 5–15)
BUN: 14 mg/dL (ref 8–23)
CO2: 27 mmol/L (ref 22–32)
Calcium: 8 mg/dL — ABNORMAL LOW (ref 8.9–10.3)
Chloride: 109 mmol/L (ref 98–111)
Creatinine, Ser: 0.68 mg/dL (ref 0.44–1.00)
GFR calc Af Amer: >60 mL/min (ref >60)
GFR calc non Af Amer: >60 mL/min (ref >60)
Glucose, Bld: 108 mg/dL — ABNORMAL HIGH (ref 70–99)
Potassium: 3.2 mmol/L — ABNORMAL LOW (ref 3.5–5.1)
Sodium: 147 mmol/L — ABNORMAL HIGH (ref 135–145)

## 2018-02-18 LAB — LACTIC ACID, PLASMA: Lactic Acid, Venous: 1.8 mmol/L (ref 0.5–1.9)

## 2018-02-18 LAB — MRSA PCR SCREENING: MRSA by PCR: NEGATIVE

## 2018-02-18 SURGERY — ERCP, WITH INTERVENTION IF INDICATED
Anesthesia: General

## 2018-02-18 MED ORDER — PROPOFOL 10 MG/ML IV BOLUS
INTRAVENOUS | Status: AC
  Filled 2018-02-18: qty 20

## 2018-02-18 MED ORDER — SODIUM CHLORIDE 0.9 % IV SOLN
1.0000 g | Freq: Three times a day (TID) | INTRAVENOUS | Status: DC
  Administered 2018-02-18 – 2018-02-21 (×10): 1 g via INTRAVENOUS
  Filled 2018-02-18 (×13): qty 1

## 2018-02-18 MED ORDER — FENTANYL CITRATE (PF) 100 MCG/2ML IJ SOLN
INTRAMUSCULAR | Status: AC
  Filled 2018-02-18: qty 2

## 2018-02-18 MED ORDER — PROPOFOL 10 MG/ML IV BOLUS
INTRAVENOUS | Status: DC | PRN
  Administered 2018-02-18: 120 mg via INTRAVENOUS

## 2018-02-18 MED ORDER — ENSURE ENLIVE PO LIQD
237.0000 mL | Freq: Two times a day (BID) | ORAL | Status: DC
  Administered 2018-02-19 – 2018-02-22 (×3): 237 mL via ORAL

## 2018-02-18 MED ORDER — SODIUM CHLORIDE 0.9 % IV SOLN
INTRAVENOUS | Status: DC | PRN
  Administered 2018-02-18: 12:00:00

## 2018-02-18 MED ORDER — FENTANYL CITRATE (PF) 250 MCG/5ML IJ SOLN
INTRAMUSCULAR | Status: DC | PRN
  Administered 2018-02-18: 50 ug via INTRAVENOUS

## 2018-02-18 MED ORDER — ROCURONIUM BROMIDE 10 MG/ML (PF) SYRINGE
PREFILLED_SYRINGE | INTRAVENOUS | Status: DC | PRN
  Administered 2018-02-18: 40 mg via INTRAVENOUS

## 2018-02-18 MED ORDER — PROPOFOL 10 MG/ML IV BOLUS
INTRAVENOUS | Status: AC
  Filled 2018-02-18: qty 40

## 2018-02-18 MED ORDER — POTASSIUM CHLORIDE 10 MEQ/100ML IV SOLN
10.0000 meq | INTRAVENOUS | Status: AC
  Administered 2018-02-18 (×3): 10 meq via INTRAVENOUS
  Filled 2018-02-18 (×5): qty 100

## 2018-02-18 MED ORDER — INDOMETHACIN 50 MG RE SUPP
RECTAL | Status: DC | PRN
  Administered 2018-02-18: 100 mg via RECTAL

## 2018-02-18 MED ORDER — PHENYLEPHRINE 40 MCG/ML (10ML) SYRINGE FOR IV PUSH (FOR BLOOD PRESSURE SUPPORT)
PREFILLED_SYRINGE | INTRAVENOUS | Status: DC | PRN
  Administered 2018-02-18 (×3): 80 ug via INTRAVENOUS

## 2018-02-18 MED ORDER — SUGAMMADEX SODIUM 200 MG/2ML IV SOLN
INTRAVENOUS | Status: DC | PRN
  Administered 2018-02-18: 200 mg via INTRAVENOUS

## 2018-02-18 MED ORDER — INDOMETHACIN 50 MG RE SUPP
RECTAL | Status: AC
  Filled 2018-02-18: qty 2

## 2018-02-18 MED ORDER — DEXAMETHASONE SODIUM PHOSPHATE 10 MG/ML IJ SOLN
INTRAMUSCULAR | Status: DC | PRN
  Administered 2018-02-18: 4 mg via INTRAVENOUS

## 2018-02-18 MED ORDER — COLLAGENASE 250 UNIT/GM EX OINT
TOPICAL_OINTMENT | Freq: Every day | CUTANEOUS | Status: DC
  Administered 2018-02-18 – 2018-02-22 (×5): via TOPICAL
  Filled 2018-02-18: qty 90

## 2018-02-18 MED ORDER — GLUCAGON HCL RDNA (DIAGNOSTIC) 1 MG IJ SOLR
INTRAMUSCULAR | Status: AC
  Filled 2018-02-18: qty 1

## 2018-02-18 MED ORDER — LIDOCAINE 2% (20 MG/ML) 5 ML SYRINGE
INTRAMUSCULAR | Status: DC | PRN
  Administered 2018-02-18: 100 mg via INTRAVENOUS

## 2018-02-18 MED ORDER — LACTATED RINGERS IV SOLN
INTRAVENOUS | Status: DC | PRN
  Administered 2018-02-18 (×2): via INTRAVENOUS

## 2018-02-18 MED ORDER — SODIUM CHLORIDE 0.9 % IV SOLN
INTRAVENOUS | Status: DC | PRN
  Administered 2018-02-18: 50 ug/min via INTRAVENOUS

## 2018-02-18 MED ORDER — ADULT MULTIVITAMIN W/MINERALS CH
1.0000 | ORAL_TABLET | Freq: Every day | ORAL | Status: DC
  Administered 2018-02-19 – 2018-02-22 (×4): 1 via ORAL
  Filled 2018-02-18 (×4): qty 1

## 2018-02-18 NOTE — Interval H&P Note (Signed)
History and Physical Interval Note:  02/18/2018 10:39 AM  Autumn Bryant  has presented today for surgery, with the diagnosis of CBD stones, sepsis  The various methods of treatment have been discussed with the patient and family. After consideration of risks, benefits and other options for treatment, the patient has consented to  Procedure(s): ENDOSCOPIC RETROGRADE CHOLANGIOPANCREATOGRAPHY (ERCP) (N/A) as a surgical intervention .  The patient's history has been reviewed, patient examined, no change in status, stable for surgery.  I have reviewed the patient's chart and labs.  Questions were answered to the patient's satisfaction.     Milus Banister

## 2018-02-18 NOTE — Progress Notes (Signed)
Initial Nutrition Assessment  DOCUMENTATION CODES:   Not applicable  INTERVENTION:  - Will order Ensure Enlive po BID, each supplement provides 350 kcal and 20 grams of protein - Will order daily multivitamin with minerals. - Tech/RN to provide feeding assistance, if needed.  - Will monitor for additional nutrition-related needs.    NUTRITION DIAGNOSIS:   Increased nutrient needs related to wound healing as evidenced by estimated needs.  GOAL:   Patient will meet greater than or equal to 90% of their needs  MONITOR:   Diet advancement, PO intake, Supplement acceptance, Weight trends, Labs, Skin  REASON FOR ASSESSMENT:   Other (Comment)(PI report)  ASSESSMENT:   83 y.o. female with history of diastolic dysfunction, anxiety, depression, polyneuropathy, and cholecystectomy in 07/2017. She was brought to the ED with AMS and a fever 101.5 F.  BMI indicates normal weight. No intakes documented since admission. Flow sheet indicates that patient is a/o to self only. Talked with RN who reports patient with hx of dementia and that she has been confused but able to converse. No family/visitors present and RN reports none have been present today.   Patient denies abdominal pain or nausea but does report not feeling well today. She is otherwise unable to provide any nutrition-related information.   She had ERCP done earlier today and was NPO for the procedure.   Per chart review, current weight is 150 lb, but question accuracy of this weight as weight had previously been stable (155-160 lb) from 09/17/17-02/16/18.  Medications reviewed. Labs reviewed; CBGs: 104, 96, and 85 mg/dl today, Na: 147 mmol/l, K: 3.3 mmol/l, Ca: 8 mg/dl. IVF; NS @ 100 ml/hr.      NUTRITION - FOCUSED PHYSICAL EXAM:  Completed; no muscle and no fat wasting, moderate edema to BLE.  Diet Order:   Diet Order            Diet Heart Room service appropriate? Yes; Fluid consistency: Thin  Diet effective now               EDUCATION NEEDS:   No education needs have been identified at this time  Skin:  Skin Assessment: Skin Integrity Issues: Skin Integrity Issues:: Stage II, Stage III Stage II: back Stage III: sacrum  Last BM:  1/15  Height:   Ht Readings from Last 1 Encounters:  02/18/18 5\' 5"  (1.651 m)    Weight:   Wt Readings from Last 1 Encounters:  02/18/18 67.9 kg    Ideal Body Weight:  56.82 kg  BMI:  Body mass index is 24.91 kg/m.  Estimated Nutritional Needs:   Kcal:  1900-2100 kcal  Protein:  95-105 grams  Fluid:  >/= 1.9 L/day     Jarome Matin, MS, RD, LDN, [facility] Inpatient Clinical Dietitian Pager # [phone] After hours/weekend pager # [phone]

## 2018-02-18 NOTE — Anesthesia Postprocedure Evaluation (Signed)
Anesthesia Post Note  Patient: SAMAYA BOARDLEY  Procedure(s) Performed: ENDOSCOPIC RETROGRADE CHOLANGIOPANCREATOGRAPHY (ERCP) (N/A ) SPHINCTEROTOMY REMOVAL OF STONES     Patient location during evaluation: Endoscopy Anesthesia Type: General Level of consciousness: awake and alert Pain management: pain level controlled Vital Signs Assessment: post-procedure vital signs reviewed and stable Respiratory status: spontaneous breathing, nonlabored ventilation and respiratory function stable Cardiovascular status: blood pressure returned to baseline and stable Postop Assessment: no apparent nausea or vomiting Anesthetic complications: no    Last Vitals:  Vitals:   02/18/18 1220 02/18/18 1230  BP: (!) 154/65 137/68  Pulse: 71 75  Resp: 13 15  Temp:    SpO2: 97% 96%    Last Pain:  Vitals:   02/18/18 1213  TempSrc: Oral  PainSc:                  Lynda Rainwater

## 2018-02-18 NOTE — Transfer of Care (Signed)
Immediate Anesthesia Transfer of Care Note  Patient: Autumn Bryant  Procedure(s) Performed: ENDOSCOPIC RETROGRADE CHOLANGIOPANCREATOGRAPHY (ERCP) (N/A )  Patient Location: PACU and Endoscopy Unit  Anesthesia Type:General  Level of Consciousness: awake, alert  and confused  Airway & Oxygen Therapy: Patient Spontanous Breathing and Patient connected to face mask oxygen  Post-op Assessment: Report given to RN, Post -op Vital signs reviewed and stable and Patient moving all extremities  Post vital signs: Reviewed and stable  Last Vitals:  Vitals Value Taken Time  BP    Temp    Pulse    Resp    SpO2      Last Pain:  Vitals:   02/18/18 1007  TempSrc: Oral  PainSc: 6       Patients Stated Pain Goal: 2 (43/27/61 4709)  Complications: No apparent anesthesia complications

## 2018-02-18 NOTE — Anesthesia Preprocedure Evaluation (Signed)
Anesthesia Evaluation  Patient identified by MRN, date of birth, ID band Patient confused    Reviewed: Allergy & Precautions, NPO status , Patient's Chart, lab work & pertinent test results  Airway Mallampati: II  TM Distance: >3 FB Neck ROM: Full    Dental no notable dental hx. (+) Dental Advisory Given, Poor Dentition   Pulmonary neg pulmonary ROS,    Pulmonary exam normal breath sounds clear to auscultation       Cardiovascular hypertension, Normal cardiovascular exam Rhythm:Regular Rate:Normal  Impressions:  - Vigorous LV systolic function; mild diastolic dysfunction; mildly   elevated LVOT velocity likely related to vigorous LV function;   mild LAE; mild TR; possible catheter vs prominent eustachian   valve noted in RA.    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    GI/Hepatic negative GI ROS, Neg liver ROS,   Endo/Other  negative endocrine ROS  Renal/GU negative Renal ROS     Musculoskeletal negative musculoskeletal ROS (+)   Abdominal   Peds  Hematology negative hematology ROS (+)   Anesthesia Other Findings  Cholangitis  Reproductive/Obstetrics                             Anesthesia Physical  Anesthesia Plan  ASA: IV and emergent  Anesthesia Plan: General   Post-op Pain Management:    Induction: Intravenous  PONV Risk Score and Plan: 3 and Ondansetron, Dexamethasone, Treatment may vary due to age or medical condition and Midazolam  Airway Management Planned: Oral ETT  Additional Equipment:   Intra-op Plan:   Post-operative Plan: Extubation in OR  Informed Consent: I have reviewed the patients History and Physical, chart, labs and discussed the procedure including the risks, benefits and alternatives for the proposed anesthesia with the patient or authorized representative who has indicated his/her understanding and acceptance.     Dental advisory  given  Plan Discussed with: CRNA and Surgeon  Anesthesia Plan Comments:         Anesthesia Quick Evaluation

## 2018-02-18 NOTE — Plan of Care (Addendum)
  Problem: Clinical Measurements: Goal: Diagnostic test results will improve Outcome: Progressing   

## 2018-02-18 NOTE — Anesthesia Procedure Notes (Signed)
Procedure Name: Intubation Date/Time: 02/18/2018 11:05 AM Performed by: Eben Burow, CRNA Pre-anesthesia Checklist: Patient identified, Emergency Drugs available, Suction available, Patient being monitored and Timeout performed Patient Re-evaluated:Patient Re-evaluated prior to induction Oxygen Delivery Method: Circle system utilized Preoxygenation: Pre-oxygenation with 100% oxygen Induction Type: IV induction Ventilation: Mask ventilation without difficulty Laryngoscope Size: Mac and 4 Grade View: Grade I Tube type: Oral Number of attempts: 1 Airway Equipment and Method: Stylet Placement Confirmation: ETT inserted through vocal cords under direct vision,  positive ETCO2 and breath sounds checked- equal and bilateral Secured at: 21 cm Tube secured with: Tape Dental Injury: Teeth and Oropharynx as per pre-operative assessment

## 2018-02-18 NOTE — Progress Notes (Signed)
Pharmacy Antibiotic Note  Autumn Bryant is a 83 y.o. female admitted on 02/16/2018 with sepsis - suspected source is IAI or UTI.  Pharmacy has been consulted for Meropenem dosing d/t history of ESBL Ecoli UTI.  SCr 0.68, stable WBC decreased to 14.6 Afebrile Planning for emergent ERCP 1/15  Plan: Meropenem 1g IV q8h Follow up renal fxn, culture results, and clinical course. F/u ability to de-escalate antibiotics.   Height: 5\' 5"  (165.1 cm) Weight: 149 lb 11.1 oz (67.9 kg) IBW/kg (Calculated) : 57  Temp (24hrs), Avg:98 F (36.7 C), Min:97.9 F (36.6 C), Max:98.1 F (36.7 C)  Recent Labs  Lab 02/16/18 1900 02/16/18 1915 02/16/18 1926 02/16/18 2207 02/17/18 0325 02/17/18 0435 02/18/18 0308  WBC  --  18.1*  --   --   --  19.0* 14.6*  CREATININE 1.01*  --   --   --  0.68  0.77  --  0.68  LATICACIDVEN  --   --  2.94* 3.24* 1.2  --  1.8    Estimated Creatinine Clearance: 50.5 mL/min (by C-G formula based on SCr of 0.68 mg/dL).    Allergies  Allergen Reactions  . Demerol [Meperidine] Swelling    Antimicrobials this admission:  1/14 Vancomycin >> 1/15 1/14 Cefepime >> 1/14 1/14 Meropenem >> 1/14, resumed 1/15 >>  1/14 ceftriaxone >> 1/15  Dose adjustments this admission:    Microbiology results:  1/13 BCx: 3/4 bottle GNR, 1/4 bottles GPC      1/14 BCID: Ecoli and CoNS 1/15 MRSA PCR: neg  Previous results:  12/25/16 UCx:  ESBL Ecoli (sens: imi, gent, ntf)  Thank you for allowing pharmacy to be a part of this patient's care.  Gretta Arab PharmD, BCPS Pager [phone] 02/18/2018 8:13 AM

## 2018-02-18 NOTE — Progress Notes (Signed)
We tried contacting her son (which is the only family contact phone number that is available in the Epic system) three times yesterday. I called him again this morning and left a VM explaining the urgency of her situation and asking for immediate call back so we could get informed consent.    The hosp team is looking through a vast amount of paperwork that came from her NH for other contact information. I called her nursing home. They have the same phone number listed for her son and told me that he is very difficult to get in touch with for them as well. They explained she has a sister who is often helpful unfortunately they do not have her contact number.    She is listed as a 'full code.' If we are unable to get in touch with DPOA or anyone else from her family then I plan to authorize this morning's ERCP as an 'emergency consent' situation. She has cholangitis  (admitted with WBC 19K) from significant bile duct stone burden, without ERCP she will very possibly die.

## 2018-02-18 NOTE — Consult Note (Signed)
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 Sepsis from bile duct stones Wound type:pressure Pressure Injury POA: Yes Measurement: sacrum:  3 cm x 2.5 cm x 0.3 cm pale pink wound bed.  Mid back:  2 cm x 1 cm x 0.2 cm with thin fibrin slough to wound bed.  Wound bed:see above Drainage (amount, consistency, odor) minimal serosanguinous Periwound:intact Dressing procedure/placement/frequency: Cleanse wounds to sacrum and mid back with NS.  Apply Santyl to wound bed.  Cover with NS moist gauze.  Secure with silicone foam dressing. Change daily.  Will not follow at this time.  Please re-consult if needed.  Domenic Moras MSN, RN, FNP-BC CWON Wound, Ostomy, Continence Nurse Pager [phone]

## 2018-02-18 NOTE — Progress Notes (Signed)
PROGRESS NOTE    ELMYRA BANWART  PYP:[id number] DOB: 25-Mar-1935 DOA: 02/16/2018 PCP: [name], MD    Brief Narrative:  83 year old female who presented with altered mental status.  She does have significant past medical history for diastolic heart failure, anxiety, depression and polyneuropathy.  Patient was brought to the hospital due to altered mentation, abdominal pain and fever.  Unable to get further detailed history due to patient's confusion.  On her initial physical examination her blood pressure was 88/64, heart rate 94, respiratory rate 14, oxygen saturation 97%.  She was confused and disoriented, her lungs were clear to auscultation, heart S1-S2 present rhythmic, her abdomen was soft nontender, no lower extremity edema.  Sodium 148, potassium 2.8, chloride 103, bicarb 32, glucose 131, BUN 20, creatinine 1.0, alkaline phosphatase 533, AST 168, ALT 81, total bilirubin is 3.4, troponin I 0.04, lactic acid 2.9, 3.2.  Ammonia 25.  White cell count 18.1, hemoglobin 12.5, hematocrit 42.8, platelets 265.  INR 1.4.  Urinalysis more than 50 white cells, protein 30.  CT with intrahepatic biliary duct dilatation with multiple stones within the common bile duct.  Notable pulmonary nodules.  Chest radiograph with no infiltrates.  EKG with normal sinus rhythm, left axis deviation, poor R wave progression.  Patient was admitted to the hospital working diagnosis of sepsis, likely related to common bile duct obstruction.   Assessment & Plan:   Principal Problem:   Sepsis ([place]) Active Problems:   Urinary tract infection due to extended-spectrum beta lactamase (ESBL) producing Escherichia coli   Polyneuropathy   Bile duct stone  1. Sepsis due to common biliary duct obstruction (present on admission). Positive blood culture for E coli and staph MRSA. Initially on empiric ceftriaxone and Vancomycin. Now on meropenem 1/15. GI consulted and pt now s/p ERCP with findings of extensive  choledocholithiasis with purulence. GI rec to continue IV abx. ERCP was performed emergently given concerns of cholelithiasis and if untreated, may result in pt's death  2. Hypokalemia. Labs reviewed. K low at 3.2. Will replace. Repeat bmet in AM  3. Depression. At home on escitalopram, mirtazapine, and venlafaxine, continue on hold for now due to encephalopathy. Stable  4. Metabolic encephalopathy. Noted to be more alert than when initially admitted. Likely result of above cholelithasis   DVT prophylaxis: SCD's Code Status: Full Family Communication: Pt in room, family not present Disposition Plan: Uncertain at this time  Consultants:   GI  Procedures:   ERCP 1/15  Antimicrobials: Anti-infectives (From admission, onward)   Start     Dose/Rate Route Frequency Ordered Stop   02/18/18 0815  meropenem (MERREM) 1 g in sodium chloride 0.9 % 100 mL IVPB     1 g 200 mL/hr over 30 Minutes Intravenous Every 8 hours 02/18/18 0812     02/17/18 1800  cefTRIAXone (ROCEPHIN) 2 g in sodium chloride 0.9 % 100 mL IVPB  Status:  Discontinued     2 g 200 mL/hr over 30 Minutes Intravenous Every 24 hours 02/17/18 1559 02/18/18 0811   02/17/18 1000  vancomycin (VANCOCIN) 1,500 mg in sodium chloride 0.9 % 500 mL IVPB  Status:  Discontinued     1,500 mg 250 mL/hr over 120 Minutes Intravenous Every 24 hours 02/17/18 0524 02/18/18 0811   02/17/18 0800  ceFEPIme (MAXIPIME) 2 g in sodium chloride 0.9 % 100 mL IVPB  Status:  Discontinued     2 g 200 mL/hr over 30 Minutes Intravenous 2 times daily 02/17/18 0514 02/17/18 0657  02/17/18 0800  meropenem (MERREM) 1 g in sodium chloride 0.9 % 100 mL IVPB  Status:  Discontinued     1 g 200 mL/hr over 30 Minutes Intravenous Every 8 hours 02/17/18 0741 02/17/18 1559   02/16/18 1930  ceFEPIme (MAXIPIME) 2 g in sodium chloride 0.9 % 100 mL IVPB     2 g 200 mL/hr over 30 Minutes Intravenous  Once 02/16/18 1916 02/16/18 2017   02/16/18 1930  metroNIDAZOLE  (FLAGYL) IVPB 500 mg  Status:  Discontinued     500 mg 100 mL/hr over 60 Minutes Intravenous Every 8 hours 02/16/18 1916 02/17/18 0657   02/16/18 1930  vancomycin (VANCOCIN) IVPB 1000 mg/200 mL premix     1,000 mg 200 mL/hr over 60 Minutes Intravenous  Once 02/16/18 1916 02/16/18 2201       Subjective: Complains of conitnued abd pain  Objective: Vitals:   02/18/18 1213 02/18/18 1215 02/18/18 1220 02/18/18 1230  BP: (!) 146/57 (!) 151/77 (!) 154/65 137/68  Pulse: 70 70 71 75  Resp: 10 11 13 15   Temp: 97.6 F (36.4 C)     TempSrc: Oral     SpO2: 100% 99% 97% 96%  Weight:      Height:        Intake/Output Summary (Last 24 hours) at 02/18/2018 1646 Last data filed at 02/18/2018 1213 Gross per 24 hour  Intake 3080.32 ml  Output 0 ml  Net 3080.32 ml   Filed Weights   02/18/18 0009 02/18/18 0417  Weight: 67.9 kg 67.9 kg    Examination:  General exam: Appears calm and comfortable  Respiratory system: Clear to auscultation. Respiratory effort normal. Cardiovascular system: S1 & S2 heard, RRR Gastrointestinal system: Abdomen is nondistended, pos bs, generally tender Central nervous system: Alert and oriented. No focal neurological deficits. Extremities: Symmetric 5 x 5 power. Skin: No rashes, lesions  Psychiatry: Judgement and insight appear normal. Mood & affect appropriate.   Data Reviewed: I have personally reviewed following labs and imaging studies  CBC: Recent Labs  Lab 02/16/18 1915 02/17/18 0435 02/18/18 0308  WBC 18.1* 19.0* 14.6*  NEUTROABS 16.8* 16.7* 13.2*  HGB 12.5 10.1* 12.1  HCT 42.8 34.9* 40.8  MCV 89.5 90.6 87.9  PLT 265 193 073*   Basic Metabolic Panel: Recent Labs  Lab 02/16/18 1900 02/16/18 2227 02/17/18 0325 02/18/18 0308  NA 148*  --  144 147*  K 2.8*  --  3.4* 3.2*  CL 103  --  108 109  CO2 32  --  28 27  GLUCOSE 131*  --  145* 108*  BUN 20  --  17 14  CREATININE 1.01*  --  0.68  0.77 0.68  CALCIUM 8.4*  --  7.6* 8.0*  MG  --   2.1  --   --    GFR: Estimated Creatinine Clearance: 50.5 mL/min (by C-G formula based on SCr of 0.68 mg/dL). Liver Function Tests: Recent Labs  Lab 02/16/18 1900 02/17/18 0325  AST 168* 142*  ALT 81* 70*  ALKPHOS 533* 373*  BILITOT 3.4* 3.4*  PROT 7.0 5.4*  ALBUMIN 2.8* 2.0*   Recent Labs  Lab 02/17/18 0325  LIPASE 21   Recent Labs  Lab 02/17/18 0435  AMMONIA 25   Coagulation Profile: Recent Labs  Lab 02/17/18 0325  INR 1.42   Cardiac Enzymes: Recent Labs  Lab 02/17/18 0325  TROPONINI 0.04*   BNP (last 3 results) No results for input(s): PROBNP in the last 8760 hours. HbA1C:  No results for input(s): HGBA1C in the last 72 hours. CBG: Recent Labs  Lab 02/17/18 1805 02/17/18 1826 02/18/18 0020 02/18/18 0558 02/18/18 1251  GLUCAP 79 61* 104* 96 85   Lipid Profile: No results for input(s): CHOL, HDL, LDLCALC, TRIG, CHOLHDL, LDLDIRECT in the last 72 hours. Thyroid Function Tests: No results for input(s): TSH, T4TOTAL, FREET4, T3FREE, THYROIDAB in the last 72 hours. Anemia Panel: No results for input(s): VITAMINB12, FOLATE, FERRITIN, TIBC, IRON, RETICCTPCT in the last 72 hours. Sepsis Labs: Recent Labs  Lab 02/16/18 1926 02/16/18 2207 02/17/18 0325 02/18/18 0308  PROCALCITON  --   --  23.47  --   LATICACIDVEN 2.94* 3.24* 1.2 1.8    Recent Results (from the past 240 hour(s))  Blood Culture (routine x 2)     Status: Abnormal (Preliminary result)   Collection Time: 02/16/18  7:15 PM  Result Value Ref Range Status   Specimen Description   Final    LEFT ANTECUBITAL Performed at [facility] [address], [place], [place] [address]    Special Requests   Final    BOTTLES DRAWN AEROBIC AND ANAEROBIC Blood Culture adequate volume Performed at [facility] [address], [place], [place] [address]    Culture  Setup Time   Final    GRAM NEGATIVE RODS IN BOTH AEROBIC AND ANAEROBIC BOTTLES GRAM POSITIVE COCCI  AEROBIC BOTTLE ONLY CRITICAL RESULT CALLED TO, READ BACK BY AND VERIFIED WITH: Sheffield Slider PharmD 11:15 02/17/18 ([email or web address])    Culture (A)  Final    ESCHERICHIA COLI SUSCEPTIBILITIES TO FOLLOW Performed at [facility], [place] [address] [place], [place]  [address]    Report Status PENDING  Incomplete  Blood Culture ID Panel (Reflexed)     Status: Abnormal   Collection Time: 02/16/18  7:15 PM  Result Value Ref Range Status   Enterococcus species NOT DETECTED NOT DETECTED Final   Listeria monocytogenes NOT DETECTED NOT DETECTED Final   Staphylococcus species DETECTED (A) NOT DETECTED Final    Comment: Methicillin (oxacillin) resistant coagulase negative staphylococcus. Possible blood culture contaminant (unless isolated from more than one blood culture draw or clinical case suggests pathogenicity). No antibiotic treatment is indicated for blood  culture contaminants. CRITICAL RESULT CALLED TO, READ BACK BY AND VERIFIED WITH: Sheffield Slider PharmD 11:15 02/17/18 ([email or web address])    Staphylococcus aureus (BCID) NOT DETECTED NOT DETECTED Final   Methicillin resistance DETECTED (A) NOT DETECTED Final    Comment: CRITICAL RESULT CALLED TO, READ BACK BY AND VERIFIED WITH: Sheffield Slider PharmD 11:15 02/17/18 ([email or web address])    Streptococcus species NOT DETECTED NOT DETECTED Final   Streptococcus agalactiae NOT DETECTED NOT DETECTED Final   Streptococcus pneumoniae NOT DETECTED NOT DETECTED Final   Streptococcus pyogenes NOT DETECTED NOT DETECTED Final   Acinetobacter baumannii NOT DETECTED NOT DETECTED Final   Enterobacteriaceae species DETECTED (A) NOT DETECTED Final    Comment: Enterobacteriaceae represent a large family of gram-negative bacteria, not a single organism. CRITICAL RESULT CALLED TO, READ BACK BY AND VERIFIED WITH: Sheffield Slider PharmD 11:15 02/17/18 ([email or web address])    Enterobacter cloacae complex NOT DETECTED NOT DETECTED Final   Escherichia coli DETECTED (A) NOT DETECTED Final    Comment:  CRITICAL RESULT CALLED TO, READ BACK BY AND VERIFIED WITH: Sheffield Slider PharmD 11:15 02/17/18 ([email or web address])    Klebsiella oxytoca NOT DETECTED NOT DETECTED Final   Klebsiella pneumoniae NOT DETECTED NOT DETECTED Final   Proteus species NOT DETECTED NOT DETECTED Final   Serratia  marcescens NOT DETECTED NOT DETECTED Final   Carbapenem resistance NOT DETECTED NOT DETECTED Final   Haemophilus influenzae NOT DETECTED NOT DETECTED Final   Neisseria meningitidis NOT DETECTED NOT DETECTED Final   Pseudomonas aeruginosa NOT DETECTED NOT DETECTED Final   Candida albicans NOT DETECTED NOT DETECTED Final   Candida glabrata NOT DETECTED NOT DETECTED Final   Candida krusei NOT DETECTED NOT DETECTED Final   Candida parapsilosis NOT DETECTED NOT DETECTED Final   Candida tropicalis NOT DETECTED NOT DETECTED Final    Comment: Performed at [facility], [place] [address], [place], [place] [address]  Blood Culture (routine x 2)     Status: None (Preliminary result)   Collection Time: 02/16/18  7:20 PM  Result Value Ref Range Status   Specimen Description   Final    BLOOD LEFT ANTECUBITAL Performed at [facility] [address], [place], [place] [address]    Special Requests   Final    BOTTLES DRAWN AEROBIC AND ANAEROBIC Blood Culture adequate volume Performed at [facility] [address], [place], [place] [address]    Culture  Setup Time   Final    GRAM NEGATIVE RODS AEROBIC BOTTLE ONLY CRITICAL VALUE NOTED.  VALUE IS CONSISTENT WITH PREVIOUSLY REPORTED AND CALLED VALUE.    Culture   Final    NO GROWTH 1 DAY Performed at [facility], [place] [address] [place], [place] [address]    Report Status PENDING  Incomplete  MRSA PCR Screening     Status: None   Collection Time: 02/18/18 12:14 AM  Result Value Ref Range Status   MRSA by PCR NEGATIVE NEGATIVE Final    Comment:        The GeneXpert MRSA Assay (FDA approved for NASAL specimens only), is one  component of a comprehensive MRSA colonization surveillance program. It is not intended to diagnose MRSA infection nor to guide or monitor treatment for MRSA infections. Performed at [facility], [place] [address], [place], [place] [address]      Radiology Studies: Ct Head Wo Contrast  Result Date: 02/17/2018 CLINICAL DATA:  Fever and altered mental status for 1 day. EXAM: CT HEAD WITHOUT CONTRAST TECHNIQUE: Contiguous axial images were obtained from the base of the skull through the vertex without intravenous contrast. COMPARISON:  10/18/2017 FINDINGS: Brain: Patient positioning limits examination. Area of encephalomalacia demonstrated in the right frontal lobe without change since prior study. Mild diffuse cerebral atrophy. Ventricular dilatation, partially related to encephalomalacia. Low-attenuation changes in the deep white matter likely represent small vessel ischemia. No change in appearance of intracranial contents since previous study. No acute mass effect or abnormal extra-axial fluid collections. No acute intracranial hemorrhage. Vascular: Intracranial arterial vascular calcifications are present in the vertebrobasilar and carotid arteries. Skull: Focal cranial ostomy in the right anterior frontal region. No acute depressed skull fractures identified. Sinuses/Orbits: Opacification of a posterior right ethmoid air cells. Paranasal sinuses are otherwise clear. Mastoid air cells are clear. No change since prior study. Other: None. IMPRESSION: No acute intracranial abnormalities. Chronic atrophy and small vessel ischemic changes. Area of old encephalomalacia in the right frontal lobe unchanged. Electronically Signed   By: Lucienne Capers M.D.   On: 02/17/2018 03:06   US Abdomen Limited  Result Date: 02/16/2018 CLINICAL DATA:  83 y/o F; abdominal pain and abnormal liver function test. EXAM: ULTRASOUND ABDOMEN LIMITED RIGHT UPPER QUADRANT COMPARISON:  None. FINDINGS:  Gallbladder: Not identified. Common bile duct: Diameter:  7 mm Liver: No focal lesion identified. Within normal limits in parenchymal echogenicity. Portal vein is patent on color Doppler imaging with normal direction of blood flow towards the liver. IMPRESSION: Gallbladder not identified. Otherwise unremarkable right upper quadrant ultrasound. Electronically Signed   By: Kristine Garbe M.D.   On: 02/16/2018 22:45   Dg Chest Port 1 View  Result Date: 02/16/2018 CLINICAL DATA:  Fever EXAM: PORTABLE CHEST 1 VIEW COMPARISON:  July 19, 2017 FINDINGS: There is mild scarring in the left lower lobe. There is no edema or consolidation. Heart size and pulmonary vascularity are normal. No adenopathy. There is aortic atherosclerosis. Bones are osteoporotic. There are postoperative changes in the right breast region. There is evidence of an old fracture of the right proximal humerus with remodeling in this area. There is chronic anterior dislocation of the right shoulder. IMPRESSION: Scarring left base region. No edema or consolidation. Stable cardiac silhouette. There is aortic atherosclerosis. Bones osteoporotic. Chronic anterior dislocation right shoulder with evidence of old healed fracture right proximal humerus with remodeling. Postoperative change right breast region. Aortic Atherosclerosis (ICD10-I70.0). Electronically Signed   By: Lowella Grip III M.D.   On: 02/16/2018 19:42   Dg Ercp Biliary & Pancreatic Ducts  Result Date: 02/18/2018 CLINICAL DATA:  Common bile duct stone. Evaluate patency of the CBD. EXAM: ERCP TECHNIQUE: Multiple spot images obtained with the fluoroscopic device and submitted for interpretation post-procedure. COMPARISON:  CT abdomen pelvis-02/17/2018 FINDINGS: Six spot intraoperative fluoroscopic images of the right upper abdominal quadrant during ERCP are provided for review Initial image demonstrates an ERCP probe overlying the right upper abdominal quadrant. There is  suspected cannulation of the common bile duct. Cholecystectomy clips overlies expected location of the gallbladder fossa. Subsequent images demonstrate selective cannulation and opacification of the common bile duct. There are multiple nonocclusive filling defects seen within the CBD worrisome for choledocholithiasis as was suggested on preceding abdominal CT. Subsequent images demonstrate insufflation of a balloon within the CBD with subsequent biliary sweeping and presumed sphincterotomy. IMPRESSION: ERCP with findings suggestive of choledocholithiasis with subsequent presumed biliary sweeping and sphincterotomy. These images were submitted for radiologic interpretation only. Please see the procedural report for the amount of contrast and the fluoroscopy time utilized. Electronically Signed   By: Sandi Mariscal M.D.   On: 02/18/2018 12:05   Ct Renal Stone Study  Result Date: 02/17/2018 CLINICAL DATA:  82 y/o  F; fever and flank pain. EXAM: CT ABDOMEN AND PELVIS WITHOUT CONTRAST TECHNIQUE: Multidetector CT imaging of the abdomen and pelvis was performed following the standard protocol without IV contrast. COMPARISON:  None. FINDINGS: Lower chest: Small bilateral pleural effusions. Ill-defined bilateral dependent lower lobe opacities may represent atelectasis or pneumonia. 5 mm nodules in the right middle and lower lobes (series 3, image 2 and 4) and 7 mm nodule in the right middle lobe (series 3, image 11). Hepatobiliary: Diffuse intra biliary ductal dilatation. Multiple biliary stones within the common bile duct (series 4, image 51). Cholecystectomy. Pancreas: Unremarkable. No pancreatic ductal dilatation or surrounding inflammatory changes. Spleen: Normal in size without focal abnormality. Adrenals/Urinary Tract: Normal adrenal glands. Normal right kidney and ureter. 6 mm stone in the proximal left ureter with mild left pelvicaliectasis (series 2, image 32). The downstream ureter is also mildly distended,  however, no radiopaque stone is identified. Multiple bladder diverticula. Stomach/Bowel: Stomach is within normal limits. Appendix appears normal. No evidence of bowel wall thickening, distention, or inflammatory changes. Vascular/Lymphatic: Aortic atherosclerosis. No enlarged abdominal or pelvic lymph  nodes. Reproductive: Uterus and bilateral adnexa are unremarkable. Other: No abdominal wall hernia or abnormality. No abdominopelvic ascites. Musculoskeletal: Severe T12, mild L3, mild L4 compression deformities. L4-5 grade 1 anterolisthesis. Chronic right proximal femur fracture intramedullary nail and proximal lag screw. 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 IMPRESSION: 1. Intrahepatic biliary ductal dilatation and multiple stones within the common bile duct. 2. Multiple pulmonary nodules measuring up to 7 mm. Non-contrast chest CT at 3-6 months is recommended. If the nodules are stable at time of repeat CT, then future CT at 18-24 months (from today's scan) is considered optional for low-risk patients, but is recommended for high-risk patients. This recommendation follows the consensus statement: Guidelines for Management of Incidental Pulmonary Nodules Detected on CT Images: From the Fleischner Society 2017; Radiology 2017; 284:228-243. 3. Small bilateral pleural effusions and small dependent lower lobe opacities which may represent associated atelectasis or pneumonia. 4. 6 mm stone in left proximal ureter with mild proximal pelvicaliectasis. 5. Chronic appearing T12, L3, L4 compression deformities. Electronically Signed   By: Kristine Garbe M.D.   On: 02/17/2018 03:11    Scheduled Meds: . collagenase   Topical Daily  . feeding supplement (ENSURE ENLIVE)  237 mL Oral BID BM  . latanoprost  1 drop Both Eyes QHS  . multivitamin with minerals  1 tablet Oral Daily   Continuous Infusions: . sodium chloride 100 mL/hr at 02/18/18  0927  . meropenem (MERREM) IV Stopped (02/18/18 7262)     LOS: 1 day   Marylu Lund, MD [facility] Pager On Amion  If 7PM-7AM, please contact night-coverage 02/18/2018, 4:46 PM

## 2018-02-18 NOTE — Op Note (Addendum)
[facility] Patient Name: Autumn Bryant Procedure Date: 02/18/2018 MRN: [id number] Attending MD: Milus Banister , MD Date of Birth: 06-Jul-1935 CSN: [id number] Age: 83 Admit Type: Inpatient Procedure:                ERCP Indications:              Bile duct stone(s) Providers:                Milus Banister, MD, Cleda Daub, RN, Janie                            Billups, Technician, Courtney Heys Armistead, CRNA Referring MD:              Medicines:                General Anesthesia, Indomethacin 875 mg PR Complications:            No immediate complications. Estimated blood loss:                            None Estimated Blood Loss:     Estimated blood loss: none. Procedure:                Pre-Anesthesia Assessment:                           - Prior to the procedure, a History and Physical                            was performed, and patient medications and                            allergies were reviewed. The patient's tolerance of                            previous anesthesia was also reviewed. Emergency                            consent agreed on, I was unable to reach her                            family, DPOA (see full documentation in EPIC).                            Prior Anticoagulants: The patient has taken no                            previous anticoagulant or antiplatelet agents. ASA                            Grade Assessment: IV - A patient with severe                            systemic disease that is a constant threat to life.  After reviewing the risks and benefits, the patient                            was deemed in satisfactory condition to undergo the                            procedure.                           Throughout the procedure, the patient's blood                            pressure, pulse, and oxygen saturations were                            monitored continuously. The TJF-Q180V ([id number])                             Olympus duodenoscope was introduced through the                            mouth, and used to inject contrast into and used to                            inject contrast into the bile duct. The ERCP was                            accomplished without difficulty. The patient                            tolerated the procedure well. Scope In: Scope Out: Findings:      A scout film of the abdomen was obtained. Surgical clips, consistent       with a previous cholecystectomy, were seen in the area of the right       upper quadrant of the abdomen. The esophagus was successfully intubated       under direct vision. The scope was advanced to a bulging major papilla       in the descending duodenum without detailed examination of the pharynx,       larynx and associated structures, and upper GI tract. A 44 Autotome over       a .035 hydrawire was used to cannulate the bile duct and dye was       injected. Cholangiogram revealed numerous medium to large geometric       filling defects with the CBD, hepatic ducts consistent with stones. An       adequate biliary sphincterotomy was created over the wire and then the       bile duct was swept several times (from right and left hepatic ducts       down to the papilla. This delivered 7-8 brown gallstones, extensive       biliary debris and obvious purulence into the duodenum. An occlusion       cholangiogram showed no remaining filling defects and the scope was       removed from the patient. The pancreatic duct was never cannulated with  the wire or injected with dye. Impression:               - Extensive choledocholithiasis was found and                            treated with biliary sphincterotomy and balloon                            sweeping.                           - Purulent cholangitis noted Moderate Sedation:      Not Applicable - Patient had care per Anesthesia. Recommendation:           - Return patient to hospital  ward for ongoing care.                           - Continue IV antibiotics for now. Procedure Code(s):        --- Professional ---                           [phone], Endoscopic retrograde                            cholangiopancreatography (ERCP); with removal of                            calculi/debris from biliary/pancreatic duct(s)                           [id number], Endoscopic retrograde                            cholangiopancreatography (ERCP); with                            sphincterotomy/papillotomy Diagnosis Code(s):        --- Professional ---                           K80.50, Calculus of bile duct without cholangitis                            or cholecystitis without obstruction CPT copyright 2018 American Medical Association. All rights reserved. The codes documented in this report are preliminary and upon coder review may  be revised to meet current compliance requirements. Milus Banister, MD 02/18/2018 12:02:08 PM This report has been signed electronically. Number of Addenda: 0

## 2018-02-19 LAB — COMPREHENSIVE METABOLIC PANEL
ALK PHOS: 299 U/L — AB (ref 38–126)
ALT: 56 U/L — AB (ref 0–44)
AST: 71 U/L — AB (ref 15–41)
Albumin: 1.9 g/dL — ABNORMAL LOW (ref 3.5–5.0)
Anion gap: 6 (ref 5–15)
BUN: 12 mg/dL (ref 8–23)
CALCIUM: 7.7 mg/dL — AB (ref 8.9–10.3)
CO2: 24 mmol/L (ref 22–32)
Chloride: 115 mmol/L — ABNORMAL HIGH (ref 98–111)
Creatinine, Ser: 0.54 mg/dL (ref 0.44–1.00)
GFR calc Af Amer: >60 mL/min (ref >60)
GFR calc non Af Amer: >60 mL/min (ref >60)
Glucose, Bld: 96 mg/dL (ref 70–99)
Potassium: 3.3 mmol/L — ABNORMAL LOW (ref 3.5–5.1)
Sodium: 145 mmol/L (ref 135–145)
Total Bilirubin: 2.2 mg/dL — ABNORMAL HIGH (ref 0.3–1.2)
Total Protein: 5 g/dL — ABNORMAL LOW (ref 6.5–8.1)

## 2018-02-19 LAB — GLUCOSE, CAPILLARY
Glucose-Capillary: 84 mg/dL (ref 70–99)
Glucose-Capillary: 95 mg/dL (ref 70–99)
Glucose-Capillary: 82 mg/dL (ref 70–99)

## 2018-02-19 LAB — CBC
HCT: 35.6 % — ABNORMAL LOW (ref 36.0–46.0)
Hemoglobin: 10.3 g/dL — ABNORMAL LOW (ref 12.0–15.0)
MCH: 25.9 pg — AB (ref 26.0–34.0)
MCHC: 28.9 g/dL — ABNORMAL LOW (ref 30.0–36.0)
MCV: 89.7 fL (ref 80.0–100.0)
Platelets: 197 10*3/uL (ref 150–400)
RBC: 3.97 MIL/uL (ref 3.87–5.11)
RDW: 16.5 % — ABNORMAL HIGH (ref 11.5–15.5)
WBC: 9.9 10*3/uL (ref 4.0–10.5)
nRBC: 0 % (ref 0.0–0.2)

## 2018-02-19 LAB — CULTURE, BLOOD (ROUTINE X 2): Special Requests: ADEQUATE

## 2018-02-19 MED ORDER — SODIUM CHLORIDE 0.9 % IV SOLN
INTRAVENOUS | Status: DC
  Administered 2018-02-19 – 2018-02-21 (×3): via INTRAVENOUS

## 2018-02-19 MED ORDER — POTASSIUM CHLORIDE CRYS ER 20 MEQ PO TBCR
40.0000 meq | EXTENDED_RELEASE_TABLET | Freq: Once | ORAL | Status: AC
  Administered 2018-02-19: 40 meq via ORAL
  Filled 2018-02-19: qty 2

## 2018-02-19 NOTE — Progress Notes (Signed)
PROGRESS NOTE    Autumn Bryant  ZOX:[id number] DOB: [birth year] DOA: 02/16/2018 PCP: [name], MD    Brief Narrative:  83 year old female who presented with altered mental status.  She does have significant past medical history for diastolic heart failure, anxiety, depression and polyneuropathy.  Patient was brought to the hospital due to altered mentation, abdominal pain and fever.  Unable to get further detailed history due to patient's confusion.  On her initial physical examination her blood pressure was 88/64, heart rate 94, respiratory rate 14, oxygen saturation 97%.  She was confused and disoriented, her lungs were clear to auscultation, heart S1-S2 present rhythmic, her abdomen was soft nontender, no lower extremity edema.  Sodium 148, potassium 2.8, chloride 103, bicarb 32, glucose 131, BUN 20, creatinine 1.0, alkaline phosphatase 533, AST 168, ALT 81, total bilirubin is 3.4, troponin I 0.04, lactic acid 2.9, 3.2.  Ammonia 25.  White cell count 18.1, hemoglobin 12.5, hematocrit 42.8, platelets 265.  INR 1.4.  Urinalysis more than 50 white cells, protein 30.  CT with intrahepatic biliary duct dilatation with multiple stones within the common bile duct.  Notable pulmonary nodules.  Chest radiograph with no infiltrates.  EKG with normal sinus rhythm, left axis deviation, poor R wave progression.  Patient was admitted to the hospital working diagnosis of sepsis, likely related to common bile duct obstruction.   Assessment & Plan:   Principal Problem:   Sepsis ([place]) Active Problems:   Urinary tract infection due to extended-spectrum beta lactamase (ESBL) producing Escherichia coli   Polyneuropathy   Bile duct stone  1. Sepsis due to common biliary duct obstruction (present on admission). Positive blood culture for E coli and staph MRSA. Initially on empiric ceftriaxone and Vancomycin. Now on meropenem 1/15. GI consulted and pt now s/p ERCP with findings of extensive  choledocholithiasis with purulence. GI rec to continue IV abx. ERCP was performed emergently given concerns of cholelithiasis and if untreated, may result in pt's death -States abd pain improved. Anticipate transition to PO abx at time of d/c  2. Hypokalemia. Labs reviewed. K remains low at 3.3. Replace today and repeat bmet in AM  3. Depression. At home on escitalopram, mirtazapine, and venlafaxine, continue on hold for now due to encephalopathy. Stable  4. Metabolic encephalopathy. Noted to be more alert than when initially admitted. Conversing appropriately   DVT prophylaxis: SCD's Code Status: Full Family Communication: Pt in room, family not present Disposition Plan: Uncertain at this time, pending PT/OT eval  Consultants:   GI  Procedures:   ERCP 1/15  Antimicrobials: Anti-infectives (From admission, onward)   Start     Dose/Rate Route Frequency Ordered Stop   02/18/18 0815  meropenem (MERREM) 1 g in sodium chloride 0.9 % 100 mL IVPB     1 g 200 mL/hr over 30 Minutes Intravenous Every 8 hours 02/18/18 0812     02/17/18 1800  cefTRIAXone (ROCEPHIN) 2 g in sodium chloride 0.9 % 100 mL IVPB  Status:  Discontinued     2 g 200 mL/hr over 30 Minutes Intravenous Every 24 hours 02/17/18 1559 02/18/18 0811   02/17/18 1000  vancomycin (VANCOCIN) 1,500 mg in sodium chloride 0.9 % 500 mL IVPB  Status:  Discontinued     1,500 mg 250 mL/hr over 120 Minutes Intravenous Every 24 hours 02/17/18 0524 02/18/18 0811   02/17/18 0800  ceFEPIme (MAXIPIME) 2 g in sodium chloride 0.9 % 100 mL IVPB  Status:  Discontinued     2  g 200 mL/hr over 30 Minutes Intravenous 2 times daily 02/17/18 0514 02/17/18 0657   02/17/18 0800  meropenem (MERREM) 1 g in sodium chloride 0.9 % 100 mL IVPB  Status:  Discontinued     1 g 200 mL/hr over 30 Minutes Intravenous Every 8 hours 02/17/18 0741 02/17/18 1559   02/16/18 1930  ceFEPIme (MAXIPIME) 2 g in sodium chloride 0.9 % 100 mL IVPB     2 g 200 mL/hr  over 30 Minutes Intravenous  Once 02/16/18 1916 02/16/18 2017   02/16/18 1930  metroNIDAZOLE (FLAGYL) IVPB 500 mg  Status:  Discontinued     500 mg 100 mL/hr over 60 Minutes Intravenous Every 8 hours 02/16/18 1916 02/17/18 0657   02/16/18 1930  vancomycin (VANCOCIN) IVPB 1000 mg/200 mL premix     1,000 mg 200 mL/hr over 60 Minutes Intravenous  Once 02/16/18 1916 02/16/18 2201      Subjective: States abd pain improved today  Objective: Vitals:   02/19/18 0600 02/19/18 0700 02/19/18 0800 02/19/18 0828  BP: (!) 145/77   (!) 150/98  Pulse: 89 85 80 90  Resp: 15     Temp:      TempSrc:      SpO2: 99% 98% 99% 95%  Weight:      Height:        Intake/Output Summary (Last 24 hours) at 02/19/2018 0842 Last data filed at 02/19/2018 0400 Gross per 24 hour  Intake 2063.3 ml  Output 0 ml  Net 2063.3 ml   Filed Weights   02/18/18 0009 02/18/18 0417 02/19/18 0500  Weight: 67.9 kg 67.9 kg 70.6 kg    Examination: General exam: Awake, laying in bed, in nad Respiratory system: Normal respiratory effort, no wheezing Cardiovascular system: regular rate, s1, s2 Gastrointestinal system: Soft, nondistended, positive BS Central nervous system: CN2-12 grossly intact, strength intact Extremities: Perfused, no clubbing Skin: Normal skin turgor, no notable skin lesions seen Psychiatry: Mood normal // no visual hallucinations   Data Reviewed: I have personally reviewed following labs and imaging studies  CBC: Recent Labs  Lab 02/16/18 1915 02/17/18 0435 02/18/18 0308 02/19/18 0311  WBC 18.1* 19.0* 14.6* 9.9  NEUTROABS 16.8* 16.7* 13.2*  --   HGB 12.5 10.1* 12.1 10.3*  HCT 42.8 34.9* 40.8 35.6*  MCV 89.5 90.6 87.9 89.7  PLT 265 193 124* 811   Basic Metabolic Panel: Recent Labs  Lab 02/16/18 1900 02/16/18 2227 02/17/18 0325 02/18/18 0308 02/19/18 0311  NA 148*  --  144 147* 145  K 2.8*  --  3.4* 3.2* 3.3*  CL 103  --  108 109 115*  CO2 32  --  28 27 24   GLUCOSE 131*  --  145*  108* 96  BUN 20  --  17 14 12   CREATININE 1.01*  --  0.68  0.77 0.68 0.54  CALCIUM 8.4*  --  7.6* 8.0* 7.7*  MG  --  2.1  --   --   --    GFR: Estimated Creatinine Clearance: 55.3 mL/min (by C-G formula based on SCr of 0.54 mg/dL). Liver Function Tests: Recent Labs  Lab 02/16/18 1900 02/17/18 0325 02/19/18 0311  AST 168* 142* 71*  ALT 81* 70* 56*  ALKPHOS 533* 373* 299*  BILITOT 3.4* 3.4* 2.2*  PROT 7.0 5.4* 5.0*  ALBUMIN 2.8* 2.0* 1.9*   Recent Labs  Lab 02/17/18 0325  LIPASE 21   Recent Labs  Lab 02/17/18 0435  AMMONIA 25   Coagulation Profile: Recent  Labs  Lab 02/17/18 0325  INR 1.42   Cardiac Enzymes: Recent Labs  Lab 02/17/18 0325  TROPONINI 0.04*   BNP (last 3 results) No results for input(s): PROBNP in the last 8760 hours. HbA1C: No results for input(s): HGBA1C in the last 72 hours. CBG: Recent Labs  Lab 02/18/18 0558 02/18/18 1251 02/18/18 1645 02/18/18 2315 02/19/18 0529  GLUCAP 96 85 112* 104* 84   Lipid Profile: No results for input(s): CHOL, HDL, LDLCALC, TRIG, CHOLHDL, LDLDIRECT in the last 72 hours. Thyroid Function Tests: No results for input(s): TSH, T4TOTAL, FREET4, T3FREE, THYROIDAB in the last 72 hours. Anemia Panel: No results for input(s): VITAMINB12, FOLATE, FERRITIN, TIBC, IRON, RETICCTPCT in the last 72 hours. Sepsis Labs: Recent Labs  Lab 02/16/18 1926 02/16/18 2207 02/17/18 0325 02/18/18 0308  PROCALCITON  --   --  23.47  --   LATICACIDVEN 2.94* 3.24* 1.2 1.8    Recent Results (from the past 240 hour(s))  Blood Culture (routine x 2)     Status: Abnormal   Collection Time: 02/16/18  7:15 PM  Result Value Ref Range Status   Specimen Description   Final    LEFT ANTECUBITAL Performed at [facility] [address], [place], [place] [address]    Special Requests   Final    BOTTLES DRAWN AEROBIC AND ANAEROBIC Blood Culture adequate volume Performed at [facility] [place]  [address], [place], [place] [address]    Culture  Setup Time   Final    GRAM NEGATIVE RODS IN BOTH AEROBIC AND ANAEROBIC BOTTLES GRAM POSITIVE COCCI AEROBIC BOTTLE ONLY CRITICAL RESULT CALLED TO, READ BACK BY AND VERIFIED WITH: Sheffield Slider PharmD 11:15 02/17/18 ([email or web address])    Culture (A)  Final    ESCHERICHIA COLI Confirmed Extended Spectrum Beta-Lactamase Producer (ESBL).  In bloodstream infections from ESBL organisms, carbapenems are preferred over piperacillin/tazobactam. They are shown to have a lower risk of mortality. STAPHYLOCOCCUS SPECIES (COAGULASE NEGATIVE) THE SIGNIFICANCE OF ISOLATING THIS ORGANISM FROM A SINGLE SET OF BLOOD CULTURES WHEN MULTIPLE SETS ARE DRAWN IS UNCERTAIN. PLEASE NOTIFY THE MICROBIOLOGY DEPARTMENT WITHIN ONE WEEK IF SPECIATION AND SENSITIVITIES ARE REQUIRED. Performed at [facility], [place] [address] [place], [place] [address]    Report Status 02/19/2018 FINAL  Final   Organism ID, Bacteria ESCHERICHIA COLI  Final      Susceptibility   Escherichia coli - MIC*    AMPICILLIN >=32 RESISTANT Resistant     CEFAZOLIN >=64 RESISTANT Resistant     CEFEPIME RESISTANT Resistant     CEFTAZIDIME >=64 RESISTANT Resistant     CEFTRIAXONE >=64 RESISTANT Resistant     CIPROFLOXACIN >=4 RESISTANT Resistant     GENTAMICIN <=1 SENSITIVE Sensitive     IMIPENEM <=0.25 SENSITIVE Sensitive     TRIMETH/SULFA >=320 RESISTANT Resistant     AMPICILLIN/SULBACTAM >=32 RESISTANT Resistant     PIP/TAZO 8 SENSITIVE Sensitive     Extended ESBL POSITIVE Resistant     * ESCHERICHIA COLI  Blood Culture ID Panel (Reflexed)     Status: Abnormal   Collection Time: 02/16/18  7:15 PM  Result Value Ref Range Status   Enterococcus species NOT DETECTED NOT DETECTED Final   Listeria monocytogenes NOT DETECTED NOT DETECTED Final   Staphylococcus species DETECTED (A) NOT DETECTED Final    Comment: Methicillin (oxacillin) resistant coagulase negative staphylococcus. Possible blood culture  contaminant (unless isolated from more than one blood culture draw or clinical case suggests pathogenicity). No antibiotic  treatment is indicated for blood  culture contaminants. CRITICAL RESULT CALLED TO, READ BACK BY AND VERIFIED WITH: Sheffield Slider PharmD 11:15 02/17/18 ([email or web address])    Staphylococcus aureus (BCID) NOT DETECTED NOT DETECTED Final   Methicillin resistance DETECTED (A) NOT DETECTED Final    Comment: CRITICAL RESULT CALLED TO, READ BACK BY AND VERIFIED WITH: Sheffield Slider PharmD 11:15 02/17/18 ([email or web address])    Streptococcus species NOT DETECTED NOT DETECTED Final   Streptococcus agalactiae NOT DETECTED NOT DETECTED Final   Streptococcus pneumoniae NOT DETECTED NOT DETECTED Final   Streptococcus pyogenes NOT DETECTED NOT DETECTED Final   Acinetobacter baumannii NOT DETECTED NOT DETECTED Final   Enterobacteriaceae species DETECTED (A) NOT DETECTED Final    Comment: Enterobacteriaceae represent a large family of gram-negative bacteria, not a single organism. CRITICAL RESULT CALLED TO, READ BACK BY AND VERIFIED WITH: Sheffield Slider PharmD 11:15 02/17/18 (wilsonm)    Enterobacter cloacae complex NOT DETECTED NOT DETECTED Final   Escherichia coli DETECTED (A) NOT DETECTED Final    Comment: CRITICAL RESULT CALLED TO, READ BACK BY AND VERIFIED WITH: Sheffield Slider PharmD 11:15 02/17/18 ([email or web address])    Klebsiella oxytoca NOT DETECTED NOT DETECTED Final   Klebsiella pneumoniae NOT DETECTED NOT DETECTED Final   Proteus species NOT DETECTED NOT DETECTED Final   Serratia marcescens NOT DETECTED NOT DETECTED Final   Carbapenem resistance NOT DETECTED NOT DETECTED Final   Haemophilus influenzae NOT DETECTED NOT DETECTED Final   Neisseria meningitidis NOT DETECTED NOT DETECTED Final   Pseudomonas aeruginosa NOT DETECTED NOT DETECTED Final   Candida albicans NOT DETECTED NOT DETECTED Final   Candida glabrata NOT DETECTED NOT DETECTED Final   Candida krusei NOT DETECTED NOT DETECTED Final   Candida  parapsilosis NOT DETECTED NOT DETECTED Final   Candida tropicalis NOT DETECTED NOT DETECTED Final    Comment: Performed at [facility], [place] [address] [place], [place] [address]  Blood Culture (routine x 2)     Status: None (Preliminary result)   Collection Time: 02/16/18  7:20 PM  Result Value Ref Range Status   Specimen Description   Final    BLOOD LEFT ANTECUBITAL Performed at [facility] [address], [place], [place] [address]    Special Requests   Final    BOTTLES DRAWN AEROBIC AND ANAEROBIC Blood Culture adequate volume Performed at [facility] [address] [place], [place] [address]    Culture  Setup Time   Final    GRAM NEGATIVE RODS AEROBIC BOTTLE ONLY CRITICAL VALUE NOTED.  VALUE IS CONSISTENT WITH PREVIOUSLY REPORTED AND CALLED VALUE. Performed at [facility], [place] [address], [place],  [address]    Culture GRAM NEGATIVE RODS  Final   Report Status PENDING  Incomplete  MRSA PCR Screening     Status: None   Collection Time: 02/18/18 12:14 AM  Result Value Ref Range Status   MRSA by PCR NEGATIVE NEGATIVE Final    Comment:        The GeneXpert MRSA Assay (FDA approved for NASAL specimens only), is one component of a comprehensive MRSA colonization surveillance program. It is not intended to diagnose MRSA infection nor to guide or monitor treatment for MRSA infections. Performed at [facility], [place] [address] [place],  [address]      Radiology Studies: Dg Ercp Biliary & Pancreatic Ducts  Result Date: 02/18/2018 CLINICAL DATA:  Common bile duct stone. Evaluate patency of the CBD. EXAM: ERCP TECHNIQUE: Multiple  spot images obtained with the fluoroscopic device and submitted for interpretation post-procedure. COMPARISON:  CT abdomen pelvis-02/17/2018 FINDINGS: Six spot intraoperative fluoroscopic images of the right upper abdominal quadrant during ERCP are provided for  review Initial image demonstrates an ERCP probe overlying the right upper abdominal quadrant. There is suspected cannulation of the common bile duct. Cholecystectomy clips overlies expected location of the gallbladder fossa. Subsequent images demonstrate selective cannulation and opacification of the common bile duct. There are multiple nonocclusive filling defects seen within the CBD worrisome for choledocholithiasis as was suggested on preceding abdominal CT. Subsequent images demonstrate insufflation of a balloon within the CBD with subsequent biliary sweeping and presumed sphincterotomy. IMPRESSION: ERCP with findings suggestive of choledocholithiasis with subsequent presumed biliary sweeping and sphincterotomy. These images were submitted for radiologic interpretation only. Please see the procedural report for the amount of contrast and the fluoroscopy time utilized. Electronically Signed   By: Sandi Mariscal M.D.   On: 02/18/2018 12:05    Scheduled Meds: . collagenase   Topical Daily  . feeding supplement (ENSURE ENLIVE)  237 mL Oral BID BM  . latanoprost  1 drop Both Eyes QHS  . multivitamin with minerals  1 tablet Oral Daily   Continuous Infusions: . sodium chloride Stopped (02/19/18 0140)  . meropenem (MERREM) IV Stopped (02/19/18 0322)     LOS: 2 days   Marylu Lund, MD [facility] Pager On Amion  If 7PM-7AM, please contact night-coverage 02/19/2018, 8:42 AM

## 2018-02-19 NOTE — Progress Notes (Addendum)
[facility] Progress Note  CC:  CBD stones/cholangitis  Subjective:  Feeling much better today.  Having conversation, sitting up in bed.  ERCP on 1/15 showed the following:  -Extensive choledocholithiasis was found and treated with biliary sphincterotomy and balloon sweeping. - Purulent cholangitis noted.  LFT's trending down.  WBC count normalized.  Objective:  Vital signs in last 24 hours: Temp:  [97.6 F (36.4 C)-98.3 F (36.8 C)] 98 F (36.7 C) (01/16 0800) Pulse Rate:  [55-113] 90 (01/16 0828) Resp:  [10-19] 15 (01/16 0600) BP: (97-174)/(57-103) 150/98 (01/16 0828) SpO2:  [94 %-100 %] 95 % (01/16 0828) Weight:  [70.6 kg] 70.6 kg (01/16 0500) Last BM Date: 02/18/18 General:  Alert, Well-developed, in NAD Heart:  Regular rate and rhythm; no murmurs Pulm:  Diminished on the left but clear. Abdomen:  Soft, non-distended.  BS present.  Non-tender.  Extremities:  Trace edema in B/L LE's.  Right arm with some dependent edema. Neurologic:  Alert and oriented to self;  grossly normal neurologically.  Intake/Output from previous day: 01/15 0701 - 01/16 0700 In: 2566.6 [I.V.:2169.3; IV Piggyback:397.3] Out: 0   Lab Results: Recent Labs    02/17/18 0435 02/18/18 0308 02/19/18 0311  WBC 19.0* 14.6* 9.9  HGB 10.1* 12.1 10.3*  HCT 34.9* 40.8 35.6*  PLT 193 124* 197   BMET Recent Labs    02/17/18 0325 02/18/18 0308 02/19/18 0311  NA 144 147* 145  K 3.4* 3.2* 3.3*  CL 108 109 115*  CO2 28 27 24   GLUCOSE 145* 108* 96  BUN 17 14 12   CREATININE 0.68  0.77 0.68 0.54  CALCIUM 7.6* 8.0* 7.7*   LFT Recent Labs    02/17/18 0325 02/19/18 0311  PROT 5.4* 5.0*  ALBUMIN 2.0* 1.9*  AST 142* 71*  ALT 70* 56*  ALKPHOS 373* 299*  BILITOT 3.4* 2.2*  BILIDIR 2.2*  --   IBILI 1.2*  --    PT/INR Recent Labs    02/17/18 0325  LABPROT 17.2*  INR 1.42   Dg Ercp Biliary & Pancreatic Ducts  Result Date: 02/18/2018 CLINICAL DATA:  Common bile duct  stone. Evaluate patency of the CBD. EXAM: ERCP TECHNIQUE: Multiple spot images obtained with the fluoroscopic device and submitted for interpretation post-procedure. COMPARISON:  CT abdomen pelvis-02/17/2018 FINDINGS: Six spot intraoperative fluoroscopic images of the right upper abdominal quadrant during ERCP are provided for review Initial image demonstrates an ERCP probe overlying the right upper abdominal quadrant. There is suspected cannulation of the common bile duct. Cholecystectomy clips overlies expected location of the gallbladder fossa. Subsequent images demonstrate selective cannulation and opacification of the common bile duct. There are multiple nonocclusive filling defects seen within the CBD worrisome for choledocholithiasis as was suggested on preceding abdominal CT. Subsequent images demonstrate insufflation of a balloon within the CBD with subsequent biliary sweeping and presumed sphincterotomy. IMPRESSION: ERCP with findings suggestive of choledocholithiasis with subsequent presumed biliary sweeping and sphincterotomy. These images were submitted for radiologic interpretation only. Please see the procedural report for the amount of contrast and the fluoroscopy time utilized. Electronically Signed   By: Sandi Mariscal M.D.   On: 02/18/2018 12:05   Assessment / Plan: *83 year old female admitted with AMS and fever.  In the ED found to be febrile, hypotensive, tachy.  LFT's elevated and had a leukocytosis of 19K.  CT scan showing intrahepatic biliary dilation and multiple stones within the CBD.  She is s/p cholecystectomy in 07/2017.  Underwent ERCP  on 1/15 with multiple stones extracted and signs of cholangitis.  Much improved this AM.  WBC count normalized and LFT's trending down.  Continue IV antibiotics while inpatient.  ? Switch to cipro upon discharge and for how long she will need PO antibiotics upon discharge.  Anticipate discharge within the next day or 2.  Will sign off from GI standpoint.   Call with questions.    LOS: 2 days   Laban Emperor. Zehr  02/19/2018, 9:23 AM  ________________________________________________________________________  Velora Heckler GI MD note:  I personally examined the patient, reviewed the data and agree with the assessment and plan described above.   Owens Loffler, MD [facility] Pager [phone]

## 2018-02-19 NOTE — Progress Notes (Signed)
LCSW following for dc planning.   Patient LTC resident at [facility].   LCSW left message for patients son to complete assessment.   Carolin Coy [place]

## 2018-02-20 ENCOUNTER — Inpatient Hospital Stay: Payer: Self-pay

## 2018-02-20 LAB — CBC
HCT: 35.6 % — ABNORMAL LOW (ref 36.0–46.0)
Hemoglobin: 10.4 g/dL — ABNORMAL LOW (ref 12.0–15.0)
MCH: 26.4 pg (ref 26.0–34.0)
MCHC: 29.2 g/dL — ABNORMAL LOW (ref 30.0–36.0)
MCV: 90.4 fL (ref 80.0–100.0)
Platelets: 232 10*3/uL (ref 150–400)
RBC: 3.94 MIL/uL (ref 3.87–5.11)
RDW: 17.1 % — ABNORMAL HIGH (ref 11.5–15.5)
WBC: 10.8 10*3/uL — ABNORMAL HIGH (ref 4.0–10.5)
nRBC: 0 % (ref 0.0–0.2)

## 2018-02-20 LAB — COMPREHENSIVE METABOLIC PANEL
ALT: 44 U/L (ref 0–44)
AST: 45 U/L — ABNORMAL HIGH (ref 15–41)
Albumin: 2.1 g/dL — ABNORMAL LOW (ref 3.5–5.0)
Alkaline Phosphatase: 358 U/L — ABNORMAL HIGH (ref 38–126)
Anion gap: 7 (ref 5–15)
BUN: 10 mg/dL (ref 8–23)
CHLORIDE: 116 mmol/L — AB (ref 98–111)
CO2: 24 mmol/L (ref 22–32)
Calcium: 7.9 mg/dL — ABNORMAL LOW (ref 8.9–10.3)
Creatinine, Ser: 0.54 mg/dL (ref 0.44–1.00)
GFR calc Af Amer: >60 mL/min (ref >60)
GFR calc non Af Amer: >60 mL/min (ref >60)
Glucose, Bld: 84 mg/dL (ref 70–99)
POTASSIUM: 3.7 mmol/L (ref 3.5–5.1)
Sodium: 147 mmol/L — ABNORMAL HIGH (ref 135–145)
Total Bilirubin: 1.9 mg/dL — ABNORMAL HIGH (ref 0.3–1.2)
Total Protein: 5 g/dL — ABNORMAL LOW (ref 6.5–8.1)

## 2018-02-20 LAB — GLUCOSE, CAPILLARY
Glucose-Capillary: 66 mg/dL — ABNORMAL LOW (ref 70–99)
Glucose-Capillary: 80 mg/dL (ref 70–99)
Glucose-Capillary: 81 mg/dL (ref 70–99)
Glucose-Capillary: 83 mg/dL (ref 70–99)
Glucose-Capillary: 83 mg/dL (ref 70–99)

## 2018-02-20 LAB — CULTURE, BLOOD (ROUTINE X 2): Special Requests: ADEQUATE

## 2018-02-20 NOTE — Progress Notes (Signed)
Son returned call as per caller ID.  After confirming relationship with patient attempted to explain PICC line and insertion.  Son with many questions "I just want to know if she is gonna live", "Nobody told me anything about all of this", "She didn't have an IV when I was last there, why is she needing all of this"?  Explained to son that the doctor has ordered the PICC for discharge to SNF with antibiotics but I did not have answers for the other questions and he would need to have doctor talk with him.  Son wants to "do whatever my momma needs but I don't know about any of this".   Informed him that we would request the doctor call him and answer the questions that he has and then place PICC.  Lupita Shutter, RN regarding above conversation and Festus Holts states that she will notify MD to call son.  Carolee Rota, RN VAST

## 2018-02-20 NOTE — Progress Notes (Signed)
Right hand IV infiltrated-I have it propped up on a pillow with heat packs due to edema, non pitting, around the wrist area

## 2018-02-20 NOTE — Progress Notes (Signed)
PROGRESS NOTE    Autumn Bryant  IFO:[id number] DOB: 16-Dec-1935 DOA: 02/16/2018 PCP: [name], MD    Brief Narrative:  83 year old female who presented with altered mental status.  She does have significant past medical history for diastolic heart failure, anxiety, depression and polyneuropathy.  Patient was brought to the hospital due to altered mentation, abdominal pain and fever.  Unable to get further detailed history due to patient's confusion.  On her initial physical examination her blood pressure was 88/64, heart rate 94, respiratory rate 14, oxygen saturation 97%.  She was confused and disoriented, her lungs were clear to auscultation, heart S1-S2 present rhythmic, her abdomen was soft nontender, no lower extremity edema.  Sodium 148, potassium 2.8, chloride 103, bicarb 32, glucose 131, BUN 20, creatinine 1.0, alkaline phosphatase 533, AST 168, ALT 81, total bilirubin is 3.4, troponin I 0.04, lactic acid 2.9, 3.2.  Ammonia 25.  White cell count 18.1, hemoglobin 12.5, hematocrit 42.8, platelets 265.  INR 1.4.  Urinalysis more than 50 white cells, protein 30.  CT with intrahepatic biliary duct dilatation with multiple stones within the common bile duct.  Notable pulmonary nodules.  Chest radiograph with no infiltrates.  EKG with normal sinus rhythm, left axis deviation, poor R wave progression.  Patient was admitted to the hospital working diagnosis of sepsis, likely related to common bile duct obstruction.   Assessment & Plan:   Principal Problem:   Sepsis ([place]) Active Problems:   Urinary tract infection due to extended-spectrum beta lactamase (ESBL) producing Escherichia coli   Polyneuropathy   Bile duct stone  1. Sepsis due to common biliary duct obstruction (present on admission). Positive blood culture for E coli and staph MRSA. Initially on empiric ceftriaxone and Vancomycin. Now on meropenem 1/15. GI consulted and pt now s/p ERCP with findings of extensive  choledocholithiasis with purulence. GI rec to continue IV abx. ERCP was performed emergently given concerns of cholelithiasis and if untreated, may result in pt's death -States abd pain improved. Anticipating total 10 days of IV ertapenem tx. PICC requested.  -Alk phos appears slightly higher today although remainder of LFT seems improved. Repeat LFT in AM  2. Hypokalemia. Labs reviewed. Potassium corrected. Repeat bmet in AM  3. Depression. At home on escitalopram, mirtazapine, and venlafaxine, continue on hold for now due to encephalopathy. Seems more encephalopathic this AM. Cont above abx  4. Metabolic encephalopathy. Noted to be more sleepy this AM. Continue IV abx per above  DVT prophylaxis: SCD's Code Status: Full Family Communication: Pt in room, family not present Disposition Plan: Uncertain at this time, pending PT/OT eval  Consultants:   GI  Procedures:   ERCP 1/15  Antimicrobials: Anti-infectives (From admission, onward)   Start     Dose/Rate Route Frequency Ordered Stop   02/18/18 0815  meropenem (MERREM) 1 g in sodium chloride 0.9 % 100 mL IVPB     1 g 200 mL/hr over 30 Minutes Intravenous Every 8 hours 02/18/18 0812     02/17/18 1800  cefTRIAXone (ROCEPHIN) 2 g in sodium chloride 0.9 % 100 mL IVPB  Status:  Discontinued     2 g 200 mL/hr over 30 Minutes Intravenous Every 24 hours 02/17/18 1559 02/18/18 0811   02/17/18 1000  vancomycin (VANCOCIN) 1,500 mg in sodium chloride 0.9 % 500 mL IVPB  Status:  Discontinued     1,500 mg 250 mL/hr over 120 Minutes Intravenous Every 24 hours 02/17/18 0524 02/18/18 0811   02/17/18 0800  ceFEPIme (MAXIPIME)  2 g in sodium chloride 0.9 % 100 mL IVPB  Status:  Discontinued     2 g 200 mL/hr over 30 Minutes Intravenous 2 times daily 02/17/18 0514 02/17/18 0657   02/17/18 0800  meropenem (MERREM) 1 g in sodium chloride 0.9 % 100 mL IVPB  Status:  Discontinued     1 g 200 mL/hr over 30 Minutes Intravenous Every 8 hours 02/17/18  0741 02/17/18 1559   02/16/18 1930  ceFEPIme (MAXIPIME) 2 g in sodium chloride 0.9 % 100 mL IVPB     2 g 200 mL/hr over 30 Minutes Intravenous  Once 02/16/18 1916 02/16/18 2017   02/16/18 1930  metroNIDAZOLE (FLAGYL) IVPB 500 mg  Status:  Discontinued     500 mg 100 mL/hr over 60 Minutes Intravenous Every 8 hours 02/16/18 1916 02/17/18 0657   02/16/18 1930  vancomycin (VANCOCIN) IVPB 1000 mg/200 mL premix     1,000 mg 200 mL/hr over 60 Minutes Intravenous  Once 02/16/18 1916 02/16/18 2201      Subjective: Sleepy this AM, unable to converse  Objective: Vitals:   02/19/18 2151 02/20/18 0500 02/20/18 0552 02/20/18 1359  BP: 118/74  122/84 (!) 139/98  Pulse: 82  79 86  Resp: 16  16 15   Temp: 98.4 F (36.9 C)  98.1 F (36.7 C) 98 F (36.7 C)  TempSrc: Oral  Oral Oral  SpO2: 92%  94% 98%  Weight:  68.9 kg    Height:        Intake/Output Summary (Last 24 hours) at 02/20/2018 1536 Last data filed at 02/20/2018 8309 Gross per 24 hour  Intake 595.9 ml  Output -  Net 595.9 ml   Filed Weights   02/19/18 0500 02/19/18 1143 02/20/18 0500  Weight: 70.6 kg 69 kg 68.9 kg    Examination: General exam: asleep, difficult to arouse, in no acute distress Respiratory system: normal chest rise, clear, no audible wheezing Cardiovascular system: regular rhythm, s1-s2 Gastrointestinal system: Nondistended, nontender, pos BS Central nervous system: No seizures, no tremors Extremities: No cyanosis, no joint deformities Skin: No rashes, no pallor Psychiatry: Unable to assess given level of alertness  Data Reviewed: I have personally reviewed following labs and imaging studies  CBC: Recent Labs  Lab 02/16/18 1915 02/17/18 0435 02/18/18 0308 02/19/18 0311 02/20/18 0325  WBC 18.1* 19.0* 14.6* 9.9 10.8*  NEUTROABS 16.8* 16.7* 13.2*  --   --   HGB 12.5 10.1* 12.1 10.3* 10.4*  HCT 42.8 34.9* 40.8 35.6* 35.6*  MCV 89.5 90.6 87.9 89.7 90.4  PLT 265 193 124* 197 407   Basic Metabolic  Panel: Recent Labs  Lab 02/16/18 1900 02/16/18 2227 02/17/18 0325 02/18/18 0308 02/19/18 0311 02/20/18 0325  NA 148*  --  144 147* 145 147*  K 2.8*  --  3.4* 3.2* 3.3* 3.7  CL 103  --  108 109 115* 116*  CO2 32  --  28 27 24 24   GLUCOSE 131*  --  145* 108* 96 84  BUN 20  --  17 14 12 10   CREATININE 1.01*  --  0.68  0.77 0.68 0.54 0.54  CALCIUM 8.4*  --  7.6* 8.0* 7.7* 7.9*  MG  --  2.1  --   --   --   --    GFR: Estimated Creatinine Clearance: 54.7 mL/min (by C-G formula based on SCr of 0.54 mg/dL). Liver Function Tests: Recent Labs  Lab 02/16/18 1900 02/17/18 0325 02/19/18 0311 02/20/18 0325  AST 168* 142*  71* 45*  ALT 81* 70* 56* 44  ALKPHOS 533* 373* 299* 358*  BILITOT 3.4* 3.4* 2.2* 1.9*  PROT 7.0 5.4* 5.0* 5.0*  ALBUMIN 2.8* 2.0* 1.9* 2.1*   Recent Labs  Lab 02/17/18 0325  LIPASE 21   Recent Labs  Lab 02/17/18 0435  AMMONIA 25   Coagulation Profile: Recent Labs  Lab 02/17/18 0325  INR 1.42   Cardiac Enzymes: Recent Labs  Lab 02/17/18 0325  TROPONINI 0.04*   BNP (last 3 results) No results for input(s): PROBNP in the last 8760 hours. HbA1C: No results for input(s): HGBA1C in the last 72 hours. CBG: Recent Labs  Lab 02/19/18 1202 02/19/18 1738 02/20/18 0021 02/20/18 0559 02/20/18 1145  GLUCAP 95 82 83 66* 81   Lipid Profile: No results for input(s): CHOL, HDL, LDLCALC, TRIG, CHOLHDL, LDLDIRECT in the last 72 hours. Thyroid Function Tests: No results for input(s): TSH, T4TOTAL, FREET4, T3FREE, THYROIDAB in the last 72 hours. Anemia Panel: No results for input(s): VITAMINB12, FOLATE, FERRITIN, TIBC, IRON, RETICCTPCT in the last 72 hours. Sepsis Labs: Recent Labs  Lab 02/16/18 1926 02/16/18 2207 02/17/18 0325 02/18/18 0308  PROCALCITON  --   --  23.47  --   LATICACIDVEN 2.94* 3.24* 1.2 1.8    Recent Results (from the past 240 hour(s))  Blood Culture (routine x 2)     Status: Abnormal   Collection Time: 02/16/18  7:15 PM    Result Value Ref Range Status   Specimen Description   Final    LEFT ANTECUBITAL Performed at [facility] [address] [place], [place] [address]    Special Requests   Final    BOTTLES DRAWN AEROBIC AND ANAEROBIC Blood Culture adequate volume Performed at [facility] [address], [place], [place] [address]    Culture  Setup Time   Final    GRAM NEGATIVE RODS IN BOTH AEROBIC AND ANAEROBIC BOTTLES GRAM POSITIVE COCCI AEROBIC BOTTLE ONLY CRITICAL RESULT CALLED TO, READ BACK BY AND VERIFIED WITH: Sheffield Slider PharmD 11:15 02/17/18 ([email or web address])    Culture (A)  Final    ESCHERICHIA COLI Confirmed Extended Spectrum Beta-Lactamase Producer (ESBL).  In bloodstream infections from ESBL organisms, carbapenems are preferred over piperacillin/tazobactam. They are shown to have a lower risk of mortality. STAPHYLOCOCCUS SPECIES (COAGULASE NEGATIVE) THE SIGNIFICANCE OF ISOLATING THIS ORGANISM FROM A SINGLE SET OF BLOOD CULTURES WHEN MULTIPLE SETS ARE DRAWN IS UNCERTAIN. PLEASE NOTIFY THE MICROBIOLOGY DEPARTMENT WITHIN ONE WEEK IF SPECIATION AND SENSITIVITIES ARE REQUIRED. Performed at [facility], [place] [address], [place], [place] [address]    Report Status 02/19/2018 FINAL  Final   Organism ID, Bacteria ESCHERICHIA COLI  Final      Susceptibility   Escherichia coli - MIC*    AMPICILLIN >=32 RESISTANT Resistant     CEFAZOLIN >=64 RESISTANT Resistant     CEFEPIME RESISTANT Resistant     CEFTAZIDIME >=64 RESISTANT Resistant     CEFTRIAXONE >=64 RESISTANT Resistant     CIPROFLOXACIN >=4 RESISTANT Resistant     GENTAMICIN <=1 SENSITIVE Sensitive     IMIPENEM <=0.25 SENSITIVE Sensitive     TRIMETH/SULFA >=320 RESISTANT Resistant     AMPICILLIN/SULBACTAM >=32 RESISTANT Resistant     PIP/TAZO 8 SENSITIVE Sensitive     Extended ESBL POSITIVE Resistant     * ESCHERICHIA COLI  Blood Culture ID Panel (Reflexed)     Status: Abnormal   Collection Time:  02/16/18  7:15 PM  Result Value  Ref Range Status   Enterococcus species NOT DETECTED NOT DETECTED Final   Listeria monocytogenes NOT DETECTED NOT DETECTED Final   Staphylococcus species DETECTED (A) NOT DETECTED Final    Comment: Methicillin (oxacillin) resistant coagulase negative staphylococcus. Possible blood culture contaminant (unless isolated from more than one blood culture draw or clinical case suggests pathogenicity). No antibiotic treatment is indicated for blood  culture contaminants. CRITICAL RESULT CALLED TO, READ BACK BY AND VERIFIED WITH: Sheffield Slider PharmD 11:15 02/17/18 (wilsonm)    Staphylococcus aureus (BCID) NOT DETECTED NOT DETECTED Final   Methicillin resistance DETECTED (A) NOT DETECTED Final    Comment: CRITICAL RESULT CALLED TO, READ BACK BY AND VERIFIED WITH: Sheffield Slider PharmD 11:15 02/17/18 ([email or web address])    Streptococcus species NOT DETECTED NOT DETECTED Final   Streptococcus agalactiae NOT DETECTED NOT DETECTED Final   Streptococcus pneumoniae NOT DETECTED NOT DETECTED Final   Streptococcus pyogenes NOT DETECTED NOT DETECTED Final   Acinetobacter baumannii NOT DETECTED NOT DETECTED Final   Enterobacteriaceae species DETECTED (A) NOT DETECTED Final    Comment: Enterobacteriaceae represent a large family of gram-negative bacteria, not a single organism. CRITICAL RESULT CALLED TO, READ BACK BY AND VERIFIED WITH: Sheffield Slider PharmD 11:15 02/17/18 (wilsonm)    Enterobacter cloacae complex NOT DETECTED NOT DETECTED Final   Escherichia coli DETECTED (A) NOT DETECTED Final    Comment: CRITICAL RESULT CALLED TO, READ BACK BY AND VERIFIED WITH: Sheffield Slider PharmD 11:15 02/17/18 (wilsonm)    Klebsiella oxytoca NOT DETECTED NOT DETECTED Final   Klebsiella pneumoniae NOT DETECTED NOT DETECTED Final   Proteus species NOT DETECTED NOT DETECTED Final   Serratia marcescens NOT DETECTED NOT DETECTED Final   Carbapenem resistance NOT DETECTED NOT DETECTED Final   Haemophilus  influenzae NOT DETECTED NOT DETECTED Final   Neisseria meningitidis NOT DETECTED NOT DETECTED Final   Pseudomonas aeruginosa NOT DETECTED NOT DETECTED Final   Candida albicans NOT DETECTED NOT DETECTED Final   Candida glabrata NOT DETECTED NOT DETECTED Final   Candida krusei NOT DETECTED NOT DETECTED Final   Candida parapsilosis NOT DETECTED NOT DETECTED Final   Candida tropicalis NOT DETECTED NOT DETECTED Final    Comment: Performed at [facility], [place] [address], [place], [place] [address]  Blood Culture (routine x 2)     Status: Abnormal   Collection Time: 02/16/18  7:20 PM  Result Value Ref Range Status   Specimen Description   Final    BLOOD LEFT ANTECUBITAL Performed at [facility] [address] [place], [place] [address]    Special Requests   Final    BOTTLES DRAWN AEROBIC AND ANAEROBIC Blood Culture adequate volume Performed at [facility] [address], [place], [place] [address]    Culture  Setup Time   Final    GRAM NEGATIVE RODS AEROBIC BOTTLE ONLY CRITICAL VALUE NOTED.  VALUE IS CONSISTENT WITH PREVIOUSLY REPORTED AND CALLED VALUE.    Culture (A)  Final    ESCHERICHIA COLI SUSCEPTIBILITIES PERFORMED ON PREVIOUS CULTURE WITHIN THE LAST 5 DAYS. Performed at [facility], [place] [address], [place], [place] [address]    Report Status 02/20/2018 FINAL  Final  MRSA PCR Screening     Status: None   Collection Time: 02/18/18 12:14 AM  Result Value Ref Range Status   MRSA by PCR NEGATIVE NEGATIVE Final    Comment:        The GeneXpert MRSA Assay (FDA approved for NASAL specimens only),  is one component of a comprehensive MRSA colonization surveillance program. It is not intended to diagnose MRSA infection nor to guide or monitor treatment for MRSA infections. Performed at [facility], [place] [address], [place], [place] [address]      Radiology Studies: [place] Ekg Site [place]  Result Date:  02/20/2018 If [facility] image not attached, placement could not be confirmed due to current cardiac rhythm.   Scheduled Meds: . collagenase   Topical Daily  . feeding supplement (ENSURE ENLIVE)  237 mL Oral BID BM  . latanoprost  1 drop Both Eyes QHS  . multivitamin with minerals  1 tablet Oral Daily   Continuous Infusions: . sodium chloride 75 mL/hr at 02/20/18 1143  . meropenem (MERREM) IV 1 g (02/20/18 1145)     LOS: 3 days   Marylu Lund, MD [facility] Pager On Amion  If 7PM-7AM, please contact night-coverage 02/20/2018, 3:36 PM

## 2018-02-20 NOTE — Progress Notes (Signed)
OT Cancellation Note  Patient Details Name: Autumn Bryant MRN: [id number] DOB: 1935/05/29   Cancelled Treatment:    Reason Eval/Treat Not Completed: OT screened, no needs identified, will sign off   Pt is a LTC SNF resident who is total care at baseline. Will sign off. Recommend OOB to chair, using lift, with nursing. Recommend return to SNF at discharge.   Kari Baars, OT Acute Rehabilitation Services [phone] Office- [phone], Autumn Bryant 02/20/2018, 11:08 AM

## 2018-02-20 NOTE — NC FL2 (Signed)
[place] MEDICAID FL2 LEVEL OF CARE SCREENING TOOL     IDENTIFICATION  Patient Name: Autumn Bryant Birthdate: [birth year] Sex: female Admission Date (Current Location): 02/16/2018  [facility] and [place] Number:  Herbalist and Address:  [facility],  [place], [place]      Provider Number: [id number]  Attending Physician Name and Address:  Donne Hazel, MD  Relative Name and Phone Number:       Current Level of Care: Hospital Recommended Level of Care: [place] Prior Approval Number:    Date Approved/Denied:   PASRR Number: [id number]   Discharge Plan: SNF    Current Diagnoses: Patient Active Problem List   Diagnosis Date Noted  . Bile duct stone   . Sepsis ([place]) 02/17/2018  . Cholecystitis 07/31/2017  . Cholecystitis, acute with cholelithiasis 07/20/2017  . Pressure injury of skin 07/20/2017  . History of total right hip arthroplasty 01/05/2017  . Acute blood loss as cause of postoperative anemia 01/05/2017  . Escherichia coli urinary tract infection 01/05/2017  . Acute encephalopathy 01/05/2017  . Depression, recurrent ([place]) 01/05/2017  . Polyneuropathy 01/05/2017  . Closed right hip fracture ([place]) 12/25/2016  . Anxiety 11/03/2016  . Dysphagia 07/13/2016  . Anasarca 05/21/2016  . Obesity (BMI 30.0-34.9) 05/21/2016  . Closed TBI (traumatic brain injury) ([place]) 04/21/2016  . Status post insertion of percutaneous endoscopic gastrostomy (PEG) tube ([place]) 04/21/2016  . Facial laceration 04/21/2016  . Cheek wound, right, subsequent encounter 04/21/2016  . Urinary tract infection due to extended-spectrum beta lactamase (ESBL) producing Escherichia coli 04/21/2016  . Hypotension 04/21/2016  . Gout   . Hypertension     Orientation RESPIRATION BLADDER Height & Weight     Self  Normal Incontinent Weight: 151 lb 14.4 oz (68.9 kg) Height:  5\' 5"  (165.1 cm)  BEHAVIORAL SYMPTOMS/MOOD NEUROLOGICAL BOWEL  NUTRITION STATUS      Continent Diet(heart healthy)  AMBULATORY STATUS COMMUNICATION OF NEEDS Skin   Extensive Assist   PU Stage and Appropriate Care(stage II back, stage III sacrum)                       Personal Care Assistance Level of Assistance  Bathing, Feeding, Dressing Bathing Assistance: Maximum assistance Feeding assistance: Limited assistance Dressing Assistance: Maximum assistance     Functional Limitations Info  Sight, Hearing, Speech Sight Info: Adequate Hearing Info: Adequate Speech Info: Adequate    SPECIAL CARE FACTORS FREQUENCY                       Contractures Contractures Info: Not present    Additional Factors Info  Code Status, Allergies Code Status Info: full code Allergies Info: demerol           Current Medications (02/20/2018):  This is the current hospital active medication list Current Facility-Administered Medications  Medication Dose Route Frequency Provider Last Rate Last Dose  . 0.9 %  sodium chloride infusion   Intravenous Continuous Donne Hazel, MD 75 mL/hr at 02/20/18 1143    . acetaminophen (TYLENOL) tablet 650 mg  650 mg Oral Q6H PRN Donne Hazel, MD       Or  . acetaminophen (TYLENOL) suppository 650 mg  650 mg Rectal Q6H PRN Donne Hazel, MD      . bisacodyl (DULCOLAX) suppository 10 mg  10 mg Rectal Daily PRN Donne Hazel, MD      . collagenase Annitta Needs)  ointment   Topical Daily Donne Hazel, MD      . feeding supplement (ENSURE ENLIVE) (ENSURE ENLIVE) liquid 237 mL  237 mL Oral BID BM Donne Hazel, MD   237 mL at 02/19/18 1015  . latanoprost (XALATAN) 0.005 % ophthalmic solution 1 drop  1 drop Both Eyes QHS Donne Hazel, MD   1 drop at 02/19/18 2205  . meropenem (MERREM) 1 g in sodium chloride 0.9 % 100 mL IVPB  1 g Intravenous Q8H Donne Hazel, MD 200 mL/hr at 02/20/18 1145 1 g at 02/20/18 1145  . multivitamin with minerals tablet 1 tablet  1 tablet Oral Daily Donne Hazel, MD   1 tablet at  02/20/18 1146  . ondansetron (ZOFRAN) tablet 4 mg  4 mg Oral Q6H PRN Donne Hazel, MD       Or  . ondansetron [facility]) injection 4 mg  4 mg Intravenous Q6H PRN Donne Hazel, MD   4 mg at 02/18/18 1146     Discharge Medications: Please see discharge summary for a list of discharge medications.  Relevant Imaging Results:  Relevant Lab Results:   Additional Information SS# [id number]; ESBL infection contact precautions  Nila Nephew, LCSW

## 2018-02-20 NOTE — Progress Notes (Signed)
Attempted to reach son at [phone] as listed in the chart.  No answer, no voice mail message.  [place] Holts, RN updated, will notify IV team if son arrives at bedside.  Carolee Rota, RN VAST

## 2018-02-20 NOTE — Progress Notes (Signed)
Pharmacy Antibiotic Note  Autumn Bryant is a 83 y.o. female admitted on 02/16/2018 with sepsis - suspected source is IAI or UTI.  Pharmacy has been consulted for Meropenem dosing d/t history of ESBL Ecoli UTI.  Today, 02/20/2018:  Day 3 full meropenem  WBC essentially resolved  Remains afebrile since D2 of admission  SCr stable WNL   Plan:  Continue Meropenem 1g IV q8h  Will need outpatient IV abx (OPAT)  F/u ID recs for regimen selection and LOT   Height: 5\' 5"  (165.1 cm) Weight: 151 lb 14.4 oz (68.9 kg) IBW/kg (Calculated) : 57  Temp (24hrs), Avg:98.2 F (36.8 C), Min:98 F (36.7 C), Max:98.4 F (36.9 C)  Recent Labs  Lab 02/16/18 1900 02/16/18 1915 02/16/18 1926 02/16/18 2207 02/17/18 0325 02/17/18 0435 02/18/18 0308 02/19/18 0311 02/20/18 0325  WBC  --  18.1*  --   --   --  19.0* 14.6* 9.9 10.8*  CREATININE 1.01*  --   --   --  0.68  0.77  --  0.68 0.54 0.54  LATICACIDVEN  --   --  2.94* 3.24* 1.2  --  1.8  --   --     Estimated Creatinine Clearance: 54.7 mL/min (by C-G formula based on SCr of 0.54 mg/dL).    Allergies  Allergen Reactions  . Demerol [Meperidine] Swelling    Antimicrobials this admission:  1/14 Vancomycin >> 1/15 1/14 Cefepime >> 1/14 1/14 Meropenem >> 1/14, resumed 1/15 >>  1/14 ceftriaxone >> 1/15  Dose adjustments this admission:   Microbiology results:  1/13 BCx: 3/4 ESBL E coli; 1/4 bottles CoNS (contam) 1/15 MRSA PCR: neg Previous results:  12/25/16 UCx:  ESBL Ecoli (sens: imi, gent, ntf)  Thank you for allowing pharmacy to be a part of this patient's care.  Reuel Boom, PharmD, BCPS [phone] 02/20/2018, 2:22 PM

## 2018-02-20 NOTE — Clinical Social Work Note (Signed)
Clinical Social Work Assessment  Patient Details  Name: Autumn Bryant MRN: [id number] Date of Birth: [birth year]  Date of referral:  02/20/18               Reason for consult:  Discharge Planning                Permission sought to share information with:    Permission granted to share information::     Name::     son Aaron Edelman  Agency::  [name]  Relationship::     Contact Information:     Housing/Transportation Living arrangements for the past 2 months:  [place] of Information:  Facility, Medical Team Patient Interpreter Needed:  None Criminal Activity/Legal Involvement Pertinent to Current Situation/Hospitalization:  No - Comment as needed Significant Relationships:  Adult Children, Warehouse manager Lives with:  Facility Resident Do you feel safe going back to the place where you live?  Yes Need for family participation in patient care:  No (Coment)  Care giving concerns:  Pt admitted from [facility] where she is a long term care resident. Total care at baseline Admitted for sepsis due to biliary obstruction. Also encephalopathic   Facilities manager / plan:  CSW consult to assist with disposition as pt admitted from SNF - [facility], is long term care resident. Discussed with facility- left voicemail for son. Anticipate return at DC.  Will provide updated FL2.  Employment status:  Retired Forensic scientist:  [employer] PT Recommendations:  [place] / Referral to community resources:     Patient/Family's Response to care:  uta  Patient/Family's Understanding of and Emotional Response to Diagnosis, Current Treatment, and Prognosis:  uta  Emotional Assessment Appearance:  Appears stated age Attitude/Demeanor/Rapport:    Affect (typically observed):  Calm Orientation:  Oriented to Self Alcohol / Substance use:  Not Applicable Psych involvement (Current and /or in the community):  No  (Comment)  Discharge Needs  Concerns to be addressed:  Care Coordination Readmission within the last 30 days:  No Current discharge risk:  None Barriers to Discharge:  Continued Medical Work up   [employer], [place] 02/20/2018, 11:50 AM [phone]

## 2018-02-20 NOTE — Progress Notes (Signed)
PT Cancellation Note  Patient Details Name: Autumn Bryant MRN: [id number] DOB: 01-27-1936   Cancelled Treatment:    Reason Eval/Treat Not Completed: PT screened, no needs identified, will sign off. Pt is a LTC SNF resident who is total care at baseline. Will sign off. Recommend OOB to chair, using lift, with nursing. Recommend return to SNF at discharge.    Weston Anna, PT Acute Rehabilitation Services Pager: [phone] Office: [phone]

## 2018-02-20 NOTE — Progress Notes (Signed)
Attempted to call son at number listed in chart to obtain consent, no answer, message not left.  Will attempt to contact again later.  Carolee Rota, RN VAST

## 2018-02-20 NOTE — Care Management Important Message (Addendum)
Important Message  Patient Details  Name: Autumn Bryant MRN: [id number] Date of Birth: [birth year]   Medicare Important Message Given:  Yes. CMA gave patient's IM to the Care Management Nurse.     Quanika Solem 02/20/2018, 8:55 AM

## 2018-02-21 LAB — GLUCOSE, CAPILLARY
Glucose-Capillary: 74 mg/dL (ref 70–99)
Glucose-Capillary: 112 mg/dL — ABNORMAL HIGH (ref 70–99)
Glucose-Capillary: 92 mg/dL (ref 70–99)

## 2018-02-21 LAB — CBC
HCT: 34.1 % — ABNORMAL LOW (ref 36.0–46.0)
Hemoglobin: 10.1 g/dL — ABNORMAL LOW (ref 12.0–15.0)
MCH: 26.4 pg (ref 26.0–34.0)
MCHC: 29.6 g/dL — AB (ref 30.0–36.0)
MCV: 89 fL (ref 80.0–100.0)
Platelets: 192 10*3/uL (ref 150–400)
RBC: 3.83 MIL/uL — AB (ref 3.87–5.11)
RDW: 17.1 % — ABNORMAL HIGH (ref 11.5–15.5)
WBC: 7.6 10*3/uL (ref 4.0–10.5)
nRBC: 0 % (ref 0.0–0.2)

## 2018-02-21 LAB — COMPREHENSIVE METABOLIC PANEL
ALT: 33 U/L (ref 0–44)
AST: 30 U/L (ref 15–41)
Albumin: 1.9 g/dL — ABNORMAL LOW (ref 3.5–5.0)
Alkaline Phosphatase: 324 U/L — ABNORMAL HIGH (ref 38–126)
Anion gap: 9 (ref 5–15)
BILIRUBIN TOTAL: 1.1 mg/dL (ref 0.3–1.2)
BUN: 8 mg/dL (ref 8–23)
CO2: 25 mmol/L (ref 22–32)
Calcium: 7.6 mg/dL — ABNORMAL LOW (ref 8.9–10.3)
Chloride: 112 mmol/L — ABNORMAL HIGH (ref 98–111)
Creatinine, Ser: 0.42 mg/dL — ABNORMAL LOW (ref 0.44–1.00)
GFR calc Af Amer: >60 mL/min (ref >60)
GFR calc non Af Amer: >60 mL/min (ref >60)
Glucose, Bld: 87 mg/dL (ref 70–99)
Potassium: 3 mmol/L — ABNORMAL LOW (ref 3.5–5.1)
Sodium: 146 mmol/L — ABNORMAL HIGH (ref 135–145)
Total Protein: 4.8 g/dL — ABNORMAL LOW (ref 6.5–8.1)

## 2018-02-21 MED ORDER — POTASSIUM CHLORIDE CRYS ER 20 MEQ PO TBCR
40.0000 meq | EXTENDED_RELEASE_TABLET | Freq: Two times a day (BID) | ORAL | Status: AC
  Administered 2018-02-21 (×2): 40 meq via ORAL
  Filled 2018-02-21 (×2): qty 2

## 2018-02-21 NOTE — Treatment Plan (Signed)
Tried again to reach patient's son at number listed. Still no answer. Will try again later

## 2018-02-21 NOTE — Progress Notes (Signed)
Attempted to call son for PICC consent.  No answer at this time.

## 2018-02-21 NOTE — Treatment Plan (Signed)
Attempted to contact patient's son via provided phone number for update. No answer and went to voice mail. Will try again later.

## 2018-02-21 NOTE — Progress Notes (Signed)
Spoke with Verline Lema RN re PICC consent from son once he has spoken to MD.  RN to place IV Team consult once son is ready to give consent.

## 2018-02-21 NOTE — Progress Notes (Signed)
PROGRESS NOTE    Autumn Bryant  NUU:[id number] DOB: 1935/12/14 DOA: 02/16/2018 PCP: [name], MD    Brief Narrative:  83 year old female who presented with altered mental status.  She does have significant past medical history for diastolic heart failure, anxiety, depression and polyneuropathy.  Patient was brought to the hospital due to altered mentation, abdominal pain and fever.  Unable to get further detailed history due to patient's confusion.  On her initial physical examination her blood pressure was 88/64, heart rate 94, respiratory rate 14, oxygen saturation 97%.  She was confused and disoriented, her lungs were clear to auscultation, heart S1-S2 present rhythmic, her abdomen was soft nontender, no lower extremity edema.  Sodium 148, potassium 2.8, chloride 103, bicarb 32, glucose 131, BUN 20, creatinine 1.0, alkaline phosphatase 533, AST 168, ALT 81, total bilirubin is 3.4, troponin I 0.04, lactic acid 2.9, 3.2.  Ammonia 25.  White cell count 18.1, hemoglobin 12.5, hematocrit 42.8, platelets 265.  INR 1.4.  Urinalysis more than 50 white cells, protein 30.  CT with intrahepatic biliary duct dilatation with multiple stones within the common bile duct.  Notable pulmonary nodules.  Chest radiograph with no infiltrates.  EKG with normal sinus rhythm, left axis deviation, poor R wave progression.  Patient was admitted to the hospital working diagnosis of sepsis, likely related to common bile duct obstruction.   Assessment & Plan:   Principal Problem:   Sepsis ([place]) Active Problems:   Urinary tract infection due to extended-spectrum beta lactamase (ESBL) producing Escherichia coli   Polyneuropathy   Bile duct stone  1. Sepsis due to common biliary duct obstruction (present on admission). Positive blood culture for E coli and staph MRSA. Initially on empiric ceftriaxone and Vancomycin. Now on meropenem 1/15. GI consulted and pt now s/p ERCP with findings of extensive  choledocholithiasis with purulence. GI rec to continue IV abx. ERCP was performed emergently given concerns of cholelithiasis and if untreated, may result in pt's death -States abd pain improved. Anticipating total 10 days of IV ertapenem tx. PICC requested.  -LFT's and CBC improving  2. Hypokalemia. Labs reviewed. Potassium corrected. Repeat bmet in AM  3. Depression. At home on escitalopram, mirtazapine, and venlafaxine, continue on hold for now due to encephalopathy. Asleep this AM but arousable and conversant  4. Metabolic encephalopathy. Seems improved. Continue IV abx per above  DVT prophylaxis: SCD's Code Status: Full Family Communication: Pt in room, updated patient's son over phone Disposition Plan: Uncertain at this time, pending PT/OT eval  Consultants:   GI  Procedures:   ERCP 1/15  Antimicrobials: Anti-infectives (From admission, onward)   Start     Dose/Rate Route Frequency Ordered Stop   02/18/18 0815  meropenem (MERREM) 1 g in sodium chloride 0.9 % 100 mL IVPB     1 g 200 mL/hr over 30 Minutes Intravenous Every 8 hours 02/18/18 0812     02/17/18 1800  cefTRIAXone (ROCEPHIN) 2 g in sodium chloride 0.9 % 100 mL IVPB  Status:  Discontinued     2 g 200 mL/hr over 30 Minutes Intravenous Every 24 hours 02/17/18 1559 02/18/18 0811   02/17/18 1000  vancomycin (VANCOCIN) 1,500 mg in sodium chloride 0.9 % 500 mL IVPB  Status:  Discontinued     1,500 mg 250 mL/hr over 120 Minutes Intravenous Every 24 hours 02/17/18 0524 02/18/18 0811   02/17/18 0800  ceFEPIme (MAXIPIME) 2 g in sodium chloride 0.9 % 100 mL IVPB  Status:  Discontinued  2 g 200 mL/hr over 30 Minutes Intravenous 2 times daily 02/17/18 0514 02/17/18 0657   02/17/18 0800  meropenem (MERREM) 1 g in sodium chloride 0.9 % 100 mL IVPB  Status:  Discontinued     1 g 200 mL/hr over 30 Minutes Intravenous Every 8 hours 02/17/18 0741 02/17/18 1559   02/16/18 1930  ceFEPIme (MAXIPIME) 2 g in sodium chloride 0.9  % 100 mL IVPB     2 g 200 mL/hr over 30 Minutes Intravenous  Once 02/16/18 1916 02/16/18 2017   02/16/18 1930  metroNIDAZOLE (FLAGYL) IVPB 500 mg  Status:  Discontinued     500 mg 100 mL/hr over 60 Minutes Intravenous Every 8 hours 02/16/18 1916 02/17/18 0657   02/16/18 1930  vancomycin (VANCOCIN) IVPB 1000 mg/200 mL premix     1,000 mg 200 mL/hr over 60 Minutes Intravenous  Once 02/16/18 1916 02/16/18 2201      Subjective: Seems confused, without complaints otherwise  Objective: Vitals:   02/20/18 2120 02/21/18 0555 02/21/18 1000 02/21/18 1155  BP: (!) 148/75 139/80  (!) 159/94  Pulse: 89 80  86  Resp: 17 17  14   Temp: (!) 97.5 F (36.4 C) 98.1 F (36.7 C)  (!) 97.4 F (36.3 C)  TempSrc: Oral Oral  Oral  SpO2: 97% 97%  95%  Weight:   75.2 kg   Height:        Intake/Output Summary (Last 24 hours) at 02/21/2018 1707 Last data filed at 02/21/2018 1100 Gross per 24 hour  Intake 2619.11 ml  Output 900 ml  Net 1719.11 ml   Filed Weights   02/19/18 1143 02/20/18 0500 02/21/18 1000  Weight: 69 kg 68.9 kg 75.2 kg    Examination: General exam: Conversant, in no acute distress, asleep, arousable Respiratory system: normal chest rise, clear, no audible wheezing Cardiovascular system: regular rhythm, s1-s2 Gastrointestinal system: Nondistended, nontender, pos BS Central nervous system: No seizures, no tremors Extremities: No cyanosis, no joint deformities Skin: No rashes, no pallor Psychiatry: Affect normal // no auditory hallucinations   Data Reviewed: I have personally reviewed following labs and imaging studies  CBC: Recent Labs  Lab 02/16/18 1915 02/17/18 0435 02/18/18 0308 02/19/18 0311 02/20/18 0325 02/21/18 0325  WBC 18.1* 19.0* 14.6* 9.9 10.8* 7.6  NEUTROABS 16.8* 16.7* 13.2*  --   --   --   HGB 12.5 10.1* 12.1 10.3* 10.4* 10.1*  HCT 42.8 34.9* 40.8 35.6* 35.6* 34.1*  MCV 89.5 90.6 87.9 89.7 90.4 89.0  PLT 265 193 124* 197 232 700   Basic Metabolic  Panel: Recent Labs  Lab 02/16/18 2227 02/17/18 0325 02/18/18 0308 02/19/18 0311 02/20/18 0325 02/21/18 0325  NA  --  144 147* 145 147* 146*  K  --  3.4* 3.2* 3.3* 3.7 3.0*  CL  --  108 109 115* 116* 112*  CO2  --  28 27 24 24 25   GLUCOSE  --  145* 108* 96 84 87  BUN  --  17 14 12 10 8   CREATININE  --  0.68  0.77 0.68 0.54 0.54 0.42*  CALCIUM  --  7.6* 8.0* 7.7* 7.9* 7.6*  MG 2.1  --   --   --   --   --    GFR: Estimated Creatinine Clearance: 56.9 mL/min (A) (by C-G formula based on SCr of 0.42 mg/dL (L)). Liver Function Tests: Recent Labs  Lab 02/16/18 1900 02/17/18 0325 02/19/18 0311 02/20/18 0325 02/21/18 0325  AST 168* 142* 71* 45*  30  ALT 81* 70* 56* 44 33  ALKPHOS 533* 373* 299* 358* 324*  BILITOT 3.4* 3.4* 2.2* 1.9* 1.1  PROT 7.0 5.4* 5.0* 5.0* 4.8*  ALBUMIN 2.8* 2.0* 1.9* 2.1* 1.9*   Recent Labs  Lab 02/17/18 0325  LIPASE 21   Recent Labs  Lab 02/17/18 0435  AMMONIA 25   Coagulation Profile: Recent Labs  Lab 02/17/18 0325  INR 1.42   Cardiac Enzymes: Recent Labs  Lab 02/17/18 0325  TROPONINI 0.04*   BNP (last 3 results) No results for input(s): PROBNP in the last 8760 hours. HbA1C: No results for input(s): HGBA1C in the last 72 hours. CBG: Recent Labs  Lab 02/20/18 1145 02/20/18 1725 02/20/18 2326 02/21/18 0550 02/21/18 1152  GLUCAP 81 83 80 74 112*   Lipid Profile: No results for input(s): CHOL, HDL, LDLCALC, TRIG, CHOLHDL, LDLDIRECT in the last 72 hours. Thyroid Function Tests: No results for input(s): TSH, T4TOTAL, FREET4, T3FREE, THYROIDAB in the last 72 hours. Anemia Panel: No results for input(s): VITAMINB12, FOLATE, FERRITIN, TIBC, IRON, RETICCTPCT in the last 72 hours. Sepsis Labs: Recent Labs  Lab 02/16/18 1926 02/16/18 2207 02/17/18 0325 02/18/18 0308  PROCALCITON  --   --  23.47  --   LATICACIDVEN 2.94* 3.24* 1.2 1.8    Recent Results (from the past 240 hour(s))  Blood Culture (routine x 2)     Status:  Abnormal   Collection Time: 02/16/18  7:15 PM  Result Value Ref Range Status   Specimen Description   Final    LEFT ANTECUBITAL Performed at [facility] [address], [place], [place] [address]    Special Requests   Final    BOTTLES DRAWN AEROBIC AND ANAEROBIC Blood Culture adequate volume Performed at [facility] [address], [place], [place] [address]    Culture  Setup Time   Final    GRAM NEGATIVE RODS IN BOTH AEROBIC AND ANAEROBIC BOTTLES GRAM POSITIVE COCCI AEROBIC BOTTLE ONLY CRITICAL RESULT CALLED TO, READ BACK BY AND VERIFIED WITH: Sheffield Slider PharmD 11:15 02/17/18 ([email or web address])    Culture (A)  Final    ESCHERICHIA COLI Confirmed Extended Spectrum Beta-Lactamase Producer (ESBL).  In bloodstream infections from ESBL organisms, carbapenems are preferred over piperacillin/tazobactam. They are shown to have a lower risk of mortality. STAPHYLOCOCCUS SPECIES (COAGULASE NEGATIVE) THE SIGNIFICANCE OF ISOLATING THIS ORGANISM FROM A SINGLE SET OF BLOOD CULTURES WHEN MULTIPLE SETS ARE DRAWN IS UNCERTAIN. PLEASE NOTIFY THE MICROBIOLOGY DEPARTMENT WITHIN ONE WEEK IF SPECIATION AND SENSITIVITIES ARE REQUIRED. Performed at [facility], [place] [address] [place], [place] [address]    Report Status 02/19/2018 FINAL  Final   Organism ID, Bacteria ESCHERICHIA COLI  Final      Susceptibility   Escherichia coli - MIC*    AMPICILLIN >=32 RESISTANT Resistant     CEFAZOLIN >=64 RESISTANT Resistant     CEFEPIME RESISTANT Resistant     CEFTAZIDIME >=64 RESISTANT Resistant     CEFTRIAXONE >=64 RESISTANT Resistant     CIPROFLOXACIN >=4 RESISTANT Resistant     GENTAMICIN <=1 SENSITIVE Sensitive     IMIPENEM <=0.25 SENSITIVE Sensitive     TRIMETH/SULFA >=320 RESISTANT Resistant     AMPICILLIN/SULBACTAM >=32 RESISTANT Resistant     PIP/TAZO 8 SENSITIVE Sensitive     Extended ESBL POSITIVE Resistant     * ESCHERICHIA COLI  Blood Culture ID Panel  (Reflexed)     Status: Abnormal   Collection Time: 02/16/18  7:15 PM  Result Value Ref Range Status   Enterococcus species NOT DETECTED NOT DETECTED Final   Listeria monocytogenes NOT DETECTED NOT DETECTED Final   Staphylococcus species DETECTED (A) NOT DETECTED Final    Comment: Methicillin (oxacillin) resistant coagulase negative staphylococcus. Possible blood culture contaminant (unless isolated from more than one blood culture draw or clinical case suggests pathogenicity). No antibiotic treatment is indicated for blood  culture contaminants. CRITICAL RESULT CALLED TO, READ BACK BY AND VERIFIED WITH: Sheffield Slider PharmD 11:15 02/17/18 ([email or web address])    Staphylococcus aureus (BCID) NOT DETECTED NOT DETECTED Final   Methicillin resistance DETECTED (A) NOT DETECTED Final    Comment: CRITICAL RESULT CALLED TO, READ BACK BY AND VERIFIED WITH: Sheffield Slider PharmD 11:15 02/17/18 ([email or web address])    Streptococcus species NOT DETECTED NOT DETECTED Final   Streptococcus agalactiae NOT DETECTED NOT DETECTED Final   Streptococcus pneumoniae NOT DETECTED NOT DETECTED Final   Streptococcus pyogenes NOT DETECTED NOT DETECTED Final   Acinetobacter baumannii NOT DETECTED NOT DETECTED Final   Enterobacteriaceae species DETECTED (A) NOT DETECTED Final    Comment: Enterobacteriaceae represent a large family of gram-negative bacteria, not a single organism. CRITICAL RESULT CALLED TO, READ BACK BY AND VERIFIED WITH: Sheffield Slider PharmD 11:15 02/17/18 ([email or web address])    Enterobacter cloacae complex NOT DETECTED NOT DETECTED Final   Escherichia coli DETECTED (A) NOT DETECTED Final    Comment: CRITICAL RESULT CALLED TO, READ BACK BY AND VERIFIED WITH: Sheffield Slider PharmD 11:15 02/17/18 ([email or web address])    Klebsiella oxytoca NOT DETECTED NOT DETECTED Final   Klebsiella pneumoniae NOT DETECTED NOT DETECTED Final   Proteus species NOT DETECTED NOT DETECTED Final   Serratia marcescens NOT DETECTED NOT DETECTED Final   Carbapenem  resistance NOT DETECTED NOT DETECTED Final   Haemophilus influenzae NOT DETECTED NOT DETECTED Final   Neisseria meningitidis NOT DETECTED NOT DETECTED Final   Pseudomonas aeruginosa NOT DETECTED NOT DETECTED Final   Candida albicans NOT DETECTED NOT DETECTED Final   Candida glabrata NOT DETECTED NOT DETECTED Final   Candida krusei NOT DETECTED NOT DETECTED Final   Candida parapsilosis NOT DETECTED NOT DETECTED Final   Candida tropicalis NOT DETECTED NOT DETECTED Final    Comment: Performed at [facility], [place] [address] [place], [place] [address]  Blood Culture (routine x 2)     Status: Abnormal   Collection Time: 02/16/18  7:20 PM  Result Value Ref Range Status   Specimen Description   Final    BLOOD LEFT ANTECUBITAL Performed at [facility] [address] [place], [place] [address]    Special Requests   Final    BOTTLES DRAWN AEROBIC AND ANAEROBIC Blood Culture adequate volume Performed at [facility] [address], [place], [place] [address]    Culture  Setup Time   Final    GRAM NEGATIVE RODS AEROBIC BOTTLE ONLY CRITICAL VALUE NOTED.  VALUE IS CONSISTENT WITH PREVIOUSLY REPORTED AND CALLED VALUE.    Culture (A)  Final    ESCHERICHIA COLI SUSCEPTIBILITIES PERFORMED ON PREVIOUS CULTURE WITHIN THE LAST 5 DAYS. Performed at [facility], [place] [address] [place], [place] [address]    Report Status 02/20/2018 FINAL  Final  MRSA PCR Screening     Status: None   Collection Time: 02/18/18 12:14 AM  Result Value Ref Range Status   MRSA by PCR NEGATIVE NEGATIVE Final    Comment:        The GeneXpert MRSA Assay (FDA approved for NASAL  specimens only), is one component of a comprehensive MRSA colonization surveillance program. It is not intended to diagnose MRSA infection nor to guide or monitor treatment for MRSA infections. Performed at [facility], [place] [address], [place], [place] [address]       Radiology Studies: [place] Ekg Site Rite  Result Date: 02/20/2018 If [facility] image not attached, placement could not be confirmed due to current cardiac rhythm.   Scheduled Meds: . collagenase   Topical Daily  . feeding supplement (ENSURE ENLIVE)  237 mL Oral BID BM  . latanoprost  1 drop Both Eyes QHS  . multivitamin with minerals  1 tablet Oral Daily  . potassium chloride  40 mEq Oral BID   Continuous Infusions: . sodium chloride Stopped (02/21/18 1036)  . meropenem (MERREM) IV 200 mL/hr at 02/21/18 1100     LOS: 4 days   Marylu Lund, MD [facility] Pager On Amion  If 7PM-7AM, please contact night-coverage 02/21/2018, 5:07 PM

## 2018-02-22 DIAGNOSIS — Z7401 Bed confinement status: Secondary | ICD-10-CM | POA: Diagnosis not present

## 2018-02-22 DIAGNOSIS — R6520 Severe sepsis without septic shock: Secondary | ICD-10-CM | POA: Diagnosis not present

## 2018-02-22 DIAGNOSIS — A419 Sepsis, unspecified organism: Secondary | ICD-10-CM | POA: Diagnosis not present

## 2018-02-22 DIAGNOSIS — K8001 Calculus of gallbladder with acute cholecystitis with obstruction: Secondary | ICD-10-CM | POA: Diagnosis not present

## 2018-02-22 DIAGNOSIS — I10 Essential (primary) hypertension: Secondary | ICD-10-CM | POA: Diagnosis not present

## 2018-02-22 DIAGNOSIS — E876 Hypokalemia: Secondary | ICD-10-CM | POA: Diagnosis not present

## 2018-02-22 DIAGNOSIS — E669 Obesity, unspecified: Secondary | ICD-10-CM | POA: Diagnosis not present

## 2018-02-22 DIAGNOSIS — F419 Anxiety disorder, unspecified: Secondary | ICD-10-CM | POA: Diagnosis not present

## 2018-02-22 DIAGNOSIS — M810 Age-related osteoporosis without current pathological fracture: Secondary | ICD-10-CM | POA: Diagnosis not present

## 2018-02-22 DIAGNOSIS — M2550 Pain in unspecified joint: Secondary | ICD-10-CM | POA: Diagnosis not present

## 2018-02-22 DIAGNOSIS — R5381 Other malaise: Secondary | ICD-10-CM | POA: Diagnosis not present

## 2018-02-22 DIAGNOSIS — K8033 Calculus of bile duct with acute cholangitis with obstruction: Secondary | ICD-10-CM

## 2018-02-22 DIAGNOSIS — N390 Urinary tract infection, site not specified: Secondary | ICD-10-CM | POA: Diagnosis not present

## 2018-02-22 DIAGNOSIS — Z9181 History of falling: Secondary | ICD-10-CM | POA: Diagnosis not present

## 2018-02-22 DIAGNOSIS — I69828 Other speech and language deficits following other cerebrovascular disease: Secondary | ICD-10-CM | POA: Diagnosis not present

## 2018-02-22 DIAGNOSIS — L89153 Pressure ulcer of sacral region, stage 3: Secondary | ICD-10-CM | POA: Diagnosis not present

## 2018-02-22 DIAGNOSIS — M109 Gout, unspecified: Secondary | ICD-10-CM | POA: Diagnosis not present

## 2018-02-22 DIAGNOSIS — B9629 Other Escherichia coli [E. coli] as the cause of diseases classified elsewhere: Secondary | ICD-10-CM | POA: Diagnosis not present

## 2018-02-22 DIAGNOSIS — R1312 Dysphagia, oropharyngeal phase: Secondary | ICD-10-CM | POA: Diagnosis not present

## 2018-02-22 DIAGNOSIS — I509 Heart failure, unspecified: Secondary | ICD-10-CM | POA: Diagnosis not present

## 2018-02-22 DIAGNOSIS — M8588 Other specified disorders of bone density and structure, other site: Secondary | ICD-10-CM | POA: Diagnosis not present

## 2018-02-22 DIAGNOSIS — Z1612 Extended spectrum beta lactamase (ESBL) resistance: Secondary | ICD-10-CM | POA: Diagnosis not present

## 2018-02-22 DIAGNOSIS — M6281 Muscle weakness (generalized): Secondary | ICD-10-CM | POA: Diagnosis not present

## 2018-02-22 DIAGNOSIS — A4102 Sepsis due to Methicillin resistant Staphylococcus aureus: Secondary | ICD-10-CM | POA: Diagnosis not present

## 2018-02-22 DIAGNOSIS — R41841 Cognitive communication deficit: Secondary | ICD-10-CM | POA: Diagnosis not present

## 2018-02-22 DIAGNOSIS — K8031 Calculus of bile duct with cholangitis, unspecified, with obstruction: Secondary | ICD-10-CM | POA: Diagnosis not present

## 2018-02-22 DIAGNOSIS — Z48815 Encounter for surgical aftercare following surgery on the digestive system: Secondary | ICD-10-CM | POA: Diagnosis not present

## 2018-02-22 DIAGNOSIS — R601 Generalized edema: Secondary | ICD-10-CM | POA: Diagnosis not present

## 2018-02-22 DIAGNOSIS — G9341 Metabolic encephalopathy: Secondary | ICD-10-CM | POA: Diagnosis not present

## 2018-02-22 DIAGNOSIS — R279 Unspecified lack of coordination: Secondary | ICD-10-CM | POA: Diagnosis not present

## 2018-02-22 DIAGNOSIS — F339 Major depressive disorder, recurrent, unspecified: Secondary | ICD-10-CM | POA: Diagnosis not present

## 2018-02-22 DIAGNOSIS — A4151 Sepsis due to Escherichia coli [E. coli]: Secondary | ICD-10-CM | POA: Diagnosis not present

## 2018-02-22 DIAGNOSIS — G9340 Encephalopathy, unspecified: Secondary | ICD-10-CM | POA: Diagnosis not present

## 2018-02-22 LAB — GLUCOSE, CAPILLARY
GLUCOSE-CAPILLARY: 109 mg/dL — AB (ref 70–99)
Glucose-Capillary: 117 mg/dL — ABNORMAL HIGH (ref 70–99)
Glucose-Capillary: 126 mg/dL — ABNORMAL HIGH (ref 70–99)

## 2018-02-22 MED ORDER — SODIUM CHLORIDE 0.9 % IV SOLN
1.0000 g | INTRAVENOUS | Status: DC
  Administered 2018-02-22: 1000 mg via INTRAVENOUS
  Filled 2018-02-22: qty 1

## 2018-02-22 MED ORDER — SODIUM CHLORIDE 0.9% FLUSH: 10.0000 mL | INTRAVENOUS | Status: DC | PRN

## 2018-02-22 MED ORDER — SODIUM CHLORIDE 0.9% FLUSH
10.0000 mL | Freq: Two times a day (BID) | INTRAVENOUS | Status: DC
  Administered 2018-02-22: 10 mL

## 2018-02-22 MED ORDER — ALPRAZOLAM 0.5 MG PO TABS: 0.5000 mg | ORAL_TABLET | Freq: Two times a day (BID) | ORAL | 0 refills | Status: DC

## 2018-02-22 MED ORDER — ERTAPENEM IV (FOR PTA / DISCHARGE USE ONLY): 1.0000 g | INTRAVENOUS | 0 refills | Status: AC

## 2018-02-22 MED ORDER — OXYCODONE-ACETAMINOPHEN 5-325 MG PO TABS: 1.0000 | ORAL_TABLET | Freq: Three times a day (TID) | ORAL | 0 refills | Status: DC

## 2018-02-22 NOTE — Progress Notes (Signed)
Patient discharging to [facility]. Confirmed ability to return & faxed required docs. [name] will be used for transport - RN will call when patient is ready.  RN call report: [phone].   Pricilla Holm, MSW, [place] [employer] [phone]

## 2018-02-22 NOTE — Progress Notes (Signed)
Peripherally Inserted Central Catheter/Midline Placement  The IV Nurse has discussed with the patient and/or persons authorized to consent for the patient, the purpose of this procedure and the potential benefits and risks involved with this procedure.  The benefits include less needle sticks, lab draws from the catheter, and the patient may be discharged home with the catheter. Risks include, but not limited to, infection, bleeding, blood clot (thrombus formation), and puncture of an artery; nerve damage and irregular heartbeat and possibility to perform a PICC exchange if needed/ordered by physician.  Alternatives to this procedure were also discussed.  Bard Power PICC patient education guide, fact sheet on infection prevention and patient information card has been provided to patient /or left at bedside.  Telephone consent obtained from Aaron Edelman, son, on 02/21/18.  PICC/Midline Placement Documentation  PICC Single Lumen 02/22/18 PICC Right Cephalic 40 cm 2 cm (Active)  Indication for Insertion or Continuance of Line Prolonged intravenous therapies 02/22/2018  8:00 AM  Exposed Catheter (cm) 2 cm 02/22/2018  8:00 AM  Site Assessment Clean;Dry;Intact 02/22/2018  8:00 AM  Line Status Flushed;Saline locked;Blood return noted 02/22/2018  8:00 AM  Dressing Type Transparent 02/22/2018  8:00 AM  Dressing Status Clean;Dry;Intact;Antimicrobial disc in place 02/22/2018  8:00 AM  Line Care Connections checked and tightened 02/22/2018  8:00 AM  Line Adjustment (NICU/IV Team Only) No 02/22/2018  8:00 AM  Dressing Intervention New dressing 02/22/2018  8:00 AM  Dressing Change Due 03/01/18 02/22/2018  8:00 AM       [name] 02/22/2018, 8:23 AM

## 2018-02-22 NOTE — Discharge Summary (Addendum)
Physician Discharge Summary  Autumn Bryant OJJ:[id number] DOB: 1935/06/02 DOA: 02/16/2018  PCP: [name], MD  Admit date: 02/16/2018 Discharge date: 02/22/2018  Admitted From: SNF Disposition:  SNF  Recommendations for Outpatient Follow-up:  1. Follow up with PCP in 1-2 weeks 2. Please complete IV antibiotic through 1/24 dose 3. Routine PICC line care. Please d/c PICC after completion of above antibiotic  Discharge Condition:Improved CODE STATUS:Full Diet recommendation: Heart healthy   Brief/Interim Summary: 83 year old female who presented with altered mental status. She does have significant past medical history for diastolic heart failure, anxiety, depression and polyneuropathy. Patient was brought to the hospital due to altered mentation, abdominal pain and fever. Unable to get further detailed history due to patient's confusion. On her initial physical examination her blood pressure was 88/64, heart rate 94, respiratory rate 14, oxygen saturation 97%.She was confused and disoriented, her lungs were clear to auscultation, heart S1-S2 present rhythmic, her abdomen was soft nontender, no lower extremity edema.Sodium 148, potassium 2.8, chloride 103, bicarb 32, glucose 131, BUN 20, creatinine 1.0, alkaline phosphatase 533, AST 168, ALT 81, total bilirubin is 3.4,troponin I 0.04, lactic acid 2.9, 3.2.Ammonia 25. White cell count 18.1, hemoglobin 12.5, hematocrit 42.8, platelets 265.INR 1.4.Urinalysis more than 50 white cells, protein 30.CT with intrahepatic biliary duct dilatation with multiple stones within the common bile duct. Notable pulmonary nodules. Chest radiograph with no infiltrates. EKG with normal sinus rhythm, left axis deviation, poor R wave progression.  Patient was admitted to the hospital working diagnosis of sepsis, likely related to common bile duct obstruction.  Discharge Diagnoses:  Principal Problem:   Sepsis ([place]) Active Problems:    Urinary tract infection due to extended-spectrum beta lactamase (ESBL) producing Escherichia coli   Polyneuropathy   Bile duct stone   1. Sepsis due to common biliary duct obstruction (present on admission). Positive blood culture for E coli and staph MRSA. Initially on empiric ceftriaxone and Vancomycin. Now on meropenem 1/15. GI consulted and pt now s/p ERCP with findings of extensive choledocholithiasis with purulence. GI rec to continue IV abx. ERCP was performed emergently given concerns of cholelithiasis and if untreated, may result in pt's death -States abd pain improved. Anticipating total 10 days of IV antibiotic, plan to complete course of ertapenem through 1/24 dose. PICC placed 1/19 -LFT's and CBC improving  2. Hypokalemia. Labs reviewed. Potassium corrected.  3. Depression. At home on escitalopram, mirtazapine, and venlafaxine, continue on hold for now due to encephalopathy. Improved  4. Metabolic encephalopathy. Seems improved. Continue IV abx per above  Stage 3 sacral pressure injury and unstageable pressure injury to mid back   Discharge Instructions  Discharge Instructions    Home infusion instructions [facility] May follow [place] Dosing Protocol; May administer Cathflo as needed to maintain patency of vascular access device.; Flushing of vascular access device: per [facility] Protocol: 0.9% NaCl pre/post medica...   Complete by:  As directed    Instructions:  May follow [place] Dosing Protocol   Instructions:  May administer Cathflo as needed to maintain patency of vascular access device.   Instructions:  Flushing of vascular access device: per [facility] Protocol: 0.9% NaCl pre/post medication administration and prn patency; Heparin 100 u/ml, 83m for implanted ports and Heparin 10u/ml, 545mfor all other central venous catheters.   Instructions:  May follow [facility] Anaphylaxis Protocol for First Dose Administration in the home: 0.9% NaCl at 25-50 ml/hr to maintain IV  access for protocol meds. Epinephrine 0.3 ml IV/IM PRN  and Benadryl 25-50 IV/IM PRN s/s of anaphylaxis.   Instructions:  [place] Infusion Coordinator (RN) to assist per patient IV care needs in the home PRN.     Allergies as of 02/22/2018      Reactions   Demerol [meperidine] Swelling      Medication List    TAKE these medications   ALPRAZolam 0.5 MG tablet Commonly known as:  XANAX Take 1 tablet (0.5 mg total) by mouth 2 (two) times daily. Prescription required for discharge to SNF What changed:  additional instructions   bimatoprost 0.01 % Soln Commonly known as:  LUMIGAN Place 1 drop into both eyes at bedtime. For glaucoma   bisacodyl 10 MG suppository Commonly known as:  DULCOLAX Place 10 mg rectally daily as needed (constipation not relieved by MOM).   ENSURE Take 237 mLs by mouth. TID DUE TO WEIGHT LOSS ( CHOCOLATE IF AVAILABLE )   ertapenem  IVPB Commonly known as:  INVANZ Inject 1 g into the vein daily for 5 days. Indication:  ESBL bacteremia Last Day of Therapy:  1/24/202 Labs - Once weekly:  CBC/D and BMP, Labs - Every other week:  ESR and CRP   escitalopram 5 MG tablet Commonly known as:  LEXAPRO Take 5 mg by mouth daily.   feeding supplement (PRO-STAT SUGAR FREE 64) Liqd Take 30 mLs by mouth 2 (two) times daily.   FLEET ENEMA RE Place 1 each rectally daily as needed (constipation not relieved by bisacodyl suppository).   furosemide 20 MG tablet Commonly known as:  LASIX Take 20 mg by mouth daily.   gabapentin 600 MG tablet Commonly known as:  NEURONTIN Take 600 mg by mouth 3 (three) times daily.   MILK OF MAGNESIA PO Take 30 mLs by mouth daily as needed (if no BM in 3 days).   mirtazapine 7.5 MG tablet Commonly known as:  REMERON Take 7.5 mg by mouth. TAKE 1 TABLET BY MOUTH AT BEDTIME FOR APPETITE STIMULANT   MULTIPLE VITAMINS-MINERALS PO Take by mouth. TAKE 1 TABLET BY MOUTH ONCE DAILY TO PROMOTE WOUND HEALING    oxyCODONE-acetaminophen 5-325 MG tablet Commonly known as:  PERCOCET/ROXICET Take 1 tablet by mouth every 8 (eight) hours. for pain  Prescription required for discharge to SNF What changed:  additional instructions   sennosides-docusate sodium 8.6-50 MG tablet Commonly known as:  SENOKOT-S Take 2 tablets by mouth 2 (two) times daily as needed for constipation.   venlafaxine XR 75 MG 24 hr capsule Commonly known as:  EFFEXOR-XR Take 75 mg by mouth 2 (two) times daily. FOR DEPRESSION (DO NOT CRUSH)            Home Infusion Instuctions  (From admission, onward)         Start     Ordered   02/22/18 0000  Home infusion instructions [facility] May follow [place] Dosing Protocol; May administer Cathflo as needed to maintain patency of vascular access device.; Flushing of vascular access device: per [facility] Protocol: 0.9% NaCl pre/post medica...    Question Answer Comment  Instructions May follow [place] Dosing Protocol   Instructions May administer Cathflo as needed to maintain patency of vascular access device.   Instructions Flushing of vascular access device: per [facility] Protocol: 0.9% NaCl pre/post medication administration and prn patency; Heparin 100 u/ml, 96m for implanted ports and Heparin 10u/ml, 574mfor all other central venous catheters.   Instructions May follow [facility] Anaphylaxis Protocol for First Dose Administration in the home:  0.9% NaCl at 25-50 ml/hr to maintain IV access for protocol meds. Epinephrine 0.3 ml IV/IM PRN and Benadryl 25-50 IV/IM PRN s/s of anaphylaxis.   Instructions [facility] Infusion Coordinator (RN) to assist per patient IV care needs in the home PRN.      02/22/18 1001          Allergies  Allergen Reactions  . Demerol [Meperidine] Swelling    Consultations:  GI  Procedures/Studies: Ct Head Wo Contrast  Result Date: 02/17/2018 CLINICAL DATA:  Fever and altered mental status for 1 day. EXAM: CT HEAD WITHOUT CONTRAST  TECHNIQUE: Contiguous axial images were obtained from the base of the skull through the vertex without intravenous contrast. COMPARISON:  10/18/2017 FINDINGS: Brain: Patient positioning limits examination. Area of encephalomalacia demonstrated in the right frontal lobe without change since prior study. Mild diffuse cerebral atrophy. Ventricular dilatation, partially related to encephalomalacia. Low-attenuation changes in the deep white matter likely represent small vessel ischemia. No change in appearance of intracranial contents since previous study. No acute mass effect or abnormal extra-axial fluid collections. No acute intracranial hemorrhage. Vascular: Intracranial arterial vascular calcifications are present in the vertebrobasilar and carotid arteries. Skull: Focal cranial ostomy in the right anterior frontal region. No acute depressed skull fractures identified. Sinuses/Orbits: Opacification of a posterior right ethmoid air cells. Paranasal sinuses are otherwise clear. Mastoid air cells are clear. No change since prior study. Other: None. IMPRESSION: No acute intracranial abnormalities. Chronic atrophy and small vessel ischemic changes. Area of old encephalomalacia in the right frontal lobe unchanged. Electronically Signed   By: Lucienne Capers M.D.   On: 02/17/2018 03:06   US Abdomen Limited  Result Date: 02/16/2018 CLINICAL DATA:  83 y/o F; abdominal pain and abnormal liver function test. EXAM: ULTRASOUND ABDOMEN LIMITED RIGHT UPPER QUADRANT COMPARISON:  None. FINDINGS: Gallbladder: Not identified. Common bile duct: Diameter: 7 mm Liver: No focal lesion identified. Within normal limits in parenchymal echogenicity. Portal vein is patent on color Doppler imaging with normal direction of blood flow towards the liver. IMPRESSION: Gallbladder not identified. Otherwise unremarkable right upper quadrant ultrasound. Electronically Signed   By: Kristine Garbe M.D.   On: 02/16/2018 22:45   Dg Chest  Port 1 View  Result Date: 02/16/2018 CLINICAL DATA:  Fever EXAM: PORTABLE CHEST 1 VIEW COMPARISON:  July 19, 2017 FINDINGS: There is mild scarring in the left lower lobe. There is no edema or consolidation. Heart size and pulmonary vascularity are normal. No adenopathy. There is aortic atherosclerosis. Bones are osteoporotic. There are postoperative changes in the right breast region. There is evidence of an old fracture of the right proximal humerus with remodeling in this area. There is chronic anterior dislocation of the right shoulder. IMPRESSION: Scarring left base region. No edema or consolidation. Stable cardiac silhouette. There is aortic atherosclerosis. Bones osteoporotic. Chronic anterior dislocation right shoulder with evidence of old healed fracture right proximal humerus with remodeling. Postoperative change right breast region. Aortic Atherosclerosis (ICD10-I70.0). Electronically Signed   By: Lowella Grip III M.D.   On: 02/16/2018 19:42   Dg Ercp Biliary & Pancreatic Ducts  Result Date: 02/18/2018 CLINICAL DATA:  Common bile duct stone. Evaluate patency of the CBD. EXAM: ERCP TECHNIQUE: Multiple spot images obtained with the fluoroscopic device and submitted for interpretation post-procedure. COMPARISON:  CT abdomen pelvis-02/17/2018 FINDINGS: Six spot intraoperative fluoroscopic images of the right upper abdominal quadrant during ERCP are provided for review Initial image demonstrates an ERCP probe overlying the right upper abdominal quadrant. There  is suspected cannulation of the common bile duct. Cholecystectomy clips overlies expected location of the gallbladder fossa. Subsequent images demonstrate selective cannulation and opacification of the common bile duct. There are multiple nonocclusive filling defects seen within the CBD worrisome for choledocholithiasis as was suggested on preceding abdominal CT. Subsequent images demonstrate insufflation of a balloon within the CBD with  subsequent biliary sweeping and presumed sphincterotomy. IMPRESSION: ERCP with findings suggestive of choledocholithiasis with subsequent presumed biliary sweeping and sphincterotomy. These images were submitted for radiologic interpretation only. Please see the procedural report for the amount of contrast and the fluoroscopy time utilized. Electronically Signed   By: Sandi Mariscal M.D.   On: 02/18/2018 12:05   Ct Renal Stone Study  Result Date: 02/17/2018 CLINICAL DATA:  83 y/o  F; fever and flank pain. EXAM: CT ABDOMEN AND PELVIS WITHOUT CONTRAST TECHNIQUE: Multidetector CT imaging of the abdomen and pelvis was performed following the standard protocol without IV contrast. COMPARISON:  None. FINDINGS: Lower chest: Small bilateral pleural effusions. Ill-defined bilateral dependent lower lobe opacities may represent atelectasis or pneumonia. 5 mm nodules in the right middle and lower lobes (series 3, image 2 and 4) and 7 mm nodule in the right middle lobe (series 3, image 11). Hepatobiliary: Diffuse intra biliary ductal dilatation. Multiple biliary stones within the common bile duct (series 4, image 51). Cholecystectomy. Pancreas: Unremarkable. No pancreatic ductal dilatation or surrounding inflammatory changes. Spleen: Normal in size without focal abnormality. Adrenals/Urinary Tract: Normal adrenal glands. Normal right kidney and ureter. 6 mm stone in the proximal left ureter with mild left pelvicaliectasis (series 2, image 32). The downstream ureter is also mildly distended, however, no radiopaque stone is identified. Multiple bladder diverticula. Stomach/Bowel: Stomach is within normal limits. Appendix appears normal. No evidence of bowel wall thickening, distention, or inflammatory changes. Vascular/Lymphatic: Aortic atherosclerosis. No enlarged abdominal or pelvic lymph nodes. Reproductive: Uterus and bilateral adnexa are unremarkable. Other: No abdominal wall hernia or abnormality. No abdominopelvic ascites.  Musculoskeletal: Severe T12, mild L3, mild L4 compression deformities. L4-5 grade 1 anterolisthesis. Chronic right proximal femur fracture intramedullary nail and proximal lag screw. 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 IMPRESSION: 1. Intrahepatic biliary ductal dilatation and multiple stones within the common bile duct. 2. Multiple pulmonary nodules measuring up to 7 mm. Non-contrast chest CT at 3-6 months is recommended. If the nodules are stable at time of repeat CT, then future CT at 18-24 months (from today's scan) is considered optional for low-risk patients, but is recommended for high-risk patients. This recommendation follows the consensus statement: Guidelines for Management of Incidental Pulmonary Nodules Detected on CT Images: From the Fleischner Society 2017; Radiology 2017; 284:228-243. 3. Small bilateral pleural effusions and small dependent lower lobe opacities which may represent associated atelectasis or pneumonia. 4. 6 mm stone in left proximal ureter with mild proximal pelvicaliectasis. 5. Chronic appearing T12, L3, L4 compression deformities. Electronically Signed   By: Kristine Garbe M.D.   On: 02/17/2018 03:11   [place] Ekg Site Rite  Result Date: 02/20/2018 If Site Rite image not attached, placement could not be confirmed due to current cardiac rhythm.   Subjective: Without complaints  Discharge Exam: Vitals:   02/21/18 2052 02/22/18 0623  BP: (!) 184/88 128/76  Pulse: 83 89  Resp: 18 18  Temp: 98.1 F (36.7 C) 97.8 F (36.6 C)  SpO2: 95% 98%   Vitals:   02/21/18 1155 02/21/18 1806 02/21/18 2052 02/22/18 0623  BP: [place] Kitchen)  159/94 (!) 157/72 (!) 184/88 128/76  Pulse: 86 75 83 89  Resp: _0 Temp: (!) 97.4 F (36.3 C) 97.6 F (36.4 C) 98.1 F (36.7 C) 97.8 F (36.6 C)  TempSrc: Oral Oral Oral Oral  SpO2: 95% 97% 95% 98%  Weight:      Height:        General: Pt is alert, awake, not  in acute distress Cardiovascular: RRR, S1/S2 +, no rubs, no gallops Respiratory: CTA bilaterally, no wheezing, no rhonchi Abdominal: Soft, NT, ND, bowel sounds + Extremities: no edema, no cyanosis   The results of significant diagnostics from this hospitalization (including imaging, microbiology, ancillary and laboratory) are listed below for reference.     Microbiology: Recent Results (from the past 240 hour(s))  Blood Culture (routine x 2)     Status: Abnormal   Collection Time: 02/16/18  7:15 PM  Result Value Ref Range Status   Specimen Description   Final    LEFT ANTECUBITAL Performed at [facility] [address], [place], [place] [address]    Special Requests   Final    BOTTLES DRAWN AEROBIC AND ANAEROBIC Blood Culture adequate volume Performed at [facility] [address], [place], [place] [address]    Culture  Setup Time   Final    GRAM NEGATIVE RODS IN BOTH AEROBIC AND ANAEROBIC BOTTLES GRAM POSITIVE COCCI AEROBIC BOTTLE ONLY CRITICAL RESULT CALLED TO, READ BACK BY AND VERIFIED WITH: Sheffield Slider PharmD 11:15 02/17/18 ([email or web address])    Culture (A)  Final    ESCHERICHIA COLI Confirmed Extended Spectrum Beta-Lactamase Producer (ESBL).  In bloodstream infections from ESBL organisms, carbapenems are preferred over piperacillin/tazobactam. They are shown to have a lower risk of mortality. STAPHYLOCOCCUS SPECIES (COAGULASE NEGATIVE) THE SIGNIFICANCE OF ISOLATING THIS ORGANISM FROM A SINGLE SET OF BLOOD CULTURES WHEN MULTIPLE SETS ARE DRAWN IS UNCERTAIN. PLEASE NOTIFY THE MICROBIOLOGY DEPARTMENT WITHIN ONE WEEK IF SPECIATION AND SENSITIVITIES ARE REQUIRED. Performed at [facility], [place] [address] [place], [place] [address]    Report Status 02/19/2018 FINAL  Final   Organism ID, Bacteria ESCHERICHIA COLI  Final      Susceptibility   Escherichia coli - MIC*    AMPICILLIN >=32 RESISTANT Resistant     CEFAZOLIN >=64 RESISTANT  Resistant     CEFEPIME RESISTANT Resistant     CEFTAZIDIME >=64 RESISTANT Resistant     CEFTRIAXONE >=64 RESISTANT Resistant     CIPROFLOXACIN >=4 RESISTANT Resistant     GENTAMICIN <=1 SENSITIVE Sensitive     IMIPENEM <=0.25 SENSITIVE Sensitive     TRIMETH/SULFA >=320 RESISTANT Resistant     AMPICILLIN/SULBACTAM >=32 RESISTANT Resistant     PIP/TAZO 8 SENSITIVE Sensitive     Extended ESBL POSITIVE Resistant     * ESCHERICHIA COLI  Blood Culture ID Panel (Reflexed)     Status: Abnormal   Collection Time: 02/16/18  7:15 PM  Result Value Ref Range Status   Enterococcus species NOT DETECTED NOT DETECTED Final   Listeria monocytogenes NOT DETECTED NOT DETECTED Final   Staphylococcus species DETECTED (A) NOT DETECTED Final    Comment: Methicillin (oxacillin) resistant coagulase negative staphylococcus. Possible blood culture contaminant (unless isolated from more than one blood culture draw or clinical case suggests pathogenicity). No antibiotic treatment is indicated for blood  culture contaminants. CRITICAL RESULT CALLED TO, READ BACK BY AND VERIFIED WITH: Sheffield Slider PharmD 11:15 02/17/18 ([email or web address])    Staphylococcus aureus (BCID) NOT DETECTED  NOT DETECTED Final   Methicillin resistance DETECTED (A) NOT DETECTED Final    Comment: CRITICAL RESULT CALLED TO, READ BACK BY AND VERIFIED WITH: Sheffield Slider PharmD 11:15 02/17/18 ([email or web address])    Streptococcus species NOT DETECTED NOT DETECTED Final   Streptococcus agalactiae NOT DETECTED NOT DETECTED Final   Streptococcus pneumoniae NOT DETECTED NOT DETECTED Final   Streptococcus pyogenes NOT DETECTED NOT DETECTED Final   Acinetobacter baumannii NOT DETECTED NOT DETECTED Final   Enterobacteriaceae species DETECTED (A) NOT DETECTED Final    Comment: Enterobacteriaceae represent a large family of gram-negative bacteria, not a single organism. CRITICAL RESULT CALLED TO, READ BACK BY AND VERIFIED WITH: Sheffield Slider PharmD 11:15 02/17/18 ([email or web address])     Enterobacter cloacae complex NOT DETECTED NOT DETECTED Final   Escherichia coli DETECTED (A) NOT DETECTED Final    Comment: CRITICAL RESULT CALLED TO, READ BACK BY AND VERIFIED WITH: Sheffield Slider PharmD 11:15 02/17/18 ([email or web address])    Klebsiella oxytoca NOT DETECTED NOT DETECTED Final   Klebsiella pneumoniae NOT DETECTED NOT DETECTED Final   Proteus species NOT DETECTED NOT DETECTED Final   Serratia marcescens NOT DETECTED NOT DETECTED Final   Carbapenem resistance NOT DETECTED NOT DETECTED Final   Haemophilus influenzae NOT DETECTED NOT DETECTED Final   Neisseria meningitidis NOT DETECTED NOT DETECTED Final   Pseudomonas aeruginosa NOT DETECTED NOT DETECTED Final   Candida albicans NOT DETECTED NOT DETECTED Final   Candida glabrata NOT DETECTED NOT DETECTED Final   Candida krusei NOT DETECTED NOT DETECTED Final   Candida parapsilosis NOT DETECTED NOT DETECTED Final   Candida tropicalis NOT DETECTED NOT DETECTED Final    Comment: Performed at [facility], [place] [address], [place], [place] [address]  Blood Culture (routine x 2)     Status: Abnormal   Collection Time: 02/16/18  7:20 PM  Result Value Ref Range Status   Specimen Description   Final    BLOOD LEFT ANTECUBITAL Performed at [facility] [address], [place], [place] [address]    Special Requests   Final    BOTTLES DRAWN AEROBIC AND ANAEROBIC Blood Culture adequate volume Performed at [facility] [address] [place], [place] [address]    Culture  Setup Time   Final    GRAM NEGATIVE RODS AEROBIC BOTTLE ONLY CRITICAL VALUE NOTED.  VALUE IS CONSISTENT WITH PREVIOUSLY REPORTED AND CALLED VALUE.    Culture (A)  Final    ESCHERICHIA COLI SUSCEPTIBILITIES PERFORMED ON PREVIOUS CULTURE WITHIN THE LAST 5 DAYS. Performed at [facility], [place] [address] [place], [place] [address]    Report Status 02/20/2018 FINAL  Final  MRSA PCR Screening     Status: None   Collection  Time: 02/18/18 12:14 AM  Result Value Ref Range Status   MRSA by PCR NEGATIVE NEGATIVE Final    Comment:        The GeneXpert MRSA Assay (FDA approved for NASAL specimens only), is one component of a comprehensive MRSA colonization surveillance program. It is not intended to diagnose MRSA infection nor to guide or monitor treatment for MRSA infections. Performed at [facility], [place] [address], [place], [place] [address]      Labs: BNP (last 3 results) No results for input(s): BNP in the last 8760 hours. Basic Metabolic Panel: Recent Labs  Lab 02/16/18 2227 02/17/18 0325 02/18/18 0308 02/19/18 0311 02/20/18 0325 02/21/18 0325  NA  --  144 147* 145 147* 146*  K  --  3.4* 3.2* 3.3* 3.7 3.0*  CL  --  108 109 115* 116* 112*  CO2  --  _0 GLUCOSE  --  145* 108* 96 84 87  BUN  --  _1 CREATININE  --  0.68  0.77 0.68 0.54 0.54 0.42*  CALCIUM  --  7.6* 8.0* 7.7* 7.9* 7.6*  MG 2.1  --   --   --   --   --    Liver Function Tests: Recent Labs  Lab 02/16/18 1900 02/17/18 0325 02/19/18 0311 02/20/18 0325 02/21/18 0325  AST 168* 142* 71* 45* 30  ALT 81* 70* 56* 44 33  ALKPHOS 533* 373* 299* 358* 324*  BILITOT 3.4* 3.4* 2.2* 1.9* 1.1  PROT 7.0 5.4* 5.0* 5.0* 4.8*  ALBUMIN 2.8* 2.0* 1.9* 2.1* 1.9*   Recent Labs  Lab 02/17/18 0325  LIPASE 21   Recent Labs  Lab 02/17/18 0435  AMMONIA 25   CBC: Recent Labs  Lab 02/16/18 1915 02/17/18 0435 02/18/18 0308 02/19/18 0311 02/20/18 0325 02/21/18 0325  WBC 18.1* 19.0* 14.6* 9.9 10.8* 7.6  NEUTROABS 16.8* 16.7* 13.2*  --   --   --   HGB 12.5 10.1* 12.1 10.3* 10.4* 10.1*  HCT 42.8 34.9* 40.8 35.6* 35.6* 34.1*  MCV 89.5 90.6 87.9 89.7 90.4 89.0  PLT 265 193 124* 197 232 192   Cardiac Enzymes: Recent Labs  Lab 02/17/18 0325  TROPONINI 0.04*   BNP: Invalid input(s): POCBNP CBG: Recent Labs  Lab 02/21/18 0550 02/21/18 1152 02/21/18 1813 02/22/18 0004  02/22/18 0453  GLUCAP 74 112* 92 117* 126*   D-Dimer No results for input(s): DDIMER in the last 72 hours. Hgb A1c No results for input(s): HGBA1C in the last 72 hours. Lipid Profile No results for input(s): CHOL, HDL, LDLCALC, TRIG, CHOLHDL, LDLDIRECT in the last 72 hours. Thyroid function studies No results for input(s): TSH, T4TOTAL, T3FREE, THYROIDAB in the last 72 hours.  Invalid input(s): FREET3 Anemia work up No results for input(s): VITAMINB12, FOLATE, FERRITIN, TIBC, IRON, RETICCTPCT in the last 72 hours. Urinalysis    Component Value Date/Time   COLORURINE AMBER (A) 02/16/2018 1915   APPEARANCEUR HAZY (A) 02/16/2018 1915   LABSPEC 1.016 02/16/2018 1915   PHURINE 5.0 02/16/2018 1915   GLUCOSEU NEGATIVE 02/16/2018 1915   HGBUR SMALL (A) 02/16/2018 1915   BILIRUBINUR SMALL (A) 02/16/2018 [place] NEGATIVE 02/16/2018 1915   PROTEINUR 30 (A) 02/16/2018 1915   NITRITE NEGATIVE 02/16/2018 1915   LEUKOCYTESUR MODERATE (A) 02/16/2018 1915   Sepsis Labs Invalid input(s): PROCALCITONIN,  WBC,  LACTICIDVEN Microbiology Recent Results (from the past 240 hour(s))  Blood Culture (routine x 2)     Status: Abnormal   Collection Time: 02/16/18  7:15 PM  Result Value Ref Range Status   Specimen Description   Final    LEFT ANTECUBITAL Performed at [facility], [place] [address] [place], [place] [address]    Special Requests   Final    BOTTLES DRAWN AEROBIC AND ANAEROBIC Blood Culture adequate volume Performed at [place] [address], [place],  [address]    Culture  Setup Time   Final    GRAM NEGATIVE RODS IN BOTH AEROBIC AND ANAEROBIC BOTTLES GRAM POSITIVE COCCI AEROBIC BOTTLE ONLY CRITICAL RESULT CALLED TO, READ BACK BY AND VERIFIED WITH: Sheffield Slider PharmD 11:15 02/17/18 ([email or web address])    Culture (A)  Final    ESCHERICHIA  COLI Confirmed Extended Spectrum Beta-Lactamase Producer (ESBL).  In bloodstream infections from  ESBL organisms, carbapenems are preferred over piperacillin/tazobactam. They are shown to have a lower risk of mortality. STAPHYLOCOCCUS SPECIES (COAGULASE NEGATIVE) THE SIGNIFICANCE OF ISOLATING THIS ORGANISM FROM A SINGLE SET OF BLOOD CULTURES WHEN MULTIPLE SETS ARE DRAWN IS UNCERTAIN. PLEASE NOTIFY THE MICROBIOLOGY DEPARTMENT WITHIN ONE WEEK IF SPECIATION AND SENSITIVITIES ARE REQUIRED. Performed at [facility], [place] [address] [place], [place] [address]    Report Status 02/19/2018 FINAL  Final   Organism ID, Bacteria ESCHERICHIA COLI  Final      Susceptibility   Escherichia coli - MIC*    AMPICILLIN >=32 RESISTANT Resistant     CEFAZOLIN >=64 RESISTANT Resistant     CEFEPIME RESISTANT Resistant     CEFTAZIDIME >=64 RESISTANT Resistant     CEFTRIAXONE >=64 RESISTANT Resistant     CIPROFLOXACIN >=4 RESISTANT Resistant     GENTAMICIN <=1 SENSITIVE Sensitive     IMIPENEM <=0.25 SENSITIVE Sensitive     TRIMETH/SULFA >=320 RESISTANT Resistant     AMPICILLIN/SULBACTAM >=32 RESISTANT Resistant     PIP/TAZO 8 SENSITIVE Sensitive     Extended ESBL POSITIVE Resistant     * ESCHERICHIA COLI  Blood Culture ID Panel (Reflexed)     Status: Abnormal   Collection Time: 02/16/18  7:15 PM  Result Value Ref Range Status   Enterococcus species NOT DETECTED NOT DETECTED Final   Listeria monocytogenes NOT DETECTED NOT DETECTED Final   Staphylococcus species DETECTED (A) NOT DETECTED Final    Comment: Methicillin (oxacillin) resistant coagulase negative staphylococcus. Possible blood culture contaminant (unless isolated from more than one blood culture draw or clinical case suggests pathogenicity). No antibiotic treatment is indicated for blood  culture contaminants. CRITICAL RESULT CALLED TO, READ BACK BY AND VERIFIED WITH: Sheffield Slider PharmD 11:15 02/17/18 ([email or web address])    Staphylococcus aureus (BCID) NOT DETECTED NOT DETECTED Final   Methicillin resistance DETECTED (A) NOT DETECTED Final     Comment: CRITICAL RESULT CALLED TO, READ BACK BY AND VERIFIED WITH: Sheffield Slider PharmD 11:15 02/17/18 ([email or web address])    Streptococcus species NOT DETECTED NOT DETECTED Final   Streptococcus agalactiae NOT DETECTED NOT DETECTED Final   Streptococcus pneumoniae NOT DETECTED NOT DETECTED Final   Streptococcus pyogenes NOT DETECTED NOT DETECTED Final   Acinetobacter baumannii NOT DETECTED NOT DETECTED Final   Enterobacteriaceae species DETECTED (A) NOT DETECTED Final    Comment: Enterobacteriaceae represent a large family of gram-negative bacteria, not a single organism. CRITICAL RESULT CALLED TO, READ BACK BY AND VERIFIED WITH: Sheffield Slider PharmD 11:15 02/17/18 ([email or web address])    Enterobacter cloacae complex NOT DETECTED NOT DETECTED Final   Escherichia coli DETECTED (A) NOT DETECTED Final    Comment: CRITICAL RESULT CALLED TO, READ BACK BY AND VERIFIED WITH: Sheffield Slider PharmD 11:15 02/17/18 ([email or web address])    Klebsiella oxytoca NOT DETECTED NOT DETECTED Final   Klebsiella pneumoniae NOT DETECTED NOT DETECTED Final   Proteus species NOT DETECTED NOT DETECTED Final   Serratia marcescens NOT DETECTED NOT DETECTED Final   Carbapenem resistance NOT DETECTED NOT DETECTED Final   Haemophilus influenzae NOT DETECTED NOT DETECTED Final   Neisseria meningitidis NOT DETECTED NOT DETECTED Final   Pseudomonas aeruginosa NOT DETECTED NOT DETECTED Final   Candida albicans NOT DETECTED NOT DETECTED Final   Candida glabrata NOT DETECTED NOT DETECTED Final   Candida krusei NOT DETECTED NOT DETECTED Final   Candida parapsilosis NOT DETECTED NOT DETECTED Final   Candida tropicalis  NOT DETECTED NOT DETECTED Final    Comment: Performed at [facility], [place] [address], [place], [place] [address]  Blood Culture (routine x 2)     Status: Abnormal   Collection Time: 02/16/18  7:20 PM  Result Value Ref Range Status   Specimen Description   Final    BLOOD LEFT ANTECUBITAL Performed at [place] [address] [place], [place] [address]    Special Requests   Final    BOTTLES DRAWN AEROBIC AND ANAEROBIC Blood Culture adequate volume Performed at [place] [address], [place], [place] [address]    Culture  Setup Time   Final    GRAM NEGATIVE RODS AEROBIC BOTTLE ONLY CRITICAL VALUE NOTED.  VALUE IS CONSISTENT WITH PREVIOUSLY REPORTED AND CALLED VALUE.    Culture (A)  Final    ESCHERICHIA COLI SUSCEPTIBILITIES PERFORMED ON PREVIOUS CULTURE WITHIN THE LAST 5 DAYS. Performed at [facility], [place] [address], [place], [place] [address]    Report Status 02/20/2018 FINAL  Final  MRSA PCR Screening     Status: None   Collection Time: 02/18/18 12:14 AM  Result Value Ref Range Status   MRSA by PCR NEGATIVE NEGATIVE Final    Comment:        The GeneXpert MRSA Assay (FDA approved for NASAL specimens only), is one component of a comprehensive MRSA colonization surveillance program. It is not intended to diagnose MRSA infection nor to guide or monitor treatment for MRSA infections. Performed at [facility], [place] [address], [place], [place] [address]    Time spent: 30 min  SIGNED:   Marylu Lund, MD  [facility] 02/22/2018, 10:04 AM  If 7PM-7AM, please contact night-coverage

## 2018-02-22 NOTE — Progress Notes (Signed)
Discharge report called to Ander Purpura, at [facility]. [name] called to transport patient. Dressing changed to sacral area-santyl-wet-dry and foam.

## 2018-02-22 NOTE — Progress Notes (Signed)
PHARMACY CONSULT NOTE FOR:  OUTPATIENT  PARENTERAL ANTIBIOTIC THERAPY (OPAT)  Indication: ESBL bacteremia Regimen: ertapenem 1000 mg IV q24 End date: 02/27/2018  IV antibiotic discharge orders are pended. To discharging provider:  please sign these orders via discharge navigator,  Select New Orders & click on the button choice - Manage This Unsigned Work.     Thank you for allowing pharmacy to be a part of this patient's care.   Royetta Asal, PharmD, BCPS Pager [phone] 02/22/2018 9:30 AM

## 2018-02-23 ENCOUNTER — Encounter (HOSPITAL_COMMUNITY): Payer: Self-pay | Admitting: Gastroenterology

## 2018-02-23 ENCOUNTER — Non-Acute Institutional Stay (SKILLED_NURSING_FACILITY): Payer: Medicare Other | Admitting: Internal Medicine

## 2018-02-23 DIAGNOSIS — E876 Hypokalemia: Secondary | ICD-10-CM | POA: Insufficient documentation

## 2018-02-23 DIAGNOSIS — F419 Anxiety disorder, unspecified: Secondary | ICD-10-CM

## 2018-02-23 DIAGNOSIS — A4102 Sepsis due to Methicillin resistant Staphylococcus aureus: Secondary | ICD-10-CM

## 2018-02-23 DIAGNOSIS — K8001 Calculus of gallbladder with acute cholecystitis with obstruction: Secondary | ICD-10-CM

## 2018-02-23 DIAGNOSIS — G9340 Encephalopathy, unspecified: Secondary | ICD-10-CM

## 2018-02-23 DIAGNOSIS — F339 Major depressive disorder, recurrent, unspecified: Secondary | ICD-10-CM | POA: Diagnosis not present

## 2018-02-23 DIAGNOSIS — R6520 Severe sepsis without septic shock: Secondary | ICD-10-CM

## 2018-02-23 DIAGNOSIS — A4151 Sepsis due to Escherichia coli [E. coli]: Secondary | ICD-10-CM | POA: Diagnosis not present

## 2018-02-23 DIAGNOSIS — L89153 Pressure ulcer of sacral region, stage 3: Secondary | ICD-10-CM | POA: Diagnosis not present

## 2018-02-23 MED ORDER — OXYCODONE-ACETAMINOPHEN 5-325 MG PO TABS: 1.0000 | ORAL_TABLET | Freq: Three times a day (TID) | ORAL | 0 refills | Status: DC

## 2018-02-23 MED ORDER — ALPRAZOLAM 0.5 MG PO TABS: 0.5000 mg | ORAL_TABLET | Freq: Two times a day (BID) | ORAL | 1 refills | Status: DC

## 2018-02-23 NOTE — Progress Notes (Signed)
:  Location:  [facility] Room Number: 211D Place of Service:  SNF (31)  [name], MD  [name], [name]  Extended Emergency Contact Information Primary Emergency Contact: [name] Address: [address]          [place], [place] [address] [name] of [place] Phone: [phone] Relation: Son Secondary Emergency Contact: [name],[name]          [place], [place] Relation: Sister     Allergies: Demerol [meperidine]  Chief Complaint  Patient presents with  . New Admit To SNF    Admit to [facility]    HPI: Patient is 83 y.o. female with history of TBI, diastolic heart failure, anxiety, depression, and polyneuropathy who presented to [facility] emergency department with altered mental status hypotension confusion and disorientation.  In the ED her blood pressure was 88/64, heart rate 94 respiratory rate 14 O2 saturation 97%, lungs were clear.  Sodium 148 potassium 2.8 chloride 103 creatinine 1.0 alkaline phosphatase 553, AST 168 ALT 81, total bili 3.4 troponin 0 0.04, lactic acid 2.9, white blood cell count 18.1.  CT with intrahepatic biliary duct dilatation with multiple stones in the common bile duct.  Chest x-ray with no infiltrates patient was admitted to [facility] from 1/13-17 where she was treated for sepsis secondary to common bile duct obstruction.  Patient's blood cultures were positive for E. coli and MRSA.  Patient was on empiric Rocephin and vancomycin, changed over to meropenem on 1/15.  Patient underwent an emergent ERCP with findings of extensive choledocholithiasis with purulence.  Anticipating total 10 days of IV antibiotics plan to complete course of ertapenem through 1/24 dose.  Patient is admitted to skilled nursing facility for continued IV antibiotics, OT/PT and residential care.  While at skilled nursing facility patient  will be followed for depression treated with Lexapro Remeron and Effexor, neuropathy treated with Neurontin and anxiety treated with Xanax.  Past Medical History:  Diagnosis Date  . Anasarca 05/21/2016  . Anxiety 11/03/2016  . Closed TBI (traumatic brain injury) ([place]) 04/21/2016  . Dysphagia 07/13/2016  . Gout   . Hypertension   . Obesity (BMI 30.0-34.9) 05/21/2016  . Status post insertion of percutaneous endoscopic gastrostomy (PEG) tube ([place]) 04/21/2016    Past Surgical History:  Procedure Laterality Date  . CHOLECYSTECTOMY N/A 07/21/2017   Procedure: LAPAROSCOPIC CHOLECYSTECTOMY;  Surgeon: Erroll Luna, MD;  Location: [facility];  Service: General;  Laterality: N/A;  . ERCP N/A 02/18/2018   Procedure: ENDOSCOPIC RETROGRADE CHOLANGIOPANCREATOGRAPHY (ERCP);  Surgeon: Milus Banister, MD;  Location: [facility];  Service: Endoscopy;  Laterality: N/A;  . INTRAMEDULLARY (IM) NAIL INTERTROCHANTERIC Right 12/27/2016   Procedure: INTRAMEDULLARY (IM) NAIL INTERTROCHANTRIC RIGHT HIP;  Surgeon: Paralee Cancel, MD;  Location: [facility];  Service: Orthopedics;  Laterality: Right;  . MASTECTOMY    . REMOVAL OF STONES  02/18/2018   Procedure: REMOVAL OF STONES;  Surgeon: Milus Banister, MD;  Location: [facility];  Service: Endoscopy;;  balloon sweep  . SPHINCTEROTOMY  02/18/2018   Procedure: SPHINCTEROTOMY;  Surgeon: Milus Banister, MD;  Location: [facility];  Service: Endoscopy;;    Allergies as of 02/23/2018      Reactions   Demerol [meperidine] Swelling      Medication List       Accurate as of February 23, 2018  9:53 AM. Always use your most recent  med list.        ALPRAZolam 0.5 MG tablet Commonly known as:  XANAX Take 1 tablet (0.5 mg total) by mouth 2 (two) times daily. Prescription required for discharge to SNF   bimatoprost 0.01 % Soln Commonly known as:  LUMIGAN Place 1 drop into both eyes at bedtime. For glaucoma   bisacodyl 10 MG suppository Commonly known as:   DULCOLAX Place 10 mg rectally daily as needed (constipation not relieved by MOM).   ENSURE Take 237 mLs by mouth. TID DUE TO WEIGHT LOSS ( CHOCOLATE IF AVAILABLE )   ertapenem  IVPB Commonly known as:  INVANZ Inject 1 g into the vein daily for 5 days. Indication:  ESBL bacteremia Last Day of Therapy:  1/24/202 Labs - Once weekly:  CBC/D and BMP, Labs - Every other week:  ESR and CRP   escitalopram 5 MG tablet Commonly known as:  LEXAPRO Take 5 mg by mouth daily.   feeding supplement (PRO-STAT SUGAR FREE 64) Liqd Take 30 mLs by mouth 2 (two) times daily.   FLEET ENEMA RE Place 1 each rectally daily as needed (constipation not relieved by bisacodyl suppository).   furosemide 20 MG tablet Commonly known as:  LASIX Take 20 mg by mouth daily.   gabapentin 600 MG tablet Commonly known as:  NEURONTIN Take 600 mg by mouth 3 (three) times daily.   MILK OF MAGNESIA PO Take 30 mLs by mouth daily as needed (if no BM in 3 days).   mirtazapine 7.5 MG tablet Commonly known as:  REMERON Take 7.5 mg by mouth. TAKE 1 TABLET BY MOUTH AT BEDTIME FOR APPETITE STIMULANT   MULTIPLE VITAMINS-MINERALS PO Take by mouth. TAKE 1 TABLET BY MOUTH ONCE DAILY TO PROMOTE WOUND HEALING   oxyCODONE-acetaminophen 5-325 MG tablet Commonly known as:  PERCOCET/ROXICET Take 1 tablet by mouth every 8 (eight) hours. for pain  Prescription required for discharge to SNF   sennosides-docusate sodium 8.6-50 MG tablet Commonly known as:  SENOKOT-S Take 2 tablets by mouth 2 (two) times daily as needed for constipation.   venlafaxine XR 75 MG 24 hr capsule Commonly known as:  EFFEXOR-XR Take 75 mg by mouth 2 (two) times daily. FOR DEPRESSION (DO NOT CRUSH)       No orders of the defined types were placed in this encounter.   Immunization History  Administered Date(s) Administered  . Influenza-Unspecified 12/19/2016  . PPD Test 04/29/2016  . Pneumococcal Conjugate-13 08/06/2016  . Pneumococcal  Polysaccharide-23 04/15/2016  . Tdap 03/31/2016    Social History   Tobacco Use  . Smoking status: Never Smoker  . Smokeless tobacco: Never Used  Substance Use Topics  . Alcohol use: Not Currently    Family history is   Family History  Problem Relation Age of Onset  . Asthma Mother   . Anesthesia problems Neg Hx   . Arthritis Neg Hx   . Cancer Neg Hx   . Cerebral palsy Neg Hx   . Clotting disorder Neg Hx   . Club foot Neg Hx   . Collagen disease Neg Hx   . Deep vein thrombosis Neg Hx   . Gait disorder Neg Hx   . Heart disease Neg Hx   . Gout Neg Hx   . Diabetes Neg Hx   . Hip dysplasia Neg Hx   . Hip fracture Neg Hx   . Hypermobility Neg Hx   . Hypertension Neg Hx   . Osteoporosis Neg Hx   . Other  Neg Hx   . Stroke Neg Hx   . Thyroid disease Neg Hx   . Vasculitis Neg Hx   . Spina bifida Neg Hx   . Scoliosis Neg Hx   . Rheumatologic disease Neg Hx   . Pulmonary embolism Neg Hx       Review of Systems  DATA OBTAINED: from nurse GENERAL:  no fevers, fatigue, appetite changes SKIN: No itching, or rash EYES: No eye pain, redness, discharge EARS: No earache, tinnitus, change in hearing NOSE: No congestion, drainage or bleeding  MOUTH/THROAT: No mouth or tooth pain, No sore throat RESPIRATORY: No cough, wheezing, SOB CARDIAC: No chest pain, palpitations, lower extremity edema  GI: No abdominal pain, No N/V/D or constipation, No heartburn or reflux  GU: No dysuria, frequency or urgency, or incontinence  MUSCULOSKELETAL: No unrelieved bone/joint pain NEUROLOGIC: No headache, dizziness or focal weakness;  PSYCHIATRIC: No c/o anxiety or sadness   Vitals:   02/23/18 0952  BP: 128/74  Pulse: 72  Resp: 18  Temp: 98.2 F (36.8 C)    SpO2 Readings from Last 1 Encounters:  02/22/18 96%   Body mass index is 27.46 kg/m.     Physical Exam  GENERAL APPEARANCE: Alert, conversant,  No acute distress.  SKIN: No diaphoresis rash HEAD: Normocephalic,  atraumatic  EYES: Conjunctiva/lids clear. Pupils round, reactive. EOMs intact.  EARS: External exam WNL, canals clear. Hearing grossly normal.  NOSE: No deformity or discharge.  MOUTH/THROAT: Lips w/o lesions  RESPIRATORY: Breathing is even, unlabored. Lung sounds are clear   CARDIOVASCULAR: Heart RRR no murmurs, rubs or gallops. No peripheral edema.   GASTROINTESTINAL: Abdomen is soft, non-tender, not distended w/ normal bowel sounds. GENITOURINARY: Bladder non tender, not distended  MUSCULOSKELETAL: No abnormal joints or musculature NEUROLOGIC:  Cranial nerves 2-12 grossly intact. Moves all extremities  PSYCHIATRIC: Responds to name, otherwise very confused, no behavioral issues  Patient Active Problem List   Diagnosis Date Noted  . Bile duct stone   . Sepsis ([place]) 02/17/2018  . Cholecystitis 07/31/2017  . Cholecystitis, acute with cholelithiasis 07/20/2017  . Pressure injury of skin 07/20/2017  . History of total right hip arthroplasty 01/05/2017  . Acute blood loss as cause of postoperative anemia 01/05/2017  . Escherichia coli urinary tract infection 01/05/2017  . Acute encephalopathy 01/05/2017  . Depression, recurrent ([place]) 01/05/2017  . Polyneuropathy 01/05/2017  . Closed right hip fracture ([place]) 12/25/2016  . Anxiety 11/03/2016  . Dysphagia 07/13/2016  . Anasarca 05/21/2016  . Obesity (BMI 30.0-34.9) 05/21/2016  . Closed TBI (traumatic brain injury) ([place]) 04/21/2016  . Status post insertion of percutaneous endoscopic gastrostomy (PEG) tube ([place]) 04/21/2016  . Facial laceration 04/21/2016  . Cheek wound, right, subsequent encounter 04/21/2016  . Urinary tract infection due to extended-spectrum beta lactamase (ESBL) producing Escherichia coli 04/21/2016  . Hypotension 04/21/2016  . Gout   . Hypertension       Labs reviewed: Basic Metabolic Panel:    Component Value Date/Time   NA 146 (H) 02/21/2018 0325   NA 145 07/28/2017   K 3.0 (L) 02/21/2018 0325   K 3.7  07/28/2017   CL 112 (H) 02/21/2018 0325   CO2 25 02/21/2018 0325   GLUCOSE 87 02/21/2018 0325   BUN 8 02/21/2018 0325   BUN 12 07/28/2017   CREATININE 0.42 (L) 02/21/2018 0325   CREATININE 0.39 07/28/2017   CALCIUM 7.6 (L) 02/21/2018 0325   CALCIUM 8.1 07/28/2017   PROT 4.8 (L) 02/21/2018 0325   ALBUMIN 1.9 (  L) 02/21/2018 0325   AST 30 02/21/2018 0325   ALT 33 02/21/2018 0325   ALKPHOS 324 (H) 02/21/2018 0325   BILITOT 1.1 02/21/2018 0325   GFRNONAA >60 02/21/2018 0325   GFRNONAA >90 07/28/2017   GFRAA >60 02/21/2018 0325    Recent Labs    02/16/18 2227  02/19/18 0311 02/20/18 0325 02/21/18 0325  NA  --    < > 145 147* 146*  K  --    < > 3.3* 3.7 3.0*  CL  --    < > 115* 116* 112*  CO2  --    < > 24 24 25   GLUCOSE  --    < > 96 84 87  BUN  --    < > 12 10 8   CREATININE  --    < > 0.54 0.54 0.42*  CALCIUM  --    < > 7.7* 7.9* 7.6*  MG 2.1  --   --   --   --    < > = values in this interval not displayed.   Liver Function Tests: Recent Labs    02/19/18 0311 02/20/18 0325 02/21/18 0325  AST 71* 45* 30  ALT 56* 44 33  ALKPHOS 299* 358* 324*  BILITOT 2.2* 1.9* 1.1  PROT 5.0* 5.0* 4.8*  ALBUMIN 1.9* 2.1* 1.9*   Recent Labs    02/17/18 0325  LIPASE 21   Recent Labs    02/17/18 0435  AMMONIA 25   CBC: Recent Labs    02/16/18 1915 02/17/18 0435 02/18/18 0308 02/19/18 0311 02/20/18 0325 02/21/18 0325  WBC 18.1* 19.0* 14.6* 9.9 10.8* 7.6  NEUTROABS 16.8* 16.7* 13.2*  --   --   --   HGB 12.5 10.1* 12.1 10.3* 10.4* 10.1*  HCT 42.8 34.9* 40.8 35.6* 35.6* 34.1*  MCV 89.5 90.6 87.9 89.7 90.4 89.0  PLT 265 193 124* 197 232 192   Lipid No results for input(s): CHOL, HDL, LDLCALC, TRIG in the last 8760 hours.  Cardiac Enzymes: Recent Labs    02/17/18 0325  TROPONINI 0.04*   BNP: No results for input(s): BNP in the last 8760 hours. No results found for: MICROALBUR No results found for: HGBA1C Lab Results  Component Value Date   TSH 1.68  08/06/2013   No results found for: VITAMINB12 No results found for: FOLATE No results found for: IRON, TIBC, FERRITIN  Imaging and Procedures obtained prior to SNF admission: Ct Head Wo Contrast  Result Date: 02/17/2018 CLINICAL DATA:  Fever and altered mental status for 1 day. EXAM: CT HEAD WITHOUT CONTRAST TECHNIQUE: Contiguous axial images were obtained from the base of the skull through the vertex without intravenous contrast. COMPARISON:  10/18/2017 FINDINGS: Brain: Patient positioning limits examination. Area of encephalomalacia demonstrated in the right frontal lobe without change since prior study. Mild diffuse cerebral atrophy. Ventricular dilatation, partially related to encephalomalacia. Low-attenuation changes in the deep white matter likely represent small vessel ischemia. No change in appearance of intracranial contents since previous study. No acute mass effect or abnormal extra-axial fluid collections. No acute intracranial hemorrhage. Vascular: Intracranial arterial vascular calcifications are present in the vertebrobasilar and carotid arteries. Skull: Focal cranial ostomy in the right anterior frontal region. No acute depressed skull fractures identified. Sinuses/Orbits: Opacification of a posterior right ethmoid air cells. Paranasal sinuses are otherwise clear. Mastoid air cells are clear. No change since prior study. Other: None. IMPRESSION: No acute intracranial abnormalities. Chronic atrophy and small vessel ischemic changes. Area of old  encephalomalacia in the right frontal lobe unchanged. Electronically Signed   By: Lucienne Capers M.D.   On: 02/17/2018 03:06   US Abdomen Limited  Result Date: 02/16/2018 CLINICAL DATA:  83 y/o F; abdominal pain and abnormal liver function test. EXAM: ULTRASOUND ABDOMEN LIMITED RIGHT UPPER QUADRANT COMPARISON:  None. FINDINGS: Gallbladder: Not identified. Common bile duct: Diameter: 7 mm Liver: No focal lesion identified. Within normal limits in  parenchymal echogenicity. Portal vein is patent on color Doppler imaging with normal direction of blood flow towards the liver. IMPRESSION: Gallbladder not identified. Otherwise unremarkable right upper quadrant ultrasound. Electronically Signed   By: Kristine Garbe M.D.   On: 02/16/2018 22:45   Dg Chest Port 1 View  Result Date: 02/16/2018 CLINICAL DATA:  Fever EXAM: PORTABLE CHEST 1 VIEW COMPARISON:  July 19, 2017 FINDINGS: There is mild scarring in the left lower lobe. There is no edema or consolidation. Heart size and pulmonary vascularity are normal. No adenopathy. There is aortic atherosclerosis. Bones are osteoporotic. There are postoperative changes in the right breast region. There is evidence of an old fracture of the right proximal humerus with remodeling in this area. There is chronic anterior dislocation of the right shoulder. IMPRESSION: Scarring left base region. No edema or consolidation. Stable cardiac silhouette. There is aortic atherosclerosis. Bones osteoporotic. Chronic anterior dislocation right shoulder with evidence of old healed fracture right proximal humerus with remodeling. Postoperative change right breast region. Aortic Atherosclerosis (ICD10-I70.0). Electronically Signed   By: Lowella Grip III M.D.   On: 02/16/2018 19:42   Ct Renal Stone Study  Result Date: 02/17/2018 CLINICAL DATA:  83 y/o  F; fever and flank pain. EXAM: CT ABDOMEN AND PELVIS WITHOUT CONTRAST TECHNIQUE: Multidetector CT imaging of the abdomen and pelvis was performed following the standard protocol without IV contrast. COMPARISON:  None. FINDINGS: Lower chest: Small bilateral pleural effusions. Ill-defined bilateral dependent lower lobe opacities may represent atelectasis or pneumonia. 5 mm nodules in the right middle and lower lobes (series 3, image 2 and 4) and 7 mm nodule in the right middle lobe (series 3, image 11). Hepatobiliary: Diffuse intra biliary ductal dilatation. Multiple biliary  stones within the common bile duct (series 4, image 51). Cholecystectomy. Pancreas: Unremarkable. No pancreatic ductal dilatation or surrounding inflammatory changes. Spleen: Normal in size without focal abnormality. Adrenals/Urinary Tract: Normal adrenal glands. Normal right kidney and ureter. 6 mm stone in the proximal left ureter with mild left pelvicaliectasis (series 2, image 32). The downstream ureter is also mildly distended, however, no radiopaque stone is identified. Multiple bladder diverticula. Stomach/Bowel: Stomach is within normal limits. Appendix appears normal. No evidence of bowel wall thickening, distention, or inflammatory changes. Vascular/Lymphatic: Aortic atherosclerosis. No enlarged abdominal or pelvic lymph nodes. Reproductive: Uterus and bilateral adnexa are unremarkable. Other: No abdominal wall hernia or abnormality. No abdominopelvic ascites. Musculoskeletal: Severe T12, mild L3, mild L4 compression deformities. L4-5 grade 1 anterolisthesis. Chronic right proximal femur fracture intramedullary nail and proximal lag screw. 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 IMPRESSION: 1. Intrahepatic biliary ductal dilatation and multiple stones within the common bile duct. 2. Multiple pulmonary nodules measuring up to 7 mm. Non-contrast chest CT at 3-6 months is recommended. If the nodules are stable at time of repeat CT, then future CT at 18-24 months (from today's scan) is considered optional for low-risk patients, but is recommended for high-risk patients. This recommendation follows the consensus statement: Guidelines for Management of Incidental Pulmonary Nodules Detected  on CT Images: From the Fleischner Society 2017; Radiology 2017; 284:228-243. 3. Small bilateral pleural effusions and small dependent lower lobe opacities which may represent associated atelectasis or pneumonia. 4. 6 mm stone in left proximal ureter with mild  proximal pelvicaliectasis. 5. Chronic appearing T12, L3, L4 compression deformities. Electronically Signed   By: Kristine Garbe M.D.   On: 02/17/2018 03:11     Not all labs, radiology exams or other studies done during hospitalization come through on my EPIC note; however they are reviewed by me.    Assessment and Plan  E. coli and MRSA sepsis/CBD obstruction-initially treated with Rocephin and vancomycin then meropenem on 1/15; urgent ERCP with findings of extensive choledocholithiasis purulence; 10 days of IV antibiotics to complete a course of ertapenem through 1/24; LFTs and CBC improving SNF- admitted for OT/PT, residential care, and IV ertapenem through 1/24 for choledocholithiasis with purulence  Hypokalemia SNF-repleted; follow-up BMP  Metabolic encephalopathy- reported to be improved with IV antibiotics and IV fluid SNF- patient is still extremely confused will monitor  Depression SNF- basically stable; continue Effexor Exar 75 mg twice daily and Lexapro 5 mg daily  Anxiety SNF- continue Xanax 0.5 mg twice daily  Glaucoma SNF- continue Lumigan 0.01% 1 drop in both eyes at bedtime    Spent greater than 45 minutes;> 50% of time with patient was spent reviewing records, labs, tests and studies, counseling and developing plan of care  Webb Silversmith D. [name], MD

## 2018-03-02 DIAGNOSIS — L89153 Pressure ulcer of sacral region, stage 3: Secondary | ICD-10-CM | POA: Diagnosis not present

## 2018-03-02 LAB — CBC AND DIFFERENTIAL
HCT: 34 — AB (ref 36–46)
Hemoglobin: 11.1 — AB (ref 12.0–16.0)
Platelets: 192 (ref 150–399)
WBC: 7.2

## 2018-03-02 LAB — POCT ERYTHROCYTE SEDIMENTATION RATE, NON-AUTOMATED: Sed Rate: 34

## 2018-03-02 LAB — BASIC METABOLIC PANEL
BUN: 8 (ref 4–21)
Creatinine: 0.5 (ref 0.5–1.1)
Glucose: 67
POTASSIUM: 3.4 (ref 3.4–5.3)
Sodium: 151 — AB (ref 137–147)

## 2018-03-09 ENCOUNTER — Other Ambulatory Visit: Payer: Self-pay

## 2018-03-09 DIAGNOSIS — L89153 Pressure ulcer of sacral region, stage 3: Secondary | ICD-10-CM | POA: Diagnosis not present

## 2018-03-09 MED ORDER — OXYCODONE-ACETAMINOPHEN 5-325 MG PO TABS: 1.0000 | ORAL_TABLET | Freq: Three times a day (TID) | ORAL | 0 refills | Status: DC

## 2018-03-11 ENCOUNTER — Encounter: Payer: Self-pay | Admitting: Internal Medicine

## 2018-03-11 ENCOUNTER — Non-Acute Institutional Stay (SKILLED_NURSING_FACILITY): Payer: Medicare Other | Admitting: Internal Medicine

## 2018-03-11 DIAGNOSIS — E870 Hyperosmolality and hypernatremia: Secondary | ICD-10-CM

## 2018-03-11 DIAGNOSIS — I10 Essential (primary) hypertension: Secondary | ICD-10-CM | POA: Diagnosis not present

## 2018-03-11 DIAGNOSIS — E559 Vitamin D deficiency, unspecified: Secondary | ICD-10-CM | POA: Diagnosis not present

## 2018-03-11 DIAGNOSIS — E039 Hypothyroidism, unspecified: Secondary | ICD-10-CM | POA: Diagnosis not present

## 2018-03-11 DIAGNOSIS — E119 Type 2 diabetes mellitus without complications: Secondary | ICD-10-CM | POA: Diagnosis not present

## 2018-03-11 DIAGNOSIS — E785 Hyperlipidemia, unspecified: Secondary | ICD-10-CM | POA: Diagnosis not present

## 2018-03-11 NOTE — Progress Notes (Signed)
:  Location:  [facility] Room Number: 211D Place of Service:  SNF (31)  Autumn Bryant. [name], MD  [name], [name]  Extended Emergency Contact Information Primary Emergency Contact: [name] Address: [address]          [place], [place] [address] [name] of [place] Phone: [phone] Relation: Son Secondary Emergency Contact: [name],[name]          [employer], [place] Relation: Sister     Allergies: Demerol [meperidine]  Chief Complaint  Patient presents with  . Acute Visit    Hypernatremia    HPI: Patient is 83 y.o. female who is being seen for routine lab returned with a sodium of 155.  Patient is awake and alert and talking but she had had decreased p.o. intake in the last few days and 120 cc of water every 2 hours 8 times while patient awake was started yesterday.  Patient has had no fever chills nausea vomiting, cold cough or complains of urine symptoms.  Past Medical History:  Diagnosis Date  . Anasarca 05/21/2016  . Anxiety 11/03/2016  . Closed TBI (traumatic brain injury) ([place]) 04/21/2016  . Dysphagia 07/13/2016  . Gout   . Hypertension   . Obesity (BMI 30.0-34.9) 05/21/2016  . Status post insertion of percutaneous endoscopic gastrostomy (PEG) tube ([place]) 04/21/2016    Past Surgical History:  Procedure Laterality Date  . CHOLECYSTECTOMY N/A 07/21/2017   Procedure: LAPAROSCOPIC CHOLECYSTECTOMY;  Surgeon: Erroll Luna, MD;  Location: [facility];  Service: General;  Laterality: N/A;  . ERCP N/A 02/18/2018   Procedure: ENDOSCOPIC RETROGRADE CHOLANGIOPANCREATOGRAPHY (ERCP);  Surgeon: Milus Banister, MD;  Location: [facility];  Service: Endoscopy;  Laterality: N/A;  . INTRAMEDULLARY (IM) NAIL INTERTROCHANTERIC Right 12/27/2016   Procedure: INTRAMEDULLARY (IM) NAIL INTERTROCHANTRIC RIGHT HIP;  Surgeon: Paralee Cancel, MD;  Location: [facility];   Service: Orthopedics;  Laterality: Right;  . MASTECTOMY    . REMOVAL OF STONES  02/18/2018   Procedure: REMOVAL OF STONES;  Surgeon: Milus Banister, MD;  Location: [facility];  Service: Endoscopy;;  balloon sweep  . SPHINCTEROTOMY  02/18/2018   Procedure: SPHINCTEROTOMY;  Surgeon: Milus Banister, MD;  Location: [facility];  Service: Endoscopy;;    Allergies as of 03/11/2018      Reactions   Demerol [meperidine] Swelling      Medication List       Accurate as of March 11, 2018  4:07 PM. Always use your most recent med list.        ALPRAZolam 0.5 MG tablet Commonly known as:  XANAX Take 1 tablet (0.5 mg total) by mouth 2 (two) times daily.   bimatoprost 0.01 % Soln Commonly known as:  LUMIGAN Place 1 drop into both eyes at bedtime. For glaucoma   bisacodyl 10 MG suppository Commonly known as:  DULCOLAX Place 10 mg rectally daily as needed (constipation not relieved by MOM).   ENSURE Take 237 mLs by mouth. TID DUE TO WEIGHT LOSS ( CHOCOLATE IF AVAILABLE )   escitalopram 5 MG tablet Commonly known as:  LEXAPRO Take 5 mg by mouth daily.   feeding supplement (PRO-STAT SUGAR FREE 64) Liqd Take 30 mLs by mouth 2 (two) times daily.   FLEET ENEMA RE Place 1 each rectally daily as needed (constipation not relieved by bisacodyl suppository).   furosemide 20 MG tablet Commonly known as:  LASIX Take  20 mg by mouth daily.   gabapentin 600 MG tablet Commonly known as:  NEURONTIN Take 600 mg by mouth 3 (three) times daily.   MILK OF MAGNESIA PO Take 30 mLs by mouth daily as needed (if no BM in 3 days).   mirtazapine 7.5 MG tablet Commonly known as:  REMERON Take 7.5 mg by mouth. TAKE 1 TABLET BY MOUTH AT BEDTIME FOR APPETITE STIMULANT   MULTIPLE VITAMINS-MINERALS PO Take by mouth. TAKE 1 TABLET BY MOUTH ONCE DAILY TO PROMOTE WOUND HEALING   oxyCODONE-acetaminophen 5-325 MG tablet Commonly known as:  PERCOCET/ROXICET Take 1 tablet by mouth every 8 (eight) hours.    sennosides-docusate sodium 8.6-50 MG tablet Commonly known as:  SENOKOT-S Take 2 tablets by mouth 2 (two) times daily as needed for constipation.   venlafaxine XR 75 MG 24 hr capsule Commonly known as:  EFFEXOR-XR Take 75 mg by mouth 2 (two) times daily. FOR DEPRESSION (DO NOT CRUSH)       No orders of the defined types were placed in this encounter.   Immunization History  Administered Date(s) Administered  . Influenza-Unspecified 12/19/2016  . PPD Test 04/29/2016  . Pneumococcal Conjugate-13 08/06/2016  . Pneumococcal Polysaccharide-23 04/15/2016  . Tdap 03/31/2016    Social History   Tobacco Use  . Smoking status: Never Smoker  . Smokeless tobacco: Never Used  Substance Use Topics  . Alcohol use: Not Currently    Family history is   Family History  Problem Relation Age of Onset  . Asthma Mother   . Anesthesia problems Neg Hx   . Arthritis Neg Hx   . Cancer Neg Hx   . Cerebral palsy Neg Hx   . Clotting disorder Neg Hx   . Club foot Neg Hx   . Collagen disease Neg Hx   . Deep vein thrombosis Neg Hx   . Gait disorder Neg Hx   . Heart disease Neg Hx   . Gout Neg Hx   . Diabetes Neg Hx   . Hip dysplasia Neg Hx   . Hip fracture Neg Hx   . Hypermobility Neg Hx   . Hypertension Neg Hx   . Osteoporosis Neg Hx   . Other Neg Hx   . Stroke Neg Hx   . Thyroid disease Neg Hx   . Vasculitis Neg Hx   . Spina bifida Neg Hx   . Scoliosis Neg Hx   . Rheumatologic disease Neg Hx   . Pulmonary embolism Neg Hx       Review of Systems  DATA OBTAINED: from patient-limited; nursing- patient has been drinking really really good today GENERAL:  no fevers, fatigue, appetite changes SKIN: No itching, or rash EYES: No eye pain, redness, discharge EARS: No earache, tinnitus, change in hearing NOSE: No congestion, drainage or bleeding  MOUTH/THROAT: No mouth or tooth pain, No sore throat RESPIRATORY: No cough, wheezing, SOB CARDIAC: No chest pain, palpitations, lower  extremity edema  GI: No abdominal pain, No N/V/D or constipation, No heartburn or reflux  GU: No dysuria, frequency or urgency, or incontinence  MUSCULOSKELETAL: No unrelieved bone/joint pain NEUROLOGIC: No headache, dizziness or focal weakness PSYCHIATRIC: No c/o anxiety or sadness   Vitals:   03/11/18 1603  BP: 110/63  Pulse: 85  Resp: 18  Temp: 98.1 F (36.7 C)    SpO2 Readings from Last 1 Encounters:  02/22/18 96%   Body mass index is 27.46 kg/m.     Physical Exam  GENERAL APPEARANCE: Alert, conversant,  No acute distress.  SKIN: No diaphoresis rash HEAD: Normocephalic, atraumatic  EYES: Conjunctiva/lids clear. Pupils round, reactive. EOMs intact.  EARS: External exam WNL, canals clear. Hearing grossly normal.  NOSE: No deformity or discharge.  MOUTH/THROAT: Lips w/o lesions  RESPIRATORY: Breathing is even, unlabored. Lung sounds are clear   CARDIOVASCULAR: Heart RRR no murmurs, rubs or gallops. No peripheral edema.   GASTROINTESTINAL: Abdomen is soft, non-tender, not distended w/ normal bowel sounds. GENITOURINARY: Bladder non tender, not distended  MUSCULOSKELETAL: No abnormal joints or musculature NEUROLOGIC:  Cranial nerves 2-12 grossly intact. Moves all extremities  PSYCHIATRIC: Mood and affect appropriate to situation, patient is status post TBI with some dementia no behavioral issues  Patient Active Problem List   Diagnosis Date Noted  . MRSA (methicillin resistant Staphylococcus aureus) septicemia ([facility]) 02/23/2018  . Hypokalemia 02/23/2018  . Bile duct stone   . E. coli sepsis ([facility]) 02/17/2018  . Cholecystitis 07/31/2017  . Cholecystitis, acute with cholelithiasis 07/20/2017  . Pressure injury of skin 07/20/2017  . History of total right hip arthroplasty 01/05/2017  . Acute blood loss as cause of postoperative anemia 01/05/2017  . Escherichia coli urinary tract infection 01/05/2017  . Acute encephalopathy 01/05/2017  . Depression, recurrent ([place])  01/05/2017  . Polyneuropathy 01/05/2017  . Closed right hip fracture ([place]) 12/25/2016  . Anxiety 11/03/2016  . Dysphagia 07/13/2016  . Anasarca 05/21/2016  . Obesity (BMI 30.0-34.9) 05/21/2016  . Closed TBI (traumatic brain injury) ([facility]) 04/21/2016  . Status post insertion of percutaneous endoscopic gastrostomy (PEG) tube ([place]) 04/21/2016  . Facial laceration 04/21/2016  . Cheek wound, right, subsequent encounter 04/21/2016  . Urinary tract infection due to extended-spectrum beta lactamase (ESBL) producing Escherichia coli 04/21/2016  . Hypotension 04/21/2016  . Gout   . Hypertension       Labs reviewed: Basic Metabolic Panel:    Component Value Date/Time   NA 151 (A) 03/02/2018   NA 145 07/28/2017   K 3.4 03/02/2018   K 3.7 07/28/2017   CL 112 (H) 02/21/2018 0325   CO2 25 02/21/2018 0325   GLUCOSE 87 02/21/2018 0325   BUN 8 03/02/2018   CREATININE 0.5 03/02/2018   CREATININE 0.42 (L) 02/21/2018 0325   CREATININE 0.39 07/28/2017   CALCIUM 7.6 (L) 02/21/2018 0325   CALCIUM 8.1 07/28/2017   PROT 4.8 (L) 02/21/2018 0325   ALBUMIN 1.9 (L) 02/21/2018 0325   AST 30 02/21/2018 0325   ALT 33 02/21/2018 0325   ALKPHOS 324 (H) 02/21/2018 0325   BILITOT 1.1 02/21/2018 0325   GFRNONAA >60 02/21/2018 0325   GFRNONAA >90 07/28/2017   GFRAA >60 02/21/2018 0325    Recent Labs    02/16/18 2227  02/19/18 0311 02/20/18 0325 02/21/18 0325 03/02/18  NA  --    < > 145 147* 146* 151*  K  --    < > 3.3* 3.7 3.0* 3.4  CL  --    < > 115* 116* 112*  --   CO2  --    < > 24 24 25   --   GLUCOSE  --    < > 96 84 87  --   BUN  --    < > 12 10 8 8   CREATININE  --    < > 0.54 0.54 0.42* 0.5  CALCIUM  --    < > 7.7* 7.9* 7.6*  --   MG 2.1  --   --   --   --   --    < > =  values in this interval not displayed.   Liver Function Tests: Recent Labs    02/19/18 0311 02/20/18 0325 02/21/18 0325  AST 71* 45* 30  ALT 56* 44 33  ALKPHOS 299* 358* 324*  BILITOT 2.2* 1.9* 1.1  PROT 5.0*  5.0* 4.8*  ALBUMIN 1.9* 2.1* 1.9*   Recent Labs    02/17/18 0325  LIPASE 21   Recent Labs    02/17/18 0435  AMMONIA 25   CBC: Recent Labs    02/16/18 1915 02/17/18 0435 02/18/18 0308 02/19/18 0311 02/20/18 0325 02/21/18 0325 03/02/18  WBC 18.1* 19.0* 14.6* 9.9 10.8* 7.6 7.2  NEUTROABS 16.8* 16.7* 13.2*  --   --   --   --   HGB 12.5 10.1* 12.1 10.3* 10.4* 10.1* 11.1*  HCT 42.8 34.9* 40.8 35.6* 35.6* 34.1* 34*  MCV 89.5 90.6 87.9 89.7 90.4 89.0  --   PLT 265 193 124* 197 232 192 192   Lipid No results for input(s): CHOL, HDL, LDLCALC, TRIG in the last 8760 hours.  Cardiac Enzymes: Recent Labs    02/17/18 0325  TROPONINI 0.04*   BNP: No results for input(s): BNP in the last 8760 hours. No results found for: MICROALBUR No results found for: HGBA1C Lab Results  Component Value Date   TSH 1.68 08/06/2013   No results found for: VITAMINB12 No results found for: FOLATE No results found for: IRON, TIBC, FERRITIN  Imaging and Procedures obtained prior to SNF admission: Ct Head Wo Contrast  Result Date: 02/17/2018 CLINICAL DATA:  Fever and altered mental status for 1 day. EXAM: CT HEAD WITHOUT CONTRAST TECHNIQUE: Contiguous axial images were obtained from the base of the skull through the vertex without intravenous contrast. COMPARISON:  10/18/2017 FINDINGS: Brain: Patient positioning limits examination. Area of encephalomalacia demonstrated in the right frontal lobe without change since prior study. Mild diffuse cerebral atrophy. Ventricular dilatation, partially related to encephalomalacia. Low-attenuation changes in the deep white matter likely represent small vessel ischemia. No change in appearance of intracranial contents since previous study. No acute mass effect or abnormal extra-axial fluid collections. No acute intracranial hemorrhage. Vascular: Intracranial arterial vascular calcifications are present in the vertebrobasilar and carotid arteries. Skull: Focal  cranial ostomy in the right anterior frontal region. No acute depressed skull fractures identified. Sinuses/Orbits: Opacification of a posterior right ethmoid air cells. Paranasal sinuses are otherwise clear. Mastoid air cells are clear. No change since prior study. Other: None. IMPRESSION: No acute intracranial abnormalities. Chronic atrophy and small vessel ischemic changes. Area of old encephalomalacia in the right frontal lobe unchanged. Electronically Signed   By: Lucienne Capers M.D.   On: 02/17/2018 03:06   US Abdomen Limited  Result Date: 02/16/2018 CLINICAL DATA:  83 y/o F; abdominal pain and abnormal liver function test. EXAM: ULTRASOUND ABDOMEN LIMITED RIGHT UPPER QUADRANT COMPARISON:  None. FINDINGS: Gallbladder: Not identified. Common bile duct: Diameter: 7 mm Liver: No focal lesion identified. Within normal limits in parenchymal echogenicity. Portal vein is patent on color Doppler imaging with normal direction of blood flow towards the liver. IMPRESSION: Gallbladder not identified. Otherwise unremarkable right upper quadrant ultrasound. Electronically Signed   By: Kristine Garbe M.D.   On: 02/16/2018 22:45   Dg Chest Port 1 View  Result Date: 02/16/2018 CLINICAL DATA:  Fever EXAM: PORTABLE CHEST 1 VIEW COMPARISON:  July 19, 2017 FINDINGS: There is mild scarring in the left lower lobe. There is no edema or consolidation. Heart size and pulmonary vascularity are normal. No adenopathy. There  is aortic atherosclerosis. Bones are osteoporotic. There are postoperative changes in the right breast region. There is evidence of an old fracture of the right proximal humerus with remodeling in this area. There is chronic anterior dislocation of the right shoulder. IMPRESSION: Scarring left base region. No edema or consolidation. Stable cardiac silhouette. There is aortic atherosclerosis. Bones osteoporotic. Chronic anterior dislocation right shoulder with evidence of old healed fracture right  proximal humerus with remodeling. Postoperative change right breast region. Aortic Atherosclerosis (ICD10-I70.0). Electronically Signed   By: Lowella Grip III M.D.   On: 02/16/2018 19:42   Ct Renal Stone Study  Result Date: 02/17/2018 CLINICAL DATA:  83 y/o  F; fever and flank pain. EXAM: CT ABDOMEN AND PELVIS WITHOUT CONTRAST TECHNIQUE: Multidetector CT imaging of the abdomen and pelvis was performed following the standard protocol without IV contrast. COMPARISON:  None. FINDINGS: Lower chest: Small bilateral pleural effusions. Ill-defined bilateral dependent lower lobe opacities may represent atelectasis or pneumonia. 5 mm nodules in the right middle and lower lobes (series 3, image 2 and 4) and 7 mm nodule in the right middle lobe (series 3, image 11). Hepatobiliary: Diffuse intra biliary ductal dilatation. Multiple biliary stones within the common bile duct (series 4, image 51). Cholecystectomy. Pancreas: Unremarkable. No pancreatic ductal dilatation or surrounding inflammatory changes. Spleen: Normal in size without focal abnormality. Adrenals/Urinary Tract: Normal adrenal glands. Normal right kidney and ureter. 6 mm stone in the proximal left ureter with mild left pelvicaliectasis (series 2, image 32). The downstream ureter is also mildly distended, however, no radiopaque stone is identified. Multiple bladder diverticula. Stomach/Bowel: Stomach is within normal limits. Appendix appears normal. No evidence of bowel wall thickening, distention, or inflammatory changes. Vascular/Lymphatic: Aortic atherosclerosis. No enlarged abdominal or pelvic lymph nodes. Reproductive: Uterus and bilateral adnexa are unremarkable. Other: No abdominal wall hernia or abnormality. No abdominopelvic ascites. Musculoskeletal: Severe T12, mild L3, mild L4 compression deformities. L4-5 grade 1 anterolisthesis. Chronic right proximal femur fracture intramedullary nail and proximal lag screw. 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 IMPRESSION: 1. Intrahepatic biliary ductal dilatation and multiple stones within the common bile duct. 2. Multiple pulmonary nodules measuring up to 7 mm. Non-contrast chest CT at 3-6 months is recommended. If the nodules are stable at time of repeat CT, then future CT at 18-24 months (from today's scan) is considered optional for low-risk patients, but is recommended for high-risk patients. This recommendation follows the consensus statement: Guidelines for Management of Incidental Pulmonary Nodules Detected on CT Images: From the Fleischner Society 2017; Radiology 2017; 284:228-243. 3. Small bilateral pleural effusions and small dependent lower lobe opacities which may represent associated atelectasis or pneumonia. 4. 6 mm stone in left proximal ureter with mild proximal pelvicaliectasis. 5. Chronic appearing T12, L3, L4 compression deformities. Electronically Signed   By: Kristine Garbe M.D.   On: 02/17/2018 03:11     Not all labs, radiology exams or other studies done during hospitalization come through on my EPIC note; however they are reviewed by me.    Assessment and Plan  Hyponatremia- patient is free water loss calculates to 3.47 L; will start half-normal saline at 75 cc an hour (cannot get labs reliably enough to use D5W, which we do not have on hand anyway; will order BMP in a.m.; will also continue 120 cc 8 times a day p.o. while awake   Time spent greater than 35 minutes;> 50% of time with patient was spent reviewing records,  labs, tests and studies, counseling and developing plan of care  Autumn Bryant. [name], MD

## 2018-03-12 ENCOUNTER — Non-Acute Institutional Stay (SKILLED_NURSING_FACILITY): Payer: Medicare Other | Admitting: Internal Medicine

## 2018-03-12 DIAGNOSIS — E870 Hyperosmolality and hypernatremia: Secondary | ICD-10-CM | POA: Diagnosis not present

## 2018-03-12 DIAGNOSIS — D649 Anemia, unspecified: Secondary | ICD-10-CM | POA: Diagnosis not present

## 2018-03-12 DIAGNOSIS — I10 Essential (primary) hypertension: Secondary | ICD-10-CM | POA: Diagnosis not present

## 2018-03-12 DIAGNOSIS — E876 Hypokalemia: Secondary | ICD-10-CM | POA: Diagnosis not present

## 2018-03-12 LAB — BASIC METABOLIC PANEL
BUN: 15 (ref 4–21)
Creatinine: 0.5 (ref 0.5–1.1)
Glucose: 85
Potassium: 2.8 — AB (ref 3.4–5.3)
Sodium: 151 — AB (ref 137–147)

## 2018-03-13 ENCOUNTER — Other Ambulatory Visit: Payer: Self-pay | Admitting: Internal Medicine

## 2018-03-13 ENCOUNTER — Non-Acute Institutional Stay (SKILLED_NURSING_FACILITY): Payer: Medicare Other | Admitting: Internal Medicine

## 2018-03-13 DIAGNOSIS — D649 Anemia, unspecified: Secondary | ICD-10-CM | POA: Diagnosis not present

## 2018-03-13 DIAGNOSIS — I10 Essential (primary) hypertension: Secondary | ICD-10-CM | POA: Diagnosis not present

## 2018-03-13 DIAGNOSIS — E870 Hyperosmolality and hypernatremia: Secondary | ICD-10-CM | POA: Diagnosis not present

## 2018-03-13 MED ORDER — ALPRAZOLAM 0.5 MG PO TABS: 0.5000 mg | ORAL_TABLET | Freq: Two times a day (BID) | ORAL | 1 refills | Status: DC

## 2018-03-14 ENCOUNTER — Encounter: Payer: Self-pay | Admitting: Internal Medicine

## 2018-03-14 NOTE — Progress Notes (Addendum)
Location:   [facility]:   SNF  [name], Noah Delaine, MD  [name], [name]  Extended Emergency Contact Information Primary Emergency Contact: [name] Address: [address]          [place], [place] [address] [name] of [place] Phone: [phone] Relation: Son Secondary Emergency Contact: [name],[name]          [place], [place] Relation: Sister    Allergies: Demerol [meperidine]  Chief Complaint  Patient presents with  . Acute Visit    HPI: Patient is 83 y.o. female who is being seen in follow-up from yesterday's visit for acute hypernatremia with a sodium of 155.  Today patient sodium is 151 with a chloride of 110 CO2 of 32 and a potassium of 2.8 patient's BUN is 15 and creatinine 0.52.  This is better but patient needs more.  Patient is awake and alert, drinking well, looks good.  Past Medical History:  Diagnosis Date  . Anasarca 05/21/2016  . Anxiety 11/03/2016  . Closed TBI (traumatic brain injury) ([place]) 04/21/2016  . Dysphagia 07/13/2016  . Gout   . Hypertension   . Obesity (BMI 30.0-34.9) 05/21/2016  . Status post insertion of percutaneous endoscopic gastrostomy (PEG) tube ([facility]) 04/21/2016    Past Surgical History:  Procedure Laterality Date  . CHOLECYSTECTOMY N/A 07/21/2017   Procedure: LAPAROSCOPIC CHOLECYSTECTOMY;  Surgeon: Erroll Luna, MD;  Location: [facility];  Service: General;  Laterality: N/A;  . ERCP N/A 02/18/2018   Procedure: ENDOSCOPIC RETROGRADE CHOLANGIOPANCREATOGRAPHY (ERCP);  Surgeon: Milus Banister, MD;  Location: [facility];  Service: Endoscopy;  Laterality: N/A;  . INTRAMEDULLARY (IM) NAIL INTERTROCHANTERIC Right 12/27/2016   Procedure: INTRAMEDULLARY (IM) NAIL INTERTROCHANTRIC RIGHT HIP;  Surgeon: Paralee Cancel, MD;  Location: [facility];  Service: Orthopedics;  Laterality: Right;  . MASTECTOMY    . REMOVAL OF STONES  02/18/2018   Procedure: REMOVAL OF STONES;  Surgeon: Milus Banister, MD;  Location: [facility];  Service: Endoscopy;;  balloon sweep  . SPHINCTEROTOMY  02/18/2018   Procedure: SPHINCTEROTOMY;  Surgeon: Milus Banister, MD;  Location: [facility];  Service: Endoscopy;;    Allergies as of 03/12/2018      Reactions   Demerol [meperidine] Swelling      Medication List       Accurate as of March 12, 2018 11:59 PM. Always use your most recent med list.        ALPRAZolam 0.5 MG tablet Commonly known as:  XANAX Take 1 tablet (0.5 mg total) by mouth 2 (two) times daily.   bimatoprost 0.01 % Soln Commonly known as:  LUMIGAN Place 1 drop into both eyes at bedtime. For glaucoma   bisacodyl 10 MG suppository Commonly known as:  DULCOLAX Place 10 mg rectally daily as needed (constipation not relieved by MOM).   ENSURE Take 237 mLs by mouth. TID DUE TO WEIGHT LOSS ( CHOCOLATE IF AVAILABLE )   escitalopram 5 MG tablet Commonly known as:  LEXAPRO Take 5 mg by mouth daily.   feeding supplement (PRO-STAT SUGAR FREE 64) Liqd Take 30 mLs by mouth 2 (two) times daily.   FLEET ENEMA RE Place 1 each rectally daily as needed (constipation not relieved by bisacodyl suppository).   furosemide 20 MG tablet Commonly known as:  LASIX Take 20 mg by mouth daily.   gabapentin 600 MG tablet Commonly known as:  NEURONTIN Take 600  mg by mouth 3 (three) times daily.   MILK OF MAGNESIA PO Take 30 mLs by mouth daily as needed (if no BM in 3 days).   mirtazapine 7.5 MG tablet Commonly known as:  REMERON Take 7.5 mg by mouth. TAKE 1 TABLET BY MOUTH AT BEDTIME FOR APPETITE STIMULANT   MULTIPLE VITAMINS-MINERALS PO Take by mouth. TAKE 1 TABLET BY MOUTH ONCE DAILY TO PROMOTE WOUND HEALING   oxyCODONE-acetaminophen 5-325 MG tablet Commonly known as:  PERCOCET/ROXICET Take 1 tablet by mouth every 8 (eight) hours.   sennosides-docusate sodium 8.6-50 MG tablet Commonly known as:  SENOKOT-S Take 2 tablets  by mouth 2 (two) times daily as needed for constipation.   venlafaxine XR 75 MG 24 hr capsule Commonly known as:  EFFEXOR-XR Take 75 mg by mouth 2 (two) times daily. FOR DEPRESSION (DO NOT CRUSH)       No orders of the defined types were placed in this encounter.   Immunization History  Administered Date(s) Administered  . Influenza-Unspecified 12/19/2016  . PPD Test 04/29/2016  . Pneumococcal Conjugate-13 08/06/2016  . Pneumococcal Polysaccharide-23 04/15/2016  . Tdap 03/31/2016    Social History   Tobacco Use  . Smoking status: Never Smoker  . Smokeless tobacco: Never Used  Substance Use Topics  . Alcohol use: Not Currently    Review of Systems  DATA OBTAINED: from patient, nurse GENERAL:  no fevers, fatigue, appetite changes SKIN: No itching, rash HEENT: No complaint RESPIRATORY: No cough, wheezing, SOB CARDIAC: No chest pain, palpitations, lower extremity edema  GI: No abdominal pain, No N/V/D or constipation, No heartburn or reflux  GU: No dysuria, frequency or urgency, or incontinence  MUSCULOSKELETAL: No unrelieved bone/joint pain NEUROLOGIC: No headache, dizziness  PSYCHIATRIC: No overt anxiety or sadness  Vitals:   03/14/18 2024  BP: 120/73  Pulse: 71  Resp: 18  Temp: 98 F (36.7 C)   Body mass index is 25.96 kg/m. Physical Exam  GENERAL APPEARANCE: Alert, conversant, No acute distress  SKIN: No diaphoresis rash HEENT: Unremarkable RESPIRATORY: Breathing is even, unlabored. Lung sounds are clear   CARDIOVASCULAR: Heart RRR no murmurs, rubs or gallops. No peripheral edema  GASTROINTESTINAL: Abdomen is soft, non-tender, not distended w/ normal bowel sounds.  GENITOURINARY: Bladder non tender, not distended  MUSCULOSKELETAL: No abnormal joints or musculature NEUROLOGIC: Cranial nerves 2-12 grossly intact. Moves all extremities PSYCHIATRIC: Mood and affect appropriate to situation, status post TBI and dementia, no behavioral issues  Patient  Active Problem List   Diagnosis Date Noted  . MRSA (methicillin resistant Staphylococcus aureus) septicemia ([facility]) 02/23/2018  . Hypokalemia 02/23/2018  . Bile duct stone   . E. coli sepsis ([facility]) 02/17/2018  . Cholecystitis 07/31/2017  . Cholecystitis, acute with cholelithiasis 07/20/2017  . Pressure injury of skin 07/20/2017  . History of total right hip arthroplasty 01/05/2017  . Acute blood loss as cause of postoperative anemia 01/05/2017  . Escherichia coli urinary tract infection 01/05/2017  . Acute encephalopathy 01/05/2017  . Depression, recurrent ([facility]) 01/05/2017  . Polyneuropathy 01/05/2017  . Closed right hip fracture ([facility]) 12/25/2016  . Anxiety 11/03/2016  . Dysphagia 07/13/2016  . Anasarca 05/21/2016  . Obesity (BMI 30.0-34.9) 05/21/2016  . Closed TBI (traumatic brain injury) ([facility]) 04/21/2016  . Status post insertion of percutaneous endoscopic gastrostomy (PEG) tube ([facility]) 04/21/2016  . Facial laceration 04/21/2016  . Cheek wound, right, subsequent encounter 04/21/2016  . Urinary tract infection due to extended-spectrum beta lactamase (ESBL) producing Escherichia coli 04/21/2016  . Hypotension 04/21/2016  .  Gout   . Hypertension     CMP     Component Value Date/Time   NA 151 (A) 03/02/2018   NA 145 07/28/2017   K 3.4 03/02/2018   K 3.7 07/28/2017   CL 112 (H) 02/21/2018 0325   CO2 25 02/21/2018 0325   GLUCOSE 87 02/21/2018 0325   BUN 8 03/02/2018   CREATININE 0.5 03/02/2018   CREATININE 0.42 (L) 02/21/2018 0325   CREATININE 0.39 07/28/2017   CALCIUM 7.6 (L) 02/21/2018 0325   CALCIUM 8.1 07/28/2017   PROT 4.8 (L) 02/21/2018 0325   ALBUMIN 1.9 (L) 02/21/2018 0325   AST 30 02/21/2018 0325   ALT 33 02/21/2018 0325   ALKPHOS 324 (H) 02/21/2018 0325   BILITOT 1.1 02/21/2018 0325   GFRNONAA >60 02/21/2018 0325   GFRNONAA >90 07/28/2017   GFRAA >60 02/21/2018 0325   Recent Labs    02/16/18 2227  02/19/18 0311 02/20/18 0325 02/21/18 0325 03/02/18  NA  --     < > 145 147* 146* 151*  K  --    < > 3.3* 3.7 3.0* 3.4  CL  --    < > 115* 116* 112*  --   CO2  --    < > 24 24 25   --   GLUCOSE  --    < > 96 84 87  --   BUN  --    < > 12 10 8 8   CREATININE  --    < > 0.54 0.54 0.42* 0.5  CALCIUM  --    < > 7.7* 7.9* 7.6*  --   MG 2.1  --   --   --   --   --    < > = values in this interval not displayed.   Recent Labs    02/19/18 0311 02/20/18 0325 02/21/18 0325  AST 71* 45* 30  ALT 56* 44 33  ALKPHOS 299* 358* 324*  BILITOT 2.2* 1.9* 1.1  PROT 5.0* 5.0* 4.8*  ALBUMIN 1.9* 2.1* 1.9*   Recent Labs    02/16/18 1915 02/17/18 0435 02/18/18 0308 02/19/18 0311 02/20/18 0325 02/21/18 0325 03/02/18  WBC 18.1* 19.0* 14.6* 9.9 10.8* 7.6 7.2  NEUTROABS 16.8* 16.7* 13.2*  --   --   --   --   HGB 12.5 10.1* 12.1 10.3* 10.4* 10.1* 11.1*  HCT 42.8 34.9* 40.8 35.6* 35.6* 34.1* 34*  MCV 89.5 90.6 87.9 89.7 90.4 89.0  --   PLT 265 193 124* 197 232 192 192   No results for input(s): CHOL, LDLCALC, TRIG in the last 8760 hours.  Invalid input(s): HCL No results found for: MICROALBUR Lab Results  Component Value Date   TSH 1.68 08/06/2013   No results found for: HGBA1C No results found for: CHOL, HDL, LDLCALC, LDLDIRECT, TRIG, CHOLHDL  Significant Diagnostic Results in last 30 days:  Ct Head Wo Contrast  Result Date: 02/17/2018 CLINICAL DATA:  Fever and altered mental status for 1 day. EXAM: CT HEAD WITHOUT CONTRAST TECHNIQUE: Contiguous axial images were obtained from the base of the skull through the vertex without intravenous contrast. COMPARISON:  10/18/2017 FINDINGS: Brain: Patient positioning limits examination. Area of encephalomalacia demonstrated in the right frontal lobe without change since prior study. Mild diffuse cerebral atrophy. Ventricular dilatation, partially related to encephalomalacia. Low-attenuation changes in the deep white matter likely represent small vessel ischemia. No change in appearance of intracranial contents since  previous study. No acute mass effect or abnormal extra-axial fluid collections.  No acute intracranial hemorrhage. Vascular: Intracranial arterial vascular calcifications are present in the vertebrobasilar and carotid arteries. Skull: Focal cranial ostomy in the right anterior frontal region. No acute depressed skull fractures identified. Sinuses/Orbits: Opacification of a posterior right ethmoid air cells. Paranasal sinuses are otherwise clear. Mastoid air cells are clear. No change since prior study. Other: None. IMPRESSION: No acute intracranial abnormalities. Chronic atrophy and small vessel ischemic changes. Area of old encephalomalacia in the right frontal lobe unchanged. Electronically Signed   By: Lucienne Capers M.D.   On: 02/17/2018 03:06   US Abdomen Limited  Result Date: 02/16/2018 CLINICAL DATA:  83 y/o F; abdominal pain and abnormal liver function test. EXAM: ULTRASOUND ABDOMEN LIMITED RIGHT UPPER QUADRANT COMPARISON:  None. FINDINGS: Gallbladder: Not identified. Common bile duct: Diameter: 7 mm Liver: No focal lesion identified. Within normal limits in parenchymal echogenicity. Portal vein is patent on color Doppler imaging with normal direction of blood flow towards the liver. IMPRESSION: Gallbladder not identified. Otherwise unremarkable right upper quadrant ultrasound. Electronically Signed   By: Kristine Garbe M.D.   On: 02/16/2018 22:45   Dg Chest Port 1 View  Result Date: 02/16/2018 CLINICAL DATA:  Fever EXAM: PORTABLE CHEST 1 VIEW COMPARISON:  July 19, 2017 FINDINGS: There is mild scarring in the left lower lobe. There is no edema or consolidation. Heart size and pulmonary vascularity are normal. No adenopathy. There is aortic atherosclerosis. Bones are osteoporotic. There are postoperative changes in the right breast region. There is evidence of an old fracture of the right proximal humerus with remodeling in this area. There is chronic anterior dislocation of the right  shoulder. IMPRESSION: Scarring left base region. No edema or consolidation. Stable cardiac silhouette. There is aortic atherosclerosis. Bones osteoporotic. Chronic anterior dislocation right shoulder with evidence of old healed fracture right proximal humerus with remodeling. Postoperative change right breast region. Aortic Atherosclerosis (ICD10-I70.0). Electronically Signed   By: Lowella Grip III M.D.   On: 02/16/2018 19:42   Dg Ercp Biliary & Pancreatic Ducts  Result Date: 02/18/2018 CLINICAL DATA:  Common bile duct stone. Evaluate patency of the CBD. EXAM: ERCP TECHNIQUE: Multiple spot images obtained with the fluoroscopic device and submitted for interpretation post-procedure. COMPARISON:  CT abdomen pelvis-02/17/2018 FINDINGS: Six spot intraoperative fluoroscopic images of the right upper abdominal quadrant during ERCP are provided for review Initial image demonstrates an ERCP probe overlying the right upper abdominal quadrant. There is suspected cannulation of the common bile duct. Cholecystectomy clips overlies expected location of the gallbladder fossa. Subsequent images demonstrate selective cannulation and opacification of the common bile duct. There are multiple nonocclusive filling defects seen within the CBD worrisome for choledocholithiasis as was suggested on preceding abdominal CT. Subsequent images demonstrate insufflation of a balloon within the CBD with subsequent biliary sweeping and presumed sphincterotomy. IMPRESSION: ERCP with findings suggestive of choledocholithiasis with subsequent presumed biliary sweeping and sphincterotomy. These images were submitted for radiologic interpretation only. Please see the procedural report for the amount of contrast and the fluoroscopy time utilized. Electronically Signed   By: Sandi Mariscal M.D.   On: 02/18/2018 12:05   Ct Renal Stone Study  Result Date: 02/17/2018 CLINICAL DATA:  83 y/o  F; fever and flank pain. EXAM: CT ABDOMEN AND PELVIS  WITHOUT CONTRAST TECHNIQUE: Multidetector CT imaging of the abdomen and pelvis was performed following the standard protocol without IV contrast. COMPARISON:  None. FINDINGS: Lower chest: Small bilateral pleural effusions. Ill-defined bilateral dependent lower lobe opacities may represent atelectasis or pneumonia.  5 mm nodules in the right middle and lower lobes (series 3, image 2 and 4) and 7 mm nodule in the right middle lobe (series 3, image 11). Hepatobiliary: Diffuse intra biliary ductal dilatation. Multiple biliary stones within the common bile duct (series 4, image 51). Cholecystectomy. Pancreas: Unremarkable. No pancreatic ductal dilatation or surrounding inflammatory changes. Spleen: Normal in size without focal abnormality. Adrenals/Urinary Tract: Normal adrenal glands. Normal right kidney and ureter. 6 mm stone in the proximal left ureter with mild left pelvicaliectasis (series 2, image 32). The downstream ureter is also mildly distended, however, no radiopaque stone is identified. Multiple bladder diverticula. Stomach/Bowel: Stomach is within normal limits. Appendix appears normal. No evidence of bowel wall thickening, distention, or inflammatory changes. Vascular/Lymphatic: Aortic atherosclerosis. No enlarged abdominal or pelvic lymph nodes. Reproductive: Uterus and bilateral adnexa are unremarkable. Other: No abdominal wall hernia or abnormality. No abdominopelvic ascites. Musculoskeletal: Severe T12, mild L3, mild L4 compression deformities. L4-5 grade 1 anterolisthesis. Chronic right proximal femur fracture intramedullary nail and proximal lag screw. 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 IMPRESSION: 1. Intrahepatic biliary ductal dilatation and multiple stones within the common bile duct. 2. Multiple pulmonary nodules measuring up to 7 mm. Non-contrast chest CT at 3-6 months is recommended. If the nodules are stable at time of  repeat CT, then future CT at 18-24 months (from today's scan) is considered optional for low-risk patients, but is recommended for high-risk patients. This recommendation follows the consensus statement: Guidelines for Management of Incidental Pulmonary Nodules Detected on CT Images: From the Fleischner Society 2017; Radiology 2017; 284:228-243. 3. Small bilateral pleural effusions and small dependent lower lobe opacities which may represent associated atelectasis or pneumonia. 4. 6 mm stone in left proximal ureter with mild proximal pelvicaliectasis. 5. Chronic appearing T12, L3, L4 compression deformities. Electronically Signed   By: Kristine Garbe M.D.   On: 02/17/2018 03:11   [place] Ekg Site Rite  Result Date: 02/20/2018 If Site Rite image not attached, placement could not be confirmed due to current cardiac rhythm.   Assessment and Plan  Hyponatremia- sodium 151 is an obvious improvement, patient's BUN and creatinine is holding up well; patient's potassium is 2.8 so have ordered 40 mEq of potassium now and 40 mEq of potassium tonight in addition to 40 mEq of potassium daily for the next 3 days.  Also plan to continue one half normal saline for 2 more liters with a BMP tomorrow to reassess.     Time spent greater than 35 minutes;> 50% of time with patient was spent reviewing records, labs, tests and studies, counseling and developing plan of care  Inocencio Homes, MD

## 2018-03-14 NOTE — Progress Notes (Signed)
Location:   [facility]:   SNF  Autumn Bryant, Autumn Delaine, MD  [name], [name]  Extended Emergency Contact Information Primary Emergency Contact: [name] Address: [address]          [place], [place] [address] [name] of [place] Phone: [phone] Relation: Son Secondary Emergency Contact: [name],[name]          [place], [place] Relation: Sister    Allergies: Demerol [meperidine]  Chief Complaint  Patient presents with  . Acute Visit    HPI: Patient is 83 y.o. female who is being seen in follow-up for her ongoing hyponatremia.  Patient sodium today is 148 and osmolality is normal for the first time.  Patient's potassium is 3.7 which is excellent patient continues to be awake and alert and is also drinking water on a regular every 2 hour schedule while awake.  Patient has no shortness of breath or chest pain, no edema.  Past Medical History:  Diagnosis Date  . Anasarca 05/21/2016  . Anxiety 11/03/2016  . Closed TBI (traumatic brain injury) ([place]) 04/21/2016  . Dysphagia 07/13/2016  . Gout   . Hypertension   . Obesity (BMI 30.0-34.9) 05/21/2016  . Status post insertion of percutaneous endoscopic gastrostomy (PEG) tube ([place]) 04/21/2016    Past Surgical History:  Procedure Laterality Date  . CHOLECYSTECTOMY N/A 07/21/2017   Procedure: LAPAROSCOPIC CHOLECYSTECTOMY;  Surgeon: Erroll Luna, MD;  Location: [facility];  Service: General;  Laterality: N/A;  . ERCP N/A 02/18/2018   Procedure: ENDOSCOPIC RETROGRADE CHOLANGIOPANCREATOGRAPHY (ERCP);  Surgeon: Milus Banister, MD;  Location: [facility];  Service: Endoscopy;  Laterality: N/A;  . INTRAMEDULLARY (IM) NAIL INTERTROCHANTERIC Right 12/27/2016   Procedure: INTRAMEDULLARY (IM) NAIL INTERTROCHANTRIC RIGHT HIP;  Surgeon: Paralee Cancel, MD;  Location: [facility];  Service: Orthopedics;  Laterality: Right;  . MASTECTOMY      . REMOVAL OF STONES  02/18/2018   Procedure: REMOVAL OF STONES;  Surgeon: Milus Banister, MD;  Location: [facility];  Service: Endoscopy;;  balloon sweep  . SPHINCTEROTOMY  02/18/2018   Procedure: SPHINCTEROTOMY;  Surgeon: Milus Banister, MD;  Location: [facility];  Service: Endoscopy;;    Allergies as of 03/13/2018      Reactions   Demerol [meperidine] Swelling      Medication List       Accurate as of March 13, 2018 11:59 PM. Always use your most recent med list.        ALPRAZolam 0.5 MG tablet Commonly known as:  XANAX Take 1 tablet (0.5 mg total) by mouth 2 (two) times daily.   bimatoprost 0.01 % Soln Commonly known as:  LUMIGAN Place 1 drop into both eyes at bedtime. For glaucoma   bisacodyl 10 MG suppository Commonly known as:  DULCOLAX Place 10 mg rectally daily as needed (constipation not relieved by MOM).   ENSURE Take 237 mLs by mouth. TID DUE TO WEIGHT LOSS ( CHOCOLATE IF AVAILABLE )   escitalopram 5 MG tablet Commonly known as:  LEXAPRO Take 5 mg by mouth daily.   feeding supplement (PRO-STAT SUGAR FREE 64) Liqd Take 30 mLs by mouth 2 (two) times daily.   FLEET ENEMA RE Place 1 each rectally daily as needed (constipation not relieved by bisacodyl suppository).   furosemide 20 MG tablet Commonly known as:  LASIX Take 20 mg by mouth daily.   gabapentin 600 MG tablet Commonly  known as:  NEURONTIN Take 600 mg by mouth 3 (three) times daily.   MILK OF MAGNESIA PO Take 30 mLs by mouth daily as needed (if no BM in 3 days).   mirtazapine 7.5 MG tablet Commonly known as:  REMERON Take 7.5 mg by mouth. TAKE 1 TABLET BY MOUTH AT BEDTIME FOR APPETITE STIMULANT   MULTIPLE VITAMINS-MINERALS PO Take by mouth. TAKE 1 TABLET BY MOUTH ONCE DAILY TO PROMOTE WOUND HEALING   oxyCODONE-acetaminophen 5-325 MG tablet Commonly known as:  PERCOCET/ROXICET Take 1 tablet by mouth every 8 (eight) hours.   sennosides-docusate sodium 8.6-50 MG tablet Commonly  known as:  SENOKOT-S Take 2 tablets by mouth 2 (two) times daily as needed for constipation.   venlafaxine XR 75 MG 24 hr capsule Commonly known as:  EFFEXOR-XR Take 75 mg by mouth 2 (two) times daily. FOR DEPRESSION (DO NOT CRUSH)       No orders of the defined types were placed in this encounter.   Immunization History  Administered Date(s) Administered  . Influenza-Unspecified 12/19/2016  . PPD Test 04/29/2016  . Pneumococcal Conjugate-13 08/06/2016  . Pneumococcal Polysaccharide-23 04/15/2016  . Tdap 03/31/2016    Social History   Tobacco Use  . Smoking status: Never Smoker  . Smokeless tobacco: Never Used  Substance Use Topics  . Alcohol use: Not Currently    Review of Systems  DATA OBTAINED  from patient, nurse GENERAL:  no fevers, fatigue, appetite changes SKIN: No itching, rash HEENT: No complaint RESPIRATORY: No cough, wheezing, SOB CARDIAC: No chest pain, palpitations, lower extremity edema  GI: No abdominal pain, No N/V/D or constipation, No heartburn or reflux  GU: No dysuria, frequency or urgency, or incontinence  MUSCULOSKELETAL: No unrelieved bone/joint pain NEUROLOGIC: No headache, dizziness  PSYCHIATRIC: No overt anxiety or sadness  Vitals:   03/14/18 2204  BP: 112/65  Pulse: 89  Resp: 18  Temp: (!) 97.4 F (36.3 C)   Body mass index is 25.96 kg/m. Physical Exam  GENERAL APPEARANCE: Alert, conversant, No acute distress  SKIN: No diaphoresis rash HEENT: Unremarkable RESPIRATORY: Breathing is even, unlabored. Lung sounds are clear   CARDIOVASCULAR: Heart RRR no murmurs, rubs or gallops. No peripheral edema  GASTROINTESTINAL: Abdomen is soft, non-tender, not distended w/ normal bowel sounds.  GENITOURINARY: Bladder non tender, not distended  MUSCULOSKELETAL: No abnormal joints or musculature NEUROLOGIC: Cranial nerves 2-12 grossly intact. Moves all extremities PSYCHIATRIC: Mood and affect appropriate to situation, status post TBI with  some dementia, no behavioral issues  Patient Active Problem List   Diagnosis Date Noted  . MRSA (methicillin resistant Staphylococcus aureus) septicemia ([facility]) 02/23/2018  . Hypokalemia 02/23/2018  . Bile duct stone   . E. coli sepsis ([facility]) 02/17/2018  . Cholecystitis 07/31/2017  . Cholecystitis, acute with cholelithiasis 07/20/2017  . Pressure injury of skin 07/20/2017  . History of total right hip arthroplasty 01/05/2017  . Acute blood loss as cause of postoperative anemia 01/05/2017  . Escherichia coli urinary tract infection 01/05/2017  . Acute encephalopathy 01/05/2017  . Depression, recurrent ([place]) 01/05/2017  . Polyneuropathy 01/05/2017  . Closed right hip fracture ([facility]) 12/25/2016  . Anxiety 11/03/2016  . Dysphagia 07/13/2016  . Anasarca 05/21/2016  . Obesity (BMI 30.0-34.9) 05/21/2016  . Closed TBI (traumatic brain injury) ([place]) 04/21/2016  . Status post insertion of percutaneous endoscopic gastrostomy (PEG) tube ([facility]) 04/21/2016  . Facial laceration 04/21/2016  . Cheek wound, right, subsequent encounter 04/21/2016  . Urinary tract infection due to extended-spectrum beta lactamase (  ESBL) producing Escherichia coli 04/21/2016  . Hypotension 04/21/2016  . Gout   . Hypertension     CMP     Component Value Date/Time   NA 151 (A) 03/02/2018   NA 145 07/28/2017   K 3.4 03/02/2018   K 3.7 07/28/2017   CL 112 (H) 02/21/2018 0325   CO2 25 02/21/2018 0325   GLUCOSE 87 02/21/2018 0325   BUN 8 03/02/2018   CREATININE 0.5 03/02/2018   CREATININE 0.42 (L) 02/21/2018 0325   CREATININE 0.39 07/28/2017   CALCIUM 7.6 (L) 02/21/2018 0325   CALCIUM 8.1 07/28/2017   PROT 4.8 (L) 02/21/2018 0325   ALBUMIN 1.9 (L) 02/21/2018 0325   AST 30 02/21/2018 0325   ALT 33 02/21/2018 0325   ALKPHOS 324 (H) 02/21/2018 0325   BILITOT 1.1 02/21/2018 0325   GFRNONAA >60 02/21/2018 0325   GFRNONAA >90 07/28/2017   GFRAA >60 02/21/2018 0325   Recent Labs    02/16/18 2227  02/19/18 0311  02/20/18 0325 02/21/18 0325 03/02/18  NA  --    < > 145 147* 146* 151*  K  --    < > 3.3* 3.7 3.0* 3.4  CL  --    < > 115* 116* 112*  --   CO2  --    < > 24 24 25   --   GLUCOSE  --    < > 96 84 87  --   BUN  --    < > 12 10 8 8   CREATININE  --    < > 0.54 0.54 0.42* 0.5  CALCIUM  --    < > 7.7* 7.9* 7.6*  --   MG 2.1  --   --   --   --   --    < > = values in this interval not displayed.   Recent Labs    02/19/18 0311 02/20/18 0325 02/21/18 0325  AST 71* 45* 30  ALT 56* 44 33  ALKPHOS 299* 358* 324*  BILITOT 2.2* 1.9* 1.1  PROT 5.0* 5.0* 4.8*  ALBUMIN 1.9* 2.1* 1.9*   Recent Labs    02/16/18 1915 02/17/18 0435 02/18/18 0308 02/19/18 0311 02/20/18 0325 02/21/18 0325 03/02/18  WBC 18.1* 19.0* 14.6* 9.9 10.8* 7.6 7.2  NEUTROABS 16.8* 16.7* 13.2*  --   --   --   --   HGB 12.5 10.1* 12.1 10.3* 10.4* 10.1* 11.1*  HCT 42.8 34.9* 40.8 35.6* 35.6* 34.1* 34*  MCV 89.5 90.6 87.9 89.7 90.4 89.0  --   PLT 265 193 124* 197 232 192 192   No results for input(s): CHOL, LDLCALC, TRIG in the last 8760 hours.  Invalid input(s): HCL No results found for: MICROALBUR Lab Results  Component Value Date   TSH 1.68 08/06/2013   No results found for: HGBA1C No results found for: CHOL, HDL, LDLCALC, LDLDIRECT, TRIG, CHOLHDL  Significant Diagnostic Results in last 30 days:  Ct Head Wo Contrast  Result Date: 02/17/2018 CLINICAL DATA:  Fever and altered mental status for 1 day. EXAM: CT HEAD WITHOUT CONTRAST TECHNIQUE: Contiguous axial images were obtained from the base of the skull through the vertex without intravenous contrast. COMPARISON:  10/18/2017 FINDINGS: Brain: Patient positioning limits examination. Area of encephalomalacia demonstrated in the right frontal lobe without change since prior study. Mild diffuse cerebral atrophy. Ventricular dilatation, partially related to encephalomalacia. Low-attenuation changes in the deep white matter likely represent small vessel ischemia. No  change in appearance of intracranial contents since  previous study. No acute mass effect or abnormal extra-axial fluid collections. No acute intracranial hemorrhage. Vascular: Intracranial arterial vascular calcifications are present in the vertebrobasilar and carotid arteries. Skull: Focal cranial ostomy in the right anterior frontal region. No acute depressed skull fractures identified. Sinuses/Orbits: Opacification of a posterior right ethmoid air cells. Paranasal sinuses are otherwise clear. Mastoid air cells are clear. No change since prior study. Other: None. IMPRESSION: No acute intracranial abnormalities. Chronic atrophy and small vessel ischemic changes. Area of old encephalomalacia in the right frontal lobe unchanged. Electronically Signed   By: Lucienne Capers M.D.   On: 02/17/2018 03:06   US Abdomen Limited  Result Date: 02/16/2018 CLINICAL DATA:  83 y/o F; abdominal pain and abnormal liver function test. EXAM: ULTRASOUND ABDOMEN LIMITED RIGHT UPPER QUADRANT COMPARISON:  None. FINDINGS: Gallbladder: Not identified. Common bile duct: Diameter: 7 mm Liver: No focal lesion identified. Within normal limits in parenchymal echogenicity. Portal vein is patent on color Doppler imaging with normal direction of blood flow towards the liver. IMPRESSION: Gallbladder not identified. Otherwise unremarkable right upper quadrant ultrasound. Electronically Signed   By: Kristine Garbe M.D.   On: 02/16/2018 22:45   Dg Chest Port 1 View  Result Date: 02/16/2018 CLINICAL DATA:  Fever EXAM: PORTABLE CHEST 1 VIEW COMPARISON:  July 19, 2017 FINDINGS: There is mild scarring in the left lower lobe. There is no edema or consolidation. Heart size and pulmonary vascularity are normal. No adenopathy. There is aortic atherosclerosis. Bones are osteoporotic. There are postoperative changes in the right breast region. There is evidence of an old fracture of the right proximal humerus with remodeling in this area.  There is chronic anterior dislocation of the right shoulder. IMPRESSION: Scarring left base region. No edema or consolidation. Stable cardiac silhouette. There is aortic atherosclerosis. Bones osteoporotic. Chronic anterior dislocation right shoulder with evidence of old healed fracture right proximal humerus with remodeling. Postoperative change right breast region. Aortic Atherosclerosis (ICD10-I70.0). Electronically Signed   By: Lowella Grip III M.D.   On: 02/16/2018 19:42   Dg Ercp Biliary & Pancreatic Ducts  Result Date: 02/18/2018 CLINICAL DATA:  Common bile duct stone. Evaluate patency of the CBD. EXAM: ERCP TECHNIQUE: Multiple spot images obtained with the fluoroscopic device and submitted for interpretation post-procedure. COMPARISON:  CT abdomen pelvis-02/17/2018 FINDINGS: Six spot intraoperative fluoroscopic images of the right upper abdominal quadrant during ERCP are provided for review Initial image demonstrates an ERCP probe overlying the right upper abdominal quadrant. There is suspected cannulation of the common bile duct. Cholecystectomy clips overlies expected location of the gallbladder fossa. Subsequent images demonstrate selective cannulation and opacification of the common bile duct. There are multiple nonocclusive filling defects seen within the CBD worrisome for choledocholithiasis as was suggested on preceding abdominal CT. Subsequent images demonstrate insufflation of a balloon within the CBD with subsequent biliary sweeping and presumed sphincterotomy. IMPRESSION: ERCP with findings suggestive of choledocholithiasis with subsequent presumed biliary sweeping and sphincterotomy. These images were submitted for radiologic interpretation only. Please see the procedural report for the amount of contrast and the fluoroscopy time utilized. Electronically Signed   By: Sandi Mariscal M.D.   On: 02/18/2018 12:05   Ct Renal Stone Study  Result Date: 02/17/2018 CLINICAL DATA:  83 y/o  F;  fever and flank pain. EXAM: CT ABDOMEN AND PELVIS WITHOUT CONTRAST TECHNIQUE: Multidetector CT imaging of the abdomen and pelvis was performed following the standard protocol without IV contrast. COMPARISON:  None. FINDINGS: Lower chest: Small bilateral pleural effusions.  Ill-defined bilateral dependent lower lobe opacities may represent atelectasis or pneumonia. 5 mm nodules in the right middle and lower lobes (series 3, image 2 and 4) and 7 mm nodule in the right middle lobe (series 3, image 11). Hepatobiliary: Diffuse intra biliary ductal dilatation. Multiple biliary stones within the common bile duct (series 4, image 51). Cholecystectomy. Pancreas: Unremarkable. No pancreatic ductal dilatation or surrounding inflammatory changes. Spleen: Normal in size without focal abnormality. Adrenals/Urinary Tract: Normal adrenal glands. Normal right kidney and ureter. 6 mm stone in the proximal left ureter with mild left pelvicaliectasis (series 2, image 32). The downstream ureter is also mildly distended, however, no radiopaque stone is identified. Multiple bladder diverticula. Stomach/Bowel: Stomach is within normal limits. Appendix appears normal. No evidence of bowel wall thickening, distention, or inflammatory changes. Vascular/Lymphatic: Aortic atherosclerosis. No enlarged abdominal or pelvic lymph nodes. Reproductive: Uterus and bilateral adnexa are unremarkable. Other: No abdominal wall hernia or abnormality. No abdominopelvic ascites. Musculoskeletal: Severe T12, mild L3, mild L4 compression deformities. L4-5 grade 1 anterolisthesis. Chronic right proximal femur fracture intramedullary nail and proximal lag screw. 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 IMPRESSION: 1. Intrahepatic biliary ductal dilatation and multiple stones within the common bile duct. 2. Multiple pulmonary nodules measuring up to 7 mm. Non-contrast chest CT at 3-6 months is  recommended. If the nodules are stable at time of repeat CT, then future CT at 18-24 months (from today's scan) is considered optional for low-risk patients, but is recommended for high-risk patients. This recommendation follows the consensus statement: Guidelines for Management of Incidental Pulmonary Nodules Detected on CT Images: From the Fleischner Society 2017; Radiology 2017; 284:228-243. 3. Small bilateral pleural effusions and small dependent lower lobe opacities which may represent associated atelectasis or pneumonia. 4. 6 mm stone in left proximal ureter with mild proximal pelvicaliectasis. 5. Chronic appearing T12, L3, L4 compression deformities. Electronically Signed   By: Kristine Garbe M.D.   On: 02/17/2018 03:11   [place] Ekg Site Rite  Result Date: 02/20/2018 If Site Rite image not attached, placement could not be confirmed due to current cardiac rhythm.   Assessment and Plan  Hypernatremia- patient sodium is 148 and potassium 3.7, BUN and creatinine both normal; will continue half-normal saline for another liter making it a total of 5 L of half-normal saline at 75 cc an hour and patient is still getting her 40 mEq of potassium daily; patient is still getting water 120 cc every 2 hours while awake; this should be enough to catch up with patient's free water needs which were 3-1/2 L;  have ordered BMP for Monday    Inocencio Homes, MD

## 2018-03-16 DIAGNOSIS — I10 Essential (primary) hypertension: Secondary | ICD-10-CM | POA: Diagnosis not present

## 2018-03-16 DIAGNOSIS — L89153 Pressure ulcer of sacral region, stage 3: Secondary | ICD-10-CM | POA: Diagnosis not present

## 2018-03-16 DIAGNOSIS — D649 Anemia, unspecified: Secondary | ICD-10-CM | POA: Diagnosis not present

## 2018-03-16 LAB — CBC AND DIFFERENTIAL
HCT: 32 — AB (ref 36–46)
Hemoglobin: 10.4 — AB (ref 12.0–16.0)
Platelets: 230 (ref 150–399)
WBC: 9.2

## 2018-03-16 LAB — BASIC METABOLIC PANEL
BUN: 15 (ref 4–21)
Creatinine: 0.7 (ref 0.5–1.1)
Glucose: 118
POTASSIUM: 4 (ref 3.4–5.3)
Sodium: 144 (ref 137–147)

## 2018-03-17 DIAGNOSIS — F325 Major depressive disorder, single episode, in full remission: Secondary | ICD-10-CM | POA: Diagnosis not present

## 2018-03-17 DIAGNOSIS — G3184 Mild cognitive impairment, so stated: Secondary | ICD-10-CM | POA: Diagnosis not present

## 2018-03-17 DIAGNOSIS — F418 Other specified anxiety disorders: Secondary | ICD-10-CM | POA: Diagnosis not present

## 2018-03-23 DIAGNOSIS — L89153 Pressure ulcer of sacral region, stage 3: Secondary | ICD-10-CM | POA: Diagnosis not present

## 2018-03-24 DIAGNOSIS — D649 Anemia, unspecified: Secondary | ICD-10-CM | POA: Diagnosis not present

## 2018-03-24 DIAGNOSIS — I10 Essential (primary) hypertension: Secondary | ICD-10-CM | POA: Diagnosis not present

## 2018-03-24 LAB — BASIC METABOLIC PANEL
BUN: 23 — AB (ref 4–21)
Creatinine: 0.6 (ref 0.5–1.1)
Glucose: 71
Potassium: 4 (ref 3.4–5.3)
SODIUM: 151 — AB (ref 137–147)

## 2018-03-24 LAB — CBC AND DIFFERENTIAL
HCT: 33 — AB (ref 36–46)
Hemoglobin: 10.7 — AB (ref 12.0–16.0)
Platelets: 275 (ref 150–399)
WBC: 9.8

## 2018-03-25 ENCOUNTER — Non-Acute Institutional Stay (SKILLED_NURSING_FACILITY): Payer: Medicare Other | Admitting: Internal Medicine

## 2018-03-25 ENCOUNTER — Encounter: Payer: Self-pay | Admitting: Internal Medicine

## 2018-03-25 DIAGNOSIS — S069X6D Unspecified intracranial injury with loss of consciousness greater than 24 hours without return to pre-existing conscious level with patient surviving, subsequent encounter: Secondary | ICD-10-CM | POA: Diagnosis not present

## 2018-03-25 DIAGNOSIS — I10 Essential (primary) hypertension: Secondary | ICD-10-CM

## 2018-03-25 DIAGNOSIS — F419 Anxiety disorder, unspecified: Secondary | ICD-10-CM

## 2018-03-25 NOTE — Progress Notes (Signed)
Location:  [place] Room Number: 211D Place of Service:  SNF [phone])  Autumn Delaine. [name], MD  [name], [name]  Extended Emergency Contact Information Primary Emergency Contact: [name] Address: [address]          [place], [place] [address] [name] of [place] Phone: [phone] Relation: Son Secondary Emergency Contact: [name],[name]          [place], [place] Relation: Sister    Allergies: Demerol [meperidine]  Chief Complaint  Patient presents with  . Medical Management of Chronic Issues    Routine visit    HPI: Patient is 83 y.o. female who is being seen for routine issues of hypertension, closed TBI, and anxiety.  Past Medical History:  Diagnosis Date  . Anasarca 05/21/2016  . Anxiety 11/03/2016  . Closed TBI (traumatic brain injury) ([place]) 04/21/2016  . Dysphagia 07/13/2016  . Gout   . Hypertension   . Obesity (BMI 30.0-34.9) 05/21/2016  . Status post insertion of percutaneous endoscopic gastrostomy (PEG) tube ([place]) 04/21/2016    Past Surgical History:  Procedure Laterality Date  . CHOLECYSTECTOMY N/A 07/21/2017   Procedure: LAPAROSCOPIC CHOLECYSTECTOMY;  Surgeon: Erroll Luna, MD;  Location: [facility];  Service: General;  Laterality: N/A;  . ERCP N/A 02/18/2018   Procedure: ENDOSCOPIC RETROGRADE CHOLANGIOPANCREATOGRAPHY (ERCP);  Surgeon: Milus Banister, MD;  Location: [facility];  Service: Endoscopy;  Laterality: N/A;  . INTRAMEDULLARY (IM) NAIL INTERTROCHANTERIC Right 12/27/2016   Procedure: INTRAMEDULLARY (IM) NAIL INTERTROCHANTRIC RIGHT HIP;  Surgeon: Paralee Cancel, MD;  Location: [facility];  Service: Orthopedics;  Laterality: Right;  . MASTECTOMY    . REMOVAL OF STONES  02/18/2018   Procedure: REMOVAL OF STONES;  Surgeon: Milus Banister, MD;  Location: [facility];  Service: Endoscopy;;  balloon sweep  . SPHINCTEROTOMY   02/18/2018   Procedure: SPHINCTEROTOMY;  Surgeon: Milus Banister, MD;  Location: [facility];  Service: Endoscopy;;    Allergies as of 03/25/2018      Reactions   Demerol [meperidine] Swelling      Medication List       Accurate as of March 25, 2018 11:59 PM. Always use your most recent med list.        ALPRAZolam 0.5 MG tablet Commonly known as:  XANAX Take 1 tablet (0.5 mg total) by mouth 2 (two) times daily.   bimatoprost 0.01 % Soln Commonly known as:  LUMIGAN Place 1 drop into both eyes at bedtime. For glaucoma   bisacodyl 10 MG suppository Commonly known as:  DULCOLAX Place 10 mg rectally daily as needed (constipation not relieved by MOM).   ENSURE Take 237 mLs by mouth. TID DUE TO WEIGHT LOSS ( CHOCOLATE IF AVAILABLE )   escitalopram 5 MG tablet Commonly known as:  LEXAPRO Take 5 mg by mouth daily.   feeding supplement (PRO-STAT SUGAR FREE 64) Liqd Take 30 mLs by mouth 2 (two) times daily.   FLEET ENEMA RE Place 1 each rectally daily as needed (constipation not relieved by bisacodyl suppository).   furosemide 20 MG tablet Commonly known as:  LASIX Take 20 mg by mouth daily.   gabapentin 600 MG tablet Commonly known as:  NEURONTIN Take 600 mg by mouth 3 (three) times daily.   MILK OF MAGNESIA PO Take 30 mLs by mouth daily as needed (if no BM in 3 days).   mirtazapine 7.5 MG tablet  Commonly known as:  REMERON Take 7.5 mg by mouth. TAKE 1 TABLET BY MOUTH AT BEDTIME FOR APPETITE STIMULANT   MULTIPLE VITAMINS-MINERALS PO Take by mouth. TAKE 1 TABLET BY MOUTH ONCE DAILY TO PROMOTE WOUND HEALING   oxyCODONE-acetaminophen 5-325 MG tablet Commonly known as:  PERCOCET/ROXICET Take 1 tablet by mouth every 8 (eight) hours.   sennosides-docusate sodium 8.6-50 MG tablet Commonly known as:  SENOKOT-S Take 2 tablets by mouth 2 (two) times daily as needed for constipation.   venlafaxine XR 75 MG 24 hr capsule Commonly known as:  EFFEXOR-XR Take 75 mg by  mouth 2 (two) times daily. FOR DEPRESSION (DO NOT CRUSH)       No orders of the defined types were placed in this encounter.   Immunization History  Administered Date(s) Administered  . Influenza-Unspecified 12/19/2016  . PPD Test 04/29/2016  . Pneumococcal Conjugate-13 08/06/2016  . Pneumococcal Polysaccharide-23 04/15/2016  . Tdap 03/31/2016    Social History   Tobacco Use  . Smoking status: Never Smoker  . Smokeless tobacco: Never Used  Substance Use Topics  . Alcohol use: Not Currently    Review of Systems  DATA OBTAINED: from patient-limited; nursing-no acute concerns GENERAL:  no fevers, fatigue, appetite changes SKIN: No itching, rash HEENT: No complaint RESPIRATORY: No cough, wheezing, SOB CARDIAC: No chest pain, palpitations, lower extremity edema  GI: No abdominal pain, No N/V/D or constipation, No heartburn or reflux  GU: No dysuria, frequency or urgency, or incontinence  MUSCULOSKELETAL: No unrelieved bone/joint pain NEUROLOGIC: No headache, dizziness  PSYCHIATRIC: No overt anxiety or sadness  Vitals:   03/25/18 1419  BP: 133/70  Pulse: 70  Resp: 18  Temp: (!) 97 F (36.1 C)   Body mass index is 26.26 kg/m. Physical Exam  GENERAL APPEARANCE: Alert, conversant, No acute distress  SKIN: No diaphoresis rash HEENT: Unremarkable RESPIRATORY: Breathing is even, unlabored. Lung sounds are clear   CARDIOVASCULAR: Heart RRR no murmurs, rubs or gallops. No peripheral edema  GASTROINTESTINAL: Abdomen is soft, non-tender, not distended w/ normal bowel sounds.  GENITOURINARY: Bladder non tender, not distended  MUSCULOSKELETAL: No abnormal joints or musculature NEUROLOGIC: Cranial nerves 2-12 grossly intact. Moves all extremities PSYCHIATRIC: Mood and affect with dementia, no behavioral issues  Patient Active Problem List   Diagnosis Date Noted  . MRSA (methicillin resistant Staphylococcus aureus) septicemia ([place]) 02/23/2018  . Hypokalemia 02/23/2018    . Bile duct stone   . E. coli sepsis ([place]) 02/17/2018  . Cholecystitis 07/31/2017  . Cholecystitis, acute with cholelithiasis 07/20/2017  . Pressure injury of skin 07/20/2017  . History of total right hip arthroplasty 01/05/2017  . Acute blood loss as cause of postoperative anemia 01/05/2017  . Escherichia coli urinary tract infection 01/05/2017  . Acute encephalopathy 01/05/2017  . Depression, recurrent ([place]) 01/05/2017  . Polyneuropathy 01/05/2017  . Closed right hip fracture ([facility]) 12/25/2016  . Anxiety 11/03/2016  . Dysphagia 07/13/2016  . Anasarca 05/21/2016  . Obesity (BMI 30.0-34.9) 05/21/2016  . Closed TBI (traumatic brain injury) ([place]) 04/21/2016  . Status post insertion of percutaneous endoscopic gastrostomy (PEG) tube ([place]) 04/21/2016  . Facial laceration 04/21/2016  . Cheek wound, right, subsequent encounter 04/21/2016  . Urinary tract infection due to extended-spectrum beta lactamase (ESBL) producing Escherichia coli 04/21/2016  . Hypotension 04/21/2016  . Gout   . Hypertension     CMP     Component Value Date/Time   NA 151 (A) 03/24/2018   NA 145 07/28/2017   K 4.0 03/24/2018  K 3.7 07/28/2017   CL 112 (H) 02/21/2018 0325   CO2 25 02/21/2018 0325   GLUCOSE 87 02/21/2018 0325   BUN 23 (A) 03/24/2018   CREATININE 0.6 03/24/2018   CREATININE 0.42 (L) 02/21/2018 0325   CREATININE 0.39 07/28/2017   CALCIUM 7.6 (L) 02/21/2018 0325   CALCIUM 8.1 07/28/2017   PROT 4.8 (L) 02/21/2018 0325   ALBUMIN 1.9 (L) 02/21/2018 0325   AST 30 02/21/2018 0325   ALT 33 02/21/2018 0325   ALKPHOS 324 (H) 02/21/2018 0325   BILITOT 1.1 02/21/2018 0325   GFRNONAA >60 02/21/2018 0325   GFRNONAA >90 07/28/2017   GFRAA >60 02/21/2018 0325   Recent Labs    02/16/18 2227  02/19/18 0311 02/20/18 0325 02/21/18 0325  03/12/18 03/16/18 03/24/18  NA  --    < > 145 147* 146*   < > 151* 144 151*  K  --    < > 3.3* 3.7 3.0*   < > 2.8* 4.0 4.0  CL  --    < > 115* 116* 112*  --   --    --   --   CO2  --    < > 24 24 25   --   --   --   --   GLUCOSE  --    < > 96 84 87  --   --   --   --   BUN  --    < > 12 10 8    < > 15 15 23*  CREATININE  --    < > 0.54 0.54 0.42*   < > 0.5 0.7 0.6  CALCIUM  --    < > 7.7* 7.9* 7.6*  --   --   --   --   MG 2.1  --   --   --   --   --   --   --   --    < > = values in this interval not displayed.   Recent Labs    02/19/18 0311 02/20/18 0325 02/21/18 0325  AST 71* 45* 30  ALT 56* 44 33  ALKPHOS 299* 358* 324*  BILITOT 2.2* 1.9* 1.1  PROT 5.0* 5.0* 4.8*  ALBUMIN 1.9* 2.1* 1.9*   Recent Labs    02/16/18 1915 02/17/18 0435 02/18/18 0308 02/19/18 0311 02/20/18 0325 02/21/18 0325 03/02/18 03/16/18 03/24/18  WBC 18.1* 19.0* 14.6* 9.9 10.8* 7.6 7.2 9.2 9.8  NEUTROABS 16.8* 16.7* 13.2*  --   --   --   --   --   --   HGB 12.5 10.1* 12.1 10.3* 10.4* 10.1* 11.1* 10.4* 10.7*  HCT 42.8 34.9* 40.8 35.6* 35.6* 34.1* 34* 32* 33*  MCV 89.5 90.6 87.9 89.7 90.4 89.0  --   --   --   PLT 265 193 124* 197 232 192 192 230 275   No results for input(s): CHOL, LDLCALC, TRIG in the last 8760 hours.  Invalid input(s): HCL No results found for: MICROALBUR Lab Results  Component Value Date   TSH 1.68 08/06/2013   No results found for: HGBA1C No results found for: CHOL, HDL, LDLCALC, LDLDIRECT, TRIG, CHOLHDL  Significant Diagnostic Results in last 30 days:  No results found.  Assessment and Plan  Hypertension Controlled; continue Lasix 20 mg daily  Closed TBI (traumatic brain injury) ([place]) Moderate to severe mental compromise; patient however recently has been doing very well mental status wise; continue supportive care  Anxiety Patient is anxiety is  controlled, recently patient seems happy; continue Xanax 0.5 mg twice daily    Autumn Burd D. [name], MD

## 2018-03-28 ENCOUNTER — Encounter: Payer: Self-pay | Admitting: Internal Medicine

## 2018-03-28 NOTE — Assessment & Plan Note (Signed)
Moderate to severe mental compromise; patient however recently has been doing very well mental status wise; continue supportive care

## 2018-03-28 NOTE — Assessment & Plan Note (Signed)
Controlled; continue Lasix 20 mg daily

## 2018-03-28 NOTE — Assessment & Plan Note (Signed)
Patient is anxiety is controlled, recently patient seems happy; continue Xanax 0.5 mg twice daily

## 2018-03-30 DIAGNOSIS — I10 Essential (primary) hypertension: Secondary | ICD-10-CM | POA: Diagnosis not present

## 2018-03-30 DIAGNOSIS — D649 Anemia, unspecified: Secondary | ICD-10-CM | POA: Diagnosis not present

## 2018-03-30 DIAGNOSIS — L89153 Pressure ulcer of sacral region, stage 3: Secondary | ICD-10-CM | POA: Diagnosis not present

## 2018-03-30 LAB — BASIC METABOLIC PANEL
BUN: 24 — AB (ref 4–21)
Creatinine: 0.8 (ref 0.5–1.1)
Glucose: 75
Potassium: 4.5 (ref 3.4–5.3)
Sodium: 154 — AB (ref 137–147)

## 2018-03-30 LAB — CBC AND DIFFERENTIAL
HCT: 31 — AB (ref 36–46)
Hemoglobin: 10.6 — AB (ref 12.0–16.0)
Platelets: 236 (ref 150–399)
WBC: 9.1

## 2018-04-02 ENCOUNTER — Encounter: Payer: Self-pay | Admitting: Internal Medicine

## 2018-04-02 ENCOUNTER — Non-Acute Institutional Stay (SKILLED_NURSING_FACILITY): Payer: Medicare Other | Admitting: Internal Medicine

## 2018-04-02 DIAGNOSIS — E870 Hyperosmolality and hypernatremia: Secondary | ICD-10-CM | POA: Diagnosis not present

## 2018-04-02 NOTE — Progress Notes (Signed)
:  Location:  [facility] Room Number: 206D Place of Service:  SNF (31)  Autumn Delaine. [name], MD  [name], [name]  Extended Emergency Contact Information Primary Emergency Contact: [name] Address: [address]          [place], [place] [address] [name] [employer] Phone: [phone] Relation: Son Secondary Emergency Contact: [name],[name]          [place], [place] Relation: Sister     Allergies: Demerol [meperidine]  Chief Complaint  Patient presents with  . Acute Visit    Hypernatremia    HPI: Patient is 83 y.o. female who is being seen today for an abnormal lab value on a routine lab.  Patient's sodium is 154, it has been elevated before, which is puzzling to everyone and she drinks really well.  Patient is awake and alert and at her baseline.  Patient has been reported to be drinking well today.  Past Medical History:  Diagnosis Date  . Anasarca 05/21/2016  . Anxiety 11/03/2016  . Closed TBI (traumatic brain injury) ([place]) 04/21/2016  . Dysphagia 07/13/2016  . Gout   . Hypertension   . Obesity (BMI 30.0-34.9) 05/21/2016  . Status post insertion of percutaneous endoscopic gastrostomy (PEG) tube ([place]) 04/21/2016    Past Surgical History:  Procedure Laterality Date  . CHOLECYSTECTOMY N/A 07/21/2017   Procedure: LAPAROSCOPIC CHOLECYSTECTOMY;  Surgeon: Erroll Luna, MD;  Location: [facility];  Service: General;  Laterality: N/A;  . ERCP N/A 02/18/2018   Procedure: ENDOSCOPIC RETROGRADE CHOLANGIOPANCREATOGRAPHY (ERCP);  Surgeon: Milus Banister, MD;  Location: [facility];  Service: Endoscopy;  Laterality: N/A;  . INTRAMEDULLARY (IM) NAIL INTERTROCHANTERIC Right 12/27/2016   Procedure: INTRAMEDULLARY (IM) NAIL INTERTROCHANTRIC RIGHT HIP;  Surgeon: Paralee Cancel, MD;  Location: [facility];  Service: Orthopedics;  Laterality: Right;  . MASTECTOMY    .  REMOVAL OF STONES  02/18/2018   Procedure: REMOVAL OF STONES;  Surgeon: Milus Banister, MD;  Location: [facility];  Service: Endoscopy;;  balloon sweep  . SPHINCTEROTOMY  02/18/2018   Procedure: SPHINCTEROTOMY;  Surgeon: Milus Banister, MD;  Location: [facility];  Service: Endoscopy;;    Allergies as of 04/02/2018      Reactions   Demerol [meperidine] Swelling      Medication List       Accurate as of April 02, 2018 12:56 PM. Always use your most recent med list.        acetaminophen 325 MG tablet Commonly known as:  TYLENOL Take 650 mg by mouth every 6 (six) hours as needed.   ALPRAZolam 0.5 MG tablet Commonly known as:  XANAX Take 1 tablet (0.5 mg total) by mouth 2 (two) times daily.   bimatoprost 0.01 % Soln Commonly known as:  LUMIGAN Place 1 drop into both eyes at bedtime. For glaucoma   bisacodyl 10 MG suppository Commonly known as:  DULCOLAX Place 10 mg rectally daily as needed (constipation not relieved by MOM).   ENSURE Take 237 mLs by mouth. TID DUE TO WEIGHT LOSS ( CHOCOLATE IF AVAILABLE )   escitalopram 5 MG tablet Commonly known as:  LEXAPRO Take 5 mg by mouth daily.   feeding supplement (PRO-STAT SUGAR FREE 64) Liqd Take 30 mLs by mouth 2 (two) times daily.   FLEET ENEMA RE Place 1 each rectally daily as needed (constipation not relieved by bisacodyl suppository).  furosemide 20 MG tablet Commonly known as:  LASIX Take 20 mg by mouth daily.   gabapentin 600 MG tablet Commonly known as:  NEURONTIN Take 600 mg by mouth 3 (three) times daily.   MILK OF MAGNESIA PO Take 30 mLs by mouth daily as needed (if no BM in 3 days).   mirtazapine 7.5 MG tablet Commonly known as:  REMERON Take 7.5 mg by mouth. TAKE 1 TABLET BY MOUTH AT BEDTIME FOR APPETITE STIMULANT   MULTIPLE VITAMINS-MINERALS PO Take by mouth. TAKE 1 TABLET BY MOUTH ONCE DAILY TO PROMOTE WOUND HEALING   oxyCODONE-acetaminophen 5-325 MG tablet Commonly known as:   PERCOCET/ROXICET Take 1 tablet by mouth every 8 (eight) hours.   sennosides-docusate sodium 8.6-50 MG tablet Commonly known as:  SENOKOT-S Take 2 tablets by mouth 2 (two) times daily as needed for constipation.   venlafaxine XR 75 MG 24 hr capsule Commonly known as:  EFFEXOR-XR Take 75 mg by mouth 2 (two) times daily. FOR DEPRESSION (DO NOT CRUSH)       No orders of the defined types were placed in this encounter.   Immunization History  Administered Date(s) Administered  . Influenza-Unspecified 12/19/2016  . PPD Test 04/29/2016  . Pneumococcal Conjugate-13 08/06/2016  . Pneumococcal Polysaccharide-23 04/15/2016  . Tdap 03/31/2016    Social History   Tobacco Use  . Smoking status: Never Smoker  . Smokeless tobacco: Never Used  Substance Use Topics  . Alcohol use: Not Currently    Family history is   Family History  Problem Relation Age of Onset  . Asthma Mother   . Anesthesia problems Neg Hx   . Arthritis Neg Hx   . Cancer Neg Hx   . Cerebral palsy Neg Hx   . Clotting disorder Neg Hx   . Club foot Neg Hx   . Collagen disease Neg Hx   . Deep vein thrombosis Neg Hx   . Gait disorder Neg Hx   . Heart disease Neg Hx   . Gout Neg Hx   . Diabetes Neg Hx   . Hip dysplasia Neg Hx   . Hip fracture Neg Hx   . Hypermobility Neg Hx   . Hypertension Neg Hx   . Osteoporosis Neg Hx   . Other Neg Hx   . Stroke Neg Hx   . Thyroid disease Neg Hx   . Vasculitis Neg Hx   . Spina bifida Neg Hx   . Scoliosis Neg Hx   . Rheumatologic disease Neg Hx   . Pulmonary embolism Neg Hx       Review of Systems  DATA OBTAINED: from patient-very limited; nursing- no acute concerns patient is not acting in any way different at all GENERAL:  no fevers, fatigue, appetite changes SKIN: No itching, or rash EYES: No eye pain, redness, discharge EARS: No earache, tinnitus, change in hearing NOSE: No congestion, drainage or bleeding  MOUTH/THROAT: No mouth or tooth pain, No sore  throat RESPIRATORY: No cough, wheezing, SOB CARDIAC: No chest pain, palpitations, lower extremity edema  GI: No abdominal pain, No N/V/D or constipation, No heartburn or reflux  GU: No dysuria, frequency or urgency, or incontinence  MUSCULOSKELETAL: No unrelieved bone/joint pain NEUROLOGIC: No headache, dizziness or focal weakness PSYCHIATRIC: No c/o anxiety or sadness   Vitals:   04/02/18 1249  BP: 110/64  Pulse: 73  Resp: 16  Temp: 98 F (36.7 C)    SpO2 Readings from Last 1 Encounters:  02/22/18 96%   Body  mass index is 26.13 kg/m.     Physical Exam  GENERAL APPEARANCE: Alert, conversant,  No acute distress.  SKIN: No diaphoresis rash HEAD: Normocephalic, atraumatic  EYES: Conjunctiva/lids clear. Pupils round, reactive. EOMs intact.  EARS: External exam WNL, canals clear. Hearing grossly normal.  NOSE: No deformity or discharge.  MOUTH/THROAT: Lips w/o lesions  RESPIRATORY: Breathing is even, unlabored. Lung sounds are clear   CARDIOVASCULAR: Heart RRR no murmurs, rubs or gallops. No peripheral edema.   GASTROINTESTINAL: Abdomen is soft, non-tender, not distended w/ normal bowel sounds. GENITOURINARY: Bladder non tender, not distended  MUSCULOSKELETAL: No abnormal joints or musculature NEUROLOGIC:  Cranial nerves 2-12 grossly intact. Moves all extremities  PSYCHIATRIC: Mood and affect status post TBI with some dementia no behavioral issues  Patient Active Problem List   Diagnosis Date Noted  . MRSA (methicillin resistant Staphylococcus aureus) septicemia ([place]) 02/23/2018  . Hypokalemia 02/23/2018  . Bile duct stone   . E. coli sepsis ([place]) 02/17/2018  . Cholecystitis 07/31/2017  . Cholecystitis, acute with cholelithiasis 07/20/2017  . Pressure injury of skin 07/20/2017  . History of total right hip arthroplasty 01/05/2017  . Acute blood loss as cause of postoperative anemia 01/05/2017  . Escherichia coli urinary tract infection 01/05/2017  . Acute  encephalopathy 01/05/2017  . Depression, recurrent ([place]) 01/05/2017  . Polyneuropathy 01/05/2017  . Closed right hip fracture ([place]) 12/25/2016  . Anxiety 11/03/2016  . Dysphagia 07/13/2016  . Anasarca 05/21/2016  . Obesity (BMI 30.0-34.9) 05/21/2016  . Closed TBI (traumatic brain injury) ([place]) 04/21/2016  . Status post insertion of percutaneous endoscopic gastrostomy (PEG) tube ([place]) 04/21/2016  . Facial laceration 04/21/2016  . Cheek wound, right, subsequent encounter 04/21/2016  . Urinary tract infection due to extended-spectrum beta lactamase (ESBL) producing Escherichia coli 04/21/2016  . Hypotension 04/21/2016  . Gout   . Hypertension       Labs reviewed: Basic Metabolic Panel:    Component Value Date/Time   NA 154 (A) 03/30/2018   NA 145 07/28/2017   K 4.5 03/30/2018   K 3.7 07/28/2017   CL 112 (H) 02/21/2018 0325   CO2 25 02/21/2018 0325   GLUCOSE 87 02/21/2018 0325   BUN 24 (A) 03/30/2018   CREATININE 0.8 03/30/2018   CREATININE 0.42 (L) 02/21/2018 0325   CREATININE 0.39 07/28/2017   CALCIUM 7.6 (L) 02/21/2018 0325   CALCIUM 8.1 07/28/2017   PROT 4.8 (L) 02/21/2018 0325   ALBUMIN 1.9 (L) 02/21/2018 0325   AST 30 02/21/2018 0325   ALT 33 02/21/2018 0325   ALKPHOS 324 (H) 02/21/2018 0325   BILITOT 1.1 02/21/2018 0325   GFRNONAA >60 02/21/2018 0325   GFRNONAA >90 07/28/2017   GFRAA >60 02/21/2018 0325    Recent Labs    02/16/18 2227  02/19/18 0311 02/20/18 0325 02/21/18 0325  03/16/18 03/24/18 03/30/18  NA  --    < > 145 147* 146*   < > 144 151* 154*  K  --    < > 3.3* 3.7 3.0*   < > 4.0 4.0 4.5  CL  --    < > 115* 116* 112*  --   --   --   --   CO2  --    < > 24 24 25   --   --   --   --   GLUCOSE  --    < > 96 84 87  --   --   --   --   BUN  --    < >  12 10 8    < > 15 23* 24*  CREATININE  --    < > 0.54 0.54 0.42*   < > 0.7 0.6 0.8  CALCIUM  --    < > 7.7* 7.9* 7.6*  --   --   --   --   MG 2.1  --   --   --   --   --   --   --   --    < > = values  in this interval not displayed.   Liver Function Tests: Recent Labs    02/19/18 0311 02/20/18 0325 02/21/18 0325  AST 71* 45* 30  ALT 56* 44 33  ALKPHOS 299* 358* 324*  BILITOT 2.2* 1.9* 1.1  PROT 5.0* 5.0* 4.8*  ALBUMIN 1.9* 2.1* 1.9*   Recent Labs    02/17/18 0325  LIPASE 21   Recent Labs    02/17/18 0435  AMMONIA 25   CBC: Recent Labs    02/16/18 1915 02/17/18 0435 02/18/18 0308 02/19/18 0311 02/20/18 0325 02/21/18 0325  03/16/18 03/24/18 03/30/18  WBC 18.1* 19.0* 14.6* 9.9 10.8* 7.6   < > 9.2 9.8 9.1  NEUTROABS 16.8* 16.7* 13.2*  --   --   --   --   --   --   --   HGB 12.5 10.1* 12.1 10.3* 10.4* 10.1*   < > 10.4* 10.7* 10.6*  HCT 42.8 34.9* 40.8 35.6* 35.6* 34.1*   < > 32* 33* 31*  MCV 89.5 90.6 87.9 89.7 90.4 89.0  --   --   --   --   PLT 265 193 124* 197 232 192   < > 230 275 236   < > = values in this interval not displayed.   Lipid No results for input(s): CHOL, HDL, LDLCALC, TRIG in the last 8760 hours.  Cardiac Enzymes: Recent Labs    02/17/18 0325  TROPONINI 0.04*   BNP: No results for input(s): BNP in the last 8760 hours. No results found for: MICROALBUR No results found for: HGBA1C Lab Results  Component Value Date   TSH 1.68 08/06/2013   No results found for: VITAMINB12 No results found for: FOLATE No results found for: IRON, TIBC, FERRITIN  Imaging and Procedures obtained prior to SNF admission: Ct Head Wo Contrast  Result Date: 02/17/2018 CLINICAL DATA:  Fever and altered mental status for 1 day. EXAM: CT HEAD WITHOUT CONTRAST TECHNIQUE: Contiguous axial images were obtained from the base of the skull through the vertex without intravenous contrast. COMPARISON:  10/18/2017 FINDINGS: Brain: Patient positioning limits examination. Area of encephalomalacia demonstrated in the right frontal lobe without change since prior study. Mild diffuse cerebral atrophy. Ventricular dilatation, partially related to encephalomalacia. Low-attenuation  changes in the deep white matter likely represent small vessel ischemia. No change in appearance of intracranial contents since previous study. No acute mass effect or abnormal extra-axial fluid collections. No acute intracranial hemorrhage. Vascular: Intracranial arterial vascular calcifications are present in the vertebrobasilar and carotid arteries. Skull: Focal cranial ostomy in the right anterior frontal region. No acute depressed skull fractures identified. Sinuses/Orbits: Opacification of a posterior right ethmoid air cells. Paranasal sinuses are otherwise clear. Mastoid air cells are clear. No change since prior study. Other: None. IMPRESSION: No acute intracranial abnormalities. Chronic atrophy and small vessel ischemic changes. Area of old encephalomalacia in the right frontal lobe unchanged. Electronically Signed   By: Lucienne Capers M.D.   On: 02/17/2018 03:06   US Abdomen  Limited  Result Date: 02/16/2018 CLINICAL DATA:  83 y/o F; abdominal pain and abnormal liver function test. EXAM: ULTRASOUND ABDOMEN LIMITED RIGHT UPPER QUADRANT COMPARISON:  None. FINDINGS: Gallbladder: Not identified. Common bile duct: Diameter: 7 mm Liver: No focal lesion identified. Within normal limits in parenchymal echogenicity. Portal vein is patent on color Doppler imaging with normal direction of blood flow towards the liver. IMPRESSION: Gallbladder not identified. Otherwise unremarkable right upper quadrant ultrasound. Electronically Signed   By: Kristine Garbe M.D.   On: 02/16/2018 22:45   Dg Chest Port 1 View  Result Date: 02/16/2018 CLINICAL DATA:  Fever EXAM: PORTABLE CHEST 1 VIEW COMPARISON:  July 19, 2017 FINDINGS: There is mild scarring in the left lower lobe. There is no edema or consolidation. Heart size and pulmonary vascularity are normal. No adenopathy. There is aortic atherosclerosis. Bones are osteoporotic. There are postoperative changes in the right breast region. There is evidence of an  old fracture of the right proximal humerus with remodeling in this area. There is chronic anterior dislocation of the right shoulder. IMPRESSION: Scarring left base region. No edema or consolidation. Stable cardiac silhouette. There is aortic atherosclerosis. Bones osteoporotic. Chronic anterior dislocation right shoulder with evidence of old healed fracture right proximal humerus with remodeling. Postoperative change right breast region. Aortic Atherosclerosis (ICD10-I70.0). Electronically Signed   By: Lowella Grip III M.D.   On: 02/16/2018 19:42   Ct Renal Stone Study  Result Date: 02/17/2018 CLINICAL DATA:  83 y/o  F; fever and flank pain. EXAM: CT ABDOMEN AND PELVIS WITHOUT CONTRAST TECHNIQUE: Multidetector CT imaging of the abdomen and pelvis was performed following the standard protocol without IV contrast. COMPARISON:  None. FINDINGS: Lower chest: Small bilateral pleural effusions. Ill-defined bilateral dependent lower lobe opacities may represent atelectasis or pneumonia. 5 mm nodules in the right middle and lower lobes (series 3, image 2 and 4) and 7 mm nodule in the right middle lobe (series 3, image 11). Hepatobiliary: Diffuse intra biliary ductal dilatation. Multiple biliary stones within the common bile duct (series 4, image 51). Cholecystectomy. Pancreas: Unremarkable. No pancreatic ductal dilatation or surrounding inflammatory changes. Spleen: Normal in size without focal abnormality. Adrenals/Urinary Tract: Normal adrenal glands. Normal right kidney and ureter. 6 mm stone in the proximal left ureter with mild left pelvicaliectasis (series 2, image 32). The downstream ureter is also mildly distended, however, no radiopaque stone is identified. Multiple bladder diverticula. Stomach/Bowel: Stomach is within normal limits. Appendix appears normal. No evidence of bowel wall thickening, distention, or inflammatory changes. Vascular/Lymphatic: Aortic atherosclerosis. No enlarged abdominal or pelvic  lymph nodes. Reproductive: Uterus and bilateral adnexa are unremarkable. Other: No abdominal wall hernia or abnormality. No abdominopelvic ascites. Musculoskeletal: Severe T12, mild L3, mild L4 compression deformities. L4-5 grade 1 anterolisthesis. Chronic right proximal femur fracture intramedullary nail and proximal lag screw. 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 IMPRESSION: 1. Intrahepatic biliary ductal dilatation and multiple stones within the common bile duct. 2. Multiple pulmonary nodules measuring up to 7 mm. Non-contrast chest CT at 3-6 months is recommended. If the nodules are stable at time of repeat CT, then future CT at 18-24 months (from today's scan) is considered optional for low-risk patients, but is recommended for high-risk patients. This recommendation follows the consensus statement: Guidelines for Management of Incidental Pulmonary Nodules Detected on CT Images: From the Fleischner Society 2017; Radiology 2017; 284:228-243. 3. Small bilateral pleural effusions and small dependent lower lobe opacities which may represent  associated atelectasis or pneumonia. 4. 6 mm stone in left proximal ureter with mild proximal pelvicaliectasis. 5. Chronic appearing T12, L3, L4 compression deformities. Electronically Signed   By: Kristine Garbe M.D.   On: 02/17/2018 03:11     Not all labs, radiology exams or other studies done during hospitalization come through on my EPIC note; however they are reviewed by me.    Assessment and Plan  Hypernatremia- patient's sodium is 154, patient's GFR is 74 patient's BUN is 24 and creatinine is 0.76; patient's calculated free water deficit is 3.2 L; will start one half normal saline IV fluid at 75 cc an hour x4 L as well as 120 cc p.o. every 2 hours while awake (8 times a day); BMP tomorrow and on Monday morning unless patient has a mental status change.    Autumn Delaine. [name],  MD

## 2018-04-04 DIAGNOSIS — I10 Essential (primary) hypertension: Secondary | ICD-10-CM | POA: Diagnosis not present

## 2018-04-04 DIAGNOSIS — D649 Anemia, unspecified: Secondary | ICD-10-CM | POA: Diagnosis not present

## 2018-04-04 LAB — BASIC METABOLIC PANEL
BUN: 26 — AB (ref 4–21)
Creatinine: 0.6 (ref 0.5–1.1)
Glucose: 82
Potassium: 3.8 (ref 3.4–5.3)
Sodium: 152 — AB (ref 137–147)

## 2018-04-05 ENCOUNTER — Encounter: Payer: Self-pay | Admitting: Internal Medicine

## 2018-04-06 DIAGNOSIS — I10 Essential (primary) hypertension: Secondary | ICD-10-CM | POA: Diagnosis not present

## 2018-04-06 DIAGNOSIS — Z79899 Other long term (current) drug therapy: Secondary | ICD-10-CM | POA: Diagnosis not present

## 2018-04-06 DIAGNOSIS — L89153 Pressure ulcer of sacral region, stage 3: Secondary | ICD-10-CM | POA: Diagnosis not present

## 2018-04-06 DIAGNOSIS — D649 Anemia, unspecified: Secondary | ICD-10-CM | POA: Diagnosis not present

## 2018-04-06 LAB — BASIC METABOLIC PANEL
BUN: 14 (ref 4–21)
BUN: 16 (ref 4–21)
Creatinine: 0.5 (ref 0.5–1.1)
Creatinine: 0.6 (ref 0.5–1.1)
GLUCOSE: 70
Glucose: 73
Potassium: 3.2 — AB (ref 3.4–5.3)
Potassium: 3.8 (ref 3.4–5.3)
Sodium: 151 — AB (ref 137–147)
Sodium: 153 — AB (ref 137–147)

## 2018-04-06 LAB — CBC AND DIFFERENTIAL
HCT: 32 — AB (ref 36–46)
Hemoglobin: 10.4 — AB (ref 12.0–16.0)
Platelets: 214 (ref 150–399)
WBC: 7.4

## 2018-04-07 DIAGNOSIS — I10 Essential (primary) hypertension: Secondary | ICD-10-CM | POA: Diagnosis not present

## 2018-04-07 DIAGNOSIS — D649 Anemia, unspecified: Secondary | ICD-10-CM | POA: Diagnosis not present

## 2018-04-08 DIAGNOSIS — I10 Essential (primary) hypertension: Secondary | ICD-10-CM | POA: Diagnosis not present

## 2018-04-08 DIAGNOSIS — D649 Anemia, unspecified: Secondary | ICD-10-CM | POA: Diagnosis not present

## 2018-04-08 LAB — BASIC METABOLIC PANEL
BUN: 19 (ref 4–21)
Creatinine: 0.6 (ref 0.5–1.1)
Glucose: 74
Potassium: 3.3 — AB (ref 3.4–5.3)
Sodium: 148 — AB (ref 137–147)

## 2018-04-09 DIAGNOSIS — D649 Anemia, unspecified: Secondary | ICD-10-CM | POA: Diagnosis not present

## 2018-04-09 DIAGNOSIS — E870 Hyperosmolality and hypernatremia: Secondary | ICD-10-CM | POA: Diagnosis not present

## 2018-04-10 DIAGNOSIS — D649 Anemia, unspecified: Secondary | ICD-10-CM | POA: Diagnosis not present

## 2018-04-10 DIAGNOSIS — E870 Hyperosmolality and hypernatremia: Secondary | ICD-10-CM | POA: Diagnosis not present

## 2018-04-10 LAB — BASIC METABOLIC PANEL
BUN: 22 — AB (ref 4–21)
Creatinine: 0.6 (ref 0.5–1.1)
Glucose: 79
Potassium: 3.8 (ref 3.4–5.3)
SODIUM: 148 — AB (ref 137–147)

## 2018-04-11 DIAGNOSIS — E870 Hyperosmolality and hypernatremia: Secondary | ICD-10-CM | POA: Diagnosis not present

## 2018-04-11 DIAGNOSIS — D649 Anemia, unspecified: Secondary | ICD-10-CM | POA: Diagnosis not present

## 2018-04-11 LAB — BASIC METABOLIC PANEL
BUN: 22 — AB (ref 4–21)
Creatinine: 0.5 (ref 0.5–1.1)
Glucose: 75
Potassium: 3.3 — AB (ref 3.4–5.3)
Sodium: 149 — AB (ref 137–147)

## 2018-04-13 DIAGNOSIS — D649 Anemia, unspecified: Secondary | ICD-10-CM | POA: Diagnosis not present

## 2018-04-13 DIAGNOSIS — I10 Essential (primary) hypertension: Secondary | ICD-10-CM | POA: Diagnosis not present

## 2018-04-13 DIAGNOSIS — L89153 Pressure ulcer of sacral region, stage 3: Secondary | ICD-10-CM | POA: Diagnosis not present

## 2018-04-14 DIAGNOSIS — R4589 Other symptoms and signs involving emotional state: Secondary | ICD-10-CM | POA: Diagnosis not present

## 2018-04-14 DIAGNOSIS — F331 Major depressive disorder, recurrent, moderate: Secondary | ICD-10-CM | POA: Diagnosis not present

## 2018-04-14 DIAGNOSIS — F411 Generalized anxiety disorder: Secondary | ICD-10-CM | POA: Diagnosis not present

## 2018-04-20 DIAGNOSIS — L89153 Pressure ulcer of sacral region, stage 3: Secondary | ICD-10-CM | POA: Diagnosis not present

## 2018-04-20 DIAGNOSIS — D649 Anemia, unspecified: Secondary | ICD-10-CM | POA: Diagnosis not present

## 2018-04-20 DIAGNOSIS — I10 Essential (primary) hypertension: Secondary | ICD-10-CM | POA: Diagnosis not present

## 2018-04-21 ENCOUNTER — Other Ambulatory Visit: Payer: Self-pay

## 2018-04-21 MED ORDER — OXYCODONE-ACETAMINOPHEN 5-325 MG PO TABS: 1.0000 | ORAL_TABLET | Freq: Three times a day (TID) | ORAL | 0 refills | Status: DC

## 2018-04-22 DIAGNOSIS — I10 Essential (primary) hypertension: Secondary | ICD-10-CM | POA: Diagnosis not present

## 2018-04-22 DIAGNOSIS — D649 Anemia, unspecified: Secondary | ICD-10-CM | POA: Diagnosis not present

## 2018-04-22 LAB — BASIC METABOLIC PANEL
BUN: 20 (ref 4–21)
Creatinine: 0.6 (ref 0.5–1.1)
GLUCOSE: 83
Potassium: 3.9 (ref 3.4–5.3)
Sodium: 146 (ref 137–147)

## 2018-04-23 ENCOUNTER — Encounter: Payer: Self-pay | Admitting: Internal Medicine

## 2018-04-23 ENCOUNTER — Non-Acute Institutional Stay (SKILLED_NURSING_FACILITY): Payer: Medicare Other | Admitting: Internal Medicine

## 2018-04-23 DIAGNOSIS — G629 Polyneuropathy, unspecified: Secondary | ICD-10-CM | POA: Diagnosis not present

## 2018-04-23 DIAGNOSIS — R1310 Dysphagia, unspecified: Secondary | ICD-10-CM | POA: Diagnosis not present

## 2018-04-23 DIAGNOSIS — D649 Anemia, unspecified: Secondary | ICD-10-CM | POA: Diagnosis not present

## 2018-04-23 DIAGNOSIS — M109 Gout, unspecified: Secondary | ICD-10-CM | POA: Diagnosis not present

## 2018-04-23 DIAGNOSIS — I10 Essential (primary) hypertension: Secondary | ICD-10-CM | POA: Diagnosis not present

## 2018-04-23 NOTE — Progress Notes (Signed)
Location:  [facility] Room Number: 206D Place of Service:  SNF [phone])  [name], MD  [name], [name]  Extended Emergency Contact Information Primary Emergency Contact: [name] Address: [address]          [place], [place] [address] [name] of [place] Phone: [phone] Relation: Son Secondary Emergency Contact: [name],[name]          [place], [place] Relation: Sister    Allergies: Demerol [meperidine]  Chief Complaint  Patient presents with  . Medical Management of Chronic Issues    Routine visit    HPI: Patient is 83 y.o. female who is being seen for routine issues of dysphasia, polyneuropathy, and gout.  Past Medical History:  Diagnosis Date  . Anasarca 05/21/2016  . Anxiety 11/03/2016  . Closed TBI (traumatic brain injury) ([place]) 04/21/2016  . Dysphagia 07/13/2016  . Gout   . Hypertension   . Obesity (BMI 30.0-34.9) 05/21/2016  . Status post insertion of percutaneous endoscopic gastrostomy (PEG) tube ([facility]) 04/21/2016    Past Surgical History:  Procedure Laterality Date  . CHOLECYSTECTOMY N/A 07/21/2017   Procedure: LAPAROSCOPIC CHOLECYSTECTOMY;  Surgeon: Erroll Luna, MD;  Location: [facility];  Service: General;  Laterality: N/A;  . ERCP N/A 02/18/2018   Procedure: ENDOSCOPIC RETROGRADE CHOLANGIOPANCREATOGRAPHY (ERCP);  Surgeon: Milus Banister, MD;  Location: [facility];  Service: Endoscopy;  Laterality: N/A;  . INTRAMEDULLARY (IM) NAIL INTERTROCHANTERIC Right 12/27/2016   Procedure: INTRAMEDULLARY (IM) NAIL INTERTROCHANTRIC RIGHT HIP;  Surgeon: Paralee Cancel, MD;  Location: [facility];  Service: Orthopedics;  Laterality: Right;  . MASTECTOMY    . REMOVAL OF STONES  02/18/2018   Procedure: REMOVAL OF STONES;  Surgeon: Milus Banister, MD;  [facility];  Service: Endoscopy;;  balloon sweep  . SPHINCTEROTOMY   02/18/2018   Procedure: SPHINCTEROTOMY;  Surgeon: Milus Banister, MD;  Location: [facility];  Service: Endoscopy;;    Allergies as of 04/23/2018      Reactions   Demerol [meperidine] Swelling      Medication List       Accurate as of April 23, 2018 11:59 PM. Always use your most recent med list.        acetaminophen 325 MG tablet Commonly known as:  TYLENOL Take 650 mg by mouth every 6 (six) hours as needed.   ALPRAZolam 0.5 MG tablet Commonly known as:  XANAX Take 1 tablet (0.5 mg total) by mouth 2 (two) times daily.   bimatoprost 0.01 % Soln Commonly known as:  LUMIGAN Place 1 drop into both eyes at bedtime. For glaucoma   bisacodyl 10 MG suppository Commonly known as:  DULCOLAX Place 10 mg rectally daily as needed (constipation not relieved by MOM).   Ensure Take 237 mLs by mouth. TID DUE TO WEIGHT LOSS ( CHOCOLATE IF AVAILABLE )   escitalopram 5 MG tablet Commonly known as:  LEXAPRO Take 5 mg by mouth daily.   feeding supplement (PRO-STAT SUGAR FREE 64) Liqd Take 30 mLs by mouth 2 (two) times daily.   FLEET ENEMA RE Place 1 each rectally daily as needed (constipation not relieved by bisacodyl suppository).   furosemide 20 MG tablet Commonly known as:  LASIX Take 20 mg by mouth daily.   gabapentin 600 MG tablet Commonly known as:  NEURONTIN Take 600 mg by mouth 3 (three) times daily.   MILK OF MAGNESIA  PO Take 30 mLs by mouth daily as needed (if no BM in 3 days).   mirtazapine 7.5 MG tablet Commonly known as:  REMERON Take 7.5 mg by mouth. TAKE 1 TABLET BY MOUTH AT BEDTIME FOR APPETITE STIMULANT   MULTIPLE VITAMINS-MINERALS PO Take by mouth. TAKE 1 TABLET BY MOUTH ONCE DAILY TO PROMOTE WOUND HEALING   oxyCODONE-acetaminophen 5-325 MG tablet Commonly known as:  PERCOCET/ROXICET Take 1 tablet by mouth every 8 (eight) hours.   potassium chloride SA 20 MEQ tablet Commonly known as:  K-DUR,KLOR-CON Take 20 mEq by mouth 2 (two) times daily.    sennosides-docusate sodium 8.6-50 MG tablet Commonly known as:  SENOKOT-S Take 2 tablets by mouth 2 (two) times daily as needed for constipation.   sodium chloride 0.45 % Inject into the vein. administer 75 ml/hr via IV x 3 liters   venlafaxine XR 75 MG 24 hr capsule Commonly known as:  EFFEXOR-XR Take 75 mg by mouth 2 (two) times daily. FOR DEPRESSION (DO NOT CRUSH)       No orders of the defined types were placed in this encounter.   Immunization History  Administered Date(s) Administered  . Influenza-Unspecified 12/19/2016  . PPD Test 04/29/2016  . Pneumococcal Conjugate-13 08/06/2016  . Pneumococcal Polysaccharide-23 04/15/2016  . Tdap 03/31/2016    Social History   Tobacco Use  . Smoking status: Never Smoker  . Smokeless tobacco: Never Used  Substance Use Topics  . Alcohol use: Not Currently    Review of Systems  DATA OBTAINED: from patient- limited by dementia; nursing-no acute concerns except somewhat poor appetite GENERAL:  no fevers, fatigue, appetite changes SKIN: No itching, rash HEENT: No complaint RESPIRATORY: No cough, wheezing, SOB CARDIAC: No chest pain, palpitations, lower extremity edema  GI: No abdominal pain, No N/V/D or constipation, No heartburn or reflux  GU: No dysuria, frequency or urgency, or incontinence  MUSCULOSKELETAL: No unrelieved bone/joint pain NEUROLOGIC: No headache, dizziness  PSYCHIATRIC: No overt anxiety or sadness  Vitals:   04/23/18 1125  BP: 125/62  Pulse: 74  Resp: 18  Temp: 98 F (36.7 C)   Body mass index is 22.6 kg/m. Physical Exam  GENERAL APPEARANCE: Alert, conversant, No acute distress  SKIN: No diaphoresis rash HEENT: Unremarkable RESPIRATORY: Breathing is even, unlabored. Lung sounds are clear   CARDIOVASCULAR: Heart RRR no murmurs, rubs or gallops. No peripheral edema  GASTROINTESTINAL: Abdomen is soft, non-tender, not distended w/ normal bowel sounds.  GENITOURINARY: Bladder non tender, not  distended  MUSCULOSKELETAL: No abnormal joints or musculature NEUROLOGIC: Cranial nerves 2-12 grossly intact. Moves all extremities PSYCHIATRIC: Mood and affect with dementia, no behavioral issues  Patient Active Problem List   Diagnosis Date Noted  . MRSA (methicillin resistant Staphylococcus aureus) septicemia ([facility]) 02/23/2018  . Hypokalemia 02/23/2018  . Bile duct stone   . E. coli sepsis ([place]) 02/17/2018  . Cholecystitis 07/31/2017  . Cholecystitis, acute with cholelithiasis 07/20/2017  . Pressure injury of skin 07/20/2017  . History of total right hip arthroplasty 01/05/2017  . Acute blood loss as cause of postoperative anemia 01/05/2017  . Escherichia coli urinary tract infection 01/05/2017  . Acute encephalopathy 01/05/2017  . Depression, recurrent ([place]) 01/05/2017  . Polyneuropathy 01/05/2017  . Closed right hip fracture ([facility]) 12/25/2016  . Anxiety 11/03/2016  . Dysphagia 07/13/2016  . Anasarca 05/21/2016  . Obesity (BMI 30.0-34.9) 05/21/2016  . Closed TBI (traumatic brain injury) ([facility]) 04/21/2016  . Status post insertion of percutaneous endoscopic gastrostomy (PEG) tube ([facility]) 04/21/2016  .  Facial laceration 04/21/2016  . Cheek wound, right, subsequent encounter 04/21/2016  . Urinary tract infection due to extended-spectrum beta lactamase (ESBL) producing Escherichia coli 04/21/2016  . Hypotension 04/21/2016  . Gout   . Hypertension     CMP     Component Value Date/Time   NA 147 04/27/2018   NA 145 07/28/2017   K 3.8 04/27/2018   K 3.7 07/28/2017   CL 112 (H) 02/21/2018 0325   CO2 25 02/21/2018 0325   GLUCOSE 87 02/21/2018 0325   BUN 16 04/27/2018   CREATININE 0.7 04/27/2018   CREATININE 0.42 (L) 02/21/2018 0325   CREATININE 0.39 07/28/2017   CALCIUM 7.6 (L) 02/21/2018 0325   CALCIUM 8.1 07/28/2017   PROT 4.8 (L) 02/21/2018 0325   ALBUMIN 1.9 (L) 02/21/2018 0325   AST 30 02/21/2018 0325   ALT 33 02/21/2018 0325   ALKPHOS 324 (H) 02/21/2018 0325    BILITOT 1.1 02/21/2018 0325   GFRNONAA >60 02/21/2018 0325   GFRNONAA >90 07/28/2017   GFRAA >60 02/21/2018 0325   Recent Labs    02/16/18 2227  02/19/18 0311 02/20/18 0325 02/21/18 0325  04/11/18 04/22/18 04/27/18  NA  --    < > 145 147* 146*   < > 149* 146 147  K  --    < > 3.3* 3.7 3.0*   < > 3.3* 3.9 3.8  CL  --    < > 115* 116* 112*  --   --   --   --   CO2  --    < > 24 24 25   --   --   --   --   GLUCOSE  --    < > 96 84 87  --   --   --   --   BUN  --    < > 12 10 8    < > 22* 20 16  CREATININE  --    < > 0.54 0.54 0.42*   < > 0.5 0.6 0.7  CALCIUM  --    < > 7.7* 7.9* 7.6*  --   --   --   --   MG 2.1  --   --   --   --   --   --   --   --    < > = values in this interval not displayed.   Recent Labs    02/19/18 0311 02/20/18 0325 02/21/18 0325  AST 71* 45* 30  ALT 56* 44 33  ALKPHOS 299* 358* 324*  BILITOT 2.2* 1.9* 1.1  PROT 5.0* 5.0* 4.8*  ALBUMIN 1.9* 2.1* 1.9*   Recent Labs    02/16/18 1915 02/17/18 0435 02/18/18 0308 02/19/18 0311 02/20/18 0325 02/21/18 0325  03/30/18 04/06/18 04/27/18  WBC 18.1* 19.0* 14.6* 9.9 10.8* 7.6   < > 9.1 7.4 7.8  NEUTROABS 16.8* 16.7* 13.2*  --   --   --   --   --   --   --   HGB 12.5 10.1* 12.1 10.3* 10.4* 10.1*   < > 10.6* 10.4* 10.5*  HCT 42.8 34.9* 40.8 35.6* 35.6* 34.1*   < > 31* 32* 32*  MCV 89.5 90.6 87.9 89.7 90.4 89.0  --   --   --   --   PLT 265 193 124* 197 232 192   < > 236 214 212   < > = values in this interval not displayed.   No results for input(s): CHOL, LDLCALC,  TRIG in the last 8760 hours.  Invalid input(s): HCL No results found for: MICROALBUR Lab Results  Component Value Date   TSH 1.68 08/06/2013   No results found for: HGBA1C No results found for: CHOL, HDL, LDLCALC, LDLDIRECT, TRIG, CHOLHDL  Significant Diagnostic Results in last 30 days:  No results found.  Assessment and Plan  Dysphagia Patient is without apparent problem although her appetite is good; continue supportive care   Polyneuropathy No apparent problems; continue Neurontin 600 mg 3 times daily  Gout No reported flares; on no meds; continue supportive care     Kesley Gaffey D. [name], MD

## 2018-04-27 DIAGNOSIS — L89153 Pressure ulcer of sacral region, stage 3: Secondary | ICD-10-CM | POA: Diagnosis not present

## 2018-04-27 DIAGNOSIS — D649 Anemia, unspecified: Secondary | ICD-10-CM | POA: Diagnosis not present

## 2018-04-27 DIAGNOSIS — I10 Essential (primary) hypertension: Secondary | ICD-10-CM | POA: Diagnosis not present

## 2018-04-27 LAB — CBC AND DIFFERENTIAL
HCT: 32 — AB (ref 36–46)
Hemoglobin: 10.5 — AB (ref 12.0–16.0)
Platelets: 212 (ref 150–399)
WBC: 7.8

## 2018-04-27 LAB — BASIC METABOLIC PANEL
BUN: 16 (ref 4–21)
Creatinine: 0.7 (ref 0.5–1.1)
Glucose: 83
Potassium: 3.8 (ref 3.4–5.3)
Sodium: 147 (ref 137–147)

## 2018-05-01 ENCOUNTER — Encounter: Payer: Self-pay | Admitting: Internal Medicine

## 2018-05-01 NOTE — Assessment & Plan Note (Signed)
No apparent problems; continue Neurontin 600 mg 3 times daily

## 2018-05-01 NOTE — Assessment & Plan Note (Signed)
Patient is without apparent problem although her appetite is good; continue supportive care

## 2018-05-01 NOTE — Assessment & Plan Note (Signed)
No reported flares; on no meds; continue supportive care

## 2018-05-04 DIAGNOSIS — I10 Essential (primary) hypertension: Secondary | ICD-10-CM | POA: Diagnosis not present

## 2018-05-04 DIAGNOSIS — L89153 Pressure ulcer of sacral region, stage 3: Secondary | ICD-10-CM | POA: Diagnosis not present

## 2018-05-04 DIAGNOSIS — D649 Anemia, unspecified: Secondary | ICD-10-CM | POA: Diagnosis not present

## 2018-05-04 LAB — CBC AND DIFFERENTIAL
HCT: 30 — AB (ref 36–46)
Hemoglobin: 9.9 — AB (ref 12.0–16.0)
Platelets: 255 (ref 150–399)
WBC: 8.2

## 2018-05-04 LAB — BASIC METABOLIC PANEL
BUN: 36 — AB (ref 4–21)
Creatinine: 0.7 (ref 0.5–1.1)
Glucose: 86
POTASSIUM: 4.2 (ref 3.4–5.3)
Sodium: 147 (ref 137–147)

## 2018-05-05 ENCOUNTER — Encounter: Payer: Self-pay | Admitting: Adult Health

## 2018-05-05 ENCOUNTER — Non-Acute Institutional Stay (SKILLED_NURSING_FACILITY): Payer: Medicare Other | Admitting: Adult Health

## 2018-05-05 DIAGNOSIS — N179 Acute kidney failure, unspecified: Secondary | ICD-10-CM | POA: Diagnosis not present

## 2018-05-05 NOTE — Progress Notes (Signed)
Location:    [facility] Room Number: 206D Place of Service:  SNF (31)   CODE STATUS: full code   Allergies  Allergen Reactions  . Demerol [Meperidine] Swelling    Chief Complaint  Patient presents with  . Acute Visit    Lab follow up    HPI:  Her renal function is slightly worse with a bun of 37; create 0.66 na++ 147. She has received IVF in the recent past for these same issues. There are no reports of any changes in appetite; no uncontrolled pain; there are no reports of fevers present.   Past Medical History:  Diagnosis Date  . Anasarca 05/21/2016  . Anxiety 11/03/2016  . Closed TBI (traumatic brain injury) ([facility]) 04/21/2016  . Dysphagia 07/13/2016  . Gout   . Hypertension   . Obesity (BMI 30.0-34.9) 05/21/2016  . Status post insertion of percutaneous endoscopic gastrostomy (PEG) tube ([facility]) 04/21/2016    Past Surgical History:  Procedure Laterality Date  . CHOLECYSTECTOMY N/A 07/21/2017   Procedure: LAPAROSCOPIC CHOLECYSTECTOMY;  Surgeon: Erroll Luna, MD;  Location: [facility];  Service: General;  Laterality: N/A;  . ERCP N/A 02/18/2018   Procedure: ENDOSCOPIC RETROGRADE CHOLANGIOPANCREATOGRAPHY (ERCP);  Surgeon: Milus Banister, MD;  Location: [facility];  Service: Endoscopy;  Laterality: N/A;  . INTRAMEDULLARY (IM) NAIL INTERTROCHANTERIC Right 12/27/2016   Procedure: INTRAMEDULLARY (IM) NAIL INTERTROCHANTRIC RIGHT HIP;  Surgeon: Paralee Cancel, MD;  [facility];  Service: Orthopedics;  Laterality: Right;  . MASTECTOMY    . REMOVAL OF STONES  02/18/2018   Procedure: REMOVAL OF STONES;  Surgeon: Milus Banister, MD;  [facility];  Service: Endoscopy;;  balloon sweep  . SPHINCTEROTOMY  02/18/2018   Procedure: SPHINCTEROTOMY;  Surgeon: Milus Banister, MD;  Location: [facility];  Service: Endoscopy;;    Social History   Socioeconomic History  . Marital status: Widowed    Spouse name: Not on file  . Number of  children: 1  . Years of education: 11th grade  . Highest education level: Not on file  Occupational History  . Occupation: retired  Scientific laboratory technician  . Financial resource strain: Not hard at all  . Food insecurity:    Worry: Never true    Inability: Never true  . Transportation needs:    Medical: No    Non-medical: No  Tobacco Use  . Smoking status: Never Smoker  . Smokeless tobacco: Never Used  Substance and Sexual Activity  . Alcohol use: Not Currently  . Drug use: No  . Sexual activity: Never  Lifestyle  . Physical activity:    Days per week: 0 days    Minutes per session: 0 min  . Stress: Not at all  Relationships  . Social connections:    Talks on phone: Not on file    Gets together: Not on file    Attends religious service: Never    Active member of club or organization: No    Attends meetings of clubs or organizations: Never    Relationship status: Widowed  . Intimate partner violence:    Fear of current or ex partner: Not on file    Emotionally abused: Not on file    Physically abused: Not on file    Forced sexual activity: Not on file  Other Topics Concern  . Not on file  Social History Narrative   Admitted to [facility] 04/15/16   Widowed   Never smoked  Alcohol none   Full code   Family History  Problem Relation Age of Onset  . Asthma Mother   . Anesthesia problems Neg Hx   . Arthritis Neg Hx   . Cancer Neg Hx   . Cerebral palsy Neg Hx   . Clotting disorder Neg Hx   . Club foot Neg Hx   . Collagen disease Neg Hx   . Deep vein thrombosis Neg Hx   . Gait disorder Neg Hx   . Heart disease Neg Hx   . Gout Neg Hx   . Diabetes Neg Hx   . Hip dysplasia Neg Hx   . Hip fracture Neg Hx   . Hypermobility Neg Hx   . Hypertension Neg Hx   . Osteoporosis Neg Hx   . Other Neg Hx   . Stroke Neg Hx   . Thyroid disease Neg Hx   . Vasculitis Neg Hx   . Spina bifida Neg Hx   . Scoliosis Neg Hx   . Rheumatologic disease Neg Hx   . Pulmonary  embolism Neg Hx       VITAL SIGNS BP 120/72   Pulse 76   Temp 98.7 F (37.1 C)   Resp 18   Ht 5\' 5"  (1.651 m)   Wt 135 lb 12.8 oz (61.6 kg)   LMP  (LMP Unknown)   BMI 22.60 kg/m   Outpatient Encounter Medications as of 05/05/2018  Medication Sig  . acetaminophen (TYLENOL) 325 MG tablet Take 650 mg by mouth every 6 (six) hours as needed.  . ALPRAZolam (XANAX) 0.5 MG tablet Take 1 tablet (0.5 mg total) by mouth 2 (two) times daily.  . Amino Acids-Protein Hydrolys (FEEDING SUPPLEMENT, PRO-STAT SUGAR FREE 64,) LIQD Take 30 mLs by mouth 2 (two) times daily.  . bimatoprost (LUMIGAN) 0.01 % SOLN Place 1 drop into both eyes at bedtime. For glaucoma  . bisacodyl (DULCOLAX) 10 MG suppository Place 10 mg rectally daily as needed (constipation not relieved by MOM).  . ENSURE (ENSURE) Take 237 mLs by mouth. TID DUE TO WEIGHT LOSS ( CHOCOLATE IF AVAILABLE )  . escitalopram (LEXAPRO) 5 MG tablet Take 5 mg by mouth daily.  . furosemide (LASIX) 20 MG tablet Take 20 mg by mouth daily.  Marland Kitchen gabapentin (NEURONTIN) 600 MG tablet Take 600 mg by mouth 3 (three) times daily.  . Magnesium Hydroxide (MILK OF MAGNESIA PO) Take 30 mLs by mouth daily as needed (if no BM in 3 days).  . mirtazapine (REMERON) 7.5 MG tablet Take 7.5 mg by mouth. TAKE 1 TABLET BY MOUTH AT BEDTIME FOR APPETITE STIMULANT  . MULTIPLE VITAMINS-MINERALS PO Take by mouth. TAKE 1 TABLET BY MOUTH ONCE DAILY TO PROMOTE WOUND HEALING  . oxyCODONE-acetaminophen (PERCOCET/ROXICET) 5-325 MG tablet Take 1 tablet by mouth every 8 (eight) hours.  . potassium chloride SA (K-DUR,KLOR-CON) 20 MEQ tablet Take 20 mEq by mouth daily.   . sennosides-docusate sodium (SENOKOT-S) 8.6-50 MG tablet Take 2 tablets by mouth 2 (two) times daily as needed for constipation.  . sodium chloride 0.45 % Inject into the vein. administer 75 ml/hr via IV x 3 liters  . Sodium Phosphates (FLEET ENEMA RE) Place 1 each rectally daily as needed (constipation not relieved by  bisacodyl suppository).  . venlafaxine XR (EFFEXOR-XR) 75 MG 24 hr capsule Take 75 mg by mouth 2 (two) times daily. FOR DEPRESSION (DO NOT CRUSH)  . Vitamin D, Ergocalciferol, (DRISDOL) 1.25 MG (50000 UT) CAPS capsule Take 50,000 Units by mouth  every 7 (seven) days.   No facility-administered encounter medications on file as of 05/05/2018.      SIGNIFICANT DIAGNOSTIC EXAMS  LABS REVIEWED TODAY:   04-27-18: wbc 7.8; hgb 10.5; hct 31.8; mcv 84.8 plt 212; glucose 83' bun 16.0; creat 0.73; k+ 3.8; na++ 147 ca 8.6 05-04-18: wbc 8.3; hgb 9.9; hct 30.4; mcv 85.9; plt 255 glucose 86; bun 36.3; creat 0.66; k+ 4.2; na++ 147   Review of Systems  Unable to perform ROS: Dementia (unable to participate )   Physical Exam Constitutional:      General: She is not in acute distress.    Appearance: She is well-developed. She is not diaphoretic.     Comments: thin  Neck:     Musculoskeletal: Neck supple.     Thyroid: No thyromegaly.  Cardiovascular:     Rate and Rhythm: Normal rate and regular rhythm.     Pulses: Normal pulses.     Heart sounds: Normal heart sounds.  Pulmonary:     Effort: Pulmonary effort is normal. No respiratory distress.     Breath sounds: Normal breath sounds.  Abdominal:     General: Bowel sounds are normal. There is no distension.     Palpations: Abdomen is soft.     Tenderness: There is no abdominal tenderness.  Musculoskeletal:     Right lower leg: No edema.     Left lower leg: No edema.     Comments: Is able to move all extremities History of right hip nailing   Lymphadenopathy:     Cervical: No cervical adenopathy.  Skin:    General: Skin is warm and dry.     Comments: Stage 3 sacral ulceration: 3.6 x 2.8; x 0.8 cm with 7.1 cm undermining   Neurological:     Mental Status: She is alert. Mental status is at baseline.  Psychiatric:        Mood and Affect: Mood normal.        ASSESSMENT/ PLAN:  TODAY:   1. Acute renal failure: is without change: will  begin D5W 75 cc per hour for 2 liters then repeat bmp.   MD is aware of resident's narcotic use and is in agreement with current plan of care. We will attempt to wean resident as apropriate   Ok Edwards NP [facility]  Contact [phone] Monday through Friday 8am- 5pm  After hours call [phone]

## 2018-05-07 DIAGNOSIS — N179 Acute kidney failure, unspecified: Secondary | ICD-10-CM | POA: Insufficient documentation

## 2018-05-11 DIAGNOSIS — L89153 Pressure ulcer of sacral region, stage 3: Secondary | ICD-10-CM | POA: Diagnosis not present

## 2018-05-11 DIAGNOSIS — I10 Essential (primary) hypertension: Secondary | ICD-10-CM | POA: Diagnosis not present

## 2018-05-11 DIAGNOSIS — D649 Anemia, unspecified: Secondary | ICD-10-CM | POA: Diagnosis not present

## 2018-05-11 LAB — BASIC METABOLIC PANEL
BUN: 23 — AB (ref 4–21)
Creatinine: 0.8 (ref 0.5–1.1)
Glucose: 86
Potassium: 4.5 (ref 3.4–5.3)
Sodium: 149 — AB (ref 137–147)

## 2018-05-11 LAB — CBC AND DIFFERENTIAL
HCT: 32 — AB (ref 36–46)
Hemoglobin: 10.3 — AB (ref 12.0–16.0)
Platelets: 260 (ref 150–399)
WBC: 7.2

## 2018-05-12 ENCOUNTER — Other Ambulatory Visit: Payer: Self-pay | Admitting: Adult Health

## 2018-05-12 MED ORDER — ALPRAZOLAM 0.5 MG PO TABS: 0.5000 mg | ORAL_TABLET | Freq: Two times a day (BID) | ORAL | 0 refills | Status: DC

## 2018-05-13 ENCOUNTER — Encounter: Payer: Self-pay | Admitting: Adult Health

## 2018-05-13 ENCOUNTER — Non-Acute Institutional Stay (SKILLED_NURSING_FACILITY): Payer: Medicare Other | Admitting: Adult Health

## 2018-05-13 DIAGNOSIS — Z1383 Encounter for screening for respiratory disorder NEC: Secondary | ICD-10-CM | POA: Diagnosis not present

## 2018-05-13 DIAGNOSIS — J189 Pneumonia, unspecified organism: Secondary | ICD-10-CM

## 2018-05-13 DIAGNOSIS — R05 Cough: Secondary | ICD-10-CM | POA: Diagnosis not present

## 2018-05-13 NOTE — Progress Notes (Signed)
Location:   [facility] Room Number: 206D Place of Service:  SNF ([id number])   CODE STATUS: FULL  Allergies  Allergen Reactions  . Demerol [Meperidine] Swelling    Chief Complaint  Patient presents with  . Acute Visit    Pneumonia     HPI:  She ran a temp of 101.5 last pm. She has cough present; no shortness of breath present her po intake is poor. She has not had any further fevers.   Past Medical History:  Diagnosis Date  . Anasarca 05/21/2016  . Anxiety 11/03/2016  . Closed TBI (traumatic brain injury) ([place]) 04/21/2016  . Dysphagia 07/13/2016  . Gout   . Hypertension   . Obesity (BMI 30.0-34.9) 05/21/2016  . Status post insertion of percutaneous endoscopic gastrostomy (PEG) tube ([place]) 04/21/2016    Past Surgical History:  Procedure Laterality Date  . CHOLECYSTECTOMY N/A 07/21/2017   Procedure: LAPAROSCOPIC CHOLECYSTECTOMY;  Surgeon: Erroll Luna, MD;  Location: [facility];  Service: General;  Laterality: N/A;  . ERCP N/A 02/18/2018   Procedure: ENDOSCOPIC RETROGRADE CHOLANGIOPANCREATOGRAPHY (ERCP);  Surgeon: Milus Banister, MD;  Location: [facility];  Service: Endoscopy;  Laterality: N/A;  . INTRAMEDULLARY (IM) NAIL INTERTROCHANTERIC Right 12/27/2016   Procedure: INTRAMEDULLARY (IM) NAIL INTERTROCHANTRIC RIGHT HIP;  Surgeon: Paralee Cancel, MD;  [facility]: [facility];  Service: Orthopedics;  Laterality: Right;  . MASTECTOMY    . REMOVAL OF STONES  02/18/2018   Procedure: REMOVAL OF STONES;  Surgeon: Milus Banister, MD;  [facility]: [facility];  Service: Endoscopy;;  balloon sweep  . SPHINCTEROTOMY  02/18/2018   Procedure: SPHINCTEROTOMY;  Surgeon: Milus Banister, MD;  Location: [facility];  Service: Endoscopy;;    Social History   Socioeconomic History  . Marital status: Widowed    Spouse name: Not on file  . Number of children: 1  . Years of education: 11th grade  . Highest education level: Not on file  Occupational History  . Occupation:  retired  Scientific laboratory technician  . Financial resource strain: Not hard at all  . Food insecurity:    Worry: Never true    Inability: Never true  . Transportation needs:    Medical: No    Non-medical: No  Tobacco Use  . Smoking status: Never Smoker  . Smokeless tobacco: Never Used  Substance and Sexual Activity  . Alcohol use: Not Currently  . Drug use: No  . Sexual activity: Never  Lifestyle  . Physical activity:    Days per week: 0 days    Minutes per session: 0 min  . Stress: Not at all  Relationships  . Social connections:    Talks on phone: Not on file    Gets together: Not on file    Attends religious service: Never    Active member of club or organization: No    Attends meetings of clubs or organizations: Never    Relationship status: Widowed  . Intimate partner violence:    Fear of current or ex partner: Not on file    Emotionally abused: Not on file    Physically abused: Not on file    Forced sexual activity: Not on file  Other Topics Concern  . Not on file  Social History Narrative   Admitted to [facility] 04/15/16   Widowed   Never smoked   Alcohol none   Full code   Family History  Problem Relation Age of Onset  . Asthma Mother   .  Anesthesia problems Neg Hx   . Arthritis Neg Hx   . Cancer Neg Hx   . Cerebral palsy Neg Hx   . Clotting disorder Neg Hx   . Club foot Neg Hx   . Collagen disease Neg Hx   . Deep vein thrombosis Neg Hx   . Gait disorder Neg Hx   . Heart disease Neg Hx   . Gout Neg Hx   . Diabetes Neg Hx   . Hip dysplasia Neg Hx   . Hip fracture Neg Hx   . Hypermobility Neg Hx   . Hypertension Neg Hx   . Osteoporosis Neg Hx   . Other Neg Hx   . Stroke Neg Hx   . Thyroid disease Neg Hx   . Vasculitis Neg Hx   . Spina bifida Neg Hx   . Scoliosis Neg Hx   . Rheumatologic disease Neg Hx   . Pulmonary embolism Neg Hx       VITAL SIGNS BP 130/73   Pulse 72   Temp 98 F (36.7 C) (Oral)   Resp 18   Ht 5\' 5"  (1.651 m)    Wt 140 lb 9.6 oz (63.8 kg)   LMP  (LMP Unknown)   BMI 23.40 kg/m   Outpatient Encounter Medications as of 05/13/2018  Medication Sig  . acetaminophen (TYLENOL) 325 MG tablet Take 650 mg by mouth every 6 (six) hours as needed.   . ALPRAZolam (XANAX) 0.5 MG tablet Take 1 tablet (0.5 mg total) by mouth 2 (two) times daily.  . Amino Acids-Protein Hydrolys (FEEDING SUPPLEMENT, PRO-STAT SUGAR FREE 64,) LIQD Take 30 mLs by mouth 2 (two) times daily.  . bimatoprost (LUMIGAN) 0.01 % SOLN Place 1 drop into both eyes at bedtime. For glaucoma  . bisacodyl (DULCOLAX) 10 MG suppository Place 10 mg rectally daily as needed (constipation not relieved by MOM).  . ENSURE (ENSURE) Take 237 mLs by mouth. TID DUE TO WEIGHT LOSS ( CHOCOLATE IF AVAILABLE )  . escitalopram (LEXAPRO) 5 MG tablet Take 5 mg by mouth daily.  . furosemide (LASIX) 20 MG tablet Take 20 mg by mouth daily.  Marland Kitchen gabapentin (NEURONTIN) 600 MG tablet Take 600 mg by mouth 3 (three) times daily.  . Magnesium Hydroxide (MILK OF MAGNESIA PO) Take 30 mLs by mouth daily as needed (if no BM in 3 days).  . mirtazapine (REMERON) 7.5 MG tablet Take 7.5 mg by mouth. TAKE 1 TABLET BY MOUTH AT BEDTIME FOR APPETITE STIMULANT  . MULTIPLE VITAMINS-MINERALS PO Take by mouth. TAKE 1 TABLET BY MOUTH ONCE DAILY TO PROMOTE WOUND HEALING  . nutrition supplement, JUVEN, (JUVEN) PACK Take 1 packet by mouth 2 (two) times daily between meals. mix with 4-8oz water to aid in wound healing.  Marland Kitchen oxyCODONE-acetaminophen (PERCOCET/ROXICET) 5-325 MG tablet Take 1 tablet by mouth 2 (two) times daily. Hold for somnolence  . potassium chloride SA (K-DUR,KLOR-CON) 20 MEQ tablet Take 20 mEq by mouth daily.   . sennosides-docusate sodium (SENOKOT-S) 8.6-50 MG tablet Take 2 tablets by mouth 2 (two) times daily as needed for constipation.  . Sodium Phosphates (FLEET ENEMA RE) Place 1 each rectally daily as needed (constipation not relieved by bisacodyl suppository).  . venlafaxine XR  (EFFEXOR-XR) 75 MG 24 hr capsule Take 75 mg by mouth 2 (two) times daily. FOR DEPRESSION (DO NOT CRUSH)  . Vitamin D, Ergocalciferol, (DRISDOL) 1.25 MG (50000 UT) CAPS capsule Take 50,000 Units by mouth every 7 (seven) days.  . [DISCONTINUED] oxyCODONE-acetaminophen (PERCOCET/ROXICET)  5-325 MG tablet Take 1 tablet by mouth every 8 (eight) hours.  . [DISCONTINUED] sodium chloride 0.45 % Inject into the vein. administer 75 ml/hr via IV x 3 liters   No facility-administered encounter medications on file as of 05/13/2018.      SIGNIFICANT DIAGNOSTIC EXAMS  LABS REVIEWED PREVIOUS:   04-27-18: wbc 7.8; hgb 10.5; hct 31.8; mcv 84.8 plt 212; glucose 83' bun 16.0; creat 0.73; k+ 3.8; na++ 147 ca 8.6 05-04-18: wbc 8.3; hgb 9.9; hct 30.4; mcv 85.9; plt 255 glucose 86; bun 36.3; creat 0.66; k+ 4.2; na++ 147   TODAY:   05-11-18: wbc 7.2; hgb 10.3; hct 32; plt 260; glucose 86; bun 23; creat 0.8; k+ 4.5; na++ 149    Review of Systems  Unable to perform ROS: Dementia (unable to participate )    Physical Exam Constitutional:      General: She is not in acute distress.    Appearance: She is well-developed. She is not diaphoretic.  Neck:     Musculoskeletal: Neck supple.     Thyroid: No thyromegaly.  Cardiovascular:     Rate and Rhythm: Normal rate and regular rhythm.     Pulses: Normal pulses.     Heart sounds: Normal heart sounds.  Pulmonary:     Effort: No respiratory distress.     Breath sounds: Wheezing and rhonchi present.     Comments: Increased effort 02 dependent  Abdominal:     General: Bowel sounds are normal. There is no distension.     Palpations: Abdomen is soft.     Tenderness: There is no abdominal tenderness.  Musculoskeletal:     Right lower leg: No edema.     Left lower leg: No edema.     Comments: Is able to move all extremities   Lymphadenopathy:     Cervical: No cervical adenopathy.  Skin:    General: Skin is warm and dry.     Comments: Stage 3 sacral ulceration: 3.6  x 2.8; x 0.8 cm with 7.1 cm undermining    Neurological:     Mental Status: She is alert. Mental status is at baseline.  Psychiatric:        Mood and Affect: Mood normal.     ASSESSMENT/ PLAN:  1. HCAP: is worse: will begin augmentin 875 mg twice daily for 10 days with probiotic twice daily for 2 weeks and mucinex 600 mg twice daily     MD is aware of resident's narcotic use and is in agreement with current plan of care. We will attempt to wean resident as apropriate   Ok Edwards NP [facility]  Contact [phone] Monday through Friday 8am- 5pm  After hours call [phone]

## 2018-05-15 DIAGNOSIS — D649 Anemia, unspecified: Secondary | ICD-10-CM | POA: Diagnosis not present

## 2018-05-15 DIAGNOSIS — R0989 Other specified symptoms and signs involving the circulatory and respiratory systems: Secondary | ICD-10-CM | POA: Diagnosis not present

## 2018-05-15 DIAGNOSIS — R319 Hematuria, unspecified: Secondary | ICD-10-CM | POA: Diagnosis not present

## 2018-05-15 DIAGNOSIS — N390 Urinary tract infection, site not specified: Secondary | ICD-10-CM | POA: Diagnosis not present

## 2018-05-16 DIAGNOSIS — L89150 Pressure ulcer of sacral region, unstageable: Secondary | ICD-10-CM | POA: Diagnosis not present

## 2018-05-18 ENCOUNTER — Other Ambulatory Visit: Payer: Self-pay | Admitting: Adult Health

## 2018-05-18 DIAGNOSIS — D649 Anemia, unspecified: Secondary | ICD-10-CM | POA: Diagnosis not present

## 2018-05-18 DIAGNOSIS — L89153 Pressure ulcer of sacral region, stage 3: Secondary | ICD-10-CM | POA: Diagnosis not present

## 2018-05-18 DIAGNOSIS — J189 Pneumonia, unspecified organism: Secondary | ICD-10-CM | POA: Insufficient documentation

## 2018-05-18 DIAGNOSIS — I10 Essential (primary) hypertension: Secondary | ICD-10-CM | POA: Diagnosis not present

## 2018-05-18 MED ORDER — OXYCODONE-ACETAMINOPHEN 5-325 MG PO TABS: 1.0000 | ORAL_TABLET | Freq: Two times a day (BID) | ORAL | 0 refills | Status: DC

## 2018-05-25 ENCOUNTER — Encounter: Payer: Self-pay | Admitting: Internal Medicine

## 2018-05-25 ENCOUNTER — Non-Acute Institutional Stay (SKILLED_NURSING_FACILITY): Payer: Medicare Other | Admitting: Internal Medicine

## 2018-05-25 DIAGNOSIS — F339 Major depressive disorder, recurrent, unspecified: Secondary | ICD-10-CM

## 2018-05-25 DIAGNOSIS — F419 Anxiety disorder, unspecified: Secondary | ICD-10-CM

## 2018-05-25 DIAGNOSIS — E870 Hyperosmolality and hypernatremia: Secondary | ICD-10-CM

## 2018-05-25 DIAGNOSIS — L89153 Pressure ulcer of sacral region, stage 3: Secondary | ICD-10-CM | POA: Diagnosis not present

## 2018-05-25 LAB — BASIC METABOLIC PANEL
BUN: 24 — AB (ref 4–21)
Creatinine: 0.7 (ref 0.5–1.1)
Glucose: 83
Potassium: 4.1 (ref 3.4–5.3)
Sodium: 147 (ref 137–147)

## 2018-05-25 LAB — CBC AND DIFFERENTIAL
HCT: 32 — AB (ref 36–46)
Hemoglobin: 10.5 — AB (ref 12.0–16.0)
Platelets: 226 (ref 150–399)
WBC: 9.4

## 2018-05-25 NOTE — Progress Notes (Signed)
Location:  [facility] Room Number: 206D Place of Service:  SNF [phone])  [name], MD  [name], [name]  Extended Emergency Contact Information Primary Emergency Contact: [name] Address: [address]          [place], [place] [address] [name] of [place] Phone: [phone] Relation: Son Secondary Emergency Contact: [name],[name]          [place], [place] Relation: Sister    Allergies: Demerol [meperidine]  Chief Complaint  Patient presents with  . Medical Management of Chronic Issues    Routine visit    HPI: Patient is 83 y.o. female who is being seen for routine issues of depression, anxiety, and hyponatremia.  Past Medical History:  Diagnosis Date  . Anasarca 05/21/2016  . Anxiety 11/03/2016  . Closed TBI (traumatic brain injury) ([place]) 04/21/2016  . Dysphagia 07/13/2016  . Gout   . Hypertension   . Obesity (BMI 30.0-34.9) 05/21/2016  . Status post insertion of percutaneous endoscopic gastrostomy (PEG) tube ([place]) 04/21/2016    Past Surgical History:  Procedure Laterality Date  . CHOLECYSTECTOMY N/A 07/21/2017   Procedure: LAPAROSCOPIC CHOLECYSTECTOMY;  Surgeon: Erroll Luna, MD;  Location: [facility];  Service: General;  Laterality: N/A;  . ERCP N/A 02/18/2018   Procedure: ENDOSCOPIC RETROGRADE CHOLANGIOPANCREATOGRAPHY (ERCP);  Surgeon: Milus Banister, MD;  Location: [facility];  Service: Endoscopy;  Laterality: N/A;  . INTRAMEDULLARY (IM) NAIL INTERTROCHANTERIC Right 12/27/2016   Procedure: INTRAMEDULLARY (IM) NAIL INTERTROCHANTRIC RIGHT HIP;  Surgeon: Paralee Cancel, MD;  [facility];  Service: Orthopedics;  Laterality: Right;  . MASTECTOMY    . REMOVAL OF STONES  02/18/2018   Procedure: REMOVAL OF STONES;  Surgeon: Milus Banister, MD;  [facility];  Service: Endoscopy;;  balloon sweep  . SPHINCTEROTOMY   02/18/2018   Procedure: SPHINCTEROTOMY;  Surgeon: Milus Banister, MD;  Location: [facility];  Service: Endoscopy;;    Allergies as of 05/25/2018      Reactions   Demerol [meperidine] Swelling      Medication List       Accurate as of May 25, 2018 11:59 PM. Always use your most recent med list.        acetaminophen 325 MG tablet Commonly known as:  TYLENOL Take 650 mg by mouth every 6 (six) hours as needed.   ALPRAZolam 0.5 MG tablet Commonly known as:  XANAX Take 1 tablet (0.5 mg total) by mouth 2 (two) times daily.   bimatoprost 0.01 % Soln Commonly known as:  LUMIGAN Place 1 drop into both eyes at bedtime. For glaucoma   bisacodyl 10 MG suppository Commonly known as:  DULCOLAX Place 10 mg rectally daily as needed (constipation not relieved by MOM).   Ensure Take 237 mLs by mouth. TID DUE TO WEIGHT LOSS ( CHOCOLATE IF AVAILABLE )   nutrition supplement (JUVEN) Pack Take 1 packet by mouth 2 (two) times daily between meals. mix with 4-8oz water to aid in wound healing.   escitalopram 5 MG tablet Commonly known as:  LEXAPRO Take 5 mg by mouth daily.   feeding supplement (PRO-STAT SUGAR FREE 64) Liqd Take 30 mLs by mouth 2 (two) times daily.   FLEET ENEMA RE Place 1 each rectally daily as needed (constipation not relieved by bisacodyl suppository).   furosemide 20 MG tablet Commonly known as:  LASIX Take 20 mg by mouth  daily.   gabapentin 600 MG tablet Commonly known as:  NEURONTIN Take 600 mg by mouth 3 (three) times daily.   heparin lock flush 100 UNIT/ML Soln injection Inject 500 Units into the vein. 100 UNIT/ML- FLUSH MIDLINE 3 CC AFTER ABT AND NS   MILK OF MAGNESIA PO Take 30 mLs by mouth daily as needed (if no BM in 3 days).   mirtazapine 7.5 MG tablet Commonly known as:  REMERON Take 7.5 mg by mouth. TAKE 1 TABLET BY MOUTH AT BEDTIME FOR APPETITE STIMULANT   MULTIPLE VITAMINS-MINERALS PO Take by mouth. TAKE 1 TABLET BY MOUTH ONCE DAILY TO  PROMOTE WOUND HEALING   oxyCODONE-acetaminophen 5-325 MG tablet Commonly known as:  PERCOCET/ROXICET Take 1 tablet by mouth 2 (two) times daily. Hold for somnolence   potassium chloride SA 20 MEQ tablet Commonly known as:  K-DUR Take 20 mEq by mouth daily.   saccharomyces boulardii 250 MG capsule Commonly known as:  FLORASTOR Take 250 mg by mouth 2 (two) times daily.   sennosides-docusate sodium 8.6-50 MG tablet Commonly known as:  SENOKOT-S Take 2 tablets by mouth 2 (two) times daily as needed for constipation.   sodium chloride 0.45 % Inject into the vein. ADMINISTER IVF 75 ML/HR X 4 LITERS FOR HYPERNATREMIA and 0.9% SYRINGE- FLUSH MIDLINE 10 CC BEFORE ABT TX   venlafaxine XR 75 MG 24 hr capsule Commonly known as:  EFFEXOR-XR Take 75 mg by mouth 2 (two) times daily. FOR DEPRESSION (DO NOT CRUSH)   Vitamin D (Ergocalciferol) 1.25 MG (50000 UT) Caps capsule Commonly known as:  DRISDOL Take 50,000 Units by mouth every 7 (seven) days.       No orders of the defined types were placed in this encounter.   Immunization History  Administered Date(s) Administered  . Influenza-Unspecified 12/19/2016  . PPD Test 04/29/2016  . Pneumococcal Conjugate-13 08/06/2016  . Pneumococcal Polysaccharide-23 04/15/2016  . Tdap 03/31/2016    Social History   Tobacco Use  . Smoking status: Never Smoker  . Smokeless tobacco: Never Used  Substance Use Topics  . Alcohol use: Not Currently    Review of Systems  DATA OBTAINED: from patient-very limited; nursing-no acute concerns except for sodium on lab GENERAL:  no fevers, fatigue, appetite changes SKIN: No itching, rash HEENT: No complaint RESPIRATORY: No cough, wheezing, SOB CARDIAC: No chest pain, palpitations, lower extremity edema  GI: No abdominal pain, No N/V/D or constipation, No heartburn or reflux  GU: No dysuria, frequency or urgency, or incontinence  MUSCULOSKELETAL: No unrelieved bone/joint pain NEUROLOGIC: No  headache, dizziness  PSYCHIATRIC: No overt anxiety or sadness  Vitals:   05/25/18 1356  BP: 112/68  Pulse: 69  Resp: 18  Temp: 98 F (36.7 C)   Body mass index is 23.8 kg/m. Physical Exam  GENERAL APPEARANCE: Alert, conversant, No acute distress  SKIN: No diaphoresis rash HEENT: Unremarkable RESPIRATORY: Breathing is even, unlabored. Lung sounds are clear   CARDIOVASCULAR: Heart RRR no murmurs, rubs or gallops. No peripheral edema  GASTROINTESTINAL: Abdomen is soft, non-tender, not distended w/ normal bowel sounds.  GENITOURINARY: Bladder non tender, not distended  MUSCULOSKELETAL: No abnormal joints or musculature NEUROLOGIC: Cranial nerves 2-12 grossly intact. Moves all extremities PSYCHIATRIC: Mood and affect status post TBI with some dementia as well, no behavioral issues  Patient Active Problem List   Diagnosis Date Noted  . Hypernatremia 05/26/2018  . HCAP (healthcare-associated pneumonia) 05/18/2018  . Acute renal failure (ARF) ([place]) 05/07/2018  . MRSA (methicillin resistant Staphylococcus  aureus) septicemia ([place]) 02/23/2018  . Hypokalemia 02/23/2018  . Bile duct stone   . E. coli sepsis ([place]) 02/17/2018  . Cholecystitis 07/31/2017  . Cholecystitis, acute with cholelithiasis 07/20/2017  . Pressure injury of skin 07/20/2017  . History of total right hip arthroplasty 01/05/2017  . Acute blood loss as cause of postoperative anemia 01/05/2017  . Escherichia coli urinary tract infection 01/05/2017  . Acute encephalopathy 01/05/2017  . Depression, recurrent ([place]) 01/05/2017  . Polyneuropathy 01/05/2017  . Closed right hip fracture ([place]) 12/25/2016  . Anxiety 11/03/2016  . Dysphagia 07/13/2016  . Anasarca 05/21/2016  . Obesity (BMI 30.0-34.9) 05/21/2016  . Closed TBI (traumatic brain injury) ([place]) 04/21/2016  . Status post insertion of percutaneous endoscopic gastrostomy (PEG) tube ([place]) 04/21/2016  . Facial laceration 04/21/2016  . Cheek wound, right, subsequent  encounter 04/21/2016  . Urinary tract infection due to extended-spectrum beta lactamase (ESBL) producing Escherichia coli 04/21/2016  . Hypotension 04/21/2016  . Gout   . Hypertension     CMP     Component Value Date/Time   NA 149 (A) 05/11/2018   NA 145 07/28/2017   K 4.5 05/11/2018   K 3.7 07/28/2017   CL 112 (H) 02/21/2018 0325   CO2 25 02/21/2018 0325   GLUCOSE 87 02/21/2018 0325   BUN 23 (A) 05/11/2018   CREATININE 0.8 05/11/2018   CREATININE 0.42 (L) 02/21/2018 0325   CREATININE 0.39 07/28/2017   CALCIUM 7.6 (L) 02/21/2018 0325   CALCIUM 8.1 07/28/2017   PROT 4.8 (L) 02/21/2018 0325   ALBUMIN 1.9 (L) 02/21/2018 0325   AST 30 02/21/2018 0325   ALT 33 02/21/2018 0325   ALKPHOS 324 (H) 02/21/2018 0325   BILITOT 1.1 02/21/2018 0325   GFRNONAA >60 02/21/2018 0325   GFRNONAA >90 07/28/2017   GFRAA >60 02/21/2018 0325   Recent Labs    02/16/18 2227  02/19/18 0311 02/20/18 0325 02/21/18 0325  04/27/18 05/04/18 05/11/18  NA  --    < > 145 147* 146*   < > 147 147 149*  K  --    < > 3.3* 3.7 3.0*   < > 3.8 4.2 4.5  CL  --    < > 115* 116* 112*  --   --   --   --   CO2  --    < > 24 24 25   --   --   --   --   GLUCOSE  --    < > 96 84 87  --   --   --   --   BUN  --    < > 12 10 8    < > 16 36* 23*  CREATININE  --    < > 0.54 0.54 0.42*   < > 0.7 0.7 0.8  CALCIUM  --    < > 7.7* 7.9* 7.6*  --   --   --   --   MG 2.1  --   --   --   --   --   --   --   --    < > = values in this interval not displayed.   Recent Labs    02/19/18 0311 02/20/18 0325 02/21/18 0325  AST 71* 45* 30  ALT 56* 44 33  ALKPHOS 299* 358* 324*  BILITOT 2.2* 1.9* 1.1  PROT 5.0* 5.0* 4.8*  ALBUMIN 1.9* 2.1* 1.9*   Recent Labs    02/16/18 1915 02/17/18 0435 02/18/18 0308 02/19/18  0051 02/20/18 0325 02/21/18 0325  04/27/18 05/04/18 05/11/18  WBC 18.1* 19.0* 14.6* 9.9 10.8* 7.6   < > 7.8 8.2 7.2  NEUTROABS 16.8* 16.7* 13.2*  --   --   --   --   --   --   --   HGB 12.5 10.1* 12.1 10.3*  10.4* 10.1*   < > 10.5* 9.9* 10.3*  HCT 42.8 34.9* 40.8 35.6* 35.6* 34.1*   < > 32* 30* 32*  MCV 89.5 90.6 87.9 89.7 90.4 89.0  --   --   --   --   PLT 265 193 124* 197 232 192   < > 212 255 260   < > = values in this interval not displayed.   No results for input(s): CHOL, LDLCALC, TRIG in the last 8760 hours.  Invalid input(s): HCL No results found for: MICROALBUR Lab Results  Component Value Date   TSH 1.68 08/06/2013   No results found for: HGBA1C No results found for: CHOL, HDL, LDLCALC, LDLDIRECT, TRIG, CHOLHDL  Significant Diagnostic Results in last 30 days:  No results found.  Assessment and Plan  Depression, recurrent ([place]) Continues controlled; continue Lexapro 5 mg daily and Effexor 75 mg twice daily  Anxiety Appears controlled; continue Xanax 0.5 mg twice daily  Hypernatremia Recurrent twice.  Patient drinks well, patient is Lasix which can cause hyponatremia but I have never seen it in someone who has been on it for a long while.  In any case Lasix has been DC'd and patient is currently getting a 24-hour urine for volume and osmolality for which she will be started on hydrochlorothiazide 25 mg daily      Abrina Petz D. [name], MD

## 2018-05-26 ENCOUNTER — Encounter: Payer: Self-pay | Admitting: Internal Medicine

## 2018-05-26 DIAGNOSIS — E870 Hyperosmolality and hypernatremia: Secondary | ICD-10-CM | POA: Insufficient documentation

## 2018-05-26 NOTE — Assessment & Plan Note (Signed)
Continues controlled; continue Lexapro 5 mg daily and Effexor 75 mg twice daily

## 2018-05-26 NOTE — Assessment & Plan Note (Signed)
Recurrent twice.  Patient drinks well, patient is Lasix which can cause hyponatremia but I have never seen it in someone who has been on it for a long while.  In any case Lasix has been DC'd and patient is currently getting a 24-hour urine for volume and osmolality for which she will be started on hydrochlorothiazide 25 mg daily

## 2018-05-26 NOTE — Assessment & Plan Note (Signed)
Appears controlled; continue Xanax 0.5 mg twice daily

## 2018-05-27 ENCOUNTER — Other Ambulatory Visit: Payer: Self-pay | Admitting: Internal Medicine

## 2018-05-27 MED ORDER — ALPRAZOLAM 0.5 MG PO TABS: 0.5000 mg | ORAL_TABLET | Freq: Two times a day (BID) | ORAL | 0 refills | Status: DC

## 2018-05-28 LAB — BASIC METABOLIC PANEL
BUN: 20 (ref 4–21)
Creatinine: 0.6 (ref 0.5–1.1)
Glucose: 87
Potassium: 3.9 (ref 3.4–5.3)
Sodium: 142 (ref 137–147)

## 2018-05-29 ENCOUNTER — Non-Acute Institutional Stay (SKILLED_NURSING_FACILITY): Payer: Medicare Other | Admitting: Internal Medicine

## 2018-05-29 DIAGNOSIS — E870 Hyperosmolality and hypernatremia: Secondary | ICD-10-CM | POA: Diagnosis not present

## 2018-05-29 LAB — BASIC METABOLIC PANEL
BUN: 20 (ref 4–21)
Creatinine: 0.7 (ref 0.5–1.1)
Glucose: 84
Potassium: 3.8 (ref 3.4–5.3)
Sodium: 143 (ref 137–147)

## 2018-05-31 ENCOUNTER — Encounter: Payer: Self-pay | Admitting: Internal Medicine

## 2018-05-31 NOTE — Progress Notes (Signed)
Location:  [facility] [place] [facility] Service:  SNF (31)  Autumn Duos, MD  Patient Care Team: Autumn Duos, [name]  Extended Emergency Contact Information Primary Emergency Contact: Autumn [facility] Address: [address]          [place], [place] [address] [facility] of [facility] Phone: [phone] Relation: Son Secondary Emergency Contact: Autumn [facility]          [place], [place] Relation: Sister    Allergies: Demerol [meperidine]  Chief Complaint  Patient presents with  . Acute Visit    HPI: Patient is 83 y.o. female who is being seen in follow-up for recurrent hyponatremia.  Today her sodium is 143 and serum osmolality is 286.  Initial testing for cause of hyponatremia is returned and not very useful.  Patient is awake alert and at her baseline self as she has been while she was hyponatremic did  Past Medical History:  Diagnosis Date  . Anasarca 05/21/2016  . Anxiety 11/03/2016  . Closed TBI (traumatic brain injury) ([place]) 04/21/2016  . Dysphagia 07/13/2016  . Gout   . Hypertension   . Obesity (BMI 30.0-34.9) 05/21/2016  . Status post insertion of percutaneous endoscopic gastrostomy (PEG) tube ([place]) 04/21/2016    Past Surgical History:  Procedure Laterality Date  . CHOLECYSTECTOMY N/A 07/21/2017   Procedure: LAPAROSCOPIC CHOLECYSTECTOMY;  Surgeon: Erroll Luna, MD;  Location: [facility];  Service: General;  Laterality: N/A;  . ERCP N/A 02/18/2018   Procedure: ENDOSCOPIC RETROGRADE CHOLANGIOPANCREATOGRAPHY (ERCP);  Surgeon: Milus Banister, MD;  Location: [facility];  Service: Endoscopy;  Laterality: N/A;  . INTRAMEDULLARY (IM) NAIL INTERTROCHANTERIC Right 12/27/2016   Procedure: INTRAMEDULLARY (IM) NAIL INTERTROCHANTRIC RIGHT HIP;  Surgeon: Paralee Cancel, MD;  [facility]: [facility];  Service: Orthopedics;  Laterality: Right;  . MASTECTOMY    . REMOVAL OF STONES  02/18/2018   Procedure:  REMOVAL OF STONES;  Surgeon: Milus Banister, MD;  [facility];  Service: Endoscopy;;  balloon sweep  . SPHINCTEROTOMY  02/18/2018   Procedure: SPHINCTEROTOMY;  Surgeon: Milus Banister, MD;  Location: [facility];  Service: Endoscopy;;    Allergies as of 05/29/2018      Reactions   Demerol [meperidine] Swelling      Medication List       Accurate as of May 29, 2018 11:59 PM. Always use your most recent med list.        acetaminophen 325 MG tablet Commonly known as:  TYLENOL Take 650 mg by mouth every 6 (six) hours as needed.   ALPRAZolam 0.5 MG tablet Commonly known as:  XANAX Take 1 tablet (0.5 mg total) by mouth 2 (two) times daily.   bimatoprost 0.01 % Soln Commonly known as:  LUMIGAN Place 1 drop into both eyes at bedtime. For glaucoma   bisacodyl 10 MG suppository Commonly known as:  DULCOLAX Place 10 mg rectally daily as needed (constipation not relieved by MOM).   Ensure Take 237 mLs by mouth. TID DUE TO WEIGHT LOSS ( CHOCOLATE IF AVAILABLE )   nutrition supplement (JUVEN) Pack Take 1 packet by mouth 2 (two) times daily between meals. mix with 4-8oz water to aid in wound healing.   escitalopram 5 MG tablet Commonly known as:  LEXAPRO Take 5 mg by mouth daily.   feeding supplement (PRO-STAT SUGAR FREE 64) Liqd Take 30 mLs by mouth 2 (two) times daily.   FLEET ENEMA RE Place  1 each rectally daily as needed (constipation not relieved by bisacodyl suppository).   furosemide 20 MG tablet Commonly known as:  LASIX Take 20 mg by mouth daily.   gabapentin 600 MG tablet Commonly known as:  NEURONTIN Take 600 mg by mouth 3 (three) times daily.   heparin lock flush 100 UNIT/ML Soln injection Inject 500 Units into the vein. 100 UNIT/ML- FLUSH MIDLINE 3 CC AFTER ABT AND NS   MILK OF MAGNESIA PO Take 30 mLs by mouth daily as needed (if no BM in 3 days).   mirtazapine 7.5 MG tablet Commonly known as:  REMERON Take 7.5 mg by mouth. TAKE 1 TABLET  BY MOUTH AT BEDTIME FOR APPETITE STIMULANT   MULTIPLE VITAMINS-MINERALS PO Take by mouth. TAKE 1 TABLET BY MOUTH ONCE DAILY TO PROMOTE WOUND HEALING   oxyCODONE-acetaminophen 5-325 MG tablet Commonly known as:  PERCOCET/ROXICET Take 1 tablet by mouth 2 (two) times daily. Hold for somnolence   potassium chloride SA 20 MEQ tablet Commonly known as:  K-DUR Take 20 mEq by mouth daily.   sennosides-docusate sodium 8.6-50 MG tablet Commonly known as:  SENOKOT-S Take 2 tablets by mouth 2 (two) times daily as needed for constipation.   sodium chloride 0.45 % Inject into the vein. ADMINISTER IVF 75 ML/HR X 4 LITERS FOR HYPERNATREMIA and 0.9% SYRINGE- FLUSH MIDLINE 10 CC BEFORE ABT TX   venlafaxine XR 75 MG 24 hr capsule Commonly known as:  EFFEXOR-XR Take 75 mg by mouth 2 (two) times daily. FOR DEPRESSION (DO NOT CRUSH)   Vitamin D (Ergocalciferol) 1.25 MG (50000 UT) Caps capsule Commonly known as:  DRISDOL Take 50,000 Units by mouth every 7 (seven) days.       No orders of the defined types were placed in this encounter.   Immunization History  Administered Date(s) Administered  . Influenza-Unspecified 12/19/2016  . PPD Test 04/29/2016  . Pneumococcal Conjugate-13 08/06/2016  . Pneumococcal Polysaccharide-23 04/15/2016  . Tdap 03/31/2016    Social History   Tobacco Use  . Smoking status: Never Smoker  . Smokeless tobacco: Never Used  Substance Use Topics  . Alcohol use: Not Currently    Review of Systems  DATA OBTAINED: from patient-limited; nurse-no acute concerns GENERAL:  no fevers, fatigue, appetite changes SKIN: No itching, rash HEENT: No complaint RESPIRATORY: No cough, wheezing, SOB CARDIAC: No chest pain, palpitations, lower extremity edema  GI: No abdominal pain, No N/V/D or constipation, No heartburn or reflux  GU: No dysuria, frequency or urgency, or incontinence  MUSCULOSKELETAL: No unrelieved bone/joint pain NEUROLOGIC: No headache, dizziness   PSYCHIATRIC: No overt anxiety or sadness  Vitals:   05/31/18 2246  BP: 110/73  Pulse: 74  Resp: 18  Temp: 98 F (36.7 C)   Body mass index is 23.8 kg/m. Physical Exam  GENERAL APPEARANCE: Alert, moderately conversant, No acute distress  SKIN: No diaphoresis rash HEENT: Unremarkable RESPIRATORY: Breathing is even, unlabored. Lung sounds are clear   CARDIOVASCULAR: Heart RRR no murmurs, rubs or gallops. No peripheral edema  GASTROINTESTINAL: Abdomen is soft, non-tender, not distended w/ normal bowel sounds.  GENITOURINARY: Bladder non tender, not distended  MUSCULOSKELETAL: No abnormal joints or musculature NEUROLOGIC: Cranial nerves 2-12 grossly intact with rightward gaze. Moves all extremities PSYCHIATRIC: Mood and affect with dementia, no behavioral issues  Patient Active Problem List   Diagnosis Date Noted  . Hypernatremia 05/26/2018  . HCAP (healthcare-associated pneumonia) 05/18/2018  . Acute renal failure (ARF) ([place]) 05/07/2018  . MRSA (methicillin resistant Staphylococcus aureus)  septicemia ([place]) 02/23/2018  . Hypokalemia 02/23/2018  . Bile duct stone   . E. coli sepsis ([place]) 02/17/2018  . Cholecystitis 07/31/2017  . Cholecystitis, acute with cholelithiasis 07/20/2017  . Pressure injury of skin 07/20/2017  . History of total right hip arthroplasty 01/05/2017  . Acute blood loss as cause of postoperative anemia 01/05/2017  . Escherichia coli urinary tract infection 01/05/2017  . Acute encephalopathy 01/05/2017  . Depression, recurrent ([place]) 01/05/2017  . Polyneuropathy 01/05/2017  . Closed right hip fracture ([place]) 12/25/2016  . Anxiety 11/03/2016  . Dysphagia 07/13/2016  . Anasarca 05/21/2016  . Obesity (BMI 30.0-34.9) 05/21/2016  . Closed TBI (traumatic brain injury) ([place]) 04/21/2016  . Status post insertion of percutaneous endoscopic gastrostomy (PEG) tube ([place]) 04/21/2016  . Facial laceration 04/21/2016  . Cheek wound, right, subsequent encounter 04/21/2016   . Urinary tract infection due to extended-spectrum beta lactamase (ESBL) producing Escherichia coli 04/21/2016  . Hypotension 04/21/2016  . Gout   . Hypertension     CMP     Component Value Date/Time   NA 143 05/29/2018   NA 145 07/28/2017   K 3.8 05/29/2018   K 3.7 07/28/2017   CL 112 (H) 02/21/2018 0325   CO2 25 02/21/2018 0325   GLUCOSE 87 02/21/2018 0325   BUN 20 05/29/2018   CREATININE 0.7 05/29/2018   CREATININE 0.42 (L) 02/21/2018 0325   CREATININE 0.39 07/28/2017   CALCIUM 7.6 (L) 02/21/2018 0325   CALCIUM 8.1 07/28/2017   PROT 4.8 (L) 02/21/2018 0325   ALBUMIN 1.9 (L) 02/21/2018 0325   AST 30 02/21/2018 0325   ALT 33 02/21/2018 0325   ALKPHOS 324 (H) 02/21/2018 0325   BILITOT 1.1 02/21/2018 0325   GFRNONAA >60 02/21/2018 0325   GFRNONAA >90 07/28/2017   GFRAA >60 02/21/2018 0325   Recent Labs    02/16/18 2227  02/19/18 0311 02/20/18 0325 02/21/18 0325  05/25/18 05/28/18 05/29/18  NA  --    < > 145 147* 146*   < > 147 142 143  K  --    < > 3.3* 3.7 3.0*   < > 4.1 3.9 3.8  CL  --    < > 115* 116* 112*  --   --   --   --   CO2  --    < > 24 24 25   --   --   --   --   GLUCOSE  --    < > 96 84 87  --   --   --   --   BUN  --    < > 12 10 8    < > 24* 20 20  CREATININE  --    < > 0.54 0.54 0.42*   < > 0.7 0.6 0.7  CALCIUM  --    < > 7.7* 7.9* 7.6*  --   --   --   --   MG 2.1  --   --   --   --   --   --   --   --    < > = values in this interval not displayed.   Recent Labs    02/19/18 0311 02/20/18 0325 02/21/18 0325  AST 71* 45* 30  ALT 56* 44 33  ALKPHOS 299* 358* 324*  BILITOT 2.2* 1.9* 1.1  PROT 5.0* 5.0* 4.8*  ALBUMIN 1.9* 2.1* 1.9*   Recent Labs    02/16/18 1915 02/17/18 0435 02/18/18 0308 02/19/18 5035 02/20/18 4656  02/21/18 0325  05/04/18 05/11/18 05/25/18  WBC 18.1* 19.0* 14.6* 9.9 10.8* 7.6   < > 8.2 7.2 9.4  NEUTROABS 16.8* 16.7* 13.2*  --   --   --   --   --   --   --   HGB 12.5 10.1* 12.1 10.3* 10.4* 10.1*   < > 9.9* 10.3* 10.5*   HCT 42.8 34.9* 40.8 35.6* 35.6* 34.1*   < > 30* 32* 32*  MCV 89.5 90.6 87.9 89.7 90.4 89.0  --   --   --   --   PLT 265 193 124* 197 232 192   < > 255 260 226   < > = values in this interval not displayed.   No results for input(s): CHOL, LDLCALC, TRIG in the last 8760 hours.  Invalid input(s): HCL No results found for: MICROALBUR Lab Results  Component Value Date   TSH 1.68 08/06/2013   No results found for: HGBA1C No results found for: CHOL, HDL, LDLCALC, LDLDIRECT, TRIG, CHOLHDL  Significant Diagnostic Results in last 30 days:  No results found.  Assessment and Plan  Recurrent hyper natremia- resolved; results of 24-hour urine was volume 1250 cc and urine osmolality was 441 which rules out nephrogenic diabetes insipidus.  It may be that patient really does not drink enough fluids; will continue to monitor      Inocencio Homes, MD

## 2018-06-01 DIAGNOSIS — L89153 Pressure ulcer of sacral region, stage 3: Secondary | ICD-10-CM | POA: Diagnosis not present

## 2018-06-08 DIAGNOSIS — D649 Anemia, unspecified: Secondary | ICD-10-CM | POA: Diagnosis not present

## 2018-06-08 DIAGNOSIS — I10 Essential (primary) hypertension: Secondary | ICD-10-CM | POA: Diagnosis not present

## 2018-06-08 DIAGNOSIS — L89153 Pressure ulcer of sacral region, stage 3: Secondary | ICD-10-CM | POA: Diagnosis not present

## 2018-06-08 LAB — BASIC METABOLIC PANEL
BUN: 25 — AB (ref 4–21)
Creatinine: 0.6 (ref 0.5–1.1)
Glucose: 75
Potassium: 3.9 (ref 3.4–5.3)
Sodium: 145 (ref 137–147)

## 2018-06-08 LAB — CBC AND DIFFERENTIAL
HCT: 36 (ref 36–46)
Hemoglobin: 11.7 — AB (ref 12.0–16.0)
Platelets: 250 (ref 150–399)
WBC: 9.1

## 2018-06-09 DIAGNOSIS — R1312 Dysphagia, oropharyngeal phase: Secondary | ICD-10-CM | POA: Diagnosis not present

## 2018-06-10 DIAGNOSIS — R1312 Dysphagia, oropharyngeal phase: Secondary | ICD-10-CM | POA: Diagnosis not present

## 2018-06-11 DIAGNOSIS — R1312 Dysphagia, oropharyngeal phase: Secondary | ICD-10-CM | POA: Diagnosis not present

## 2018-06-12 DIAGNOSIS — R1312 Dysphagia, oropharyngeal phase: Secondary | ICD-10-CM | POA: Diagnosis not present

## 2018-06-15 ENCOUNTER — Other Ambulatory Visit: Payer: Self-pay | Admitting: Internal Medicine

## 2018-06-15 DIAGNOSIS — L89153 Pressure ulcer of sacral region, stage 3: Secondary | ICD-10-CM | POA: Diagnosis not present

## 2018-06-15 DIAGNOSIS — R1312 Dysphagia, oropharyngeal phase: Secondary | ICD-10-CM | POA: Diagnosis not present

## 2018-06-15 MED ORDER — OXYCODONE-ACETAMINOPHEN 5-325 MG PO TABS: 1.0000 | ORAL_TABLET | Freq: Two times a day (BID) | ORAL | 0 refills | Status: DC

## 2018-06-15 MED ORDER — ALPRAZOLAM 0.5 MG PO TABS: 0.5000 mg | ORAL_TABLET | Freq: Two times a day (BID) | ORAL | 0 refills | Status: DC

## 2018-06-16 DIAGNOSIS — R1312 Dysphagia, oropharyngeal phase: Secondary | ICD-10-CM | POA: Diagnosis not present

## 2018-06-16 DIAGNOSIS — I10 Essential (primary) hypertension: Secondary | ICD-10-CM | POA: Diagnosis not present

## 2018-06-16 DIAGNOSIS — D649 Anemia, unspecified: Secondary | ICD-10-CM | POA: Diagnosis not present

## 2018-06-17 DIAGNOSIS — F329 Major depressive disorder, single episode, unspecified: Secondary | ICD-10-CM | POA: Diagnosis not present

## 2018-06-17 DIAGNOSIS — R1312 Dysphagia, oropharyngeal phase: Secondary | ICD-10-CM | POA: Diagnosis not present

## 2018-06-17 DIAGNOSIS — R4189 Other symptoms and signs involving cognitive functions and awareness: Secondary | ICD-10-CM | POA: Diagnosis not present

## 2018-06-17 DIAGNOSIS — R630 Anorexia: Secondary | ICD-10-CM | POA: Diagnosis not present

## 2018-06-17 DIAGNOSIS — F419 Anxiety disorder, unspecified: Secondary | ICD-10-CM | POA: Diagnosis not present

## 2018-06-18 ENCOUNTER — Non-Acute Institutional Stay (SKILLED_NURSING_FACILITY): Payer: Medicare Other | Admitting: Internal Medicine

## 2018-06-18 ENCOUNTER — Encounter: Payer: Self-pay | Admitting: Internal Medicine

## 2018-06-18 DIAGNOSIS — I10 Essential (primary) hypertension: Secondary | ICD-10-CM

## 2018-06-18 DIAGNOSIS — S069X6D Unspecified intracranial injury with loss of consciousness greater than 24 hours without return to pre-existing conscious level with patient surviving, subsequent encounter: Secondary | ICD-10-CM

## 2018-06-18 DIAGNOSIS — Z96641 Presence of right artificial hip joint: Secondary | ICD-10-CM

## 2018-06-18 DIAGNOSIS — R1312 Dysphagia, oropharyngeal phase: Secondary | ICD-10-CM | POA: Diagnosis not present

## 2018-06-18 NOTE — Progress Notes (Signed)
Location:  [facility] Room Number: 206-D Place of Service:  SNF (31)  [name], MD  [name], [name]  Extended Emergency Contact Information Primary Emergency Contact: [name] Address: [address]          [place], [place] [address] [name] of [place] Phone: [phone] Relation: Son Secondary Emergency Contact: [name],[name]          [place], [place] Relation: Sister    Allergies: Demerol [meperidine]  Chief Complaint  Patient presents with  . Medical Management of Chronic Issues    Routine [facility] visit    HPI: Patient is an 83 y.o. female who is being seen for issues of status post right hip arthroplasty, status post TBI, and hypertension.   Past Medical History:  Diagnosis Date  . Anasarca 05/21/2016  . Anxiety 11/03/2016  . Closed TBI (traumatic brain injury) ([place]) 04/21/2016  . Dysphagia 07/13/2016  . Gout   . Hypertension   . Obesity (BMI 30.0-34.9) 05/21/2016  . Status post insertion of percutaneous endoscopic gastrostomy (PEG) tube ([place]) 04/21/2016    Past Surgical History:  Procedure Laterality Date  . CHOLECYSTECTOMY N/A 07/21/2017   Procedure: LAPAROSCOPIC CHOLECYSTECTOMY;  Surgeon: Erroll Luna, MD;  Location: [facility];  Service: General;  Laterality: N/A;  . ERCP N/A 02/18/2018   Procedure: ENDOSCOPIC RETROGRADE CHOLANGIOPANCREATOGRAPHY (ERCP);  Surgeon: Milus Banister, MD;  [facility];  Service: Endoscopy;  Laterality: N/A;  . INTRAMEDULLARY (IM) NAIL INTERTROCHANTERIC Right 12/27/2016   Procedure: INTRAMEDULLARY (IM) NAIL INTERTROCHANTRIC RIGHT HIP;  Surgeon: Paralee Cancel, MD;  [facility];  Service: Orthopedics;  Laterality: Right;  . MASTECTOMY    . REMOVAL OF STONES  02/18/2018   Procedure: REMOVAL OF STONES;  Surgeon: Milus Banister, MD;  [facility];  Service:  Endoscopy;;  balloon sweep  . SPHINCTEROTOMY  02/18/2018   Procedure: SPHINCTEROTOMY;  Surgeon: Milus Banister, MD;  Location: [facility];  Service: Endoscopy;;    Allergies as of 06/18/2018      Reactions   Demerol [meperidine] Swelling      Medication List       Accurate as of Jun 18, 2018 11:59 PM. If you have any questions, ask your nurse or doctor.        STOP taking these medications   furosemide 20 MG tablet Commonly known as:  LASIX Stopped by:  Inocencio Homes, MD   Vitamin D (Ergocalciferol) 1.25 MG (50000 UT) Caps capsule Commonly known as:  DRISDOL Stopped by:  Inocencio Homes, MD     TAKE these medications   acetaminophen 325 MG tablet Commonly known as:  TYLENOL Take 650 mg by mouth every 6 (six) hours as needed.   ALPRAZolam 0.5 MG tablet Commonly known as:  XANAX Take 1 tablet (0.5 mg total) by mouth 2 (two) times daily.   bimatoprost 0.01 % Soln Commonly known as:  LUMIGAN Place 1 drop into both eyes at bedtime. For glaucoma   bisacodyl 10 MG suppository Commonly known as:  DULCOLAX Place 10 mg rectally daily as needed (constipation not relieved by MOM).   Ensure Take 237 mLs by mouth. TID DUE TO WEIGHT LOSS ( CHOCOLATE IF AVAILABLE )   nutrition supplement (JUVEN) Pack Take 1 packet by mouth 2 (two) times daily between meals. mix with 4-8oz water to aid in wound healing.   NUTRITIONAL SUPPLEMENT PO  Take 1 each by mouth daily. Magic Cup with lunch   escitalopram 5 MG tablet Commonly known as:  LEXAPRO Take 5 mg by mouth daily.   feeding supplement (PRO-STAT SUGAR FREE 64) Liqd Take 30 mLs by mouth 2 (two) times daily.   FLEET ENEMA RE Place 1 each rectally daily as needed (constipation not relieved by bisacodyl suppository).   gabapentin 600 MG tablet Commonly known as:  NEURONTIN Take 600 mg by mouth 3 (three) times daily.   heparin lock flush 100 UNIT/ML Soln injection Inject 500 Units into the vein. 100 UNIT/ML- FLUSH MIDLINE 3  CC AFTER ABT AND NS   hydrochlorothiazide 25 MG tablet Commonly known as:  HYDRODIURIL Take 12.5 mg by mouth daily. Take 0.5 tablet to = 12.5 mg   MILK OF MAGNESIA PO Take 30 mLs by mouth daily as needed (if no BM in 3 days).   mirtazapine 7.5 MG tablet Commonly known as:  REMERON Take 7.5 mg by mouth. TAKE 1 TABLET BY MOUTH AT BEDTIME FOR APPETITE STIMULANT   MULTIPLE VITAMINS-MINERALS PO Take by mouth. TAKE 1 TABLET BY MOUTH ONCE DAILY TO PROMOTE WOUND HEALING   oxyCODONE-acetaminophen 5-325 MG tablet Commonly known as:  PERCOCET/ROXICET Take 1 tablet by mouth 2 (two) times daily. Hold for somnolence   potassium chloride SA 20 MEQ tablet Commonly known as:  K-DUR Take 20 mEq by mouth daily.   sennosides-docusate sodium 8.6-50 MG tablet Commonly known as:  SENOKOT-S Take 2 tablets by mouth 2 (two) times daily as needed for constipation.   sodium chloride 0.45 % Inject into the vein. ADMINISTER IVF 75 ML/HR X 4 LITERS FOR HYPERNATREMIA and 0.9% SYRINGE- FLUSH MIDLINE 10 CC BEFORE ABT TX   venlafaxine XR 75 MG 24 hr capsule Commonly known as:  EFFEXOR-XR Take 75 mg by mouth 2 (two) times daily. FOR DEPRESSION (DO NOT CRUSH)   Vitamin D3 1.25 MG (50000 UT) Caps Take 1 capsule by mouth once a week.       No orders of the defined types were placed in this encounter.   Immunization History  Administered Date(s) Administered  . Influenza-Unspecified 12/19/2016, 11/26/2017  . PPD Test 04/29/2016  . Pneumococcal Conjugate-13 08/06/2016  . Pneumococcal Polysaccharide-23 04/15/2016  . Tdap 03/31/2016    Social History   Tobacco Use  . Smoking status: Never Smoker  . Smokeless tobacco: Never Used  Substance Use Topics  . Alcohol use: Not Currently    Review of Systems  DATA OBTAINED: from patient-very limited; nursing-no acute concerns GENERAL:  no fevers, fatigue, appetite changes SKIN: No itching, rash HEENT: No complaint RESPIRATORY: No cough, wheezing, SOB  CARDIAC: No chest pain, palpitations, lower extremity edema  GI: No abdominal pain, No N/V/D or constipation, No heartburn or reflux  GU: No dysuria, frequency or urgency, or incontinence  MUSCULOSKELETAL: No unrelieved bone/joint pain NEUROLOGIC: No headache, dizziness  PSYCHIATRIC: No overt anxiety or sadness  Vitals:   06/18/18 1231  BP: 96/78  Pulse: 76  Resp: 18  Temp: (!) 97 F (36.1 C)  SpO2: 93%   Body mass index is 22.07 kg/m. Physical Exam  GENERAL APPEARANCE: Alert, moderately conversant, No acute distress  SKIN: No diaphoresis rash HEENT: Unremarkable RESPIRATORY: Breathing is even, unlabored. Lung sounds are clear   CARDIOVASCULAR: Heart RRR no murmurs, rubs or gallops. No peripheral edema  GASTROINTESTINAL: Abdomen is soft, non-tender, not distended w/ normal bowel sounds.  GENITOURINARY: Bladder non tender, not distended  MUSCULOSKELETAL: No abnormal joints or musculature  NEUROLOGIC: Cranial nerves 2-12 grossly intact. Moves all extremities PSYCHIATRIC: Mood and affect status post TBI with some dementia, no behavioral issues  Patient Active Problem List   Diagnosis Date Noted  . Hypernatremia 05/26/2018  . HCAP (healthcare-associated pneumonia) 05/18/2018  . Acute renal failure (ARF) ([place]) 05/07/2018  . MRSA (methicillin resistant Staphylococcus aureus) septicemia ([place]) 02/23/2018  . Hypokalemia 02/23/2018  . Bile duct stone   . E. coli sepsis ([place]) 02/17/2018  . Cholecystitis 07/31/2017  . Cholecystitis, acute with cholelithiasis 07/20/2017  . Pressure injury of skin 07/20/2017  . History of total right hip arthroplasty 01/05/2017  . Acute blood loss as cause of postoperative anemia 01/05/2017  . Escherichia coli urinary tract infection 01/05/2017  . Acute encephalopathy 01/05/2017  . Depression, recurrent ([place]) 01/05/2017  . Polyneuropathy 01/05/2017  . Closed right hip fracture ([place]) 12/25/2016  . Anxiety 11/03/2016  . Dysphagia 07/13/2016  .  Anasarca 05/21/2016  . Obesity (BMI 30.0-34.9) 05/21/2016  . Closed TBI (traumatic brain injury) ([place]) 04/21/2016  . Status post insertion of percutaneous endoscopic gastrostomy (PEG) tube ([place]) 04/21/2016  . Facial laceration 04/21/2016  . Cheek wound, right, subsequent encounter 04/21/2016  . Urinary tract infection due to extended-spectrum beta lactamase (ESBL) producing Escherichia coli 04/21/2016  . Hypotension 04/21/2016  . Gout   . Hypertension     CMP     Component Value Date/Time   NA 145 06/08/2018   NA 145 07/28/2017   K 3.9 06/08/2018   K 3.7 07/28/2017   CL 112 (H) 02/21/2018 0325   CO2 25 02/21/2018 0325   GLUCOSE 87 02/21/2018 0325   BUN 25 (A) 06/08/2018   CREATININE 0.6 06/08/2018   CREATININE 0.42 (L) 02/21/2018 0325   CREATININE 0.39 07/28/2017   CALCIUM 7.6 (L) 02/21/2018 0325   CALCIUM 8.1 07/28/2017   PROT 4.8 (L) 02/21/2018 0325   ALBUMIN 1.9 (L) 02/21/2018 0325   AST 30 02/21/2018 0325   ALT 33 02/21/2018 0325   ALKPHOS 324 (H) 02/21/2018 0325   BILITOT 1.1 02/21/2018 0325   GFRNONAA >60 02/21/2018 0325   GFRNONAA >90 07/28/2017   GFRAA >60 02/21/2018 0325   Recent Labs    02/16/18 2227  02/19/18 0311 02/20/18 0325 02/21/18 0325  05/28/18 05/29/18 06/08/18  NA  --    < > 145 147* 146*   < > 142 143 145  K  --    < > 3.3* 3.7 3.0*   < > 3.9 3.8 3.9  CL  --    < > 115* 116* 112*  --   --   --   --   CO2  --    < > 24 24 25   --   --   --   --   GLUCOSE  --    < > 96 84 87  --   --   --   --   BUN  --    < > 12 10 8    < > 20 20 25*  CREATININE  --    < > 0.54 0.54 0.42*   < > 0.6 0.7 0.6  CALCIUM  --    < > 7.7* 7.9* 7.6*  --   --   --   --   MG 2.1  --   --   --   --   --   --   --   --    < > = values in this interval not displayed.  Recent Labs    02/19/18 0311 02/20/18 0325 02/21/18 0325  AST 71* 45* 30  ALT 56* 44 33  ALKPHOS 299* 358* 324*  BILITOT 2.2* 1.9* 1.1  PROT 5.0* 5.0* 4.8*  ALBUMIN 1.9* 2.1* 1.9*   Recent Labs     02/16/18 1915 02/17/18 0435 02/18/18 0308 02/19/18 0311 02/20/18 0325 02/21/18 0325  05/11/18 05/25/18 06/08/18  WBC 18.1* 19.0* 14.6* 9.9 10.8* 7.6   < > 7.2 9.4 9.1  NEUTROABS 16.8* 16.7* 13.2*  --   --   --   --   --   --   --   HGB 12.5 10.1* 12.1 10.3* 10.4* 10.1*   < > 10.3* 10.5* 11.7*  HCT 42.8 34.9* 40.8 35.6* 35.6* 34.1*   < > 32* 32* 36  MCV 89.5 90.6 87.9 89.7 90.4 89.0  --   --   --   --   PLT 265 193 124* 197 232 192   < > 260 226 250   < > = values in this interval not displayed.   No results for input(s): CHOL, LDLCALC, TRIG in the last 8760 hours.  Invalid input(s): HCL No results found for: MICROALBUR Lab Results  Component Value Date   TSH 1.68 08/06/2013   No results found for: HGBA1C No results found for: CHOL, HDL, LDLCALC, LDLDIRECT, TRIG, CHOLHDL  Significant Diagnostic Results in last 30 days:  No results found.  Assessment and Plan  History of total right hip arthroplasty No apparent problems; patient is wheelchair-bound but never appears in pain or discomfort; continue supportive care  Closed TBI (traumatic brain injury) ([place]) Moderate to severe mental compromise with overlying dementia; but patient does fairly well; continue supportive care  Hypertension Controlled; continue Lasix 20 mg daily      D. [name], MD

## 2018-06-19 DIAGNOSIS — R1312 Dysphagia, oropharyngeal phase: Secondary | ICD-10-CM | POA: Diagnosis not present

## 2018-06-20 ENCOUNTER — Encounter: Payer: Self-pay | Admitting: Internal Medicine

## 2018-06-20 NOTE — Assessment & Plan Note (Signed)
Controlled; continue Lasix 20 mg daily

## 2018-06-20 NOTE — Assessment & Plan Note (Signed)
Moderate to severe mental compromise with overlying dementia; but patient does fairly well; continue supportive care

## 2018-06-20 NOTE — Assessment & Plan Note (Signed)
No apparent problems; patient is wheelchair-bound but never appears in pain or discomfort; continue supportive care

## 2018-06-22 DIAGNOSIS — L89153 Pressure ulcer of sacral region, stage 3: Secondary | ICD-10-CM | POA: Diagnosis not present

## 2018-06-22 DIAGNOSIS — R1312 Dysphagia, oropharyngeal phase: Secondary | ICD-10-CM | POA: Diagnosis not present

## 2018-06-23 DIAGNOSIS — I10 Essential (primary) hypertension: Secondary | ICD-10-CM | POA: Diagnosis not present

## 2018-06-23 DIAGNOSIS — D649 Anemia, unspecified: Secondary | ICD-10-CM | POA: Diagnosis not present

## 2018-06-23 DIAGNOSIS — R1312 Dysphagia, oropharyngeal phase: Secondary | ICD-10-CM | POA: Diagnosis not present

## 2018-06-24 DIAGNOSIS — R1312 Dysphagia, oropharyngeal phase: Secondary | ICD-10-CM | POA: Diagnosis not present

## 2018-06-25 DIAGNOSIS — R1312 Dysphagia, oropharyngeal phase: Secondary | ICD-10-CM | POA: Diagnosis not present

## 2018-06-26 DIAGNOSIS — R1312 Dysphagia, oropharyngeal phase: Secondary | ICD-10-CM | POA: Diagnosis not present

## 2018-06-29 DIAGNOSIS — L89153 Pressure ulcer of sacral region, stage 3: Secondary | ICD-10-CM | POA: Diagnosis not present

## 2018-06-29 DIAGNOSIS — R1312 Dysphagia, oropharyngeal phase: Secondary | ICD-10-CM | POA: Diagnosis not present

## 2018-06-30 ENCOUNTER — Other Ambulatory Visit: Payer: Self-pay | Admitting: Internal Medicine

## 2018-06-30 DIAGNOSIS — D649 Anemia, unspecified: Secondary | ICD-10-CM | POA: Diagnosis not present

## 2018-06-30 DIAGNOSIS — I10 Essential (primary) hypertension: Secondary | ICD-10-CM | POA: Diagnosis not present

## 2018-06-30 DIAGNOSIS — R1312 Dysphagia, oropharyngeal phase: Secondary | ICD-10-CM | POA: Diagnosis not present

## 2018-07-01 ENCOUNTER — Other Ambulatory Visit: Payer: Self-pay | Admitting: Internal Medicine

## 2018-07-01 DIAGNOSIS — R1312 Dysphagia, oropharyngeal phase: Secondary | ICD-10-CM | POA: Diagnosis not present

## 2018-07-02 DIAGNOSIS — R1312 Dysphagia, oropharyngeal phase: Secondary | ICD-10-CM | POA: Diagnosis not present

## 2018-07-03 DIAGNOSIS — R1312 Dysphagia, oropharyngeal phase: Secondary | ICD-10-CM | POA: Diagnosis not present

## 2018-07-06 DIAGNOSIS — I10 Essential (primary) hypertension: Secondary | ICD-10-CM | POA: Diagnosis not present

## 2018-07-06 DIAGNOSIS — L89153 Pressure ulcer of sacral region, stage 3: Secondary | ICD-10-CM | POA: Diagnosis not present

## 2018-07-06 DIAGNOSIS — R1312 Dysphagia, oropharyngeal phase: Secondary | ICD-10-CM | POA: Diagnosis not present

## 2018-07-06 DIAGNOSIS — D649 Anemia, unspecified: Secondary | ICD-10-CM | POA: Diagnosis not present

## 2018-07-10 DIAGNOSIS — R630 Anorexia: Secondary | ICD-10-CM | POA: Diagnosis not present

## 2018-07-10 DIAGNOSIS — R4189 Other symptoms and signs involving cognitive functions and awareness: Secondary | ICD-10-CM | POA: Diagnosis not present

## 2018-07-10 DIAGNOSIS — F329 Major depressive disorder, single episode, unspecified: Secondary | ICD-10-CM | POA: Diagnosis not present

## 2018-07-10 DIAGNOSIS — F419 Anxiety disorder, unspecified: Secondary | ICD-10-CM | POA: Diagnosis not present

## 2018-07-13 DIAGNOSIS — M533 Sacrococcygeal disorders, not elsewhere classified: Secondary | ICD-10-CM | POA: Diagnosis not present

## 2018-07-13 DIAGNOSIS — D649 Anemia, unspecified: Secondary | ICD-10-CM | POA: Diagnosis not present

## 2018-07-13 DIAGNOSIS — L89153 Pressure ulcer of sacral region, stage 3: Secondary | ICD-10-CM | POA: Diagnosis not present

## 2018-07-13 DIAGNOSIS — I10 Essential (primary) hypertension: Secondary | ICD-10-CM | POA: Diagnosis not present

## 2018-07-17 ENCOUNTER — Other Ambulatory Visit: Payer: Self-pay | Admitting: Internal Medicine

## 2018-07-17 MED ORDER — OXYCODONE-ACETAMINOPHEN 5-325 MG PO TABS: 1.0000 | ORAL_TABLET | Freq: Two times a day (BID) | ORAL | 0 refills | Status: DC

## 2018-07-20 ENCOUNTER — Encounter: Payer: Self-pay | Admitting: Internal Medicine

## 2018-07-20 ENCOUNTER — Non-Acute Institutional Stay (SKILLED_NURSING_FACILITY): Payer: Medicare Other | Admitting: Internal Medicine

## 2018-07-20 DIAGNOSIS — F339 Major depressive disorder, recurrent, unspecified: Secondary | ICD-10-CM

## 2018-07-20 DIAGNOSIS — I10 Essential (primary) hypertension: Secondary | ICD-10-CM | POA: Diagnosis not present

## 2018-07-20 DIAGNOSIS — M109 Gout, unspecified: Secondary | ICD-10-CM

## 2018-07-20 DIAGNOSIS — L89153 Pressure ulcer of sacral region, stage 3: Secondary | ICD-10-CM | POA: Diagnosis not present

## 2018-07-20 DIAGNOSIS — G629 Polyneuropathy, unspecified: Secondary | ICD-10-CM

## 2018-07-20 DIAGNOSIS — D649 Anemia, unspecified: Secondary | ICD-10-CM | POA: Diagnosis not present

## 2018-07-20 NOTE — Progress Notes (Signed)
Location:  [facility] Room Number: 206-D Place of Service:  SNF (31)  [name], MD  [name], [name]  Extended Emergency Contact Information Primary Emergency Contact: [name] Address: [address]          [place], [place] [address] [name] of [place] Phone: [phone] Relation: Son Secondary Emergency Contact: [name],[name]          [place], [place] Relation: Sister    Allergies: Demerol [meperidine]  Chief Complaint  Patient presents with  . Medical Management of Chronic Issues    Routine [facility] visit    HPI: Patient is an 83 y.o. female who is being seen for routine issues of polyneuropathy, gout, and depression.  Past Medical History:  Diagnosis Date  . Anasarca 05/21/2016  . Anxiety 11/03/2016  . Closed TBI (traumatic brain injury) ([place]) 04/21/2016  . Dysphagia 07/13/2016  . Gout   . Hypertension   . Obesity (BMI 30.0-34.9) 05/21/2016  . Status post insertion of percutaneous endoscopic gastrostomy (PEG) tube ([place]) 04/21/2016    Past Surgical History:  Procedure Laterality Date  . CHOLECYSTECTOMY N/A 07/21/2017   Procedure: LAPAROSCOPIC CHOLECYSTECTOMY;  Surgeon: Erroll Luna, MD;  Location: [facility];  Service: General;  Laterality: N/A;  . ERCP N/A 02/18/2018   Procedure: ENDOSCOPIC RETROGRADE CHOLANGIOPANCREATOGRAPHY (ERCP);  Surgeon: Milus Banister, MD;  Location: [facility];  Service: Endoscopy;  Laterality: N/A;  . INTRAMEDULLARY (IM) NAIL INTERTROCHANTERIC Right 12/27/2016   Procedure: INTRAMEDULLARY (IM) NAIL INTERTROCHANTRIC RIGHT HIP;  Surgeon: Paralee Cancel, MD;  Location: [facility];  Service: Orthopedics;  Laterality: Right;  . MASTECTOMY    . REMOVAL OF STONES  02/18/2018   Procedure: REMOVAL OF STONES;  Surgeon: Milus Banister, MD;  [facility]: [facility];  Service: Endoscopy;;  balloon sweep  .  SPHINCTEROTOMY  02/18/2018   Procedure: SPHINCTEROTOMY;  Surgeon: Milus Banister, MD;  Location: [facility];  Service: Endoscopy;;    Allergies as of 07/20/2018      Reactions   Demerol [meperidine] Swelling      Medication List       Accurate as of July 20, 2018 11:59 PM. If you have any questions, ask your nurse or doctor.        STOP taking these medications   heparin lock flush 100 UNIT/ML Soln injection Stopped by: Inocencio Homes, MD     TAKE these medications   acetaminophen 325 MG tablet Commonly known as: TYLENOL Take 650 mg by mouth every 6 (six) hours as needed.   ALPRAZolam 0.5 MG tablet Commonly known as: XANAX Take 1 tablet (0.5 mg total) by mouth 2 (two) times daily.   bimatoprost 0.01 % Soln Commonly known as: LUMIGAN Place 1 drop into both eyes at bedtime. For glaucoma   bisacodyl 10 MG suppository Commonly known as: DULCOLAX Place 10 mg rectally daily as needed (constipation not relieved by MOM).   Ensure Take 237 mLs by mouth. TID DUE TO WEIGHT LOSS ( CHOCOLATE IF AVAILABLE )   nutrition supplement (JUVEN) Pack Take 1 packet by mouth 2 (two) times daily between meals. mix with 4-8oz water to aid in wound healing.   NUTRITIONAL SUPPLEMENT PO Take 1 each by mouth daily. Magic Cup with lunch   escitalopram 5 MG tablet Commonly known as: LEXAPRO Take 5 mg by mouth daily.   feeding supplement (PRO-STAT SUGAR FREE 64) Liqd  Take 30 mLs by mouth 2 (two) times daily.   FLEET ENEMA RE Place 1 each rectally daily as needed (constipation not relieved by bisacodyl suppository).   gabapentin 600 MG tablet Commonly known as: NEURONTIN Take 600 mg by mouth 3 (three) times daily.   hydrochlorothiazide 25 MG tablet Commonly known as: HYDRODIURIL Take 12.5 mg by mouth daily. Take 0.5 tablet to = 12.5 mg   MILK OF MAGNESIA PO Take 30 mLs by mouth daily as needed (if no BM in 3 days).   mirtazapine 7.5 MG tablet Commonly known as: REMERON Take 7.5  mg by mouth. TAKE 1 TABLET BY MOUTH AT BEDTIME FOR APPETITE STIMULANT   MULTIPLE VITAMINS-MINERALS PO Take by mouth. TAKE 1 TABLET BY MOUTH ONCE DAILY TO PROMOTE WOUND HEALING   oxyCODONE-acetaminophen 5-325 MG tablet Commonly known as: PERCOCET/ROXICET Take 1 tablet by mouth 2 (two) times daily. Hold for somnolence   potassium chloride SA 20 MEQ tablet Commonly known as: K-DUR Take 20 mEq by mouth daily.   sennosides-docusate sodium 8.6-50 MG tablet Commonly known as: SENOKOT-S Take 2 tablets by mouth 2 (two) times daily as needed for constipation.   sodium chloride 0.45 % Inject into the vein. ADMINISTER IVF 75 ML/HR X 4 LITERS FOR HYPERNATREMIA and 0.9% SYRINGE- FLUSH MIDLINE 10 CC BEFORE ABT TX   venlafaxine XR 75 MG 24 hr capsule Commonly known as: EFFEXOR-XR Take 75 mg by mouth 2 (two) times daily. FOR DEPRESSION (DO NOT CRUSH)   Vitamin D3 1.25 MG (50000 UT) Caps Take 1 capsule by mouth once a week.       No orders of the defined types were placed in this encounter.   Immunization History  Administered Date(s) Administered  . Influenza-Unspecified 12/19/2016, 11/26/2017  . PPD Test 04/29/2016  . Pneumococcal Conjugate-13 08/06/2016  . Pneumococcal Polysaccharide-23 04/15/2016  . Tdap 03/31/2016    Social History   Tobacco Use  . Smoking status: Never Smoker  . Smokeless tobacco: Never Used  Substance Use Topics  . Alcohol use: Not Currently    Review of Systems  DATA OBTAINED: from patient, nurse, medical record, family membe GENERAL:  no fever, fatigue, appetite changes SKIN: No itching, rash HEENT: No complaint RESPIRATORY: No cough, wheezing, SOB CARDIAC: No chest pain, palpitations, lower extremity edema  GI: No abdominal pain, No N/V/D or constipation, No heartburn or reflux  GU: No dysuria, frequency or urgency, or incontinence  MUSCULOSKELETAL: No unrelieved bone/joint pain NEUROLOGIC: No headache, dizziness  PSYCHIATRIC: No overt anxiety  or sadness  Vitals:   07/20/18 1254  BP: (!) 119/58  Pulse: 73  Resp: 18  Temp: 97.9 F (36.6 C)  SpO2: 93%   Body mass index is 21.27 kg/m. Physical Exam  GENERAL APPEARANCE: Alert, No acute distress  SKIN: No diaphoresis rash HEENT: Unremarkable RESPIRATORY: Breathing is even, unlabored. Lung sounds are clear   CARDIOVASCULAR: Heart RRR no murmurs, rubs or gallops. No peripheral edema  GASTROINTESTINAL: Abdomen is soft, non-tender, not distended w/ normal bowel sounds.  GENITOURINARY: Bladder non tender, not distended  MUSCULOSKELETAL: No abnormal joints or musculature NEUROLOGIC: Cranial nerves 2-12 grossly intact. Moves all extremities PSYCHIATRIC: Mood and affect appropriate with dementia, no behavioral issues  Patient Active Problem List   Diagnosis Date Noted  . Hypernatremia 05/26/2018  . HCAP (healthcare-associated pneumonia) 05/18/2018  . Acute renal failure (ARF) ([place]) 05/07/2018  . MRSA (methicillin resistant Staphylococcus aureus) septicemia ([place]) 02/23/2018  . Hypokalemia 02/23/2018  . Bile duct stone   .  E. coli sepsis ([place]) 02/17/2018  . Cholecystitis 07/31/2017  . Cholecystitis, acute with cholelithiasis 07/20/2017  . Pressure injury of skin 07/20/2017  . History of total right hip arthroplasty 01/05/2017  . Acute blood loss as cause of postoperative anemia 01/05/2017  . Escherichia coli urinary tract infection 01/05/2017  . Acute encephalopathy 01/05/2017  . Depression, recurrent ([place]) 01/05/2017  . Polyneuropathy 01/05/2017  . Closed right hip fracture ([place]) 12/25/2016  . Anxiety 11/03/2016  . Dysphagia 07/13/2016  . Anasarca 05/21/2016  . Obesity (BMI 30.0-34.9) 05/21/2016  . Closed TBI (traumatic brain injury) ([place]) 04/21/2016  . Status post insertion of percutaneous endoscopic gastrostomy (PEG) tube ([place]) 04/21/2016  . Facial laceration 04/21/2016  . Cheek wound, right, subsequent encounter 04/21/2016  . Urinary tract infection due to  extended-spectrum beta lactamase (ESBL) producing Escherichia coli 04/21/2016  . Hypotension 04/21/2016  . Gout   . Hypertension     CMP     Component Value Date/Time   NA 145 06/08/2018   NA 145 07/28/2017   K 3.9 06/08/2018   K 3.7 07/28/2017   CL 112 (H) 02/21/2018 0325   CO2 25 02/21/2018 0325   GLUCOSE 87 02/21/2018 0325   BUN 25 (A) 06/08/2018   CREATININE 0.6 06/08/2018   CREATININE 0.42 (L) 02/21/2018 0325   CREATININE 0.39 07/28/2017   CALCIUM 7.6 (L) 02/21/2018 0325   CALCIUM 8.1 07/28/2017   PROT 4.8 (L) 02/21/2018 0325   ALBUMIN 1.9 (L) 02/21/2018 0325   AST 30 02/21/2018 0325   ALT 33 02/21/2018 0325   ALKPHOS 324 (H) 02/21/2018 0325   BILITOT 1.1 02/21/2018 0325   GFRNONAA >60 02/21/2018 0325   GFRNONAA >90 07/28/2017   GFRAA >60 02/21/2018 0325   Recent Labs    02/16/18 2227  02/19/18 0311 02/20/18 0325 02/21/18 0325  05/28/18 05/29/18 06/08/18  NA  --    < > 145 147* 146*   < > 142 143 145  K  --    < > 3.3* 3.7 3.0*   < > 3.9 3.8 3.9  CL  --    < > 115* 116* 112*  --   --   --   --   CO2  --    < > 24 24 25   --   --   --   --   GLUCOSE  --    < > 96 84 87  --   --   --   --   BUN  --    < > 12 10 8    < > 20 20 25*  CREATININE  --    < > 0.54 0.54 0.42*   < > 0.6 0.7 0.6  CALCIUM  --    < > 7.7* 7.9* 7.6*  --   --   --   --   MG 2.1  --   --   --   --   --   --   --   --    < > = values in this interval not displayed.   Recent Labs    02/19/18 0311 02/20/18 0325 02/21/18 0325  AST 71* 45* 30  ALT 56* 44 33  ALKPHOS 299* 358* 324*  BILITOT 2.2* 1.9* 1.1  PROT 5.0* 5.0* 4.8*  ALBUMIN 1.9* 2.1* 1.9*   Recent Labs    02/16/18 1915 02/17/18 0435 02/18/18 0308 02/19/18 0311 02/20/18 0325 02/21/18 0325  05/11/18 05/25/18 06/08/18  WBC 18.1* 19.0* 14.6* 9.9 10.8* 7.6   < >  7.2 9.4 9.1  NEUTROABS 16.8* 16.7* 13.2*  --   --   --   --   --   --   --   HGB 12.5 10.1* 12.1 10.3* 10.4* 10.1*   < > 10.3* 10.5* 11.7*  HCT 42.8 34.9* 40.8 35.6*  35.6* 34.1*   < > 32* 32* 36  MCV 89.5 90.6 87.9 89.7 90.4 89.0  --   --   --   --   PLT 265 193 124* 197 232 192   < > 260 226 250   < > = values in this interval not displayed.   No results for input(s): CHOL, LDLCALC, TRIG in the last 8760 hours.  Invalid input(s): HCL No results found for: MICROALBUR Lab Results  Component Value Date   TSH 1.68 08/06/2013   No results found for: HGBA1C No results found for: CHOL, HDL, LDLCALC, LDLDIRECT, TRIG, CHOLHDL  Significant Diagnostic Results in last 30 days:  No results found.  Assessment and Plan  Polyneuropathy No reported problems; continue Neurontin 300 mg 3 times daily  Gout On no medications; no reported flares; continue supportive care  Depression, recurrent ([place]) Appears reasonably controlled; continue Effexor 75 mg p.o. twice daily and Lexapro 5 mg daily    [name], MD

## 2018-07-21 ENCOUNTER — Encounter: Payer: Self-pay | Admitting: Internal Medicine

## 2018-07-21 DIAGNOSIS — I10 Essential (primary) hypertension: Secondary | ICD-10-CM | POA: Diagnosis not present

## 2018-07-21 DIAGNOSIS — E559 Vitamin D deficiency, unspecified: Secondary | ICD-10-CM | POA: Diagnosis not present

## 2018-07-21 DIAGNOSIS — D649 Anemia, unspecified: Secondary | ICD-10-CM | POA: Diagnosis not present

## 2018-07-21 NOTE — Assessment & Plan Note (Signed)
On no medications; no reported flares; continue supportive care

## 2018-07-21 NOTE — Assessment & Plan Note (Signed)
Appears reasonably controlled; continue Effexor 75 mg p.o. twice daily and Lexapro 5 mg daily

## 2018-07-21 NOTE — Assessment & Plan Note (Signed)
No reported problems; continue Neurontin 300 mg 3 times daily

## 2018-07-22 DIAGNOSIS — D649 Anemia, unspecified: Secondary | ICD-10-CM | POA: Diagnosis not present

## 2018-07-22 DIAGNOSIS — I10 Essential (primary) hypertension: Secondary | ICD-10-CM | POA: Diagnosis not present

## 2018-07-23 DIAGNOSIS — L89150 Pressure ulcer of sacral region, unstageable: Secondary | ICD-10-CM | POA: Diagnosis not present

## 2018-07-23 DIAGNOSIS — I10 Essential (primary) hypertension: Secondary | ICD-10-CM | POA: Diagnosis not present

## 2018-07-23 DIAGNOSIS — E870 Hyperosmolality and hypernatremia: Secondary | ICD-10-CM | POA: Diagnosis not present

## 2018-07-23 DIAGNOSIS — D649 Anemia, unspecified: Secondary | ICD-10-CM | POA: Diagnosis not present

## 2018-07-24 DIAGNOSIS — Z79899 Other long term (current) drug therapy: Secondary | ICD-10-CM | POA: Diagnosis not present

## 2018-07-24 DIAGNOSIS — D649 Anemia, unspecified: Secondary | ICD-10-CM | POA: Diagnosis not present

## 2018-07-27 ENCOUNTER — Encounter: Payer: Self-pay | Admitting: Internal Medicine

## 2018-07-27 ENCOUNTER — Non-Acute Institutional Stay (SKILLED_NURSING_FACILITY): Payer: Medicare Other | Admitting: Internal Medicine

## 2018-07-27 DIAGNOSIS — E870 Hyperosmolality and hypernatremia: Secondary | ICD-10-CM | POA: Diagnosis not present

## 2018-07-27 DIAGNOSIS — I10 Essential (primary) hypertension: Secondary | ICD-10-CM | POA: Diagnosis not present

## 2018-07-27 DIAGNOSIS — D649 Anemia, unspecified: Secondary | ICD-10-CM | POA: Diagnosis not present

## 2018-07-27 NOTE — Progress Notes (Signed)
Location:  [facility] Room Number: 206-D Place of Service:  SNF (31)  [name], MD  [name], [name]  Extended Emergency Contact Information Primary Emergency Contact: [name] Address: [address]          [place], [place] [address] [name] of [place] Phone: [phone] Relation: Son Secondary Emergency Contact: [name],[name]          [place], [place] Relation: Sister    Allergies: Demerol [meperidine]  Chief Complaint  Patient presents with  . Acute Visit    The patient has hypernatremia    HPI: Patient is an 83 y.o. female who  Is being seen for a sodium of 148, chloride 104, CO2 30 and BUN 29/creatinine 0.66.  Patient is not symptomatic with this but we have had trouble with this in the past so we want to be very aggressive.  Past Medical History:  Diagnosis Date  . Anasarca 05/21/2016  . Anxiety 11/03/2016  . Closed TBI (traumatic brain injury) ([place]) 04/21/2016  . Dysphagia 07/13/2016  . Gout   . Hypertension   . Obesity (BMI 30.0-34.9) 05/21/2016  . Status post insertion of percutaneous endoscopic gastrostomy (PEG) tube ([place]) 04/21/2016    Past Surgical History:  Procedure Laterality Date  . CHOLECYSTECTOMY N/A 07/21/2017   Procedure: LAPAROSCOPIC CHOLECYSTECTOMY;  Surgeon: Erroll Luna, MD;  Location: [facility];  Service: General;  Laterality: N/A;  . ERCP N/A 02/18/2018   Procedure: ENDOSCOPIC RETROGRADE CHOLANGIOPANCREATOGRAPHY (ERCP);  Surgeon: Milus Banister, MD;  Location: [facility];  Service: Endoscopy;  Laterality: N/A;  . INTRAMEDULLARY (IM) NAIL INTERTROCHANTERIC Right 12/27/2016   Procedure: INTRAMEDULLARY (IM) NAIL INTERTROCHANTRIC RIGHT HIP;  Surgeon: Paralee Cancel, MD;  [facility];  Service: Orthopedics;  Laterality: Right;  . MASTECTOMY    . REMOVAL OF STONES  02/18/2018   Procedure: REMOVAL OF  STONES;  Surgeon: Milus Banister, MD;  [facility];  Service: Endoscopy;;  balloon sweep  . SPHINCTEROTOMY  02/18/2018   Procedure: SPHINCTEROTOMY;  Surgeon: Milus Banister, MD;  Location: [facility];  Service: Endoscopy;;    Allergies as of 07/27/2018      Reactions   Demerol [meperidine] Swelling      Medication List       Accurate as of July 27, 2018  4:43 PM. If you have any questions, ask your nurse or doctor.        STOP taking these medications   sodium chloride 0.45 % Stopped by: Inocencio Homes, MD     TAKE these medications   acetaminophen 325 MG tablet Commonly known as: TYLENOL Take 650 mg by mouth every 6 (six) hours as needed.   ALPRAZolam 0.5 MG tablet Commonly known as: XANAX Take 1 tablet (0.5 mg total) by mouth 2 (two) times daily.   bimatoprost 0.01 % Soln Commonly known as: LUMIGAN Place 1 drop into both eyes at bedtime. For glaucoma   bisacodyl 10 MG suppository Commonly known as: DULCOLAX Place 10 mg rectally daily as needed (constipation not relieved by MOM).   Ensure Take 237 mLs by mouth. TID DUE TO WEIGHT LOSS ( CHOCOLATE IF AVAILABLE )   nutrition supplement (JUVEN) Pack Take 1 packet by mouth 2 (two) times daily between meals. mix with 4-8oz water to aid in wound healing.   NUTRITIONAL SUPPLEMENT PO Take 1 each by mouth daily. Magic Cup with lunch  escitalopram 5 MG tablet Commonly known as: LEXAPRO Take 5 mg by mouth daily.   feeding supplement (PRO-STAT SUGAR FREE 64) Liqd Take 30 mLs by mouth 2 (two) times daily.   FLEET ENEMA RE Place 1 each rectally daily as needed (constipation not relieved by bisacodyl suppository).   gabapentin 600 MG tablet Commonly known as: NEURONTIN Take 600 mg by mouth 3 (three) times daily.   hydrochlorothiazide 25 MG tablet Commonly known as: HYDRODIURIL Take 12.5 mg by mouth daily. Take 0.5 tablet to = 12.5 mg   MILK OF MAGNESIA PO Take 30 mLs by mouth daily as needed (if no  BM in 3 days).   mirtazapine 7.5 MG tablet Commonly known as: REMERON Take 7.5 mg by mouth. TAKE 1 TABLET BY MOUTH AT BEDTIME FOR APPETITE STIMULANT   MULTIPLE VITAMINS-MINERALS PO Take by mouth. TAKE 1 TABLET BY MOUTH ONCE DAILY TO PROMOTE WOUND HEALING   oxyCODONE-acetaminophen 5-325 MG tablet Commonly known as: PERCOCET/ROXICET Take 1 tablet by mouth 2 (two) times daily. Hold for somnolence   potassium chloride SA 20 MEQ tablet Commonly known as: K-DUR Take 20 mEq by mouth daily.   sennosides-docusate sodium 8.6-50 MG tablet Commonly known as: SENOKOT-S Take 2 tablets by mouth 2 (two) times daily as needed for constipation.   venlafaxine XR 75 MG 24 hr capsule Commonly known as: EFFEXOR-XR Take 75 mg by mouth 2 (two) times daily. FOR DEPRESSION (DO NOT CRUSH)   Vitamin D3 1.25 MG (50000 UT) Caps Take 1 capsule by mouth once a week.       No orders of the defined types were placed in this encounter.   Immunization History  Administered Date(s) Administered  . Influenza-Unspecified 12/19/2016, 11/26/2017  . PPD Test 04/29/2016  . Pneumococcal Conjugate-13 08/06/2016  . Pneumococcal Polysaccharide-23 04/15/2016  . Tdap 03/31/2016    Social History   Tobacco Use  . Smoking status: Never Smoker  . Smokeless tobacco: Never Used  Substance Use Topics  . Alcohol use: Not Currently    Review of Systems  DATA OBTAINED: from nurse GENERAL:  no fevers, fatigue, appetite changes SKIN: No itching, rash HEENT: No complaint RESPIRATORY: No cough, wheezing, SOB CARDIAC: No chest pain, palpitations, lower extremity edema  GI: No abdominal pain, No N/V/D or constipation, No heartburn or reflux  GU: No dysuria, frequency or urgency, or incontinence  MUSCULOSKELETAL: No unrelieved bone/joint pain NEUROLOGIC: No headache, dizziness  PSYCHIATRIC: No overt anxiety or sadness  Vitals:   07/27/18 1628  BP: 126/87  Pulse: 79  Resp: 18  Temp: 98.4 F (36.9 C)   Body  mass index is 20.83 kg/m. Physical Exam  GENERAL APPEARANCE: Alert,  No acute distress  SKIN: No diaphoresis rash HEENT: Unremarkable RESPIRATORY: Breathing is even, unlabored. Lung sounds are clear   CARDIOVASCULAR: Heart RRR no murmurs, rubs or gallops. No peripheral edema  GASTROINTESTINAL: Abdomen is soft, non-tender, not distended w/ normal bowel sounds.  GENITOURINARY: Bladder non tender, not distended  MUSCULOSKELETAL: No abnormal joints or musculature NEUROLOGIC: Cranial nerves 2-12 grossly intact. Moves all extremities PSYCHIATRIC: Mood and affect appropriate to situation, no behavioral issues  Patient Active Problem List   Diagnosis Date Noted  . Hypernatremia 05/26/2018  . HCAP (healthcare-associated pneumonia) 05/18/2018  . Acute renal failure (ARF) ([place]) 05/07/2018  . MRSA (methicillin resistant Staphylococcus aureus) septicemia ([place]) 02/23/2018  . Hypokalemia 02/23/2018  . Bile duct stone   . E. coli sepsis ([place]) 02/17/2018  . Cholecystitis 07/31/2017  . Cholecystitis, acute with  cholelithiasis 07/20/2017  . Pressure injury of skin 07/20/2017  . History of total right hip arthroplasty 01/05/2017  . Acute blood loss as cause of postoperative anemia 01/05/2017  . Escherichia coli urinary tract infection 01/05/2017  . Acute encephalopathy 01/05/2017  . Depression, recurrent ([place]) 01/05/2017  . Polyneuropathy 01/05/2017  . Closed right hip fracture ([place]) 12/25/2016  . Anxiety 11/03/2016  . Dysphagia 07/13/2016  . Anasarca 05/21/2016  . Obesity (BMI 30.0-34.9) 05/21/2016  . Closed TBI (traumatic brain injury) ([place]) 04/21/2016  . Status post insertion of percutaneous endoscopic gastrostomy (PEG) tube ([place]) 04/21/2016  . Facial laceration 04/21/2016  . Cheek wound, right, subsequent encounter 04/21/2016  . Urinary tract infection due to extended-spectrum beta lactamase (ESBL) producing Escherichia coli 04/21/2016  . Hypotension 04/21/2016  . Gout   . Hypertension      CMP     Component Value Date/Time   NA 145 06/08/2018   NA 145 07/28/2017   K 3.9 06/08/2018   K 3.7 07/28/2017   CL 112 (H) 02/21/2018 0325   CO2 25 02/21/2018 0325   GLUCOSE 87 02/21/2018 0325   BUN 25 (A) 06/08/2018   CREATININE 0.6 06/08/2018   CREATININE 0.42 (L) 02/21/2018 0325   CREATININE 0.39 07/28/2017   CALCIUM 7.6 (L) 02/21/2018 0325   CALCIUM 8.1 07/28/2017   PROT 4.8 (L) 02/21/2018 0325   ALBUMIN 1.9 (L) 02/21/2018 0325   AST 30 02/21/2018 0325   ALT 33 02/21/2018 0325   ALKPHOS 324 (H) 02/21/2018 0325   BILITOT 1.1 02/21/2018 0325   GFRNONAA >60 02/21/2018 0325   GFRNONAA >90 07/28/2017   GFRAA >60 02/21/2018 0325   Recent Labs    02/16/18 2227  02/19/18 0311 02/20/18 0325 02/21/18 0325  05/28/18 05/29/18 06/08/18  NA  --    < > 145 147* 146*   < > 142 143 145  K  --    < > 3.3* 3.7 3.0*   < > 3.9 3.8 3.9  CL  --    < > 115* 116* 112*  --   --   --   --   CO2  --    < > 24 24 25   --   --   --   --   GLUCOSE  --    < > 96 84 87  --   --   --   --   BUN  --    < > 12 10 8    < > 20 20 25*  CREATININE  --    < > 0.54 0.54 0.42*   < > 0.6 0.7 0.6  CALCIUM  --    < > 7.7* 7.9* 7.6*  --   --   --   --   MG 2.1  --   --   --   --   --   --   --   --    < > = values in this interval not displayed.   Recent Labs    02/19/18 0311 02/20/18 0325 02/21/18 0325  AST 71* 45* 30  ALT 56* 44 33  ALKPHOS 299* 358* 324*  BILITOT 2.2* 1.9* 1.1  PROT 5.0* 5.0* 4.8*  ALBUMIN 1.9* 2.1* 1.9*   Recent Labs    02/16/18 1915 02/17/18 0435 02/18/18 0308 02/19/18 0311 02/20/18 0325 02/21/18 0325  05/11/18 05/25/18 06/08/18  WBC 18.1* 19.0* 14.6* 9.9 10.8* 7.6   < > 7.2 9.4 9.1  NEUTROABS 16.8* 16.7* 13.2*  --   --   --   --   --   --   --  HGB 12.5 10.1* 12.1 10.3* 10.4* 10.1*   < > 10.3* 10.5* 11.7*  HCT 42.8 34.9* 40.8 35.6* 35.6* 34.1*   < > 32* 32* 36  MCV 89.5 90.6 87.9 89.7 90.4 89.0  --   --   --   --   PLT 265 193 124* 197 232 192   < > 260 226 250    < > = values in this interval not displayed.   No results for input(s): CHOL, LDLCALC, TRIG in the last 8760 hours.  Invalid input(s): HCL No results found for: MICROALBUR Lab Results  Component Value Date   TSH 1.68 08/06/2013   No results found for: HGBA1C No results found for: CHOL, HDL, LDLCALC, LDLDIRECT, TRIG, CHOLHDL  Significant Diagnostic Results in last 30 days:  No results found.  Assessment and Plan  Hypernatremia-patient is awake and drinking so we will institute the 120 cc of water every 2 hours 8 times a day while awake and repeat BMP daily through Friday.  We will start an IV if not successful with free water by mouth     [name], MD

## 2018-07-28 DIAGNOSIS — E870 Hyperosmolality and hypernatremia: Secondary | ICD-10-CM | POA: Diagnosis not present

## 2018-07-28 DIAGNOSIS — I10 Essential (primary) hypertension: Secondary | ICD-10-CM | POA: Diagnosis not present

## 2018-07-28 DIAGNOSIS — L89150 Pressure ulcer of sacral region, unstageable: Secondary | ICD-10-CM | POA: Diagnosis not present

## 2018-07-28 DIAGNOSIS — L89153 Pressure ulcer of sacral region, stage 3: Secondary | ICD-10-CM | POA: Diagnosis not present

## 2018-07-28 DIAGNOSIS — D649 Anemia, unspecified: Secondary | ICD-10-CM | POA: Diagnosis not present

## 2018-07-29 ENCOUNTER — Encounter: Payer: Self-pay | Admitting: Internal Medicine

## 2018-07-29 DIAGNOSIS — D649 Anemia, unspecified: Secondary | ICD-10-CM | POA: Diagnosis not present

## 2018-07-29 DIAGNOSIS — I10 Essential (primary) hypertension: Secondary | ICD-10-CM | POA: Diagnosis not present

## 2018-07-29 DIAGNOSIS — E870 Hyperosmolality and hypernatremia: Secondary | ICD-10-CM | POA: Diagnosis not present

## 2018-07-29 DIAGNOSIS — L89150 Pressure ulcer of sacral region, unstageable: Secondary | ICD-10-CM | POA: Diagnosis not present

## 2018-07-30 DIAGNOSIS — R509 Fever, unspecified: Secondary | ICD-10-CM | POA: Diagnosis not present

## 2018-07-30 DIAGNOSIS — Z79899 Other long term (current) drug therapy: Secondary | ICD-10-CM | POA: Diagnosis not present

## 2018-07-30 DIAGNOSIS — I10 Essential (primary) hypertension: Secondary | ICD-10-CM | POA: Diagnosis not present

## 2018-07-30 DIAGNOSIS — D649 Anemia, unspecified: Secondary | ICD-10-CM | POA: Diagnosis not present

## 2018-07-31 DIAGNOSIS — Z20828 Contact with and (suspected) exposure to other viral communicable diseases: Secondary | ICD-10-CM | POA: Diagnosis not present

## 2018-07-31 DIAGNOSIS — R509 Fever, unspecified: Secondary | ICD-10-CM | POA: Diagnosis not present

## 2018-07-31 DIAGNOSIS — D649 Anemia, unspecified: Secondary | ICD-10-CM | POA: Diagnosis not present

## 2018-07-31 DIAGNOSIS — Z79899 Other long term (current) drug therapy: Secondary | ICD-10-CM | POA: Diagnosis not present

## 2018-07-31 DIAGNOSIS — I10 Essential (primary) hypertension: Secondary | ICD-10-CM | POA: Diagnosis not present

## 2018-08-03 ENCOUNTER — Non-Acute Institutional Stay (SKILLED_NURSING_FACILITY): Payer: Medicare Other | Admitting: Internal Medicine

## 2018-08-03 ENCOUNTER — Encounter: Payer: Self-pay | Admitting: Internal Medicine

## 2018-08-03 DIAGNOSIS — I10 Essential (primary) hypertension: Secondary | ICD-10-CM | POA: Diagnosis not present

## 2018-08-03 DIAGNOSIS — D7219 Other eosinophilia: Secondary | ICD-10-CM

## 2018-08-03 DIAGNOSIS — D649 Anemia, unspecified: Secondary | ICD-10-CM | POA: Diagnosis not present

## 2018-08-03 DIAGNOSIS — D721 Eosinophilia: Secondary | ICD-10-CM

## 2018-08-03 DIAGNOSIS — L89153 Pressure ulcer of sacral region, stage 3: Secondary | ICD-10-CM | POA: Diagnosis not present

## 2018-08-03 NOTE — Progress Notes (Signed)
Location:  [facility] Room Number: 206-D Place of Service:  SNF (31)  [name], MD  [name], [name]  Extended Emergency Contact Information Primary Emergency Contact: [name] Address: [address]          [place], [place] [address] [name] of [place] Phone: [phone] Relation: Son Secondary Emergency Contact: [name],[name]          [place], [place] Relation: Sister    Allergies: Demerol [meperidine]  Chief Complaint  Patient presents with  . Acute Visit    Elevated WBC demonstrated on routine labs    HPI: Patient is an 83 y.o. female who is being seen because she had a white count of 12.6 on routine lab..  Patient denies cold or cough or fever chills nausea vomiting diarrhea.  Patient has no urinary symptoms.  It was reported that she did not eat well on Saturday but is eating okay today and is drinking.  Urine did look a little cloudy today.  Past Medical History:  Diagnosis Date  . Anasarca 05/21/2016  . Anxiety 11/03/2016  . Closed TBI (traumatic brain injury) ([place]) 04/21/2016  . Dysphagia 07/13/2016  . Gout   . Hypertension   . Obesity (BMI 30.0-34.9) 05/21/2016  . Status post insertion of percutaneous endoscopic gastrostomy (PEG) tube ([place]) 04/21/2016    Past Surgical History:  Procedure Laterality Date  . CHOLECYSTECTOMY N/A 07/21/2017   Procedure: LAPAROSCOPIC CHOLECYSTECTOMY;  Surgeon: Erroll Luna, MD;  Location: [facility];  Service: General;  Laterality: N/A;  . ERCP N/A 02/18/2018   Procedure: ENDOSCOPIC RETROGRADE CHOLANGIOPANCREATOGRAPHY (ERCP);  Surgeon: Milus Banister, MD;  Location: [facility];  Service: Endoscopy;  Laterality: N/A;  . INTRAMEDULLARY (IM) NAIL INTERTROCHANTERIC Right 12/27/2016   Procedure: INTRAMEDULLARY (IM) NAIL INTERTROCHANTRIC RIGHT HIP;  Surgeon: Paralee Cancel, MD;  Location: [facility];   Service: Orthopedics;  Laterality: Right;  . MASTECTOMY    . REMOVAL OF STONES  02/18/2018   Procedure: REMOVAL OF STONES;  Surgeon: Milus Banister, MD;  [facility];  Service: Endoscopy;;  balloon sweep  . SPHINCTEROTOMY  02/18/2018   Procedure: SPHINCTEROTOMY;  Surgeon: Milus Banister, MD;  Location: [facility];  Service: Endoscopy;;    Allergies as of 08/03/2018      Reactions   Demerol [meperidine] Swelling      Medication List       Accurate as of August 03, 2018  7:15 PM. If you have any questions, ask your nurse or doctor.        acetaminophen 325 MG tablet Commonly known as: TYLENOL Take 650 mg by mouth every 6 (six) hours as needed.   ALPRAZolam 0.5 MG tablet Commonly known as: XANAX Take 1 tablet (0.5 mg total) by mouth 2 (two) times daily.   bimatoprost 0.01 % Soln Commonly known as: LUMIGAN Place 1 drop into both eyes at bedtime. For glaucoma   bisacodyl 10 MG suppository Commonly known as: DULCOLAX Place 10 mg rectally daily as needed (constipation not relieved by MOM).   Ensure Take 237 mLs by mouth 3 (three) times daily.   nutrition supplement (JUVEN) Pack Take 1 packet by mouth 2 (two) times daily between meals. mix with 4-8oz water to aid in wound healing.   NUTRITIONAL SUPPLEMENT PO Take 1 each by mouth 3 (three) times daily with meals. Magic Cup with meals   escitalopram 5 MG  tablet Commonly known as: LEXAPRO Take 5 mg by mouth daily.   feeding supplement (PRO-STAT SUGAR FREE 64) Liqd Take 30 mLs by mouth 2 (two) times daily.   FLEET ENEMA RE Place 1 each rectally daily as needed (constipation not relieved by bisacodyl suppository).   gabapentin 600 MG tablet Commonly known as: NEURONTIN Take 600 mg by mouth 3 (three) times daily.   hydrochlorothiazide 25 MG tablet Commonly known as: HYDRODIURIL Take 12.5 mg by mouth daily. Take 0.5 tablet to = 12.5 mg   MILK OF MAGNESIA PO Take 30 mLs by mouth daily as needed (if no BM in  3 days).   mirtazapine 7.5 MG tablet Commonly known as: REMERON Take 7.5 mg by mouth. TAKE 1 TABLET BY MOUTH AT BEDTIME FOR APPETITE STIMULANT   MULTIPLE VITAMINS-MINERALS PO Take by mouth. TAKE 1 TABLET BY MOUTH ONCE DAILY TO PROMOTE WOUND HEALING   oxyCODONE-acetaminophen 5-325 MG tablet Commonly known as: PERCOCET/ROXICET Take 1 tablet by mouth 2 (two) times daily. Hold for somnolence   potassium chloride SA 20 MEQ tablet Commonly known as: K-DUR Take 20 mEq by mouth daily.   sennosides-docusate sodium 8.6-50 MG tablet Commonly known as: SENOKOT-S Take 2 tablets by mouth 2 (two) times daily as needed for constipation.   venlafaxine XR 75 MG 24 hr capsule Commonly known as: EFFEXOR-XR Take 75 mg by mouth 2 (two) times daily. FOR DEPRESSION (DO NOT CRUSH)   Vitamin D3 1.25 MG (50000 UT) Caps Take 1 capsule by mouth once a week.       No orders of the defined types were placed in this encounter.   Immunization History  Administered Date(s) Administered  . Influenza-Unspecified 12/19/2016, 11/26/2017  . PPD Test 04/29/2016  . Pneumococcal Conjugate-13 08/06/2016  . Pneumococcal Polysaccharide-23 04/15/2016  . Tdap 03/31/2016    Social History   Tobacco Use  . Smoking status: Never Smoker  . Smokeless tobacco: Never Used  Substance Use Topics  . Alcohol use: Not Currently    Review of Systems  DATA OBTAINED: from patient-limited; nursing- as per history present illness GENERAL:  no fevers, fatigue, appetite changes SKIN: No itching, rash HEENT: No complaint RESPIRATORY: No cough, wheezing, SOB CARDIAC: No chest pain, palpitations, lower extremity edema  GI: No abdominal pain, No N/V/D or constipation, No heartburn or reflux  GU: No dysuria, frequency or urgency, or incontinence  MUSCULOSKELETAL: No unrelieved bone/joint pain NEUROLOGIC: No headache, dizziness  PSYCHIATRIC: No overt anxiety or sadness  Vitals:   08/03/18 1908  BP: 130/74  Pulse: 80   Resp: 18  Temp: 98.8 F (37.1 C)   Body mass index is 20.37 kg/m. Physical Exam  GENERAL APPEARANCE: Alert, minimally conversant, No acute distress  SKIN: No diaphoresis rash HEENT: Unremarkable RESPIRATORY: Breathing is even, unlabored. Lung sounds are clear   CARDIOVASCULAR: Heart RRR no murmurs, rubs or gallops. No peripheral edema  GASTROINTESTINAL: Abdomen is soft, non-tender, not distended w/ normal bowel sounds.  GENITOURINARY: Bladder non tender, not distended  MUSCULOSKELETAL: No abnormal joints or musculature NEUROLOGIC: Cranial nerves 2-12 grossly intact. Moves all extremities PSYCHIATRIC: Mood and affect dementia, no behavioral issues  Patient Active Problem List   Diagnosis Date Noted  . Hypernatremia 05/26/2018  . HCAP (healthcare-associated pneumonia) 05/18/2018  . Acute renal failure (ARF) ([facility]) 05/07/2018  . MRSA (methicillin resistant Staphylococcus aureus) septicemia ([facility]) 02/23/2018  . Hypokalemia 02/23/2018  . Bile duct stone   . E. coli sepsis ([facility]) 02/17/2018  . Cholecystitis 07/31/2017  . Cholecystitis,  acute with cholelithiasis 07/20/2017  . Pressure injury of skin 07/20/2017  . History of total right hip arthroplasty 01/05/2017  . Acute blood loss as cause of postoperative anemia 01/05/2017  . Escherichia coli urinary tract infection 01/05/2017  . Acute encephalopathy 01/05/2017  . Depression, recurrent ([place]) 01/05/2017  . Polyneuropathy 01/05/2017  . Closed right hip fracture ([facility]) 12/25/2016  . Anxiety 11/03/2016  . Dysphagia 07/13/2016  . Anasarca 05/21/2016  . Obesity (BMI 30.0-34.9) 05/21/2016  . Closed TBI (traumatic brain injury) ([facility] [place]) 04/21/2016  . Status post insertion of percutaneous endoscopic gastrostomy (PEG) tube ([facility]) 04/21/2016  . Facial laceration 04/21/2016  . Cheek wound, right, subsequent encounter 04/21/2016  . Urinary tract infection due to extended-spectrum beta lactamase (ESBL) producing Escherichia coli 04/21/2016  .  Hypotension 04/21/2016  . Gout   . Hypertension     CMP     Component Value Date/Time   NA 145 06/08/2018   NA 145 07/28/2017   K 3.9 06/08/2018   K 3.7 07/28/2017   CL 112 (H) 02/21/2018 0325   CO2 25 02/21/2018 0325   GLUCOSE 87 02/21/2018 0325   BUN 25 (A) 06/08/2018   CREATININE 0.6 06/08/2018   CREATININE 0.42 (L) 02/21/2018 0325   CREATININE 0.39 07/28/2017   CALCIUM 7.6 (L) 02/21/2018 0325   CALCIUM 8.1 07/28/2017   PROT 4.8 (L) 02/21/2018 0325   ALBUMIN 1.9 (L) 02/21/2018 0325   AST 30 02/21/2018 0325   ALT 33 02/21/2018 0325   ALKPHOS 324 (H) 02/21/2018 0325   BILITOT 1.1 02/21/2018 0325   GFRNONAA >60 02/21/2018 0325   GFRNONAA >90 07/28/2017   GFRAA >60 02/21/2018 0325   Recent Labs    02/16/18 2227  02/19/18 0311 02/20/18 0325 02/21/18 0325  05/28/18 05/29/18 06/08/18  NA  --    < > 145 147* 146*   < > 142 143 145  K  --    < > 3.3* 3.7 3.0*   < > 3.9 3.8 3.9  CL  --    < > 115* 116* 112*  --   --   --   --   CO2  --    < > 24 24 25   --   --   --   --   GLUCOSE  --    < > 96 84 87  --   --   --   --   BUN  --    < > 12 10 8    < > 20 20 25*  CREATININE  --    < > 0.54 0.54 0.42*   < > 0.6 0.7 0.6  CALCIUM  --    < > 7.7* 7.9* 7.6*  --   --   --   --   MG 2.1  --   --   --   --   --   --   --   --    < > = values in this interval not displayed.   Recent Labs    02/19/18 0311 02/20/18 0325 02/21/18 0325  AST 71* 45* 30  ALT 56* 44 33  ALKPHOS 299* 358* 324*  BILITOT 2.2* 1.9* 1.1  PROT 5.0* 5.0* 4.8*  ALBUMIN 1.9* 2.1* 1.9*   Recent Labs    02/16/18 1915 02/17/18 0435 02/18/18 0308 02/19/18 0311 02/20/18 0325 02/21/18 0325  05/11/18 05/25/18 06/08/18  WBC 18.1* 19.0* 14.6* 9.9 10.8* 7.6   < > 7.2 9.4 9.1  NEUTROABS 16.8* 16.7* 13.2*  --   --   --   --   --   --   --  HGB 12.5 10.1* 12.1 10.3* 10.4* 10.1*   < > 10.3* 10.5* 11.7*  HCT 42.8 34.9* 40.8 35.6* 35.6* 34.1*   < > 32* 32* 36  MCV 89.5 90.6 87.9 89.7 90.4 89.0  --   --   --   --    PLT 265 193 124* 197 232 192   < > 260 226 250   < > = values in this interval not displayed.   No results for input(s): CHOL, LDLCALC, TRIG in the last 8760 hours.  Invalid input(s): HCL No results found for: MICROALBUR Lab Results  Component Value Date   TSH 1.68 08/06/2013   No results found for: HGBA1C No results found for: CHOL, HDL, LDLCALC, LDLDIRECT, TRIG, CHOLHDL  Significant Diagnostic Results in last 30 days:  No results found.  Assessment and Plan  Elevated white count- patient does not have any coughs chest pain fever or other symptom of pneumonia relieving COVID, nevertheless will be ordering a chest x-ray; urine was reported to be cloudy will be ordering UA with CNS; will continue to monitor patient    [name], MD

## 2018-08-04 ENCOUNTER — Other Ambulatory Visit: Payer: Self-pay

## 2018-08-04 ENCOUNTER — Non-Acute Institutional Stay (SKILLED_NURSING_FACILITY): Payer: Medicare Other | Admitting: Internal Medicine

## 2018-08-04 ENCOUNTER — Emergency Department (HOSPITAL_COMMUNITY)
Admission: EM | Admit: 2018-08-04 | Discharge: 2018-08-04 | Disposition: A | Discharge status: Home / Self Care | Payer: Medicare Other | Attending: Emergency Medicine | Admitting: Emergency Medicine

## 2018-08-04 ENCOUNTER — Encounter: Payer: Self-pay | Admitting: Internal Medicine

## 2018-08-04 ENCOUNTER — Emergency Department (HOSPITAL_COMMUNITY): Payer: Medicare Other

## 2018-08-04 ENCOUNTER — Encounter (HOSPITAL_COMMUNITY): Payer: Self-pay

## 2018-08-04 DIAGNOSIS — Z7401 Bed confinement status: Secondary | ICD-10-CM | POA: Diagnosis not present

## 2018-08-04 DIAGNOSIS — C801 Malignant (primary) neoplasm, unspecified: Secondary | ICD-10-CM | POA: Diagnosis not present

## 2018-08-04 DIAGNOSIS — J189 Pneumonia, unspecified organism: Secondary | ICD-10-CM | POA: Diagnosis not present

## 2018-08-04 DIAGNOSIS — T83511A Infection and inflammatory reaction due to indwelling urethral catheter, initial encounter: Secondary | ICD-10-CM | POA: Diagnosis not present

## 2018-08-04 DIAGNOSIS — Z79899 Other long term (current) drug therapy: Secondary | ICD-10-CM | POA: Insufficient documentation

## 2018-08-04 DIAGNOSIS — R404 Transient alteration of awareness: Secondary | ICD-10-CM | POA: Diagnosis not present

## 2018-08-04 DIAGNOSIS — D721 Eosinophilia: Secondary | ICD-10-CM

## 2018-08-04 DIAGNOSIS — I10 Essential (primary) hypertension: Secondary | ICD-10-CM | POA: Insufficient documentation

## 2018-08-04 DIAGNOSIS — M2550 Pain in unspecified joint: Secondary | ICD-10-CM | POA: Diagnosis not present

## 2018-08-04 DIAGNOSIS — N390 Urinary tract infection, site not specified: Secondary | ICD-10-CM | POA: Diagnosis not present

## 2018-08-04 DIAGNOSIS — C7801 Secondary malignant neoplasm of right lung: Secondary | ICD-10-CM | POA: Diagnosis not present

## 2018-08-04 DIAGNOSIS — R799 Abnormal finding of blood chemistry, unspecified: Secondary | ICD-10-CM | POA: Diagnosis present

## 2018-08-04 DIAGNOSIS — L8995 Pressure ulcer of unspecified site, unstageable: Secondary | ICD-10-CM | POA: Diagnosis not present

## 2018-08-04 DIAGNOSIS — R9389 Abnormal findings on diagnostic imaging of other specified body structures: Secondary | ICD-10-CM | POA: Diagnosis not present

## 2018-08-04 DIAGNOSIS — R319 Hematuria, unspecified: Secondary | ICD-10-CM | POA: Diagnosis not present

## 2018-08-04 DIAGNOSIS — R4182 Altered mental status, unspecified: Secondary | ICD-10-CM | POA: Diagnosis not present

## 2018-08-04 DIAGNOSIS — R531 Weakness: Secondary | ICD-10-CM | POA: Diagnosis not present

## 2018-08-04 DIAGNOSIS — Z87820 Personal history of traumatic brain injury: Secondary | ICD-10-CM | POA: Diagnosis not present

## 2018-08-04 DIAGNOSIS — C7800 Secondary malignant neoplasm of unspecified lung: Secondary | ICD-10-CM | POA: Diagnosis not present

## 2018-08-04 DIAGNOSIS — D72828 Other elevated white blood cell count: Secondary | ICD-10-CM | POA: Diagnosis not present

## 2018-08-04 DIAGNOSIS — D7219 Other eosinophilia: Secondary | ICD-10-CM

## 2018-08-04 DIAGNOSIS — R918 Other nonspecific abnormal finding of lung field: Secondary | ICD-10-CM

## 2018-08-04 DIAGNOSIS — Y732 Prosthetic and other implants, materials and accessory gastroenterology and urology devices associated with adverse incidents: Secondary | ICD-10-CM | POA: Diagnosis not present

## 2018-08-04 DIAGNOSIS — R911 Solitary pulmonary nodule: Secondary | ICD-10-CM | POA: Diagnosis not present

## 2018-08-04 DIAGNOSIS — T83098A Other mechanical complication of other indwelling urethral catheter, initial encounter: Secondary | ICD-10-CM | POA: Diagnosis not present

## 2018-08-04 DIAGNOSIS — R05 Cough: Secondary | ICD-10-CM | POA: Diagnosis not present

## 2018-08-04 LAB — CBC WITH DIFFERENTIAL/PLATELET
Abs Immature Granulocytes: 0.05 10*3/uL (ref 0.00–0.07)
Basophils Absolute: 0.1 10*3/uL (ref 0.0–0.1)
Basophils Relative: 1 %
Eosinophils Absolute: 0.2 10*3/uL (ref 0.0–0.5)
Eosinophils Relative: 2 %
HCT: 40.3 % (ref 36.0–46.0)
Hemoglobin: 11.8 g/dL — ABNORMAL LOW (ref 12.0–15.0)
Immature Granulocytes: 0 %
Lymphocytes Relative: 34 %
Lymphs Abs: 3.8 10*3/uL (ref 0.7–4.0)
MCH: 25.9 pg — ABNORMAL LOW (ref 26.0–34.0)
MCHC: 29.3 g/dL — ABNORMAL LOW (ref 30.0–36.0)
MCV: 88.6 fL (ref 80.0–100.0)
Monocytes Absolute: 0.6 10*3/uL (ref 0.1–1.0)
Monocytes Relative: 5 %
Neutro Abs: 6.5 10*3/uL (ref 1.7–7.7)
Neutrophils Relative %: 58 %
Platelets: 293 10*3/uL (ref 150–400)
RBC: 4.55 MIL/uL (ref 3.87–5.11)
RDW: 16.7 % — ABNORMAL HIGH (ref 11.5–15.5)
WBC: 11.2 10*3/uL — ABNORMAL HIGH (ref 4.0–10.5)
nRBC: 0 % (ref 0.0–0.2)

## 2018-08-04 LAB — URINALYSIS, ROUTINE W REFLEX MICROSCOPIC
Bilirubin Urine: NEGATIVE
Glucose, UA: NEGATIVE mg/dL
Hgb urine dipstick: NEGATIVE
Ketones, ur: NEGATIVE mg/dL
Nitrite: POSITIVE — AB
Protein, ur: NEGATIVE mg/dL
Specific Gravity, Urine: 1.02 (ref 1.005–1.030)
WBC, UA: >50 WBC/hpf — ABNORMAL HIGH (ref 0–5)
pH: 5 (ref 5.0–8.0)

## 2018-08-04 LAB — COMPREHENSIVE METABOLIC PANEL
ALT: 12 U/L (ref 0–44)
AST: 17 U/L (ref 15–41)
Albumin: 2.9 g/dL — ABNORMAL LOW (ref 3.5–5.0)
Alkaline Phosphatase: 82 U/L (ref 38–126)
Anion gap: 9 (ref 5–15)
BUN: 20 mg/dL (ref 8–23)
CO2: 30 mmol/L (ref 22–32)
Calcium: 8.9 mg/dL (ref 8.9–10.3)
Chloride: 102 mmol/L (ref 98–111)
Creatinine, Ser: 0.69 mg/dL (ref 0.44–1.00)
GFR calc Af Amer: >60 mL/min (ref >60)
GFR calc non Af Amer: >60 mL/min (ref >60)
Glucose, Bld: 97 mg/dL (ref 70–99)
Potassium: 3.4 mmol/L — ABNORMAL LOW (ref 3.5–5.1)
Sodium: 141 mmol/L (ref 135–145)
Total Bilirubin: 0.7 mg/dL (ref 0.3–1.2)
Total Protein: 7.2 g/dL (ref 6.5–8.1)

## 2018-08-04 MED ORDER — SODIUM CHLORIDE 0.9 % IV SOLN
1.0000 g | Freq: Once | INTRAVENOUS | Status: AC
  Administered 2018-08-04: 18:00:00 1 g via INTRAVENOUS
  Filled 2018-08-04: qty 10

## 2018-08-04 NOTE — ED Triage Notes (Signed)
Pt BIB EMS [facility]. Per facility pt has high WBC and scans done may indicate pneumonia. Pt has pressure ulcer on bottom that facility unhooked wound vac from for transport with EMS. Pt has urinary catheter. Pt is lethargic and confused, per facility this is baseline.   97.5 temp 98% RA CBG 110 BP 116/78

## 2018-08-04 NOTE — ED Notes (Signed)
[name] called  

## 2018-08-04 NOTE — ED Provider Notes (Signed)
[facility] DEPT Provider Note   CSN: [id number] Arrival date & time: 08/04/18  1333    History   Chief Complaint Chief Complaint  Patient presents with   Abnormal Lab    HPI Autumn Bryant is a 83 y.o. female.     HPI Patient presents with report from facility of concern for abnormal labs possible pneumonia. Patient self is listless, in per report via EMS is at baseline. Level 5 caveat secondary to patient's mental status. Reportedly the patient had labs performed at nursing facility for was found no leukocytosis. No report of fever, no report of vomiting.  Line patient has known deep decubitus ulcer with wound VAC, and has long-term Foley catheter placement. No reported change in output from either of these devices.  Past Medical History:  Diagnosis Date   Anasarca 05/21/2016   Anxiety 11/03/2016   Closed TBI (traumatic brain injury) ([place] [facility]) 04/21/2016   Dysphagia 07/13/2016   Gout    Hypertension    Obesity (BMI 30.0-34.9) 05/21/2016   Status post insertion of percutaneous endoscopic gastrostomy (PEG) tube ([place]) 04/21/2016    Patient Active Problem List   Diagnosis Date Noted   Hypernatremia 05/26/2018   HCAP (healthcare-associated pneumonia) 05/18/2018   Acute renal failure (ARF) ([place]) 05/07/2018   MRSA (methicillin resistant Staphylococcus aureus) septicemia ([place]) 02/23/2018   Hypokalemia 02/23/2018   Bile duct stone    E. coli sepsis ([place]) 02/17/2018   Cholecystitis 07/31/2017   Cholecystitis, acute with cholelithiasis 07/20/2017   Pressure injury of skin 07/20/2017   History of total right hip arthroplasty 01/05/2017   Acute blood loss as cause of postoperative anemia 01/05/2017   Escherichia coli urinary tract infection 01/05/2017   Acute encephalopathy 01/05/2017   Depression, recurrent ([facility]) 01/05/2017   Polyneuropathy 01/05/2017   Closed right hip fracture ([facility]) 12/25/2016   Anxiety 11/03/2016    Dysphagia 07/13/2016   Anasarca 05/21/2016   Obesity (BMI 30.0-34.9) 05/21/2016   Closed TBI (traumatic brain injury) ([place]) 04/21/2016   Status post insertion of percutaneous endoscopic gastrostomy (PEG) tube ([facility]) 04/21/2016   Facial laceration 04/21/2016   Cheek wound, right, subsequent encounter 04/21/2016   Urinary tract infection due to extended-spectrum beta lactamase (ESBL) producing Escherichia coli 04/21/2016   Hypotension 04/21/2016   Gout    Hypertension     Past Surgical History:  Procedure Laterality Date   CHOLECYSTECTOMY N/A 07/21/2017   Procedure: LAPAROSCOPIC CHOLECYSTECTOMY;  Surgeon: Erroll Luna, MD;  Location: [facility];  Service: General;  Laterality: N/A;   ERCP N/A 02/18/2018   Procedure: ENDOSCOPIC RETROGRADE CHOLANGIOPANCREATOGRAPHY (ERCP);  Surgeon: Milus Banister, MD;  Location: [facility];  Service: Endoscopy;  Laterality: N/A;   INTRAMEDULLARY (IM) NAIL INTERTROCHANTERIC Right 12/27/2016   Procedure: INTRAMEDULLARY (IM) NAIL INTERTROCHANTRIC RIGHT HIP;  Surgeon: Paralee Cancel, MD;  [facility];  Service: Orthopedics;  Laterality: Right;   MASTECTOMY     REMOVAL OF STONES  02/18/2018   Procedure: REMOVAL OF STONES;  Surgeon: Milus Banister, MD;  [facility];  Service: Endoscopy;;  balloon sweep   SPHINCTEROTOMY  02/18/2018   Procedure: Joan Mayans;  Surgeon: Milus Banister, MD;  [facility];  Service: Endoscopy;;     OB History   No obstetric history on file.      Home Medications    Prior to Admission medications   Medication Sig Start Date End Date Taking? Authorizing Provider  ALPRAZolam Duanne Moron) 0.5 MG tablet Take 1 tablet (0.5 mg total) by mouth  2 (two) times daily. 06/15/18  Yes [name], MD  Amino Acids-Protein Hydrolys (FEEDING SUPPLEMENT, PRO-STAT SUGAR FREE 64,) LIQD Take 30 mLs by mouth 2 (two) times daily.    Yes [provider]  Cholecalciferol (VITAMIN D3) 1.25 MG (50000  UT) CAPS Take 1 capsule by mouth once a week. 07/23/18 11/12/18 Yes [provider]  escitalopram (LEXAPRO) 5 MG tablet Take 5 mg by mouth daily.   Yes [provider]  gabapentin (NEURONTIN) 600 MG tablet Take 600 mg by mouth 3 (three) times daily.   Yes [provider]  mirtazapine (REMERON) 7.5 MG tablet Take 7.5 mg by mouth at bedtime.    Yes [provider]  MULTIPLE VITAMINS-MINERALS PO Take by mouth. TAKE 1 TABLET BY MOUTH ONCE DAILY TO PROMOTE WOUND HEALING   Yes [provider]  oxyCODONE-acetaminophen (PERCOCET/ROXICET) 5-325 MG tablet Take 1 tablet by mouth 2 (two) times daily. Hold for somnolence 07/17/18  Yes [name], MD  potassium chloride SA (K-DUR,KLOR-CON) 20 MEQ tablet Take 20 mEq by mouth daily.    Yes [provider]  venlafaxine XR (EFFEXOR-XR) 75 MG 24 hr capsule Take 75 mg by mouth 2 (two) times daily. FOR DEPRESSION (DO NOT CRUSH)   Yes [provider]  acetaminophen (TYLENOL) 325 MG tablet Take 650 mg by mouth every 6 (six) hours as needed.     [provider]  bimatoprost (LUMIGAN) 0.01 % SOLN Place 1 drop into both eyes at bedtime. For glaucoma    [provider]  bisacodyl (DULCOLAX) 10 MG suppository Place 10 mg rectally daily as needed (constipation not relieved by MOM).    [provider]  ENSURE (ENSURE) Take 237 mLs by mouth 3 (three) times daily.     [provider]  hydrochlorothiazide (HYDRODIURIL) 25 MG tablet Take 12.5 mg by mouth daily. Take 0.5 tablet to = 12.5 mg    [provider]  Magnesium Hydroxide (MILK OF MAGNESIA PO) Take 30 mLs by mouth daily as needed (if no BM in 3 days).    [provider]  nutrition supplement, JUVEN, (JUVEN) PACK Take 1 packet by mouth 2 (two) times daily between meals. mix with 4-8oz water to aid in wound healing.    [provider]  Nutritional Supplements (NUTRITIONAL SUPPLEMENT PO) Take 1 each by  mouth 3 (three) times daily with meals. Magic Cup with meals    [provider]  sennosides-docusate sodium (SENOKOT-S) 8.6-50 MG tablet Take 2 tablets by mouth 2 (two) times daily as needed for constipation.    [provider]  Sodium Phosphates (FLEET ENEMA RE) Place 1 each rectally daily as needed (constipation not relieved by bisacodyl suppository).    [provider]    Family History Family History  Problem Relation Age of Onset   Asthma Mother    Anesthesia problems Neg Hx    Arthritis Neg Hx    Cancer Neg Hx    Cerebral palsy Neg Hx    Clotting disorder Neg Hx    Club foot Neg Hx    Collagen disease Neg Hx    Deep vein thrombosis Neg Hx    Gait disorder Neg Hx    Heart disease Neg Hx    Gout Neg Hx    Diabetes Neg Hx    Hip dysplasia Neg Hx    Hip fracture Neg Hx    Hypermobility Neg Hx    Hypertension Neg Hx    Osteoporosis  Neg Hx    Other Neg Hx    Stroke Neg Hx    Thyroid disease Neg Hx    Vasculitis Neg Hx    Spina bifida Neg Hx    Scoliosis Neg Hx    Rheumatologic disease Neg Hx    Pulmonary embolism Neg Hx     Social History Social History   Tobacco Use   Smoking status: Never Smoker   Smokeless tobacco: Never Used  Substance Use Topics   Alcohol use: Not Currently   Drug use: No     Allergies   Demerol [meperidine]   Review of Systems Review of Systems  Unable to perform ROS: Patient nonverbal     Physical Exam Updated Vital Signs BP 102/64    Pulse 73    Resp 16    LMP  (LMP Unknown)    SpO2 97%   Physical Exam Vitals signs and nursing note reviewed.  Constitutional:      General: She is not in acute distress.    Appearance: She is ill-appearing.     Comments: Ill-appearing elderly female who responds only to painful sternum rub  HENT:     Head: Normocephalic and atraumatic.  Eyes:     Conjunctiva/sclera: Conjunctivae normal.  Cardiovascular:     Rate and Rhythm: Normal  rate and regular rhythm.  Pulmonary:     Effort: Pulmonary effort is normal. No respiratory distress.     Breath sounds: Normal breath sounds. No stridor.  Abdominal:     General: There is no distension.     Tenderness: There is no guarding.  Skin:    General: Skin is warm and dry.     Comments: Decubitus ulcer with wound VAC in place  Neurological:     Motor: Atrophy present.     Comments: Does not follow commands reliably, does retract to painful stimuli  Psychiatric:        Cognition and Memory: Cognition is impaired.      ED Treatments / Results  Labs (all labs ordered are listed, but only abnormal results are displayed) Labs Reviewed  COMPREHENSIVE METABOLIC PANEL - Abnormal; Notable for the following components:      Result Value   Potassium 3.4 (*)    Albumin 2.9 (*)    All other components within normal limits  CBC WITH DIFFERENTIAL/PLATELET - Abnormal; Notable for the following components:   WBC 11.2 (*)    Hemoglobin 11.8 (*)    MCH 25.9 (*)    MCHC 29.3 (*)    RDW 16.7 (*)    All other components within normal limits  URINALYSIS, ROUTINE W REFLEX MICROSCOPIC - Abnormal; Notable for the following components:   APPearance CLOUDY (*)    Nitrite POSITIVE (*)    Leukocytes,Ua LARGE (*)    WBC, UA >50 (*)    Bacteria, UA MANY (*)    Non Squamous Epithelial 0-5 (*)    All other components within normal limits    EKG   Radiology Ct Chest Wo Contrast  Result Date: 08/04/2018 CLINICAL DATA:  Confusion and lethargy.  Abnormal chest x-ray. EXAM: CT CHEST WITHOUT CONTRAST TECHNIQUE: Multidetector CT imaging of the chest was performed following the standard protocol without IV contrast. COMPARISON:  Chest x-ray 08/04/2018 and more remote chest x-ray from 02/16/2018. FINDINGS: Cardiovascular: The heart is normal in size. No pericardial effusion. Mild tortuosity and ectasia of the thoracic aorta with scattered atherosclerotic calcifications mainly at the aortic arch. No  significant coronary artery  calcifications. Mediastinum/Nodes: Small scattered mediastinal and hilar lymph nodes but no mass or overt adenopathy. The esophagus is grossly normal. Lungs/Pleura: Innumerable bilateral pulmonary nodules of varying sizes consistent with diffuse pulmonary metastatic disease. This is significantly progressive when compared to a prior abdominal CT scan from January 2020. 9.5 mm right upper lobe nodule on image number 59. 9 mm left upper lobe nodule on image number 57. 13.5 mm right middle lobe pulmonary nodule on image number 79. 16.5 mm right lower lobe nodule on image number 70. No infiltrates or effusions. Upper Abdomen: No significant upper abdominal findings. Musculoskeletal: Bilateral mastectomies. No chest wall mass, supraclavicular or axillary adenopathy. The thyroid gland is grossly normal. Osteoporosis and degenerative changes. Remote appearing lower thoracic compression fracture. No obvious bone lesions. Remote right humeral head and neck fractures. IMPRESSION: 1. Diffuse pulmonary metastatic disease of uncertain primary. 2. No mediastinal or hilar mass or adenopathy. 3. No upper abdominal or osseous metastatic disease is identified. Aortic Atherosclerosis (ICD10-I70.0). Electronically Signed   By: Marijo Sanes M.D.   On: 08/04/2018 17:13   Dg Chest Port 1 View  Result Date: 08/04/2018 CLINICAL DATA:  Evaluate for pneumonia. Elevated white blood cell count. Pna? EXAM: PORTABLE CHEST 1 VIEW COMPARISON:  02/16/2018. FINDINGS: Mediastinum and hilar structures normal. Heart size normal. New appearance of multiple pulmonary nodule densities noted bilaterally. Findings are concerning for metastatic disease. Infectious etiologies are pulmonary nodules cannot be excluded. No pleural effusion or pneumothorax. Surgical clips right axilla. Deformity right humerus again noted without interim change. Diffuse osteopenia. Surgical clips right upper quadrant. IMPRESSION: New appearance of  multiple bilateral pulmonary nodules, most worrisome for metastatic disease. Infectious etiologies including granulomatous disease cannot be excluded. Electronically Signed   By: Marcello Moores  Register   On: 08/04/2018 14:42    Procedures Procedures (including critical care time)  Medications Ordered in ED Medications  cefTRIAXone (ROCEPHIN) 1 g in sodium chloride 0.9 % 100 mL IVPB (1 g Intravenous New Bag/Given 08/04/18 1811)     Initial Impression / Assessment and Plan / ED Course  I have reviewed the triage vital signs and the nursing notes.  Pertinent labs & imaging results that were available during my care of the patient were reviewed by me and considered in my medical decision making (see chart for details).       6:40 PM  Had lengthy conversations with the patient's son and her physician.  Given the totality of 3 CT findings, her chronic illness, including indwelling catheter, wound VAC, patient unlikely to be a candidate for chemotherapy, and I discussed options for comfort care with the son, and physician. Both are agreeable to that course of action, and given no hemodynamic instability, patient will return to her nursing facility.  We have also discussed this with nursing facility staff.    Final Clinical Impressions(s) / ED Diagnoses   Final diagnoses:  Pulmonary nodules  Urinary tract infection associated with indwelling urethral catheter, initial encounter [facility])      Carmin Muskrat, MD 08/04/18 [id number]

## 2018-08-04 NOTE — Discharge Instructions (Addendum)
Please be sure to follow-up with your physician for further discussion of comfort care measures and your new pulmonary nodules.

## 2018-08-04 NOTE — Progress Notes (Signed)
Location:  [facility] Room Number: 206-D Place of Service:  SNF (31)  [name], MD  [name], [name]  Extended Emergency Contact Information Primary Emergency Contact: [name] Address: [address]          [place], [place] [address] [name] of [place] Phone: [phone] Relation: Son Secondary Emergency Contact: [name],[name]          [place], [place] Relation: Sister    Allergies: Demerol [meperidine]  Chief Complaint  Patient presents with  . Acute Visit    The patient has pneumonia.    HPI: Patient is an 83 y.o. female who is being seen today because her chest x-ray returned that was ordered yesterday because patient had had a white count of 12,6 on a routine lab.  Patient's chest x-ray is abnormal.  Patient denies any coughs or colds and as yesterday no fever chills nausea vomiting.  Patient has lost weight despite a very good appetite in the last several months.  Past Medical History:  Diagnosis Date  . Anasarca 05/21/2016  . Anxiety 11/03/2016  . Closed TBI (traumatic brain injury) ([facility]) 04/21/2016  . Dysphagia 07/13/2016  . Gout   . Hypertension   . Obesity (BMI 30.0-34.9) 05/21/2016  . Status post insertion of percutaneous endoscopic gastrostomy (PEG) tube ([facility]) 04/21/2016    Past Surgical History:  Procedure Laterality Date  . CHOLECYSTECTOMY N/A 07/21/2017   Procedure: LAPAROSCOPIC CHOLECYSTECTOMY;  Surgeon: Erroll Luna, MD;  Location: [facility];  Service: General;  Laterality: N/A;  . ERCP N/A 02/18/2018   Procedure: ENDOSCOPIC RETROGRADE CHOLANGIOPANCREATOGRAPHY (ERCP);  Surgeon: Milus Banister, MD;  Location: [facility];  Service: Endoscopy;  Laterality: N/A;  . INTRAMEDULLARY (IM) NAIL INTERTROCHANTERIC Right 12/27/2016   Procedure: INTRAMEDULLARY (IM) NAIL INTERTROCHANTRIC RIGHT HIP;  Surgeon: Paralee Cancel, MD;   Location: [facility];  Service: Orthopedics;  Laterality: Right;  . MASTECTOMY    . REMOVAL OF STONES  02/18/2018   Procedure: REMOVAL OF STONES;  Surgeon: Milus Banister, MD;  Location: [facility];  Service: Endoscopy;;  balloon sweep  . SPHINCTEROTOMY  02/18/2018   Procedure: SPHINCTEROTOMY;  Surgeon: Milus Banister, MD;  Location: [facility];  Service: Endoscopy;;    Allergies as of 08/04/2018      Reactions   Demerol [meperidine] Swelling      Medication List       Accurate as of August 04, 2018 12:33 PM. If you have any questions, ask your nurse or doctor.        acetaminophen 325 MG tablet Commonly known as: TYLENOL Take 650 mg by mouth every 6 (six) hours as needed.   ALPRAZolam 0.5 MG tablet Commonly known as: XANAX Take 1 tablet (0.5 mg total) by mouth 2 (two) times daily.   bimatoprost 0.01 % Soln Commonly known as: LUMIGAN Place 1 drop into both eyes at bedtime. For glaucoma   bisacodyl 10 MG suppository Commonly known as: DULCOLAX Place 10 mg rectally daily as needed (constipation not relieved by MOM).   Ensure Take 237 mLs by mouth 3 (three) times daily.   nutrition supplement (JUVEN) Pack Take 1 packet by mouth 2 (two) times daily between meals. mix with 4-8oz water to aid in wound healing.   NUTRITIONAL SUPPLEMENT PO Take 1 each by mouth 3 (three) times daily with meals. Magic Cup with meals   escitalopram 5 MG tablet  Commonly known as: LEXAPRO Take 5 mg by mouth daily.   feeding supplement (PRO-STAT SUGAR FREE 64) Liqd Take 30 mLs by mouth 2 (two) times daily.   FLEET ENEMA RE Place 1 each rectally daily as needed (constipation not relieved by bisacodyl suppository).   gabapentin 600 MG tablet Commonly known as: NEURONTIN Take 600 mg by mouth 3 (three) times daily.   hydrochlorothiazide 25 MG tablet Commonly known as: HYDRODIURIL Take 12.5 mg by mouth daily. Take 0.5 tablet to = 12.5 mg   MILK OF MAGNESIA PO Take 30 mLs by mouth daily as  needed (if no BM in 3 days).   mirtazapine 7.5 MG tablet Commonly known as: REMERON Take 7.5 mg by mouth. TAKE 1 TABLET BY MOUTH AT BEDTIME FOR APPETITE STIMULANT   MULTIPLE VITAMINS-MINERALS PO Take by mouth. TAKE 1 TABLET BY MOUTH ONCE DAILY TO PROMOTE WOUND HEALING   oxyCODONE-acetaminophen 5-325 MG tablet Commonly known as: PERCOCET/ROXICET Take 1 tablet by mouth 2 (two) times daily. Hold for somnolence   potassium chloride SA 20 MEQ tablet Commonly known as: K-DUR Take 20 mEq by mouth daily.   sennosides-docusate sodium 8.6-50 MG tablet Commonly known as: SENOKOT-S Take 2 tablets by mouth 2 (two) times daily as needed for constipation.   venlafaxine XR 75 MG 24 hr capsule Commonly known as: EFFEXOR-XR Take 75 mg by mouth 2 (two) times daily. FOR DEPRESSION (DO NOT CRUSH)   Vitamin D3 1.25 MG (50000 UT) Caps Take 1 capsule by mouth once a week.       No orders of the defined types were placed in this encounter.   Immunization History  Administered Date(s) Administered  . Influenza-Unspecified 12/19/2016, 11/26/2017  . PPD Test 04/29/2016  . Pneumococcal Conjugate-13 08/06/2016  . Pneumococcal Polysaccharide-23 04/15/2016  . Tdap 03/31/2016    Social History   Tobacco Use  . Smoking status: Never Smoker  . Smokeless tobacco: Never Used  Substance Use Topics  . Alcohol use: Not Currently    Review of Systems  DATA OBTAINED: from patient-limited; nursing- as per history present illness GENERAL:  no fevers, fatigue, appetite changes SKIN: No itching, rash HEENT: No complaint RESPIRATORY: No cough, wheezing, SOB CARDIAC: No chest pain, palpitations, lower extremity edema  GI: No abdominal pain, No N/V/D or constipation, No heartburn or reflux  GU: No dysuria, frequency or urgency, or incontinence  MUSCULOSKELETAL: No unrelieved bone/joint pain NEUROLOGIC: No headache, dizziness  PSYCHIATRIC: No overt anxiety or sadness  Vitals:   08/04/18 1221  BP:  130/74  Pulse: 80  Resp: 18  Temp: 98.8 F (37.1 C)   Body mass index is 20.37 kg/m. Physical Exam  GENERAL APPEARANCE: Alert, minimally conversant, No acute distress  SKIN: No diaphoresis rash HEENT: Unremarkable RESPIRATORY: Breathing is even, unlabored. Lung sounds are clear   CARDIOVASCULAR: Heart RRR no murmurs, rubs or gallops. No peripheral edema  GASTROINTESTINAL: Abdomen is soft, non-tender, not distended w/ normal bowel sounds.  GENITOURINARY: Bladder non tender, not distended  MUSCULOSKELETAL: No abnormal joints or musculature NEUROLOGIC: Cranial nerves 2-12 grossly intact. Moves all extremities PSYCHIATRIC: Mood and affect with dementia, no behavioral issues  Patient Active Problem List   Diagnosis Date Noted  . Hypernatremia 05/26/2018  . HCAP (healthcare-associated pneumonia) 05/18/2018  . Acute renal failure (ARF) ([facility]) 05/07/2018  . MRSA (methicillin resistant Staphylococcus aureus) septicemia ([facility]) 02/23/2018  . Hypokalemia 02/23/2018  . Bile duct stone   . E. coli sepsis ([facility]) 02/17/2018  . Cholecystitis 07/31/2017  . Cholecystitis,  acute with cholelithiasis 07/20/2017  . Pressure injury of skin 07/20/2017  . History of total right hip arthroplasty 01/05/2017  . Acute blood loss as cause of postoperative anemia 01/05/2017  . Escherichia coli urinary tract infection 01/05/2017  . Acute encephalopathy 01/05/2017  . Depression, recurrent ([facility]) 01/05/2017  . Polyneuropathy 01/05/2017  . Closed right hip fracture ([facility]) 12/25/2016  . Anxiety 11/03/2016  . Dysphagia 07/13/2016  . Anasarca 05/21/2016  . Obesity (BMI 30.0-34.9) 05/21/2016  . Closed TBI (traumatic brain injury) ([facility]) 04/21/2016  . Status post insertion of percutaneous endoscopic gastrostomy (PEG) tube ([facility]) 04/21/2016  . Facial laceration 04/21/2016  . Cheek wound, right, subsequent encounter 04/21/2016  . Urinary tract infection due to extended-spectrum beta lactamase (ESBL) producing  Escherichia coli 04/21/2016  . Hypotension 04/21/2016  . Gout   . Hypertension     CMP     Component Value Date/Time   NA 145 06/08/2018   NA 145 07/28/2017   K 3.9 06/08/2018   K 3.7 07/28/2017   CL 112 (H) 02/21/2018 0325   CO2 25 02/21/2018 0325   GLUCOSE 87 02/21/2018 0325   BUN 25 (A) 06/08/2018   CREATININE 0.6 06/08/2018   CREATININE 0.42 (L) 02/21/2018 0325   CREATININE 0.39 07/28/2017   CALCIUM 7.6 (L) 02/21/2018 0325   CALCIUM 8.1 07/28/2017   PROT 4.8 (L) 02/21/2018 0325   ALBUMIN 1.9 (L) 02/21/2018 0325   AST 30 02/21/2018 0325   ALT 33 02/21/2018 0325   ALKPHOS 324 (H) 02/21/2018 0325   BILITOT 1.1 02/21/2018 0325   GFRNONAA >60 02/21/2018 0325   GFRNONAA >90 07/28/2017   GFRAA >60 02/21/2018 0325   Recent Labs    02/16/18 2227  02/19/18 0311 02/20/18 0325 02/21/18 0325  05/28/18 05/29/18 06/08/18  NA  --    < > 145 147* 146*   < > 142 143 145  K  --    < > 3.3* 3.7 3.0*   < > 3.9 3.8 3.9  CL  --    < > 115* 116* 112*  --   --   --   --   CO2  --    < > 24 24 25   --   --   --   --   GLUCOSE  --    < > 96 84 87  --   --   --   --   BUN  --    < > 12 10 8    < > 20 20 25*  CREATININE  --    < > 0.54 0.54 0.42*   < > 0.6 0.7 0.6  CALCIUM  --    < > 7.7* 7.9* 7.6*  --   --   --   --   MG 2.1  --   --   --   --   --   --   --   --    < > = values in this interval not displayed.   Recent Labs    02/19/18 0311 02/20/18 0325 02/21/18 0325  AST 71* 45* 30  ALT 56* 44 33  ALKPHOS 299* 358* 324*  BILITOT 2.2* 1.9* 1.1  PROT 5.0* 5.0* 4.8*  ALBUMIN 1.9* 2.1* 1.9*   Recent Labs    02/16/18 1915 02/17/18 0435 02/18/18 0308 02/19/18 0311 02/20/18 0325 02/21/18 0325  05/11/18 05/25/18 06/08/18  WBC 18.1* 19.0* 14.6* 9.9 10.8* 7.6   < > 7.2 9.4 9.1  NEUTROABS 16.8* 16.7* 13.2*  --   --   --   --   --   --   --  HGB 12.5 10.1* 12.1 10.3* 10.4* 10.1*   < > 10.3* 10.5* 11.7*  HCT 42.8 34.9* 40.8 35.6* 35.6* 34.1*   < > 32* 32* 36  MCV 89.5 90.6 87.9  89.7 90.4 89.0  --   --   --   --   PLT 265 193 124* 197 232 192   < > 260 226 250   < > = values in this interval not displayed.   No results for input(s): CHOL, LDLCALC, TRIG in the last 8760 hours.  Invalid input(s): HCL No results found for: MICROALBUR Lab Results  Component Value Date   TSH 1.68 08/06/2013   No results found for: HGBA1C No results found for: CHOL, HDL, LDLCALC, LDLDIRECT, TRIG, CHOLHDL  Significant Diagnostic Results in last 30 days:  No results found.  Assessment and Plan  Abnormal white count/abnormal chest x-ray/cancer metastatic to lung of unknown cell type- chest x-ray shows right and left perihilar ill-defined multiple radiodensities; CT chest recommended O2 sat is 93 to 95% on room air; because of patient's mental status there is really nowhere else to send her for a better chest x-ray or a CT of the chest except for the ED; I have concern because bilateral ill-defined densities could possibly be COVID pneumonia patient sent to ED  Late entry- patient is back from the emergency department where she had a chest x-ray which showed multiple nodules bilaterally and a CT chest which showed multiple nodules bilaterally, 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 Vanita Panda on the phone who was speaking to patient's son.  He is unhappy with excepting the situation but he realizes that the only thing left to do is to give her comfort care; therefore patient is being returned to [facility] for care; we can consult hospice and this was discussed but not until the patient's son makes her a DNR; it is a moot point because hospice cannot come into the building and we are more than capable of keeping her comfortable until the end of her life    Time spent greater than 35 minutes;> 50% of time with patient was spent reviewing records, labs, tests and studies, counseling and developing plan of care   [name], MD

## 2018-08-04 NOTE — ED Notes (Signed)
Bed: ZS01 Expected date:  Expected time:  Means of arrival:  Comments: EMS-possible PNA

## 2018-08-06 DIAGNOSIS — J069 Acute upper respiratory infection, unspecified: Secondary | ICD-10-CM | POA: Diagnosis not present

## 2018-08-08 ENCOUNTER — Encounter: Payer: Self-pay | Admitting: Internal Medicine

## 2018-08-08 DIAGNOSIS — C7800 Secondary malignant neoplasm of unspecified lung: Secondary | ICD-10-CM | POA: Insufficient documentation

## 2018-08-08 DIAGNOSIS — D72829 Elevated white blood cell count, unspecified: Secondary | ICD-10-CM | POA: Insufficient documentation

## 2018-08-08 DIAGNOSIS — R9389 Abnormal findings on diagnostic imaging of other specified body structures: Secondary | ICD-10-CM | POA: Insufficient documentation

## 2018-08-08 DIAGNOSIS — J069 Acute upper respiratory infection, unspecified: Secondary | ICD-10-CM | POA: Diagnosis not present

## 2018-08-10 DIAGNOSIS — I10 Essential (primary) hypertension: Secondary | ICD-10-CM | POA: Diagnosis not present

## 2018-08-10 DIAGNOSIS — D649 Anemia, unspecified: Secondary | ICD-10-CM | POA: Diagnosis not present

## 2018-08-10 LAB — CBC AND DIFFERENTIAL
HCT: 39 (ref 36–46)
Hemoglobin: 12.6 (ref 12.0–16.0)
Neutrophils Absolute: 7
Platelets: 244 (ref 150–399)
WBC: 11.1

## 2018-08-10 LAB — BASIC METABOLIC PANEL
BUN: 19 (ref 4–21)
Creatinine: 0.5 (ref 0.5–1.1)
Glucose: 84
Potassium: 4.1 (ref 3.4–5.3)
Sodium: 142 (ref 137–147)

## 2018-08-12 ENCOUNTER — Non-Acute Institutional Stay (SKILLED_NURSING_FACILITY): Payer: Medicare Other | Admitting: Internal Medicine

## 2018-08-12 DIAGNOSIS — G40409 Other generalized epilepsy and epileptic syndromes, not intractable, without status epilepticus: Secondary | ICD-10-CM | POA: Diagnosis not present

## 2018-08-13 ENCOUNTER — Non-Acute Institutional Stay (SKILLED_NURSING_FACILITY): Payer: Medicare Other | Admitting: Internal Medicine

## 2018-08-13 ENCOUNTER — Encounter: Payer: Self-pay | Admitting: Internal Medicine

## 2018-08-13 DIAGNOSIS — E861 Hypovolemia: Secondary | ICD-10-CM | POA: Diagnosis not present

## 2018-08-13 DIAGNOSIS — I9589 Other hypotension: Secondary | ICD-10-CM | POA: Diagnosis not present

## 2018-08-13 NOTE — Progress Notes (Signed)
Location:  [facility] Room Number: 506-P Place of Service:  SNF (31)  [name], MD  [name], [name]  Extended Emergency Contact Information Primary Emergency Contact: [name] Address: [address]          [place], [place] [address] [name] of [place] Phone: [phone] Relation: Son Secondary Emergency Contact: [place], [place] of [place] Phone: [phone] Relation: Sister    Allergies: Demerol [meperidine]  Chief Complaint  Patient presents with   Acute Visit    Patient with low blood pressure    HPI: Patient is an 83 y.o. female who is being seen because her blood was 80/60.  The nurse very  wisely held her hydrochlorothiazide and her other medications..  Patient has been having vaginal bleeding for several months but of course has not been able to see a physician secondary to [place] patient was just recently diagnosed with cancer of unknown origin metastatic to the lung and just yesterday had a new onset seizure, with concern for metastatic disease to the brain.  Patient is still a full code.  Past Medical History:  Diagnosis Date   Anasarca 05/21/2016   Anxiety 11/03/2016   Closed TBI (traumatic brain injury) ([facility]) 04/21/2016   Dysphagia 07/13/2016   Gout    Hypertension    Obesity (BMI 30.0-34.9) 05/21/2016   Status post insertion of percutaneous endoscopic gastrostomy (PEG) tube ([facility]) 04/21/2016    Past Surgical History:  Procedure Laterality Date   CHOLECYSTECTOMY N/A 07/21/2017   Procedure: LAPAROSCOPIC CHOLECYSTECTOMY;  Surgeon: Erroll Luna, MD;  Location: [facility];  Service: General;  Laterality: N/A;   ERCP N/A 02/18/2018   Procedure: ENDOSCOPIC RETROGRADE CHOLANGIOPANCREATOGRAPHY (ERCP);  Surgeon: Milus Banister, MD;  Location: [facility];  Service: Endoscopy;  Laterality: N/A;    INTRAMEDULLARY (IM) NAIL INTERTROCHANTERIC Right 12/27/2016   Procedure: INTRAMEDULLARY (IM) NAIL INTERTROCHANTRIC RIGHT HIP;  Surgeon: Paralee Cancel, MD;  Location: [facility];  Service: Orthopedics;  Laterality: Right;   MASTECTOMY     REMOVAL OF STONES  02/18/2018   Procedure: REMOVAL OF STONES;  Surgeon: Milus Banister, MD;  Location: [facility];  Service: Endoscopy;;  balloon sweep   SPHINCTEROTOMY  02/18/2018   Procedure: Joan Mayans;  Surgeon: Milus Banister, MD;  Location: [facility];  Service: Endoscopy;;    Allergies as of 08/13/2018      Reactions   Demerol [meperidine] Swelling      Medication List       Accurate as of August 13, 2018  2:17 PM. If you have any questions, ask your nurse or doctor.        acetaminophen 325 MG tablet Commonly known as: TYLENOL Take 650 mg by mouth every 6 (six) hours as needed.   ALPRAZolam 0.5 MG tablet Commonly known as: XANAX Take 1 tablet (0.5 mg total) by mouth 2 (two) times daily.   bimatoprost 0.01 % Soln Commonly known as: LUMIGAN Place 1 drop into both eyes at bedtime. For glaucoma   bisacodyl 10 MG suppository Commonly known as: DULCOLAX Place 10 mg rectally daily as needed (constipation not relieved by MOM).   Ensure Take 237 mLs by mouth 3 (three) times daily.   nutrition supplement (JUVEN) Pack Take 1 packet by mouth 2 (two) times daily between meals. mix with 4-8oz water to aid in wound healing.  NUTRITIONAL SUPPLEMENT PO Take 1 each by mouth daily. Magic Cup with lunch   ertapenem 1 g injection Commonly known as: INVANZ Inject 1 g into the muscle daily.   escitalopram 5 MG tablet Commonly known as: LEXAPRO Take 5 mg by mouth daily.   feeding supplement (PRO-STAT SUGAR FREE 64) Liqd Take 30 mLs by mouth 2 (two) times daily.   FLEET ENEMA RE Place 1 each rectally daily as needed (constipation not relieved by bisacodyl suppository).   gabapentin 600 MG tablet Commonly known as: NEURONTIN Take 600  mg by mouth 3 (three) times daily.   hydrochlorothiazide 25 MG tablet Commonly known as: HYDRODIURIL Take 12.5 mg by mouth daily. Take 0.5 tablet to = 12.5 mg   levETIRAcetam 500 MG tablet Commonly known as: KEPPRA Take 500 mg by mouth 2 (two) times daily.   MILK OF MAGNESIA PO Take 30 mLs by mouth daily as needed (if no BM in 3 days).   mirtazapine 7.5 MG tablet Commonly known as: REMERON Take 7.5 mg by mouth at bedtime.   MULTIPLE VITAMINS-MINERALS PO Take by mouth. TAKE 1 TABLET BY MOUTH ONCE DAILY TO PROMOTE WOUND HEALING   nitrofurantoin (macrocrystal-monohydrate) 100 MG capsule Commonly known as: MACROBID Take 100 mg by mouth 2 (two) times daily. Start taking on: August 15, 2018   oxyCODONE-acetaminophen 5-325 MG tablet Commonly known as: PERCOCET/ROXICET Take 1 tablet by mouth 2 (two) times daily. Hold for somnolence   potassium chloride SA 20 MEQ tablet Commonly known as: K-DUR Take 20 mEq by mouth daily.   sennosides-docusate sodium 8.6-50 MG tablet Commonly known as: SENOKOT-S Take 2 tablets by mouth 2 (two) times daily as needed for constipation.   sodium chloride 0.45 % solution Inject into the vein continuous. 75 mL/hour via peripheral line indefinitely   venlafaxine XR 75 MG 24 hr capsule Commonly known as: EFFEXOR-XR Take 75 mg by mouth 2 (two) times daily. FOR DEPRESSION (DO NOT CRUSH)   Vitamin D3 1.25 MG (50000 UT) Caps Take 1 capsule by mouth once a week.       No orders of the defined types were placed in this encounter.   Immunization History  Administered Date(s) Administered   Influenza-Unspecified 12/19/2016, 11/26/2017   PPD Test 04/29/2016   Pneumococcal Conjugate-13 08/06/2016   Pneumococcal Polysaccharide-23 04/15/2016   Tdap 03/31/2016    Social History   Tobacco Use   Smoking status: Never Smoker   Smokeless tobacco: Never Used  Substance Use Topics   Alcohol use: Not Currently    Review of Systems  DATA  OBTAINED: from nurse-per history of present illness GENERAL:  no fevers, fatigue, appetite changes SKIN: No itching, rash HEENT: No complaint RESPIRATORY: No cough, wheezing, SOB CARDIAC: No chest pain, palpitations, lower extremity edema  GI: No abdominal pain, No N/V/D or constipation, No heartburn or reflux  GU: No dysuria, frequency or urgency, or incontinence  MUSCULOSKELETAL: No unrelieved bone/joint pain NEUROLOGIC: No headache, dizziness  PSYCHIATRIC: No overt anxiety or sadness  Vitals:   08/13/18 1405  BP: (!) 86/62  Pulse: 92  Resp: 17  Temp: (!) 97 F (36.1 C)   Body mass index is 20.37 kg/m. Physical Exam  GENERAL APPEARANCE: Alert, minimally conversant, No acute distress  SKIN: No diaphoresis rash HEENT: Unremarkable RESPIRATORY: Breathing is even, unlabored. Lung sounds are clear   CARDIOVASCULAR: Heart RRR no murmurs, rubs or gallops. No peripheral edema  GASTROINTESTINAL: Abdomen is soft, non-tender, not distended w/ normal bowel sounds.  GENITOURINARY: Bladder  non tender, not distended  MUSCULOSKELETAL: No abnormal joints or musculature NEUROLOGIC: Cranial nerves 2-12 grossly intact. Moves all extremities PSYCHIATRIC: Subdued, no behavioral issues  Patient Active Problem List   Diagnosis Date Noted   Elevated WBC count 08/08/2018   Abnormal chest x-ray 08/08/2018   Metastatic cancer to lung of unknown cell type ([facility]) 08/08/2018   Hypernatremia 05/26/2018   HCAP (healthcare-associated pneumonia) 05/18/2018   Acute renal failure (ARF) ([facility]) 05/07/2018   MRSA (methicillin resistant Staphylococcus aureus) septicemia ([facility]) 02/23/2018   Hypokalemia 02/23/2018   Bile duct stone    E. coli sepsis ([facility]) 02/17/2018   Cholecystitis 07/31/2017   Cholecystitis, acute with cholelithiasis 07/20/2017   Pressure injury of skin 07/20/2017   History of total right hip arthroplasty 01/05/2017   Acute blood loss as cause of postoperative anemia  01/05/2017   Escherichia coli urinary tract infection 01/05/2017   Acute encephalopathy 01/05/2017   Depression, recurrent ([facility]) 01/05/2017   Polyneuropathy 01/05/2017   Closed right hip fracture ([facility]) 12/25/2016   Anxiety 11/03/2016   Dysphagia 07/13/2016   Anasarca 05/21/2016   Obesity (BMI 30.0-34.9) 05/21/2016   Closed TBI (traumatic brain injury) ([facility]) 04/21/2016   Status post insertion of percutaneous endoscopic gastrostomy (PEG) tube ([facility]) 04/21/2016   Facial laceration 04/21/2016   Cheek wound, right, subsequent encounter 04/21/2016   Urinary tract infection due to extended-spectrum beta lactamase (ESBL) producing Escherichia coli 04/21/2016   Hypotension 04/21/2016   Gout    Hypertension     CMP     Component Value Date/Time   NA 141 08/04/2018 1358   NA 145 06/08/2018   NA 145 07/28/2017   K 3.4 (L) 08/04/2018 1358   K 3.7 07/28/2017   CL 102 08/04/2018 1358   CO2 30 08/04/2018 1358   GLUCOSE 97 08/04/2018 1358   BUN 20 08/04/2018 1358   BUN 25 (A) 06/08/2018   CREATININE 0.69 08/04/2018 1358   CREATININE 0.39 07/28/2017   CALCIUM 8.9 08/04/2018 1358   CALCIUM 8.1 07/28/2017   PROT 7.2 08/04/2018 1358   ALBUMIN 2.9 (L) 08/04/2018 1358   AST 17 08/04/2018 1358   ALT 12 08/04/2018 1358   ALKPHOS 82 08/04/2018 1358   BILITOT 0.7 08/04/2018 1358   GFRNONAA >60 08/04/2018 1358   GFRNONAA >90 07/28/2017   GFRAA >60 08/04/2018 1358   Recent Labs    02/16/18 2227  02/20/18 0325 02/21/18 0325  05/29/18 06/08/18 08/04/18 1358  NA  --    < > 147* 146*   < > 143 145 141  K  --    < > 3.7 3.0*   < > 3.8 3.9 3.4*  CL  --    < > 116* 112*  --   --   --  102  CO2  --    < > 24 25  --   --   --  30  GLUCOSE  --    < > 84 87  --   --   --  97  BUN  --    < > 10 8   < > 20 25* 20  CREATININE  --    < > 0.54 0.42*   < > 0.7 0.6 0.69  CALCIUM  --    < > 7.9* 7.6*  --   --   --  8.9  MG 2.1  --   --   --   --   --   --   --    < > =  values in this  interval not displayed.   Recent Labs    02/20/18 0325 02/21/18 0325 08/04/18 1358  AST 45* 30 17  ALT 44 33 12  ALKPHOS 358* 324* 82  BILITOT 1.9* 1.1 0.7  PROT 5.0* 4.8* 7.2  ALBUMIN 2.1* 1.9* 2.9*   Recent Labs    02/17/18 0435 02/18/18 0308  02/20/18 0325 02/21/18 0325  05/25/18 06/08/18 08/04/18 1358  WBC 19.0* 14.6*   < > 10.8* 7.6   < > 9.4 9.1 11.2*  NEUTROABS 16.7* 13.2*  --   --   --   --   --   --  6.5  HGB 10.1* 12.1   < > 10.4* 10.1*   < > 10.5* 11.7* 11.8*  HCT 34.9* 40.8   < > 35.6* 34.1*   < > 32* 36 40.3  MCV 90.6 87.9   < > 90.4 89.0  --   --   --  88.6  PLT 193 124*   < > 232 192   < > 226 250 293   < > = values in this interval not displayed.   No results for input(s): CHOL, LDLCALC, TRIG in the last 8760 hours.  Invalid input(s): HCL No results found for: MICROALBUR Lab Results  Component Value Date   TSH 1.68 08/06/2013   No results found for: HGBA1C No results found for: CHOL, HDL, LDLCALC, LDLDIRECT, TRIG, CHOLHDL  Significant Diagnostic Results in last 30 days:  Ct Chest Wo Contrast  Result Date: 08/04/2018 CLINICAL DATA:  Confusion and lethargy.  Abnormal chest x-ray. EXAM: CT CHEST WITHOUT CONTRAST TECHNIQUE: Multidetector CT imaging of the chest was performed following the standard protocol without IV contrast. COMPARISON:  Chest x-ray 08/04/2018 and more remote chest x-ray from 02/16/2018. FINDINGS: Cardiovascular: The heart is normal in size. No pericardial effusion. Mild tortuosity and ectasia of the thoracic aorta with scattered atherosclerotic calcifications mainly at the aortic arch. No significant coronary artery calcifications. Mediastinum/Nodes: Small scattered mediastinal and hilar lymph nodes but no mass or overt adenopathy. The esophagus is grossly normal. Lungs/Pleura: Innumerable bilateral pulmonary nodules of varying sizes consistent with diffuse pulmonary metastatic disease. This is significantly progressive when compared to a  prior abdominal CT scan from January 2020. 9.5 mm right upper lobe nodule on image number 59. 9 mm left upper lobe nodule on image number 57. 13.5 mm right middle lobe pulmonary nodule on image number 79. 16.5 mm right lower lobe nodule on image number 70. No infiltrates or effusions. Upper Abdomen: No significant upper abdominal findings. Musculoskeletal: Bilateral mastectomies. No chest wall mass, supraclavicular or axillary adenopathy. The thyroid gland is grossly normal. Osteoporosis and degenerative changes. Remote appearing lower thoracic compression fracture. No obvious bone lesions. Remote right humeral head and neck fractures. IMPRESSION: 1. Diffuse pulmonary metastatic disease of uncertain primary. 2. No mediastinal or hilar mass or adenopathy. 3. No upper abdominal or osseous metastatic disease is identified. Aortic Atherosclerosis (ICD10-I70.0). Electronically Signed   By: Marijo Sanes M.D.   On: 08/04/2018 17:13   Dg Chest Port 1 View  Result Date: 08/04/2018 CLINICAL DATA:  Evaluate for pneumonia. Elevated white blood cell count. Pna? EXAM: PORTABLE CHEST 1 VIEW COMPARISON:  02/16/2018. FINDINGS: Mediastinum and hilar structures normal. Heart size normal. New appearance of multiple pulmonary nodule densities noted bilaterally. Findings are concerning for metastatic disease. Infectious etiologies are pulmonary nodules cannot be excluded. No pleural effusion or pneumothorax. Surgical clips right axilla. Deformity right humerus again noted without interim change.  Diffuse osteopenia. Surgical clips right upper quadrant. IMPRESSION: New appearance of multiple bilateral pulmonary nodules, most worrisome for metastatic disease. Infectious etiologies including granulomatous disease cannot be excluded. Electronically Signed   By: Marcello Moores  Register   On: 08/04/2018 14:42    Assessment and Plan  Hypotension- the nurses were able to get a peripheral line patient was started with IV fluids with half-normal  saline with a 250 cc bolus then 100 cc an hour for 3 hours and then 75 cc an hour.  By the time I saw the patient she had begun to respond.  Plan on giving patient 2 to 3 L of fluid depending on her response.  I made multiple attempts to call patient's son as did others in the skilled nursing facility and he has not returned the call.  Patient was released from the hospital recently being diagnosed with metastatic cancer, for which she was not a candidate for treatment to come back to the skilled nursing facility for comfort care but patient is a full code and we cannot call hospice until patient is a DNR.   Late entry- contact with son was finally made and Judeen Hammans our Scientist, physiological spoke with him he wants his mother to be full code with chest compressions, intubation.  We will not send her to the hospital until this happens as they have already said there is nothing to offer her  No problem-specific Assessment & Plan notes found for this encounter.   Labs/tests ordered:    [name], MD

## 2018-08-14 DIAGNOSIS — M6281 Muscle weakness (generalized): Secondary | ICD-10-CM | POA: Diagnosis not present

## 2018-08-14 DIAGNOSIS — R1312 Dysphagia, oropharyngeal phase: Secondary | ICD-10-CM | POA: Diagnosis not present

## 2018-08-14 DIAGNOSIS — Z48815 Encounter for surgical aftercare following surgery on the digestive system: Secondary | ICD-10-CM | POA: Diagnosis not present

## 2018-08-16 ENCOUNTER — Encounter: Payer: Self-pay | Admitting: Internal Medicine

## 2018-08-16 DIAGNOSIS — G40409 Other generalized epilepsy and epileptic syndromes, not intractable, without status epilepticus: Secondary | ICD-10-CM | POA: Insufficient documentation

## 2018-08-16 NOTE — Progress Notes (Signed)
Location:  [facility] and [place] of Service:   SNF  [name], MD  [name], [name]  Extended Emergency Contact Information Primary Emergency Contact: [name] Address: [address]          [place], [place] [address] [name] of [place] Phone: [phone] Relation: Son Secondary Emergency Contact: [place], [place] of [place] Phone: [phone] Relation: Sister    Allergies: Demerol [meperidine]  Chief Complaint  Patient presents with  . Acute Visit    HPI: Patient is 83 y.o. female who who I am seeing today because patient had a witnessed by the nurse grand mal seizure today. seizure lasted several minutes and stopped on its own.  Patient has never had a seizure before.  Patient was just diagnosed with cancer of unknown primary metastatic to the lung..  Past Medical History:  Diagnosis Date  . Anasarca 05/21/2016  . Anxiety 11/03/2016  . Closed TBI (traumatic brain injury) ([place]) 04/21/2016  . Dysphagia 07/13/2016  . Gout   . Hypertension   . Obesity (BMI 30.0-34.9) 05/21/2016  . Status post insertion of percutaneous endoscopic gastrostomy (PEG) tube ([place]) 04/21/2016    Past Surgical History:  Procedure Laterality Date  . CHOLECYSTECTOMY N/A 07/21/2017   Procedure: LAPAROSCOPIC CHOLECYSTECTOMY;  Surgeon: Erroll Luna, MD;  Location: [facility];  Service: General;  Laterality: N/A;  . ERCP N/A 02/18/2018   Procedure: ENDOSCOPIC RETROGRADE CHOLANGIOPANCREATOGRAPHY (ERCP);  Surgeon: Milus Banister, MD;  Location: [facility];  Service: Endoscopy;  Laterality: N/A;  . INTRAMEDULLARY (IM) NAIL INTERTROCHANTERIC Right 12/27/2016   Procedure: INTRAMEDULLARY (IM) NAIL INTERTROCHANTRIC RIGHT HIP;  Surgeon: Paralee Cancel, MD;  Location: [facility];  Service: Orthopedics;  Laterality: Right;  . MASTECTOMY    . REMOVAL OF STONES   02/18/2018   Procedure: REMOVAL OF STONES;  Surgeon: Milus Banister, MD;  Location: [facility];  Service: Endoscopy;;  balloon sweep  . SPHINCTEROTOMY  02/18/2018   Procedure: SPHINCTEROTOMY;  Surgeon: Milus Banister, MD;  Location: [facility];  Service: Endoscopy;;    Allergies as of 08/12/2018      Reactions   Demerol [meperidine] Swelling      Medication List       Accurate as of August 12, 2018 11:59 PM. If you have any questions, ask your nurse or doctor.        acetaminophen 325 MG tablet Commonly known as: TYLENOL Take 650 mg by mouth every 6 (six) hours as needed.   ALPRAZolam 0.5 MG tablet Commonly known as: XANAX Take 1 tablet (0.5 mg total) by mouth 2 (two) times daily.   bimatoprost 0.01 % Soln Commonly known as: LUMIGAN Place 1 drop into both eyes at bedtime. For glaucoma   bisacodyl 10 MG suppository Commonly known as: DULCOLAX Place 10 mg rectally daily as needed (constipation not relieved by MOM).   Ensure Take 237 mLs by mouth 3 (three) times daily.   nutrition supplement (JUVEN) Pack Take 1 packet by mouth 2 (two) times daily between meals. mix with 4-8oz water to aid in wound healing.   NUTRITIONAL SUPPLEMENT PO Take 1 each by mouth daily. Magic Cup with lunch   escitalopram 5 MG tablet Commonly known as: LEXAPRO Take 5 mg by mouth daily.   feeding supplement (PRO-STAT SUGAR FREE 64) Liqd Take 30 mLs by mouth 2 (  two) times daily.   FLEET ENEMA RE Place 1 each rectally daily as needed (constipation not relieved by bisacodyl suppository).   gabapentin 600 MG tablet Commonly known as: NEURONTIN Take 600 mg by mouth 3 (three) times daily.   hydrochlorothiazide 25 MG tablet Commonly known as: HYDRODIURIL Take 12.5 mg by mouth daily. Take 0.5 tablet to = 12.5 mg   MILK OF MAGNESIA PO Take 30 mLs by mouth daily as needed (if no BM in 3 days).   mirtazapine 7.5 MG tablet Commonly known as: REMERON Take 7.5 mg by mouth at bedtime.    MULTIPLE VITAMINS-MINERALS PO Take by mouth. TAKE 1 TABLET BY MOUTH ONCE DAILY TO PROMOTE WOUND HEALING   oxyCODONE-acetaminophen 5-325 MG tablet Commonly known as: PERCOCET/ROXICET Take 1 tablet by mouth 2 (two) times daily. Hold for somnolence   potassium chloride SA 20 MEQ tablet Commonly known as: K-DUR Take 20 mEq by mouth daily.   sennosides-docusate sodium 8.6-50 MG tablet Commonly known as: SENOKOT-S Take 2 tablets by mouth 2 (two) times daily as needed for constipation.   venlafaxine XR 75 MG 24 hr capsule Commonly known as: EFFEXOR-XR Take 75 mg by mouth 2 (two) times daily. FOR DEPRESSION (DO NOT CRUSH)   Vitamin D3 1.25 MG (50000 UT) Caps Take 1 capsule by mouth once a week.       No orders of the defined types were placed in this encounter.   Immunization History  Administered Date(s) Administered  . Influenza-Unspecified 12/19/2016, 11/26/2017  . PPD Test 04/29/2016  . Pneumococcal Conjugate-13 08/06/2016  . Pneumococcal Polysaccharide-23 04/15/2016  . Tdap 03/31/2016    Social History   Tobacco Use  . Smoking status: Never Smoker  . Smokeless tobacco: Never Used  Substance Use Topics  . Alcohol use: Not Currently    Review of Systems      unable to obtain secondary to postictal state; per nursing as per history of present illness      Vitals:   08/16/18 2024  BP: 130/74  Pulse: 80  Resp: 18  Temp: 98.8 F (37.1 C)   Body mass index is 20.3 kg/m. Physical Exam  GENERAL APPEARANCE: Sleepy, minimally conversant, No acute distress  SKIN: No diaphoresis rash HEENT: Unremarkable RESPIRATORY: Breathing is even, unlabored. Lung sounds are clear   CARDIOVASCULAR: Heart RRR no murmurs, rubs or gallops. No peripheral edema  GASTROINTESTINAL: Abdomen is soft, non-tender, not distended w/ normal bowel sounds.  GENITOURINARY: Bladder non tender, not distended  MUSCULOSKELETAL: No abnormal joints or musculature NEUROLOGIC: Cranial nerves 2-12  grossly intact.;  Patient did not move during exam  PSYCHIATRIC: Mood Patient Active Problem List   Diagnosis Date Noted  . Elevated WBC count 08/08/2018  . Abnormal chest x-ray 08/08/2018  . Metastatic cancer to lung of unknown cell type ([place]) 08/08/2018  . Hypernatremia 05/26/2018  . HCAP (healthcare-associated pneumonia) 05/18/2018  . Acute renal failure (ARF) ([place]) 05/07/2018  . MRSA (methicillin resistant Staphylococcus aureus) septicemia ([place]) 02/23/2018  . Hypokalemia 02/23/2018  . Bile duct stone   . E. coli sepsis ([place]) 02/17/2018  . Cholecystitis 07/31/2017  . Cholecystitis, acute with cholelithiasis 07/20/2017  . Pressure injury of skin 07/20/2017  . History of total right hip arthroplasty 01/05/2017  . Acute blood loss as cause of postoperative anemia 01/05/2017  . Escherichia coli urinary tract infection 01/05/2017  . Acute encephalopathy 01/05/2017  . Depression, recurrent ([place]) 01/05/2017  . Polyneuropathy 01/05/2017  . Closed right hip fracture ([place]) 12/25/2016  . Anxiety  11/03/2016  . Dysphagia 07/13/2016  . Anasarca 05/21/2016  . Obesity (BMI 30.0-34.9) 05/21/2016  . Closed TBI (traumatic brain injury) ([place]) 04/21/2016  . Status post insertion of percutaneous endoscopic gastrostomy (PEG) tube ([place]) 04/21/2016  . Facial laceration 04/21/2016  . Cheek wound, right, subsequent encounter 04/21/2016  . Urinary tract infection due to extended-spectrum beta lactamase (ESBL) producing Escherichia coli 04/21/2016  . Hypotension 04/21/2016  . Gout   . Hypertension     CMP     Component Value Date/Time   NA 141 08/04/2018 1358   NA 145 06/08/2018   NA 145 07/28/2017   K 3.4 (L) 08/04/2018 1358   K 3.7 07/28/2017   CL 102 08/04/2018 1358   CO2 30 08/04/2018 1358   GLUCOSE 97 08/04/2018 1358   BUN 20 08/04/2018 1358   BUN 25 (A) 06/08/2018   CREATININE 0.69 08/04/2018 1358   CREATININE 0.39 07/28/2017   CALCIUM 8.9 08/04/2018 1358   CALCIUM 8.1 07/28/2017    PROT 7.2 08/04/2018 1358   ALBUMIN 2.9 (L) 08/04/2018 1358   AST 17 08/04/2018 1358   ALT 12 08/04/2018 1358   ALKPHOS 82 08/04/2018 1358   BILITOT 0.7 08/04/2018 1358   GFRNONAA >60 08/04/2018 1358   GFRNONAA >90 07/28/2017   GFRAA >60 08/04/2018 1358   Recent Labs    02/16/18 2227  02/20/18 0325 02/21/18 0325  05/29/18 06/08/18 08/04/18 1358  NA  --    < > 147* 146*   < > 143 145 141  K  --    < > 3.7 3.0*   < > 3.8 3.9 3.4*  CL  --    < > 116* 112*  --   --   --  102  CO2  --    < > 24 25  --   --   --  30  GLUCOSE  --    < > 84 87  --   --   --  97  BUN  --    < > 10 8   < > 20 25* 20  CREATININE  --    < > 0.54 0.42*   < > 0.7 0.6 0.69  CALCIUM  --    < > 7.9* 7.6*  --   --   --  8.9  MG 2.1  --   --   --   --   --   --   --    < > = values in this interval not displayed.   Recent Labs    02/20/18 0325 02/21/18 0325 08/04/18 1358  AST 45* 30 17  ALT 44 33 12  ALKPHOS 358* 324* 82  BILITOT 1.9* 1.1 0.7  PROT 5.0* 4.8* 7.2  ALBUMIN 2.1* 1.9* 2.9*   Recent Labs    02/17/18 0435 02/18/18 0308  02/20/18 0325 02/21/18 0325  05/25/18 06/08/18 08/04/18 1358  WBC 19.0* 14.6*   < > 10.8* 7.6   < > 9.4 9.1 11.2*  NEUTROABS 16.7* 13.2*  --   --   --   --   --   --  6.5  HGB 10.1* 12.1   < > 10.4* 10.1*   < > 10.5* 11.7* 11.8*  HCT 34.9* 40.8   < > 35.6* 34.1*   < > 32* 36 40.3  MCV 90.6 87.9   < > 90.4 89.0  --   --   --  88.6  PLT 193 124*   < > 232  192   < > 226 250 293   < > = values in this interval not displayed.   No results for input(s): CHOL, LDLCALC, TRIG in the last 8760 hours.  Invalid input(s): HCL No results found for: MICROALBUR Lab Results  Component Value Date   TSH 1.68 08/06/2013   No results found for: HGBA1C No results found for: CHOL, HDL, LDLCALC, LDLDIRECT, TRIG, CHOLHDL  Significant Diagnostic Results in last 30 days:  Ct Chest Wo Contrast  Result Date: 08/04/2018 CLINICAL DATA:  Confusion and lethargy.  Abnormal chest x-ray. EXAM:  CT CHEST WITHOUT CONTRAST TECHNIQUE: Multidetector CT imaging of the chest was performed following the standard protocol without IV contrast. COMPARISON:  Chest x-ray 08/04/2018 and more remote chest x-ray from 02/16/2018. FINDINGS: Cardiovascular: The heart is normal in size. No pericardial effusion. Mild tortuosity and ectasia of the thoracic aorta with scattered atherosclerotic calcifications mainly at the aortic arch. No significant coronary artery calcifications. Mediastinum/Nodes: Small scattered mediastinal and hilar lymph nodes but no mass or overt adenopathy. The esophagus is grossly normal. Lungs/Pleura: Innumerable bilateral pulmonary nodules of varying sizes consistent with diffuse pulmonary metastatic disease. This is significantly progressive when compared to a prior abdominal CT scan from January 2020. 9.5 mm right upper lobe nodule on image number 59. 9 mm left upper lobe nodule on image number 57. 13.5 mm right middle lobe pulmonary nodule on image number 79. 16.5 mm right lower lobe nodule on image number 70. No infiltrates or effusions. Upper Abdomen: No significant upper abdominal findings. Musculoskeletal: Bilateral mastectomies. No chest wall mass, supraclavicular or axillary adenopathy. The thyroid gland is grossly normal. Osteoporosis and degenerative changes. Remote appearing lower thoracic compression fracture. No obvious bone lesions. Remote right humeral head and neck fractures. IMPRESSION: 1. Diffuse pulmonary metastatic disease of uncertain primary. 2. No mediastinal or hilar mass or adenopathy. 3. No upper abdominal or osseous metastatic disease is identified. Aortic Atherosclerosis (ICD10-I70.0). Electronically Signed   By: Marijo Sanes M.D.   On: 08/04/2018 17:13   Dg Chest Port 1 View  Result Date: 08/04/2018 CLINICAL DATA:  Evaluate for pneumonia. Elevated white blood cell count. Pna? EXAM: PORTABLE CHEST 1 VIEW COMPARISON:  02/16/2018. FINDINGS: Mediastinum and hilar  structures normal. Heart size normal. New appearance of multiple pulmonary nodule densities noted bilaterally. Findings are concerning for metastatic disease. Infectious etiologies are pulmonary nodules cannot be excluded. No pleural effusion or pneumothorax. Surgical clips right axilla. Deformity right humerus again noted without interim change. Diffuse osteopenia. Surgical clips right upper quadrant. IMPRESSION: New appearance of multiple bilateral pulmonary nodules, most worrisome for metastatic disease. Infectious etiologies including granulomatous disease cannot be excluded. Electronically Signed   By: [place]   On: 08/04/2018 14:42    Assessment and Plan  Grand mal seizure, new onset- probably metastatic disease to the brain.  Patient is postictal but lightening up enough to start on Keppra 500 mg every 12 hours.  Continue to monitor      [name], MD

## 2018-08-17 DIAGNOSIS — M6281 Muscle weakness (generalized): Secondary | ICD-10-CM | POA: Diagnosis not present

## 2018-08-17 DIAGNOSIS — Z48815 Encounter for surgical aftercare following surgery on the digestive system: Secondary | ICD-10-CM | POA: Diagnosis not present

## 2018-08-17 DIAGNOSIS — L89153 Pressure ulcer of sacral region, stage 3: Secondary | ICD-10-CM | POA: Diagnosis not present

## 2018-08-17 DIAGNOSIS — R1312 Dysphagia, oropharyngeal phase: Secondary | ICD-10-CM | POA: Diagnosis not present

## 2018-08-18 DIAGNOSIS — Z48815 Encounter for surgical aftercare following surgery on the digestive system: Secondary | ICD-10-CM | POA: Diagnosis not present

## 2018-08-18 DIAGNOSIS — M6281 Muscle weakness (generalized): Secondary | ICD-10-CM | POA: Diagnosis not present

## 2018-08-18 DIAGNOSIS — R1312 Dysphagia, oropharyngeal phase: Secondary | ICD-10-CM | POA: Diagnosis not present

## 2018-08-19 DIAGNOSIS — D649 Anemia, unspecified: Secondary | ICD-10-CM | POA: Diagnosis not present

## 2018-08-19 DIAGNOSIS — Z48815 Encounter for surgical aftercare following surgery on the digestive system: Secondary | ICD-10-CM | POA: Diagnosis not present

## 2018-08-19 DIAGNOSIS — R1312 Dysphagia, oropharyngeal phase: Secondary | ICD-10-CM | POA: Diagnosis not present

## 2018-08-19 DIAGNOSIS — M6281 Muscle weakness (generalized): Secondary | ICD-10-CM | POA: Diagnosis not present

## 2018-08-19 DIAGNOSIS — I10 Essential (primary) hypertension: Secondary | ICD-10-CM | POA: Diagnosis not present

## 2018-08-19 LAB — BASIC METABOLIC PANEL
BUN: 7 (ref 4–21)
Creatinine: 0.3 — AB (ref 0.5–1.1)
Glucose: 110
Potassium: 3.5 (ref 3.4–5.3)
Sodium: 142 (ref 137–147)

## 2018-08-20 DIAGNOSIS — M6281 Muscle weakness (generalized): Secondary | ICD-10-CM | POA: Diagnosis not present

## 2018-08-20 DIAGNOSIS — R1312 Dysphagia, oropharyngeal phase: Secondary | ICD-10-CM | POA: Diagnosis not present

## 2018-08-20 DIAGNOSIS — Z48815 Encounter for surgical aftercare following surgery on the digestive system: Secondary | ICD-10-CM | POA: Diagnosis not present

## 2018-08-21 DIAGNOSIS — Z48815 Encounter for surgical aftercare following surgery on the digestive system: Secondary | ICD-10-CM | POA: Diagnosis not present

## 2018-08-21 DIAGNOSIS — M6281 Muscle weakness (generalized): Secondary | ICD-10-CM | POA: Diagnosis not present

## 2018-08-21 DIAGNOSIS — R1312 Dysphagia, oropharyngeal phase: Secondary | ICD-10-CM | POA: Diagnosis not present

## 2018-08-24 ENCOUNTER — Encounter: Payer: Self-pay | Admitting: Internal Medicine

## 2018-08-24 ENCOUNTER — Non-Acute Institutional Stay (SKILLED_NURSING_FACILITY): Payer: Medicare Other | Admitting: Internal Medicine

## 2018-08-24 DIAGNOSIS — G629 Polyneuropathy, unspecified: Secondary | ICD-10-CM | POA: Diagnosis not present

## 2018-08-24 DIAGNOSIS — L89153 Pressure ulcer of sacral region, stage 3: Secondary | ICD-10-CM | POA: Diagnosis not present

## 2018-08-24 DIAGNOSIS — G903 Multi-system degeneration of the autonomic nervous system: Secondary | ICD-10-CM

## 2018-08-24 DIAGNOSIS — G40409 Other generalized epilepsy and epileptic syndromes, not intractable, without status epilepticus: Secondary | ICD-10-CM | POA: Diagnosis not present

## 2018-08-24 DIAGNOSIS — E870 Hyperosmolality and hypernatremia: Secondary | ICD-10-CM | POA: Diagnosis not present

## 2018-08-24 DIAGNOSIS — R1312 Dysphagia, oropharyngeal phase: Secondary | ICD-10-CM | POA: Diagnosis not present

## 2018-08-24 DIAGNOSIS — M6281 Muscle weakness (generalized): Secondary | ICD-10-CM | POA: Diagnosis not present

## 2018-08-24 DIAGNOSIS — C7800 Secondary malignant neoplasm of unspecified lung: Secondary | ICD-10-CM | POA: Diagnosis not present

## 2018-08-24 DIAGNOSIS — Z48815 Encounter for surgical aftercare following surgery on the digestive system: Secondary | ICD-10-CM | POA: Diagnosis not present

## 2018-08-24 NOTE — Progress Notes (Signed)
Location:  [facility] Room Number: 206-D Place of Service:  SNF [phone]) Provider:  Granville Lewis, PA-C  [name], MD  [name], [name]  Extended Emergency Contact Information Primary Emergency Contact: [name] Address: [address]          [place], [place] [address] [name] Phone: [phone] Relation: Son Secondary Emergency Contact: [place], [place] Phone: [phone] Relation: Sister  Code Status:  Full Code Goals of care: Advanced Directive information Advanced Directives 08/04/2018  Does Patient Have a Medical Advance Directive? No  Would patient like information on creating a medical advance directive? No - Patient declined     Chief Complaint  Patient presents with  . 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 female seen today for medical management of chronic diseases.  As noted above. Patient was seen most recently for hypotension- as well as a suspected seizure.  This is a context of concern for metastatic disease to the brain.  In regards to the hypotension she did respond to IV fluids.  Her hydrochlorothiazide also was temporarily held recent blood pressures appear to be stable-136/70- 131/65-114/64.  Patient was recently released from the hospital after being diagnosed with metastatic cancer thought not to be a candidate for treatment.  At this point his son has expressed to facility that he would like his mother to continue to be full code for now  Patient also was noted recently to have what appear to be a grand mal  seizure that stopped on its own.  She has been started on Keppra there have been no other reported seizures to my knowledge.  Currently patient is getting weekly BMPs to keep an eye on her sodium on lab done on 15 July sodium was 142.  Appears she does have a history of bilateral heel wounds which are followed by the wound care nurse as well as consultant physician assistant- she also has a sacral wound which is followed by the wound care team as well  She also has a history of vaginal bleeding but obtaining consult has been difficult because of the coronavirus quarantine  Currently she is lying in bed comfortably  She is alert is speaking but does have cognitive deficits secondary to traumatic brain injury   Past Medical History:  Diagnosis Date  . Anasarca 05/21/2016  . Anxiety 11/03/2016  . Closed TBI (traumatic brain injury) ([facility]) 04/21/2016  . Dysphagia 07/13/2016  . Gout   . Hypertension   . Obesity (BMI 30.0-34.9) 05/21/2016  . Status post insertion of percutaneous endoscopic gastrostomy (PEG) tube ([facility]) 04/21/2016   Past Surgical History:  Procedure Laterality Date  . CHOLECYSTECTOMY N/A 07/21/2017   Procedure: LAPAROSCOPIC CHOLECYSTECTOMY;  Surgeon: Erroll Luna, MD;  Location: [facility];  Service: General;  Laterality: N/A;  . ERCP N/A 02/18/2018   Procedure: ENDOSCOPIC RETROGRADE CHOLANGIOPANCREATOGRAPHY (ERCP);  Surgeon: Milus Banister, MD;  Location: [facility];  Service: Endoscopy;  Laterality: N/A;  . INTRAMEDULLARY (IM) NAIL INTERTROCHANTERIC Right 12/27/2016   Procedure: INTRAMEDULLARY (IM) NAIL INTERTROCHANTRIC RIGHT HIP;  Surgeon: Paralee Cancel, MD;  Location: [facility];  Service: Orthopedics;  Laterality: Right;  . MASTECTOMY    . REMOVAL OF STONES  02/18/2018   Procedure: REMOVAL OF STONES;  Surgeon: Milus Banister, MD;  Location: [facility];  Service: Endoscopy;;  balloon sweep  . SPHINCTEROTOMY  02/18/2018   Procedure: SPHINCTEROTOMY;  Surgeon: Milus Banister,  MD;  Location: [facility];  Service: Endoscopy;;    Allergies  Allergen Reactions  . Demerol [Meperidine] Swelling    Outpatient Encounter Medications as of 08/24/2018  Medication Sig  . acetaminophen (TYLENOL) 325 MG tablet Take 650 mg by mouth every 6 (six) hours as needed.   . Amino Acids-Protein Hydrolys (FEEDING SUPPLEMENT, PRO-STAT SUGAR FREE 64,) LIQD Take 30 mLs by mouth 2 (two) times daily.   . bimatoprost (LUMIGAN) 0.01 % SOLN Place 1 drop into both eyes at bedtime. For glaucoma  . bisacodyl (DULCOLAX) 10 MG suppository Place 10 mg rectally daily as needed (constipation not relieved by MOM).  . Cholecalciferol (VITAMIN D3) 1.25 MG (50000 UT) CAPS Take 1 capsule by mouth once a week.  . ENSURE (ENSURE) Take 237 mLs by mouth 3 (three) times daily. Prefers chocolate if available  . hydrochlorothiazide (HYDRODIURIL) 25 MG tablet Take 12.5 mg by mouth daily. Take 0.5 tablet to = 12.5 mg  . levETIRAcetam (KEPPRA) 500 MG tablet Take 500 mg by mouth 2 (two) times daily.   . Magnesium Hydroxide (MILK OF MAGNESIA PO) Take 30 mLs by mouth daily as needed (if no BM in 3 days).  . MULTIPLE VITAMINS-MINERALS PO Take by mouth. TAKE 1 TABLET BY MOUTH ONCE DAILY TO PROMOTE WOUND HEALING  . nutrition supplement, JUVEN, (JUVEN) PACK Take 1 packet by mouth 2 (two) times daily between meals. mix with 4-8oz water to aid in wound healing.  . Nutritional Supplements (NUTRITIONAL SUPPLEMENT PO) Take 1 each by mouth daily. Magic Cup with lunch  . potassium chloride SA (K-DUR,KLOR-CON) 20 MEQ tablet Take 20 mEq by mouth daily.   . sennosides-docusate sodium (SENOKOT-S) 8.6-50 MG tablet Take 2 tablets by mouth 2 (two) times daily as needed for constipation.  . sodium chloride 0.45 % solution Inject into the vein continuous. 75 mL/hour via peripheral line indefinitely  . Sodium Phosphates (FLEET ENEMA RE) Place 1 each rectally daily as needed (constipation not relieved by bisacodyl suppository).  .  [DISCONTINUED] ALPRAZolam (XANAX) 0.5 MG tablet Take 1 tablet (0.5 mg total) by mouth 2 (two) times daily.  . [DISCONTINUED] escitalopram (LEXAPRO) 5 MG tablet Take 5 mg by mouth daily.  . [DISCONTINUED] gabapentin (NEURONTIN) 600 MG tablet Take 600 mg by mouth 3 (three) times daily.  . [DISCONTINUED] mirtazapine (REMERON) 7.5 MG tablet Take 7.5 mg by mouth at bedtime.   . [DISCONTINUED] oxyCODONE-acetaminophen (PERCOCET/ROXICET) 5-325 MG tablet Take 1 tablet by mouth 2 (two) times daily. Hold for somnolence  . [DISCONTINUED] venlafaxine XR (EFFEXOR-XR) 75 MG 24 hr capsule Take 75 mg by mouth 2 (two) times daily. FOR DEPRESSION (DO NOT CRUSH)   No facility-administered encounter medications on file as of 08/24/2018.     Review of Systems Is essentially unobtainable secondary to patient being poor historian- as noted above-please see HPI Immunization History  Administered Date(s) Administered  . Influenza-Unspecified 12/19/2016, 11/26/2017  . PPD Test 04/29/2016  . Pneumococcal Conjugate-13 08/06/2016  . Pneumococcal Polysaccharide-23 04/15/2016  . Tdap 03/31/2016   Pertinent  Health Maintenance Due  Topic Date Due  . DEXA SCAN  07/20/2019 (Originally 10/02/2002)  . INFLUENZA VACCINE  09/05/2018  .  PNA vac Low Risk Adult  Completed   Fall Risk  08/06/2017 08/02/2016  Falls in the past year? Yes Yes  Number falls in past yr: - 1  Injury with Fall? Yes Yes  Comment hip fracture R foot   Functional Status Survey:    Vitals:   08/24/18 1048  BP: 114/64  Pulse: 74  Resp: 18  Temp: 98.5 F (36.9 C)  TempSrc: Oral  Weight: 123 lb (55.8 kg)  Height: 5\' 5"  (1.651 m)   Body mass index is 20.47 kg/m. Physical Exam   In general this is a alert-minimally conversant elderly female in no distress.  Her skin is warm and dry.  She does have heel wounds which are currently covered as well as a sacral wound which is being followed by the wound care team as noted above was not evaluated  today because of patient positioning  Eyes visual acuity appears grossly intact.  Oropharynx from what I can tell was clear although she did not really follow verbal instructions to open her mouth wide.  Chest is clear to auscultation there is no labored breathing.  Heart is regular rate and rhythm without murmur gallop or rub she does not appear to have significant lower extremity edema she does have protective boots on as well as wrapping of her feet bilaterally.  Abdomen is soft does not appear to be tender there are positive bowel sounds.  GU she does have a Foley catheter draining amber-colored urine  Musculoskeletal could not really appreciate abnormal joints or musculature.  Neurologic she is alert cranial nerves appear to be grossly intact she does not really move.  Psych she is not agitated with exam has difficulty following verbal commands but she is alert does speak some   Labs reviewed: August 19, 2018.  Sodium 142 potassium 3.5 BUN 7.1 creatinine 0.32.     Recent Labs    02/16/18 2227  02/20/18 0325 02/21/18 0325  05/29/18 06/08/18 08/04/18 1358  NA  --    < > 147* 146*   < > 143 145 141  K  --    < > 3.7 3.0*   < > 3.8 3.9 3.4*  CL  --    < > 116* 112*  --   --   --  102  CO2  --    < > 24 25  --   --   --  30  GLUCOSE  --    < > 84 87  --   --   --  97  BUN  --    < > 10 8   < > 20 25* 20  CREATININE  --    < > 0.54 0.42*   < > 0.7 0.6 0.69  CALCIUM  --    < > 7.9* 7.6*  --   --   --  8.9  MG 2.1  --   --   --   --   --   --   --    < > = values in this interval not displayed.   Recent Labs    02/20/18 0325 02/21/18 0325 08/04/18 1358  AST 45* 30 17  ALT 44 33 12  ALKPHOS 358* 324* 82  BILITOT 1.9* 1.1 0.7  PROT 5.0* 4.8* 7.2  ALBUMIN 2.1* 1.9* 2.9*   Recent Labs    02/17/18 0435 02/18/18 0308  02/20/18 0325 02/21/18 0325  05/25/18 06/08/18 08/04/18 1358  WBC 19.0* 14.6*   < >  10.8* 7.6   < > 9.4 9.1 11.2*  NEUTROABS 16.7* 13.2*  --   --    --   --   --   --  6.5  HGB 10.1* 12.1   < > 10.4* 10.1*   < > 10.5* 11.7* 11.8*  HCT 34.9* 40.8   < > 35.6* 34.1*   < > 32* 36 40.3  MCV 90.6 87.9   < > 90.4 89.0  --   --   --  88.6  PLT 193 124*   < > 232 192   < > 226 250 293   < > = values in this interval not displayed.   Lab Results  Component Value Date   TSH 1.68 08/06/2013   No results found for: HGBA1C No results found for: CHOL, HDL, LDLCALC, LDLDIRECT, TRIG, CHOLHDL  Significant Diagnostic Results in last 30 days:  Ct Chest Wo Contrast  Result Date: 08/04/2018 CLINICAL DATA:  Confusion and lethargy.  Abnormal chest x-ray. EXAM: CT CHEST WITHOUT CONTRAST TECHNIQUE: Multidetector CT imaging of the chest was performed following the standard protocol without IV contrast. COMPARISON:  Chest x-ray 08/04/2018 and more remote chest x-ray from 02/16/2018. FINDINGS: Cardiovascular: The heart is normal in size. No pericardial effusion. Mild tortuosity and ectasia of the thoracic aorta with scattered atherosclerotic calcifications mainly at the aortic arch. No significant coronary artery calcifications. Mediastinum/Nodes: Small scattered mediastinal and hilar lymph nodes but no mass or overt adenopathy. The esophagus is grossly normal. Lungs/Pleura: Innumerable bilateral pulmonary nodules of varying sizes consistent with diffuse pulmonary metastatic disease. This is significantly progressive when compared to a prior abdominal CT scan from January 2020. 9.5 mm right upper lobe nodule on image number 59. 9 mm left upper lobe nodule on image number 57. 13.5 mm right middle lobe pulmonary nodule on image number 79. 16.5 mm right lower lobe nodule on image number 70. No infiltrates or effusions. Upper Abdomen: No significant upper abdominal findings. Musculoskeletal: Bilateral mastectomies. No chest wall mass, supraclavicular or axillary adenopathy. The thyroid gland is grossly normal. Osteoporosis and degenerative changes. Remote appearing lower  thoracic compression fracture. No obvious bone lesions. Remote right humeral head and neck fractures. IMPRESSION: 1. Diffuse pulmonary metastatic disease of uncertain primary. 2. No mediastinal or hilar mass or adenopathy. 3. No upper abdominal or osseous metastatic disease is identified. Aortic Atherosclerosis (ICD10-I70.0). Electronically Signed   By: Marijo Sanes M.D.   On: 08/04/2018 17:13   Dg Chest Port 1 View  Result Date: 08/04/2018 CLINICAL DATA:  Evaluate for pneumonia. Elevated white blood cell count. Pna? EXAM: PORTABLE CHEST 1 VIEW COMPARISON:  02/16/2018. FINDINGS: Mediastinum and hilar structures normal. Heart size normal. New appearance of multiple pulmonary nodule densities noted bilaterally. Findings are concerning for metastatic disease. Infectious etiologies are pulmonary nodules cannot be excluded. No pleural effusion or pneumothorax. Surgical clips right axilla. Deformity right humerus again noted without interim change. Diffuse osteopenia. Surgical clips right upper quadrant. IMPRESSION: New appearance of multiple bilateral pulmonary nodules, most worrisome for metastatic disease. Infectious etiologies including granulomatous disease cannot be excluded. Electronically Signed   By: Marcello Moores  Register   On: 08/04/2018 14:42    Assessment/Plan  #1 history of hypotension-at this point appears to be stabilized she has required IV fluids at times.  At this point continue to monitor.  2.  History of hypernatremia- this appears to be stabilized again at times she will need IV fluids last sodium was 142 on July 15 update BMP is  pending she is also on low-dose hydrochlorothiazide  3.  History of cancer of unknown origin with metastasis to lungs- this was evaluated in the hospital and thought not to be a candidate for invasive interventions- she has returned to the facility-she does remain under full code per her son's wishes   #4 history of vaginal bleeding again obtaining a consult has  been difficult because of coronavirus quarantine at this point continue to monitor.  5.-  History of polyneuropathy she continues on Neurontin 300 mg 3 times a day.  6.-  History of seizure disorder she is on Keppra 500 mg twice daily this is thought possibly be related to her metastasis-at this point will monitor.  7.-  History of depression she continues on Lexapro.   [id number]

## 2018-08-25 ENCOUNTER — Encounter: Payer: Self-pay | Admitting: Internal Medicine

## 2018-08-25 ENCOUNTER — Non-Acute Institutional Stay (SKILLED_NURSING_FACILITY): Payer: Medicare Other | Admitting: Internal Medicine

## 2018-08-25 DIAGNOSIS — F0280 Dementia in other diseases classified elsewhere without behavioral disturbance: Secondary | ICD-10-CM

## 2018-08-25 DIAGNOSIS — R627 Adult failure to thrive: Secondary | ICD-10-CM

## 2018-08-25 DIAGNOSIS — Z48815 Encounter for surgical aftercare following surgery on the digestive system: Secondary | ICD-10-CM | POA: Diagnosis not present

## 2018-08-25 DIAGNOSIS — Z20828 Contact with and (suspected) exposure to other viral communicable diseases: Secondary | ICD-10-CM | POA: Diagnosis not present

## 2018-08-25 DIAGNOSIS — G301 Alzheimer's disease with late onset: Secondary | ICD-10-CM | POA: Diagnosis not present

## 2018-08-25 DIAGNOSIS — M6281 Muscle weakness (generalized): Secondary | ICD-10-CM | POA: Diagnosis not present

## 2018-08-25 DIAGNOSIS — R1312 Dysphagia, oropharyngeal phase: Secondary | ICD-10-CM | POA: Diagnosis not present

## 2018-08-25 MED ORDER — HYDROCODONE-ACETAMINOPHEN 5-325 MG PO TABS: 1.0000 | ORAL_TABLET | Freq: Two times a day (BID) | ORAL | 0 refills | Status: DC

## 2018-08-25 NOTE — Progress Notes (Signed)
Location:  [facility] Room Number: 206-D Place of Service:  SNF ([id number]  [name], MD  [name], [name]  Extended Emergency Contact Information Primary Emergency Contact: [name] Address: [address]          [place], [place] [address] [name] of [place] Phone: [phone] Relation: Son Secondary Emergency Contact: [address], [place] of [place] Phone: [phone] Relation: Sister    Allergies: Demerol [meperidine]  Chief Complaint  Patient presents with  . Acute Visit    HPI: Patient is 83 y.o. female who is being seen for pain.  Both nursing and wound care nurse note that when patient is moved at all she is yells out in pain.  Her IV fluids have been stopped patient is still eating some.  We cannot keep up her IV fluids forever.  Per nursing she is about the same as she has been, she is continuing to failure to thrive.  Past Medical History:  Diagnosis Date  . Anasarca 05/21/2016  . Anxiety 11/03/2016  . Closed TBI (traumatic brain injury) ([facility]) 04/21/2016  . Dysphagia 07/13/2016  . Gout   . Hypertension   . Obesity (BMI 30.0-34.9) 05/21/2016  . Status post insertion of percutaneous endoscopic gastrostomy (PEG) tube ([facility]) 04/21/2016    Past Surgical History:  Procedure Laterality Date  . CHOLECYSTECTOMY N/A 07/21/2017   Procedure: LAPAROSCOPIC CHOLECYSTECTOMY;  Surgeon: Erroll Luna, MD;  Location: [facility];  Service: General;  Laterality: N/A;  . ERCP N/A 02/18/2018   Procedure: ENDOSCOPIC RETROGRADE CHOLANGIOPANCREATOGRAPHY (ERCP);  Surgeon: Milus Banister, MD;  Location: [facility];  Service: Endoscopy;  Laterality: N/A;  . INTRAMEDULLARY (IM) NAIL INTERTROCHANTERIC Right 12/27/2016   Procedure: INTRAMEDULLARY (IM) NAIL INTERTROCHANTRIC RIGHT HIP;  Surgeon: Paralee Cancel, MD;  Location: [facility];   Service: Orthopedics;  Laterality: Right;  . MASTECTOMY    . REMOVAL OF STONES  02/18/2018   Procedure: REMOVAL OF STONES;  Surgeon: Milus Banister, MD;  Location: [facility];  Service: Endoscopy;;  balloon sweep  . SPHINCTEROTOMY  02/18/2018   Procedure: SPHINCTEROTOMY;  Surgeon: Milus Banister, MD;  Location: [facility];  Service: Endoscopy;;    Allergies as of 08/25/2018      Reactions   Demerol [meperidine] Swelling      Medication List       Accurate as of August 25, 2018 11:59 PM. If you have any questions, ask your nurse or doctor.        STOP taking these medications   sodium chloride 0.45 % solution Stopped by: Inocencio Homes, MD     TAKE these medications   acetaminophen 325 MG tablet Commonly known as: TYLENOL Take 650 mg by mouth every 6 (six) hours as needed.   bimatoprost 0.01 % Soln Commonly known as: LUMIGAN Place 1 drop into both eyes at bedtime. For glaucoma   bisacodyl 10 MG suppository Commonly known as: DULCOLAX Place 10 mg rectally daily as needed (constipation not relieved by MOM).   Ensure Take 237 mLs by mouth 3 (three) times daily. Prefers chocolate if available   nutrition supplement (JUVEN) Pack Take 1 packet by mouth 2 (two) times daily between meals. mix with 4-8oz water to aid in wound healing.   NUTRITIONAL SUPPLEMENT PO Take 1 each by mouth daily. Magic Cup with lunch   feeding supplement (  PRO-STAT SUGAR FREE 64) Liqd Take 30 mLs by mouth 2 (two) times daily.   FLEET ENEMA RE Place 1 each rectally daily as needed (constipation not relieved by bisacodyl suppository).   hydrochlorothiazide 25 MG tablet Commonly known as: HYDRODIURIL Take 12.5 mg by mouth daily. Take 0.5 tablet to = 12.5 mg   HYDROcodone-acetaminophen 5-325 MG tablet Commonly known as: Norco Take 1 tablet by mouth every 12 (twelve) hours. Started by: Inocencio Homes, MD   levETIRAcetam 500 MG tablet Commonly known as: KEPPRA Take 500 mg by mouth 2 (two)  times daily.   MILK OF MAGNESIA PO Take 30 mLs by mouth daily as needed (if no BM in 3 days).   MULTIPLE VITAMINS-MINERALS PO Take by mouth. TAKE 1 TABLET BY MOUTH ONCE DAILY TO PROMOTE WOUND HEALING   potassium chloride SA 20 MEQ tablet Commonly known as: K-DUR Take 20 mEq by mouth daily.   sennosides-docusate sodium 8.6-50 MG tablet Commonly known as: SENOKOT-S Take 2 tablets by mouth 2 (two) times daily as needed for constipation.   Vitamin D3 1.25 MG (50000 UT) Caps Take 1 capsule by mouth once a week.       Meds ordered this encounter  Medications  . HYDROcodone-acetaminophen (NORCO) 5-325 MG tablet    Sig: Take 1 tablet by mouth every 12 (twelve) hours.    Dispense:  60 tablet    Refill:  0    Immunization History  Administered Date(s) Administered  . Influenza-Unspecified 12/19/2016, 11/26/2017  . PPD Test 04/29/2016  . Pneumococcal Conjugate-13 08/06/2016  . Pneumococcal Polysaccharide-23 04/15/2016  . Tdap 03/31/2016    Social History   Tobacco Use  . Smoking status: Never Smoker  . Smokeless tobacco: Never Used  Substance Use Topics  . Alcohol use: Not Currently    Review of Systems     unable to obtain secondary to patient condition; nursing- no acute concerns other than the usual continuing to fail to thrive    Vitals:   08/25/18 1240  BP: 121/80  Pulse: 92  Resp: 17  Temp: 97.7 F (36.5 C)   Body mass index is 20.47 kg/m. Physical Exam  GENERAL APPEARANCE:  No acute distress  SKIN: No diaphoresis rash HEENT: Unremarkable RESPIRATORY: Breathing is even, unlabored. Lung sounds are clear   CARDIOVASCULAR: Heart RRR no murmurs, rubs or gallops. No peripheral edema  GASTROINTESTINAL: Abdomen is soft, non-tender, not distended w/ normal bowel sounds.  GENITOURINARY: Bladder non tender, not distended  MUSCULOSKELETAL: No abnormal joints or musculature NEUROLOGIC: Cranial nerves 2-12 grossly intact. Moves all extremities PSYCHIATRIC:  Dementia, no behavioral issues  Patient Active Problem List   Diagnosis Date Noted  . Grand mal seizure ([facility]) 08/16/2018  . Elevated WBC count 08/08/2018  . Abnormal chest x-ray 08/08/2018  . Metastatic cancer to lung of unknown cell type ([facility]) 08/08/2018  . Hypernatremia 05/26/2018  . HCAP (healthcare-associated pneumonia) 05/18/2018  . Acute renal failure (ARF) ([facility]) 05/07/2018  . MRSA (methicillin resistant Staphylococcus aureus) septicemia ([facility]) 02/23/2018  . Hypokalemia 02/23/2018  . Bile duct stone   . E. coli sepsis ([facility]) 02/17/2018  . Cholecystitis 07/31/2017  . Cholecystitis, acute with cholelithiasis 07/20/2017  . Pressure injury of skin 07/20/2017  . History of total right hip arthroplasty 01/05/2017  . Acute blood loss as cause of postoperative anemia 01/05/2017  . Escherichia coli urinary tract infection 01/05/2017  . Acute encephalopathy 01/05/2017  . Depression, recurrent ([facility]) 01/05/2017  . Polyneuropathy 01/05/2017  . Closed right hip fracture ([facility])  12/25/2016  . Anxiety 11/03/2016  . Dysphagia 07/13/2016  . Anasarca 05/21/2016  . Obesity (BMI 30.0-34.9) 05/21/2016  . Closed TBI (traumatic brain injury) ([facility]) 04/21/2016  . Status post insertion of percutaneous endoscopic gastrostomy (PEG) tube ([facility]) 04/21/2016  . Facial laceration 04/21/2016  . Cheek wound, right, subsequent encounter 04/21/2016  . Urinary tract infection due to extended-spectrum beta lactamase (ESBL) producing Escherichia coli 04/21/2016  . Hypotension 04/21/2016  . Gout   . Hypertension     CMP     Component Value Date/Time   NA 141 08/04/2018 1358   NA 145 06/08/2018   NA 145 07/28/2017   K 3.4 (L) 08/04/2018 1358   K 3.7 07/28/2017   CL 102 08/04/2018 1358   CO2 30 08/04/2018 1358   GLUCOSE 97 08/04/2018 1358   BUN 20 08/04/2018 1358   BUN 25 (A) 06/08/2018   CREATININE 0.69 08/04/2018 1358   CREATININE 0.39 07/28/2017   CALCIUM 8.9 08/04/2018 1358   CALCIUM 8.1 07/28/2017    PROT 7.2 08/04/2018 1358   ALBUMIN 2.9 (L) 08/04/2018 1358   AST 17 08/04/2018 1358   ALT 12 08/04/2018 1358   ALKPHOS 82 08/04/2018 1358   BILITOT 0.7 08/04/2018 1358   GFRNONAA >60 08/04/2018 1358   GFRNONAA >90 07/28/2017   GFRAA >60 08/04/2018 1358   Recent Labs    02/16/18 2227  02/20/18 0325 02/21/18 0325  05/29/18 06/08/18 08/04/18 1358  NA  --    < > 147* 146*   < > 143 145 141  K  --    < > 3.7 3.0*   < > 3.8 3.9 3.4*  CL  --    < > 116* 112*  --   --   --  102  CO2  --    < > 24 25  --   --   --  30  GLUCOSE  --    < > 84 87  --   --   --  97  BUN  --    < > 10 8   < > 20 25* 20  CREATININE  --    < > 0.54 0.42*   < > 0.7 0.6 0.69  CALCIUM  --    < > 7.9* 7.6*  --   --   --  8.9  MG 2.1  --   --   --   --   --   --   --    < > = values in this interval not displayed.   Recent Labs    02/20/18 0325 02/21/18 0325 08/04/18 1358  AST 45* 30 17  ALT 44 33 12  ALKPHOS 358* 324* 82  BILITOT 1.9* 1.1 0.7  PROT 5.0* 4.8* 7.2  ALBUMIN 2.1* 1.9* 2.9*   Recent Labs    02/17/18 0435 02/18/18 0308  02/20/18 0325 02/21/18 0325  05/25/18 06/08/18 08/04/18 1358  WBC 19.0* 14.6*   < > 10.8* 7.6   < > 9.4 9.1 11.2*  NEUTROABS 16.7* 13.2*  --   --   --   --   --   --  6.5  HGB 10.1* 12.1   < > 10.4* 10.1*   < > 10.5* 11.7* 11.8*  HCT 34.9* 40.8   < > 35.6* 34.1*   < > 32* 36 40.3  MCV 90.6 87.9   < > 90.4 89.0  --   --   --  88.6  PLT 193 124*   < >  232 192   < > 226 250 293   < > = values in this interval not displayed.   No results for input(s): CHOL, LDLCALC, TRIG in the last 8760 hours.  Invalid input(s): HCL No results found for: MICROALBUR Lab Results  Component Value Date   TSH 1.68 08/06/2013   No results found for: HGBA1C No results found for: CHOL, HDL, LDLCALC, LDLDIRECT, TRIG, CHOLHDL  Significant Diagnostic Results in last 30 days:  Ct Chest Wo Contrast  Result Date: 08/04/2018 CLINICAL DATA:  Confusion and lethargy.  Abnormal chest x-ray.  EXAM: CT CHEST WITHOUT CONTRAST TECHNIQUE: Multidetector CT imaging of the chest was performed following the standard protocol without IV contrast. COMPARISON:  Chest x-ray 08/04/2018 and more remote chest x-ray from 02/16/2018. FINDINGS: Cardiovascular: The heart is normal in size. No pericardial effusion. Mild tortuosity and ectasia of the thoracic aorta with scattered atherosclerotic calcifications mainly at the aortic arch. No significant coronary artery calcifications. Mediastinum/Nodes: Small scattered mediastinal and hilar lymph nodes but no mass or overt adenopathy. The esophagus is grossly normal. Lungs/Pleura: Innumerable bilateral pulmonary nodules of varying sizes consistent with diffuse pulmonary metastatic disease. This is significantly progressive when compared to a prior abdominal CT scan from January 2020. 9.5 mm right upper lobe nodule on image number 59. 9 mm left upper lobe nodule on image number 57. 13.5 mm right middle lobe pulmonary nodule on image number 79. 16.5 mm right lower lobe nodule on image number 70. No infiltrates or effusions. Upper Abdomen: No significant upper abdominal findings. Musculoskeletal: Bilateral mastectomies. No chest wall mass, supraclavicular or axillary adenopathy. The thyroid gland is grossly normal. Osteoporosis and degenerative changes. Remote appearing lower thoracic compression fracture. No obvious bone lesions. Remote right humeral head and neck fractures. IMPRESSION: 1. Diffuse pulmonary metastatic disease of uncertain primary. 2. No mediastinal or hilar mass or adenopathy. 3. No upper abdominal or osseous metastatic disease is identified. Aortic Atherosclerosis (ICD10-I70.0). Electronically Signed   By: Marijo Sanes M.D.   On: 08/04/2018 17:13   Dg Chest Port 1 View  Result Date: 08/04/2018 CLINICAL DATA:  Evaluate for pneumonia. Elevated white blood cell count. Pna? EXAM: PORTABLE CHEST 1 VIEW COMPARISON:  02/16/2018. FINDINGS: Mediastinum and hilar  structures normal. Heart size normal. New appearance of multiple pulmonary nodule densities noted bilaterally. Findings are concerning for metastatic disease. Infectious etiologies are pulmonary nodules cannot be excluded. No pleural effusion or pneumothorax. Surgical clips right axilla. Deformity right humerus again noted without interim change. Diffuse osteopenia. Surgical clips right upper quadrant. IMPRESSION: New appearance of multiple bilateral pulmonary nodules, most worrisome for metastatic disease. Infectious etiologies including granulomatous disease cannot be excluded. Electronically Signed   By: Marcello [facility]  Register   On: 08/04/2018 14:42    Assessment and Plan  Dementia/failure to thrive- both social work and Engineer, structural have spoken to son, he is adamant that his mother should be a full code, meaning he want CPR intubation etc.; patient is failing to thrive and secondary to her TBI I do not think anything will turn this around.  She is had a trial of IV fluids which did not help.  She is eating a little but not much and still drinking.  We have started the 120 cc of water every 2 hours 8 times a day while awake which gives her liter fluid and daily we will continue to monitor; if she codes in the nursing home we will perform a code but is doubtful that it would bring her back;  continue supportive care      [name], MD

## 2018-08-26 DIAGNOSIS — D649 Anemia, unspecified: Secondary | ICD-10-CM | POA: Diagnosis not present

## 2018-08-26 DIAGNOSIS — Z48815 Encounter for surgical aftercare following surgery on the digestive system: Secondary | ICD-10-CM | POA: Diagnosis not present

## 2018-08-26 DIAGNOSIS — R1312 Dysphagia, oropharyngeal phase: Secondary | ICD-10-CM | POA: Diagnosis not present

## 2018-08-26 DIAGNOSIS — I10 Essential (primary) hypertension: Secondary | ICD-10-CM | POA: Diagnosis not present

## 2018-08-26 DIAGNOSIS — M6281 Muscle weakness (generalized): Secondary | ICD-10-CM | POA: Diagnosis not present

## 2018-08-26 LAB — BASIC METABOLIC PANEL
BUN: 14 (ref 4–21)
Creatinine: 0.5 (ref 0.5–1.1)
Glucose: 98
Potassium: 3.9 (ref 3.4–5.3)
Sodium: 142 (ref 137–147)

## 2018-08-27 ENCOUNTER — Encounter: Payer: Self-pay | Admitting: Internal Medicine

## 2018-08-27 DIAGNOSIS — M6281 Muscle weakness (generalized): Secondary | ICD-10-CM | POA: Diagnosis not present

## 2018-08-27 DIAGNOSIS — R1312 Dysphagia, oropharyngeal phase: Secondary | ICD-10-CM | POA: Diagnosis not present

## 2018-08-27 DIAGNOSIS — Z48815 Encounter for surgical aftercare following surgery on the digestive system: Secondary | ICD-10-CM | POA: Diagnosis not present

## 2018-08-28 DIAGNOSIS — M6281 Muscle weakness (generalized): Secondary | ICD-10-CM | POA: Diagnosis not present

## 2018-08-28 DIAGNOSIS — Z48815 Encounter for surgical aftercare following surgery on the digestive system: Secondary | ICD-10-CM | POA: Diagnosis not present

## 2018-08-28 DIAGNOSIS — R1312 Dysphagia, oropharyngeal phase: Secondary | ICD-10-CM | POA: Diagnosis not present

## 2018-08-31 DIAGNOSIS — L89153 Pressure ulcer of sacral region, stage 3: Secondary | ICD-10-CM | POA: Diagnosis not present

## 2018-08-31 DIAGNOSIS — R1312 Dysphagia, oropharyngeal phase: Secondary | ICD-10-CM | POA: Diagnosis not present

## 2018-08-31 DIAGNOSIS — M6281 Muscle weakness (generalized): Secondary | ICD-10-CM | POA: Diagnosis not present

## 2018-08-31 DIAGNOSIS — Z48815 Encounter for surgical aftercare following surgery on the digestive system: Secondary | ICD-10-CM | POA: Diagnosis not present

## 2018-09-01 DIAGNOSIS — M6281 Muscle weakness (generalized): Secondary | ICD-10-CM | POA: Diagnosis not present

## 2018-09-01 DIAGNOSIS — R1312 Dysphagia, oropharyngeal phase: Secondary | ICD-10-CM | POA: Diagnosis not present

## 2018-09-01 DIAGNOSIS — Z48815 Encounter for surgical aftercare following surgery on the digestive system: Secondary | ICD-10-CM | POA: Diagnosis not present

## 2018-09-01 DIAGNOSIS — Z20828 Contact with and (suspected) exposure to other viral communicable diseases: Secondary | ICD-10-CM | POA: Diagnosis not present

## 2018-09-02 DIAGNOSIS — R1312 Dysphagia, oropharyngeal phase: Secondary | ICD-10-CM | POA: Diagnosis not present

## 2018-09-02 DIAGNOSIS — R630 Anorexia: Secondary | ICD-10-CM | POA: Diagnosis not present

## 2018-09-02 DIAGNOSIS — I10 Essential (primary) hypertension: Secondary | ICD-10-CM | POA: Diagnosis not present

## 2018-09-02 DIAGNOSIS — Z48815 Encounter for surgical aftercare following surgery on the digestive system: Secondary | ICD-10-CM | POA: Diagnosis not present

## 2018-09-02 DIAGNOSIS — R4189 Other symptoms and signs involving cognitive functions and awareness: Secondary | ICD-10-CM | POA: Diagnosis not present

## 2018-09-02 DIAGNOSIS — M6281 Muscle weakness (generalized): Secondary | ICD-10-CM | POA: Diagnosis not present

## 2018-09-02 DIAGNOSIS — D649 Anemia, unspecified: Secondary | ICD-10-CM | POA: Diagnosis not present

## 2018-09-02 LAB — BASIC METABOLIC PANEL
BUN: 13 (ref 4–21)
Creatinine: 0.4 — AB (ref 0.5–1.1)
Glucose: 111
Potassium: 3.7 (ref 3.4–5.3)
Sodium: 141 (ref 137–147)

## 2018-09-03 ENCOUNTER — Encounter: Payer: Self-pay | Admitting: Internal Medicine

## 2018-09-03 ENCOUNTER — Other Ambulatory Visit: Payer: Self-pay

## 2018-09-03 ENCOUNTER — Non-Acute Institutional Stay (SKILLED_NURSING_FACILITY): Payer: Medicare Other | Admitting: Internal Medicine

## 2018-09-03 DIAGNOSIS — R1312 Dysphagia, oropharyngeal phase: Secondary | ICD-10-CM | POA: Diagnosis not present

## 2018-09-03 DIAGNOSIS — M6281 Muscle weakness (generalized): Secondary | ICD-10-CM | POA: Diagnosis not present

## 2018-09-03 DIAGNOSIS — Z0000 Encounter for general adult medical examination without abnormal findings: Secondary | ICD-10-CM | POA: Diagnosis not present

## 2018-09-03 DIAGNOSIS — Z48815 Encounter for surgical aftercare following surgery on the digestive system: Secondary | ICD-10-CM | POA: Diagnosis not present

## 2018-09-03 NOTE — Progress Notes (Signed)
Subjective:   Autumn Bryant is an 83 y.o. female who presents for Medicare Annual (Subsequent) preventive examination.  Review of Systems:  Is unobtainable secondary to dementia and patient being a poor historian-       Objective:     Vitals: BP 108/66   Pulse 89   Temp (!) 97.5 F (36.4 C) (Oral)   Resp 18   Ht 5\' 5"  (1.651 m)   Wt 120 lb (54.4 kg)   LMP  (LMP Unknown)   BMI 19.97 kg/m   Body mass index is 19.97 kg/m.  In general this is a frail appearing elderly female she does not appear to be in pain or distress.  Her skin is warm and dry she does have a history of a sacral ulcerand heal lesions which are followed by wound care.--Areas are currently covered.  Eyes visual acuity appears grossly intact sclera and conjunctive are clear.  Oropharynx appears to be relatively clear mucous membranes fairly moist.  Chest is clear to auscultation with somewhat poor respiratory effort.  Heart is regular rate and rhythm without murmur gallop or rub she does not appear to have significant lower extremity edema.  Her abdomen is soft nontender with positive bowel sounds.  GU she does have an indwelling Foley catheter draining amber-colored urine.  Musculoskeletal continues with holding her lower extremities in a somewhat contracted position she does have some movement of her upper extremities with baseline weakness.  Neurologic as noted above her speech is clear cranial nerves appear grossly intact.  Psych she is oriented to self only    Advanced Directives 08/04/2018 05/13/2018 02/20/2018 08/06/2017 07/23/2017 07/19/2017 07/16/2017  Does Patient Have a Medical Advance Directive? No No No No No No No  Would patient like information on creating a medical advance directive? No - Patient declined No - Patient declined No - Patient declined No - Patient declined - No - Patient declined -    Tobacco Social History   Tobacco Use  Smoking Status Never Smoker  Smokeless Tobacco  Never Used     Counseling given: Not Answered   Clinical Intake:  Pre-visit preparation completed: Yes  Pain : 0-10 Pain Type: Chronic pain Pain Orientation: Other (Comment) Pain Descriptors / Indicators: Other (Comment) Pain Onset: More than a month ago Pain Frequency: Occasional Pain Relieving Factors: Patient was recently seen for pain management she is now on Norco 5-3 25 every 12 hours also has PRN Tylenol Effect of Pain on Daily Activities: Patient is nonambulatory at baseline  Pain Relieving Factors: Patient was recently seen for pain management she is now on Norco 5-3 25 every 12 hours also has PRN Tylenol  BMI - recorded: 19.97 Nutritional Risks: Non-healing wound, Failure to thrive Diabetes: No     Interpreter Needed?: No     Past Medical History:  Diagnosis Date  . Anasarca 05/21/2016  . Anxiety 11/03/2016  . Closed TBI (traumatic brain injury) ([facility]) 04/21/2016  . Dysphagia 07/13/2016  . Gout   . Hypertension   . Obesity (BMI 30.0-34.9) 05/21/2016  . Status post insertion of percutaneous endoscopic gastrostomy (PEG) tube ([facility]) 04/21/2016   Past Surgical History:  Procedure Laterality Date  . CHOLECYSTECTOMY N/A 07/21/2017   Procedure: LAPAROSCOPIC CHOLECYSTECTOMY;  Surgeon: Erroll Luna, MD;  Location: [facility];  Service: General;  Laterality: N/A;  . ERCP N/A 02/18/2018   Procedure: ENDOSCOPIC RETROGRADE CHOLANGIOPANCREATOGRAPHY (ERCP);  Surgeon: Milus Banister, MD;  Location: [facility];  Service: Endoscopy;  Laterality: N/A;  .  INTRAMEDULLARY (IM) NAIL INTERTROCHANTERIC Right 12/27/2016   Procedure: INTRAMEDULLARY (IM) NAIL INTERTROCHANTRIC RIGHT HIP;  Surgeon: Paralee Cancel, MD;  Location: [facility];  Service: Orthopedics;  Laterality: Right;  . MASTECTOMY    . REMOVAL OF STONES  02/18/2018   Procedure: REMOVAL OF STONES;  Surgeon: Milus Banister, MD;  Location: [facility];  Service: Endoscopy;;  balloon sweep  . SPHINCTEROTOMY  02/18/2018   Procedure:  SPHINCTEROTOMY;  Surgeon: Milus Banister, MD;  Location: [facility];  Service: Endoscopy;;   Family History  Problem Relation Age of Onset  . Asthma Mother   . Anesthesia problems Neg Hx   . Arthritis Neg Hx   . Cancer Neg Hx   . Cerebral palsy Neg Hx   . Clotting disorder Neg Hx   . Club foot Neg Hx   . Collagen disease Neg Hx   . Deep vein thrombosis Neg Hx   . Gait disorder Neg Hx   . Heart disease Neg Hx   . Gout Neg Hx   . Diabetes Neg Hx   . Hip dysplasia Neg Hx   . Hip fracture Neg Hx   . Hypermobility Neg Hx   . Hypertension Neg Hx   . Osteoporosis Neg Hx   . Other Neg Hx   . Stroke Neg Hx   . Thyroid disease Neg Hx   . Vasculitis Neg Hx   . Spina bifida Neg Hx   . Scoliosis Neg Hx   . Rheumatologic disease Neg Hx   . Pulmonary embolism Neg Hx    Social History   Socioeconomic History  . Marital status: Widowed    Spouse name: Not on file  . Number of children: 1  . Years of education: 11th grade  . Highest education level: Not on file  Occupational History  . Occupation: retired  Scientific laboratory technician  . Financial resource strain: Not hard at all  . Food insecurity    Worry: Never true    Inability: Never true  . Transportation needs    Medical: No    Non-medical: No  Tobacco Use  . Smoking status: Never Smoker  . Smokeless tobacco: Never Used  Substance and Sexual Activity  . Alcohol use: Not Currently  . Drug use: No  . Sexual activity: Never  Lifestyle  . Physical activity    Days per week: 0 days    Minutes per session: 0 min  . Stress: Not at all  Relationships  . Social Herbalist on phone: Not on file    Gets together: Not on file    Attends religious service: Never    Active member of club or organization: No    Attends meetings of clubs or organizations: Never    Relationship status: Widowed  Other Topics Concern  . Not on file  Social History Narrative      Widowed   Never smoked   Alcohol none   Full code     Outpatient Encounter Medications as of 09/03/2018  Medication Sig  . acetaminophen (TYLENOL) 325 MG tablet Take 650 mg by mouth every 6 (six) hours as needed.   . Amino Acids-Protein Hydrolys (FEEDING SUPPLEMENT, PRO-STAT SUGAR FREE 64,) LIQD Take 30 mLs by mouth 2 (two) times daily.   . bimatoprost (LUMIGAN) 0.01 % SOLN Place 1 drop into both eyes at bedtime. For glaucoma  . bisacodyl (DULCOLAX) 10 MG suppository Place 10 mg rectally daily as needed (constipation not relieved by MOM).  Marland Kitchen  Cholecalciferol (VITAMIN D3) 1.25 MG (50000 UT) CAPS Take 1 capsule by mouth once a week.  . ENSURE (ENSURE) Take 237 mLs by mouth 3 (three) times daily. Prefers chocolate if available  . hydrochlorothiazide (HYDRODIURIL) 25 MG tablet Take 12.5 mg by mouth daily. Take 0.5 tablet to = 12.5 mg  . HYDROcodone-acetaminophen (NORCO) 5-325 MG tablet Take 1 tablet by mouth every 12 (twelve) hours.  . levETIRAcetam (KEPPRA) 500 MG tablet Take 500 mg by mouth 2 (two) times daily.   . Magnesium Hydroxide (MILK OF MAGNESIA PO) Take 30 mLs by mouth daily as needed (if no BM in 3 days).  . MULTIPLE VITAMINS-MINERALS PO Take by mouth. TAKE 1 TABLET BY MOUTH ONCE DAILY TO PROMOTE WOUND HEALING  . nutrition supplement, JUVEN, (JUVEN) PACK Take 1 packet by mouth 2 (two) times daily between meals. mix with 4-8oz water to aid in wound healing.  . Nutritional Supplements (NUTRITIONAL SUPPLEMENT PO) Take 1 each by mouth daily. Magic Cup with lunch  . potassium chloride SA (K-DUR,KLOR-CON) 20 MEQ tablet Take 20 mEq by mouth daily.   . sennosides-docusate sodium (SENOKOT-S) 8.6-50 MG tablet Take 2 tablets by mouth 2 (two) times daily as needed for constipation.  . [EXPIRED] sodium chloride flush (NS) 0.9 % SOLN Inject 10 mLs into the vein every 8 (eight) hours.  . Sodium Phosphates (FLEET ENEMA RE) Place 1 each rectally daily as needed (constipation not relieved by bisacodyl suppository).   No facility-administered encounter  medications on file as of 09/03/2018.    Patient continues to be total care with history of traumatic brain injury- as well as history of metastatic cancer   Activities of Daily Living In your present state of health, do you have any difficulty performing the following activities: 02/20/2018 02/16/2018  Hearing? N -  [place]? N -  Comment - -  Difficulty concentrating or making decisions? Y -  Walking or climbing stairs? Y -  Dressing or bathing? Y -  Doing errands, shopping? Tempie Donning  Some recent data might be hidden    [name], [name]    Assessment:   This is a routine wellness examination for Autumn Bryant.  Exercise Activities and Dietary recommendations  At this point continue nutritional support she does have some history of hypernatremia and at times has required additional fluids-there are weekly BMPs done which have shown stability recently.  GOALS  -Are to continue supportive care-this is complicated with her numerous medical issues including traumatic brain injury and diagnosis of metastatic cancer- she does remain under full CODE STATUS per her son's wishes.  She also has a recent history of seizure disorder and continues on Keppra.  Pain management also will have to be monitored she now is on Norco 5-3 25 2  times a day as well as as needed Tylenol.  She also continues on numerous dietary supplementations including EnsureJuven and Protostat Fall Risk Fall Risk  08/06/2017 08/02/2016  Falls in the past year? Yes Yes  Number falls in past yr: - 1  Injury with Fall? Yes Yes  Comment hip fracture R foot   Is the patient's home free of loose throw rugs in walkways, pet beds, electrical cords, etc?   yes      Grab bars in the bathroom? yes      Handrails on the stairs?   does not apply      Adequate lighting?   yes  Depression Screen PHQ 2/9 Scores 08/06/2017 08/02/2016  PHQ - 2 Score 0 0  Patient scored 7  out of 27 on mood disorder scale--lowest number is desired  Cognitive Function  She scored 3 out of 15 on BIMS Assessment    6CIT Screen 08/06/2017 08/02/2016  What Year? 4 points 4 points  What month? 3 points 0 points  What time? 3 points 3 points  Count back from 20 0 points 0 points  Months in reverse 4 points 0 points  Repeat phrase 10 points 10 points  Total Score 24 17    Immunization History  Administered Date(s) Administered  . Influenza-Unspecified 12/19/2016, 11/26/2017  . PPD Test 04/29/2016  . Pneumococcal Conjugate-13 08/06/2016  . Pneumococcal Polysaccharide-23 04/15/2016  . Tdap 03/31/2016    Q  Screening Tests Health Maintenance  Topic Date Due  . DEXA SCAN  07/20/2019 (Originally 10/02/2002)  . INFLUENZA VACCINE  09/05/2018  . TETANUS/TDAP  03/31/2026  . PNA vac Low Risk Adult  Completed    Cancer Screenings: Lung: Low Dose CT Chest recommended if Age 41-80 years, 30 pack-year currently smoking OR have quit w/in 15years. Patient does not qualify. Breast:  Up to date on Mammogram?   Discontinuesd Up to date of Bone Density/Dexa? Discontinued Colorectal: Dicountinued        Plan:   At this point continue supportive care- encourage p.o. intake as much as possible this is complicated with a tendency to have a high sodium- serial labs are drawn once a week.  She also has a recent history of seizures continues on Keppra which appears to be helping.  Pain will have to be monitored currently on Norco as well as PRN Tylenol.  Prognosis remains poor secondary to metastatic cancer complicated with previous history of traumatic brain injury  I have personally reviewed and noted the following in the patient's chart:   . Medical and social history . Use of alcohol, tobacco or illicit drugs  . Current medications and supplements . Functional ability and status . Nutritional status . Physical activity . Advanced directives . List of other physicians .  Hospitalizations, surgeries, and ER visits in previous 12 months . Vitals . Screenings to include cognitive, depression, and falls . Referrals and appointments   Patient is in a long-term care facility and secondary to dementia with traumatic brain injury findings were not able to be discussed with patient     Granville Lewis, PA-C 09/04/2018

## 2018-09-04 ENCOUNTER — Encounter: Payer: Self-pay | Admitting: Internal Medicine

## 2018-09-04 DIAGNOSIS — M6281 Muscle weakness (generalized): Secondary | ICD-10-CM | POA: Diagnosis not present

## 2018-09-04 DIAGNOSIS — R1312 Dysphagia, oropharyngeal phase: Secondary | ICD-10-CM | POA: Diagnosis not present

## 2018-09-04 DIAGNOSIS — Z48815 Encounter for surgical aftercare following surgery on the digestive system: Secondary | ICD-10-CM | POA: Diagnosis not present

## 2018-09-05 DIAGNOSIS — R1312 Dysphagia, oropharyngeal phase: Secondary | ICD-10-CM | POA: Diagnosis not present

## 2018-09-07 DIAGNOSIS — R1312 Dysphagia, oropharyngeal phase: Secondary | ICD-10-CM | POA: Diagnosis not present

## 2018-09-07 DIAGNOSIS — L89153 Pressure ulcer of sacral region, stage 3: Secondary | ICD-10-CM | POA: Diagnosis not present

## 2018-09-08 DIAGNOSIS — Z20828 Contact with and (suspected) exposure to other viral communicable diseases: Secondary | ICD-10-CM | POA: Diagnosis not present

## 2018-09-08 DIAGNOSIS — R1312 Dysphagia, oropharyngeal phase: Secondary | ICD-10-CM | POA: Diagnosis not present

## 2018-09-09 DIAGNOSIS — I10 Essential (primary) hypertension: Secondary | ICD-10-CM | POA: Diagnosis not present

## 2018-09-09 DIAGNOSIS — R1312 Dysphagia, oropharyngeal phase: Secondary | ICD-10-CM | POA: Diagnosis not present

## 2018-09-09 DIAGNOSIS — D649 Anemia, unspecified: Secondary | ICD-10-CM | POA: Diagnosis not present

## 2018-09-09 LAB — BASIC METABOLIC PANEL
BUN: 12 (ref 4–21)
Creatinine: 0.5 (ref 0.5–1.1)
Glucose: 114
Potassium: 4 (ref 3.4–5.3)
Sodium: 141 (ref 137–147)

## 2018-09-10 DIAGNOSIS — R1312 Dysphagia, oropharyngeal phase: Secondary | ICD-10-CM | POA: Diagnosis not present

## 2018-09-11 DIAGNOSIS — R1312 Dysphagia, oropharyngeal phase: Secondary | ICD-10-CM | POA: Diagnosis not present

## 2018-09-14 DIAGNOSIS — R1312 Dysphagia, oropharyngeal phase: Secondary | ICD-10-CM | POA: Diagnosis not present

## 2018-09-14 DIAGNOSIS — L89153 Pressure ulcer of sacral region, stage 3: Secondary | ICD-10-CM | POA: Diagnosis not present

## 2018-09-15 DIAGNOSIS — Z20828 Contact with and (suspected) exposure to other viral communicable diseases: Secondary | ICD-10-CM | POA: Diagnosis not present

## 2018-09-15 DIAGNOSIS — R1312 Dysphagia, oropharyngeal phase: Secondary | ICD-10-CM | POA: Diagnosis not present

## 2018-09-16 DIAGNOSIS — R1312 Dysphagia, oropharyngeal phase: Secondary | ICD-10-CM | POA: Diagnosis not present

## 2018-09-17 ENCOUNTER — Non-Acute Institutional Stay (SKILLED_NURSING_FACILITY): Payer: Medicare Other | Admitting: Internal Medicine

## 2018-09-17 ENCOUNTER — Encounter: Payer: Self-pay | Admitting: Internal Medicine

## 2018-09-17 DIAGNOSIS — G40409 Other generalized epilepsy and epileptic syndromes, not intractable, without status epilepticus: Secondary | ICD-10-CM

## 2018-09-17 DIAGNOSIS — S069X6D Unspecified intracranial injury with loss of consciousness greater than 24 hours without return to pre-existing conscious level with patient surviving, subsequent encounter: Secondary | ICD-10-CM | POA: Diagnosis not present

## 2018-09-17 DIAGNOSIS — R1312 Dysphagia, oropharyngeal phase: Secondary | ICD-10-CM | POA: Diagnosis not present

## 2018-09-17 DIAGNOSIS — E870 Hyperosmolality and hypernatremia: Secondary | ICD-10-CM

## 2018-09-17 DIAGNOSIS — C7800 Secondary malignant neoplasm of unspecified lung: Secondary | ICD-10-CM | POA: Diagnosis not present

## 2018-09-17 DIAGNOSIS — G903 Multi-system degeneration of the autonomic nervous system: Secondary | ICD-10-CM | POA: Diagnosis not present

## 2018-09-17 NOTE — Progress Notes (Signed)
Location:  [facility] Room Number: 206-D Place of Service:  SNF [phone]) Provider:  Granville Lewis, PA-C  [name], MD  [name], [name]  Extended Emergency Contact Information Primary Emergency Contact: [name] Address: [address]          [place], [place] [address] [name] of [place] Phone: [phone] Relation: Son Secondary Emergency Contact: [place], [place] of [place] Phone: [phone] Relation: Sister  Code Status:  Full Code Goals of care: Advanced Directive information Advanced Directives 08/04/2018  Does Patient Have a Medical Advance Directive? No  Would patient like information on creating a medical advance directive? No - Patient declined     Chief Complaint  Patient presents with  . Medical Management of Chronic Issues    Routine [facility] visit  Medical management of chronic medical conditions including metastatic carcinoma-to lung- history of hyponatremia-seizure disorder-traumatic brain injury-polyneuropathy as well as depression hypotension and history of vaginal bleeding  HPI:  Pt is an 83 y.o. female seen today for medical management of chronic diseases.  As noted above.  Nursing staff does not really report any recent acute issues.  She does have a history of hypernatremia and she does get serial BMPs which have shown stability lately.  Sodium on lab last week was 141 update lab is pending-of note she is on low-dose hydrochlorothiazide  At one point she was hypotensive but did respond to IV fluids and her blood pressures appear to be pretty stable most recently 138/66-114/68 103/60.  Patient did have a CT scan done in late June which did show concerning pulmonary nodules --concerning for carcinoma not to be metastatic.  She was thought not to be candidate for any aggressive treatment.   Her son has expressed that he wishes his mother to continue to be full code for now.  She also recently had what appeared to be a grand mal seizure that stopped on its own she has been started on Keppra and apparently this has helped.  In regards to polyneuropathy she continues on Neurontin 300 mg 3 times daily she says her pain comes and goes but appears to be fairly well-controlled at this point she also has orders for Tylenol.  She does have a history of vaginal bleeding however consult for this has been delayed because of coronavirus concerns.  There have been no recent records to my knowledge.  She does have an indwelling Foley catheter this apparently has not really been an issue recently.  It appears her weight has been relatively stable over the past month she is on numerous supplements occluding Ensure Juven and Magic cup.  She also has a history of a sacral wound which is followed by wound care.  Currently she is lying in bed comfortably she is bright and alert fairly conversational although somewhat confused she does not really express any concerns currently she is a poor historian secondary to history of dementia with traumatic brain injury  .     Past Medical History:  Diagnosis Date  . Anasarca 05/21/2016  . Anxiety 11/03/2016  . Closed TBI (traumatic brain injury) ([facility]) 04/21/2016  . Dysphagia 07/13/2016  . Gout   . Hypertension   . Obesity (BMI 30.0-34.9) 05/21/2016  . Status post insertion of percutaneous endoscopic gastrostomy (PEG) tube ([facility]) 04/21/2016   Past Surgical History:  Procedure Laterality Date  . CHOLECYSTECTOMY N/A 07/21/2017   Procedure: LAPAROSCOPIC CHOLECYSTECTOMY;  Surgeon: Erroll Luna, MD;  Location: [facility];  Service: General;  Laterality: N/A;  . ERCP N/A 02/18/2018   Procedure: ENDOSCOPIC RETROGRADE CHOLANGIOPANCREATOGRAPHY (ERCP);  Surgeon: Milus Banister, MD;  Location: [facility];  Service: Endoscopy;  Laterality: N/A;  . INTRAMEDULLARY  (IM) NAIL INTERTROCHANTERIC Right 12/27/2016   Procedure: INTRAMEDULLARY (IM) NAIL INTERTROCHANTRIC RIGHT HIP;  Surgeon: Paralee Cancel, MD;  Location: [facility];  Service: Orthopedics;  Laterality: Right;  . MASTECTOMY    . REMOVAL OF STONES  02/18/2018   Procedure: REMOVAL OF STONES;  Surgeon: Milus Banister, MD;  Location: [facility];  Service: Endoscopy;;  balloon sweep  . SPHINCTEROTOMY  02/18/2018   Procedure: SPHINCTEROTOMY;  Surgeon: Milus Banister, MD;  Location: [facility];  Service: Endoscopy;;    Allergies  Allergen Reactions  . Demerol [Meperidine] Swelling    Outpatient Encounter Medications as of 09/17/2018  Medication Sig  . acetaminophen (TYLENOL) 325 MG tablet Take 650 mg by mouth every 6 (six) hours as needed.   . Amino Acids-Protein Hydrolys (FEEDING SUPPLEMENT, PRO-STAT SUGAR FREE 64,) LIQD Take 30 mLs by mouth 2 (two) times daily.   . bimatoprost (LUMIGAN) 0.01 % SOLN Place 1 drop into both eyes at bedtime. For glaucoma  . bisacodyl (DULCOLAX) 10 MG suppository Place 10 mg rectally daily as needed (constipation not relieved by MOM).  . Cholecalciferol (VITAMIN D3) 1.25 MG (50000 UT) CAPS Take 1 capsule by mouth once a week.  . ENSURE (ENSURE) Take 237 mLs by mouth 3 (three) times daily. Prefers chocolate if available  . escitalopram (LEXAPRO) 5 MG tablet Take 5 mg by mouth daily.  . hydrochlorothiazide (HYDRODIURIL) 25 MG tablet Take 12.5 mg by mouth daily. Take 0.5 tablet to = 12.5 mg  . HYDROcodone-acetaminophen (NORCO) 5-325 MG tablet Take 1 tablet by mouth every 12 (twelve) hours.  . levETIRAcetam (KEPPRA) 500 MG tablet Take 500 mg by mouth 2 (two) times daily.   . Magnesium Hydroxide (MILK OF MAGNESIA PO) Take 30 mLs by mouth daily as needed (if no BM in 3 days).  . MULTIPLE VITAMINS-MINERALS PO Take by mouth. TAKE 1 TABLET BY MOUTH ONCE DAILY TO PROMOTE WOUND HEALING  . nutrition supplement, JUVEN, (JUVEN) PACK Take 1 packet by mouth 2 (two) times daily  between meals. mix with 4-8oz water to aid in wound healing.  . Nutritional Supplements (NUTRITIONAL SUPPLEMENT PO) Take 1 each by mouth daily. Magic Cup with lunch  . potassium chloride SA (K-DUR,KLOR-CON) 20 MEQ tablet Take 20 mEq by mouth daily.   . sennosides-docusate sodium (SENOKOT-S) 8.6-50 MG tablet Take 2 tablets by mouth 2 (two) times daily as needed for constipation.  . Sodium Phosphates (FLEET ENEMA RE) Place 1 each rectally daily as needed (constipation not relieved by bisacodyl suppository).   No facility-administered encounter medications on file as of 09/17/2018.     Review of Systems   This is limited secondary to patient's dementia-but she is not really complaining of any pain at this time shortness of breath cough or discomfort-nursing has not reported any recent acute concerns   Immunization History  Administered Date(s) Administered  . Influenza-Unspecified 12/19/2016, 11/26/2017  . PPD Test 04/29/2016  . Pneumococcal Conjugate-13 08/06/2016  . Pneumococcal Polysaccharide-23 04/15/2016  . Tdap 03/31/2016   Pertinent  Health Maintenance Due  Topic Date Due  . INFLUENZA VACCINE  09/05/2018  . DEXA SCAN  07/20/2019 (Originally 10/02/2002)  .  PNA vac Low Risk Adult  Completed   Fall Risk  08/06/2017 08/02/2016  Falls in the past year? Yes Yes  Number falls in past yr: - 1  Injury with Fall? Yes Yes  Comment hip fracture R foot   Functional Status Survey:    Vitals:   09/17/18 0955  BP: 138/66  Pulse: 85  Resp: 18  Temp: 98 F (36.7 C)  TempSrc: Oral  Weight: 120 lb 1.6 oz (54.5 kg)  Height: 5\' 5"  (1.651 m)   Body mass index is 19.99 kg/m. Physical Exam   In general this is a pleasant elderly female in no distress she is actually quite bright and alert today.  Her skin is warm and dry she does have a sacral wound which is followed by wound care was not assessed secondary to patient positioning.  Eyes visual acuity appears to be intact sclera and  conjunctive are clear.  Oropharynx appears to be clear mucous membranes are fairly moist.  Chest is clear to auscultation there is no labored breathing.  Heart is regular rate and rhythm without murmur gallop or rub she does not really appear to have significant lower extremity edema.  Her abdomen is soft nontender with positive bowel sounds.  GU she has an indwelling Foley catheter draining amber-colored urine output appears to be adequate.  Musculoskeletal he does have protective boots on her feet are currently wrapped she has significant generalized weakness  Neurologic she is alert cranial nerves appear to be intact her speech is clear  Psych she is oriented to self she is pleasant and appropriate does follow simple verbal commands-was able to tell me her birthdate and that she was born in [place]-could not really name the president-said it was Tawni Pummel- could not really tell me the year.   Labs reviewed:  September 09, 2018.  Sodium 141 potassium 4 BUN 11.7 creatinine 0.45.  September 02, 2018.  Sodium 141 potassium 3.7 BUN 12.8 creatinine 0.41   Recent Labs    02/16/18 2227  02/20/18 0325 02/21/18 0325  08/04/18 1358  08/19/18 08/26/18 09/02/18  NA  --    < > 147* 146*   < > 141   < > 142 142 141  K  --    < > 3.7 3.0*   < > 3.4*   < > 3.5 3.9 3.7  CL  --    < > 116* 112*  --  102  --   --   --   --   CO2  --    < > 24 25  --  30  --   --   --   --   GLUCOSE  --    < > 84 87  --  97  --   --   --   --   BUN  --    < > 10 8   < > 20   < > 7 14 13   CREATININE  --    < > 0.54 0.42*   < > 0.69   < > 0.3* 0.5 0.4*  CALCIUM  --    < > 7.9* 7.6*  --  8.9  --   --   --   --   MG 2.1  --   --   --   --   --   --   --   --   --    < > = values in this interval not displayed.  Recent Labs    02/20/18 0325 02/21/18 0325 08/04/18 1358  AST 45* 30 17  ALT 44 33 12  ALKPHOS 358* 324* 82  BILITOT 1.9* 1.1 0.7  PROT 5.0* 4.8* 7.2  ALBUMIN 2.1* 1.9* 2.9*   Recent Labs     02/18/18 0308  02/20/18 0325 02/21/18 0325  06/08/18 08/04/18 1358 08/10/18  WBC 14.6*   < > 10.8* 7.6   < > 9.1 11.2* 11.1  NEUTROABS 13.2*  --   --   --   --   --  6.5 7  HGB 12.1   < > 10.4* 10.1*   < > 11.7* 11.8* 12.6  HCT 40.8   < > 35.6* 34.1*   < > 36 40.3 39  MCV 87.9   < > 90.4 89.0  --   --  88.6  --   PLT 124*   < > 232 192   < > 250 293 244   < > = values in this interval not displayed.   Lab Results  Component Value Date   TSH 1.68 08/06/2013   No results found for: HGBA1C No results found for: CHOL, HDL, LDLCALC, LDLDIRECT, TRIG, CHOLHDL  Significant Diagnostic Results in last 30 days:  No results found.  Assessment/Plan  #1 history of cancer of unknown origin with metastasis to lungs- again this was evaluated in hospital she was thought not to be a candidate for any aggressive interventions- she has returned to the facility she remains a full code per her son's wishes at this point will monitor at this point she appears to be comfortable.  2.  History of hypernatremia this is been a recurrent issue but appears to have stabilized-she has needed IV fluids at times- sodium has shown stability it was 141 on August 5 lab- this will be updated.  3 history of hypertension again this appears to have largely resolved but will need to be monitored blood pressures as noted above appear to be fairly stable --she is on low-dose hydrochlorothiazide as I suspect this to help with her sodium issues  #4- history of seizure disorder this appears to have stabilized on the Keppra 500 mg twice daily at this point will monitor apparently initially she did have what appeared to be a grand mal seizure.  5.  History of traumatic brain injury at this point continue supportive care as he does have significant cognitive deficits but at this point appears to be doing fairly well with supportive care.  6.  History of vaginal bleeding-obtaining consult has been difficult because of coronavirus  concerns- at this point continue to monitor will update a CBC to make sure hemoglobin remains stable.  7.  History of depression she continues on Lexapro she actually appeared to be in good spirits today a bit more talkative than I have seen her lately at this point will monitor.  [id number]

## 2018-09-18 ENCOUNTER — Encounter: Payer: Self-pay | Admitting: Internal Medicine

## 2018-09-18 ENCOUNTER — Non-Acute Institutional Stay (SKILLED_NURSING_FACILITY): Payer: Medicare Other | Admitting: Internal Medicine

## 2018-09-18 DIAGNOSIS — I10 Essential (primary) hypertension: Secondary | ICD-10-CM | POA: Diagnosis not present

## 2018-09-18 DIAGNOSIS — F0280 Dementia in other diseases classified elsewhere without behavioral disturbance: Secondary | ICD-10-CM

## 2018-09-18 DIAGNOSIS — D649 Anemia, unspecified: Secondary | ICD-10-CM | POA: Diagnosis not present

## 2018-09-18 DIAGNOSIS — E870 Hyperosmolality and hypernatremia: Secondary | ICD-10-CM | POA: Diagnosis not present

## 2018-09-18 DIAGNOSIS — R1312 Dysphagia, oropharyngeal phase: Secondary | ICD-10-CM | POA: Diagnosis not present

## 2018-09-18 LAB — BASIC METABOLIC PANEL
BUN: 14 (ref 4–21)
Creatinine: 0.4 — AB (ref 0.5–1.1)
Glucose: 98
Potassium: 3.6 (ref 3.4–5.3)
Sodium: 141 (ref 137–147)

## 2018-09-18 LAB — CBC AND DIFFERENTIAL
HCT: 38 (ref 36–46)
Hemoglobin: 12.9 (ref 12.0–16.0)
Neutrophils Absolute: 6
Platelets: 268 (ref 150–399)
WBC: 8.8

## 2018-09-18 LAB — HEPATIC FUNCTION PANEL
ALT: 5 — AB (ref 7–35)
AST: 14 (ref 13–35)
Alkaline Phosphatase: 90 (ref 25–125)
Bilirubin, Total: 0.3

## 2018-09-18 NOTE — Progress Notes (Signed)
Location:  [facility] Room Number: 206-D Place of Service:  SNF (31)  [name], MD  [name], [name]  Extended Emergency Contact Information Primary Emergency Contact: [name] Address: [address]          [place], [place] [address] [name] of [place] Phone: [phone] Relation: Son Secondary Emergency Contact: [place], [place] of [place] Phone: [phone] Relation: Sister    Allergies: Demerol [meperidine]  Chief Complaint  Patient presents with  . Acute Visit    Patient is seen for failure to thrive.    HPI: Patient is an 83 y.o. female who has been continuing to fail to thrive and checking up on her to see how far it may have gone.  Patient did respond to my voice alone, patient is on O2 2 L nasal cannula patient did not speak.  Overall she is less responsive than she has been over the last 2 months.  Past Medical History:  Diagnosis Date  . Anasarca 05/21/2016  . Anxiety 11/03/2016  . Closed TBI (traumatic brain injury) ([facility]) 04/21/2016  . Dysphagia 07/13/2016  . Gout   . Hypertension   . Obesity (BMI 30.0-34.9) 05/21/2016  . Status post insertion of percutaneous endoscopic gastrostomy (PEG) tube ([facility]) 04/21/2016    Past Surgical History:  Procedure Laterality Date  . CHOLECYSTECTOMY N/A 07/21/2017   Procedure: LAPAROSCOPIC CHOLECYSTECTOMY;  Surgeon: Erroll Luna, MD;  Location: [facility];  Service: General;  Laterality: N/A;  . ERCP N/A 02/18/2018   Procedure: ENDOSCOPIC RETROGRADE CHOLANGIOPANCREATOGRAPHY (ERCP);  Surgeon: Milus Banister, MD;  Location: [facility];  Service: Endoscopy;  Laterality: N/A;  . INTRAMEDULLARY (IM) NAIL INTERTROCHANTERIC Right 12/27/2016   Procedure: INTRAMEDULLARY (IM) NAIL INTERTROCHANTRIC RIGHT HIP;  Surgeon: Paralee Cancel, MD;  Location: [facility];  Service: Orthopedics;   Laterality: Right;  . MASTECTOMY    . REMOVAL OF STONES  02/18/2018   Procedure: REMOVAL OF STONES;  Surgeon: Milus Banister, MD;  Location: [facility];  Service: Endoscopy;;  balloon sweep  . SPHINCTEROTOMY  02/18/2018   Procedure: SPHINCTEROTOMY;  Surgeon: Milus Banister, MD;  Location: [facility];  Service: Endoscopy;;    Allergies as of 09/18/2018      Reactions   Demerol [meperidine] Swelling      Medication List       Accurate as of September 18, 2018  2:47 PM. If you have any questions, ask your nurse or doctor.        acetaminophen 325 MG tablet Commonly known as: TYLENOL Take 650 mg by mouth every 6 (six) hours as needed.   bimatoprost 0.01 % Soln Commonly known as: LUMIGAN Place 1 drop into both eyes at bedtime. For glaucoma   bisacodyl 10 MG suppository Commonly known as: DULCOLAX Place 10 mg rectally daily as needed (constipation not relieved by MOM).   Ensure Take 237 mLs by mouth 3 (three) times daily. Prefers chocolate if available   nutrition supplement (JUVEN) Pack Take 1 packet by mouth 2 (two) times daily between meals. mix with 4-8oz water to aid in wound healing.   NUTRITIONAL SUPPLEMENT PO Take 1 each by mouth daily. Magic Cup with lunch   escitalopram 5 MG tablet Commonly known as: LEXAPRO Take 5 mg by mouth daily.   feeding supplement (PRO-STAT SUGAR FREE 64) Liqd Take 30 mLs by  mouth 2 (two) times daily.   FLEET ENEMA RE Place 1 each rectally daily as needed (constipation not relieved by bisacodyl suppository).   hydrochlorothiazide 25 MG tablet Commonly known as: HYDRODIURIL Take 12.5 mg by mouth daily. Take 0.5 tablet to = 12.5 mg   HYDROcodone-acetaminophen 5-325 MG tablet Commonly known as: Norco Take 1 tablet by mouth every 12 (twelve) hours.   levETIRAcetam 500 MG tablet Commonly known as: KEPPRA Take 500 mg by mouth 2 (two) times daily.   MILK OF MAGNESIA PO Take 30 mLs by mouth daily as needed (if no BM in 3 days).    MULTIPLE VITAMINS-MINERALS PO Take 1 tablet by mouth daily.   potassium chloride SA 20 MEQ tablet Commonly known as: K-DUR Take 20 mEq by mouth daily.   sennosides-docusate sodium 8.6-50 MG tablet Commonly known as: SENOKOT-S Take 2 tablets by mouth 2 (two) times daily as needed for constipation.   Vitamin D3 1.25 MG (50000 UT) Caps Take 1 capsule by mouth once a week.       No orders of the defined types were placed in this encounter.   Immunization History  Administered Date(s) Administered  . Influenza-Unspecified 12/19/2016, 11/26/2017  . PPD Test 04/29/2016  . Pneumococcal Conjugate-13 08/06/2016  . Pneumococcal Polysaccharide-23 04/15/2016  . Tdap 03/31/2016    Social History   Tobacco Use  . Smoking status: Never Smoker  . Smokeless tobacco: Never Used  Substance Use Topics  . Alcohol use: Not Currently    Review of Systems     unable to obtain secondary to dementia    Vitals:   09/18/18 1437  BP: 117/78  Pulse: 91  Resp: 18  Temp: 97.7 F (36.5 C)   Body mass index is 19.94 kg/m. Physical Exam  GENERAL APPEARANCE: Sleepy, non-conversant, No acute distress, opened eyes to voice and look at me SKIN: No diaphoresis rash HEENT: Unremarkable RESPIRATORY: Breathing is even, unlabored. Lung sounds are clear; O2 2 L nasal cannula CARDIOVASCULAR: Heart RRR no murmurs, rubs or gallops. 1+ peripheral edema  GASTROINTESTINAL: Abdomen is soft, non-tender, not distended w/ normal bowel sounds.  GENITOURINARY: Bladder non tender, not distended  MUSCULOSKELETAL: No abnormal joints or musculature NEUROLOGIC: Cranial nerves 2-12 grossly intact. Moves all extremities PSYCHIATRIC: Dementia, no behavioral issues  Patient Active Problem List   Diagnosis Date Noted  . Grand mal seizure ([facility]) 08/16/2018  . Elevated WBC count 08/08/2018  . Abnormal chest x-ray 08/08/2018  . Metastatic cancer to lung of unknown cell type ([facility]) 08/08/2018  . Hypernatremia  05/26/2018  . HCAP (healthcare-associated pneumonia) 05/18/2018  . Acute renal failure (ARF) ([facility]) 05/07/2018  . MRSA (methicillin resistant Staphylococcus aureus) septicemia ([facility]) 02/23/2018  . Hypokalemia 02/23/2018  . Bile duct stone   . E. coli sepsis ([facility]) 02/17/2018  . Cholecystitis 07/31/2017  . Cholecystitis, acute with cholelithiasis 07/20/2017  . Pressure injury of skin 07/20/2017  . History of total right hip arthroplasty 01/05/2017  . Acute blood loss as cause of postoperative anemia 01/05/2017  . Escherichia coli urinary tract infection 01/05/2017  . Acute encephalopathy 01/05/2017  . Depression, recurrent ([facility]) 01/05/2017  . Polyneuropathy 01/05/2017  . Closed right hip fracture ([facility]) 12/25/2016  . Anxiety 11/03/2016  . Dysphagia 07/13/2016  . Anasarca 05/21/2016  . Obesity (BMI 30.0-34.9) 05/21/2016  . Closed TBI (traumatic brain injury) ([facility]) 04/21/2016  . Status post insertion of percutaneous endoscopic gastrostomy (PEG) tube ([facility]) 04/21/2016  . Facial laceration 04/21/2016  . Cheek wound, right, subsequent encounter 04/21/2016  .  Urinary tract infection due to extended-spectrum beta lactamase (ESBL) producing Escherichia coli 04/21/2016  . Hypotension 04/21/2016  . Gout   . Hypertension     CMP     Component Value Date/Time   NA 141 09/09/2018   NA 145 07/28/2017   K 4.0 09/09/2018   K 3.7 07/28/2017   CL 102 08/04/2018 1358   CO2 30 08/04/2018 1358   GLUCOSE 97 08/04/2018 1358   BUN 12 09/09/2018   CREATININE 0.5 09/09/2018   CREATININE 0.69 08/04/2018 1358   CREATININE 0.39 07/28/2017   CALCIUM 8.9 08/04/2018 1358   CALCIUM 8.1 07/28/2017   PROT 7.2 08/04/2018 1358   ALBUMIN 2.9 (L) 08/04/2018 1358   AST 17 08/04/2018 1358   ALT 12 08/04/2018 1358   ALKPHOS 82 08/04/2018 1358   BILITOT 0.7 08/04/2018 1358   GFRNONAA >60 08/04/2018 1358   GFRNONAA >90 07/28/2017   GFRAA >60 08/04/2018 1358   Recent Labs    02/16/18 2227  02/20/18 0325  02/21/18 0325  08/04/18 1358  08/26/18 09/02/18 09/09/18  NA  --    < > 147* 146*   < > 141   < > 142 141 141  K  --    < > 3.7 3.0*   < > 3.4*   < > 3.9 3.7 4.0  CL  --    < > 116* 112*  --  102  --   --   --   --   CO2  --    < > 24 25  --  30  --   --   --   --   GLUCOSE  --    < > 84 87  --  97  --   --   --   --   BUN  --    < > 10 8   < > 20   < > 14 13 12   CREATININE  --    < > 0.54 0.42*   < > 0.69   < > 0.5 0.4* 0.5  CALCIUM  --    < > 7.9* 7.6*  --  8.9  --   --   --   --   MG 2.1  --   --   --   --   --   --   --   --   --    < > = values in this interval not displayed.   Recent Labs    02/20/18 0325 02/21/18 0325 08/04/18 1358  AST 45* 30 17  ALT 44 33 12  ALKPHOS 358* 324* 82  BILITOT 1.9* 1.1 0.7  PROT 5.0* 4.8* 7.2  ALBUMIN 2.1* 1.9* 2.9*   Recent Labs    02/18/18 0308  02/20/18 0325 02/21/18 0325  06/08/18 08/04/18 1358 08/10/18  WBC 14.6*   < > 10.8* 7.6   < > 9.1 11.2* 11.1  NEUTROABS 13.2*  --   --   --   --   --  6.5 7  HGB 12.1   < > 10.4* 10.1*   < > 11.7* 11.8* 12.6  HCT 40.8   < > 35.6* 34.1*   < > 36 40.3 39  MCV 87.9   < > 90.4 89.0  --   --  88.6  --   PLT 124*   < > 232 192   < > 250 293 244   < > = values in this interval not displayed.  No results for input(s): CHOL, LDLCALC, TRIG in the last 8760 hours.  Invalid input(s): HCL No results found for: MICROALBUR Lab Results  Component Value Date   TSH 1.68 08/06/2013   No results found for: HGBA1C No results found for: CHOL, HDL, LDLCALC, LDLDIRECT, TRIG, CHOLHDL  Significant Diagnostic Results in last 30 days:  No results found.  Assessment and Plan  Failure to thrive/dementia- patient is continuing to deteriorate; son is aware per nurses; continue supportive care    [name], MD

## 2018-09-20 ENCOUNTER — Encounter: Payer: Self-pay | Admitting: Internal Medicine

## 2018-09-21 DIAGNOSIS — R1312 Dysphagia, oropharyngeal phase: Secondary | ICD-10-CM | POA: Diagnosis not present

## 2018-09-21 DIAGNOSIS — L89153 Pressure ulcer of sacral region, stage 3: Secondary | ICD-10-CM | POA: Diagnosis not present

## 2018-09-22 DIAGNOSIS — R1312 Dysphagia, oropharyngeal phase: Secondary | ICD-10-CM | POA: Diagnosis not present

## 2018-09-22 DIAGNOSIS — Z20828 Contact with and (suspected) exposure to other viral communicable diseases: Secondary | ICD-10-CM | POA: Diagnosis not present

## 2018-09-23 ENCOUNTER — Other Ambulatory Visit: Payer: Self-pay | Admitting: Internal Medicine

## 2018-09-23 DIAGNOSIS — R1312 Dysphagia, oropharyngeal phase: Secondary | ICD-10-CM | POA: Diagnosis not present

## 2018-09-23 MED ORDER — HYDROCODONE-ACETAMINOPHEN 5-325 MG PO TABS: 1.0000 | ORAL_TABLET | Freq: Two times a day (BID) | ORAL | 0 refills | Status: DC

## 2018-09-24 DIAGNOSIS — R1312 Dysphagia, oropharyngeal phase: Secondary | ICD-10-CM | POA: Diagnosis not present

## 2018-09-25 DIAGNOSIS — D649 Anemia, unspecified: Secondary | ICD-10-CM | POA: Diagnosis not present

## 2018-09-25 DIAGNOSIS — R1312 Dysphagia, oropharyngeal phase: Secondary | ICD-10-CM | POA: Diagnosis not present

## 2018-09-25 DIAGNOSIS — I10 Essential (primary) hypertension: Secondary | ICD-10-CM | POA: Diagnosis not present

## 2018-09-25 LAB — BASIC METABOLIC PANEL
BUN: 15 (ref 4–21)
Creatinine: 0.5 (ref 0.5–1.1)
Glucose: 94
Potassium: 3.7 (ref 3.4–5.3)
Sodium: 138 (ref 137–147)

## 2018-09-28 DIAGNOSIS — R1312 Dysphagia, oropharyngeal phase: Secondary | ICD-10-CM | POA: Diagnosis not present

## 2018-09-28 DIAGNOSIS — L89153 Pressure ulcer of sacral region, stage 3: Secondary | ICD-10-CM | POA: Diagnosis not present

## 2018-09-29 DIAGNOSIS — R1312 Dysphagia, oropharyngeal phase: Secondary | ICD-10-CM | POA: Diagnosis not present

## 2018-09-29 DIAGNOSIS — Z20828 Contact with and (suspected) exposure to other viral communicable diseases: Secondary | ICD-10-CM | POA: Diagnosis not present

## 2018-09-30 DIAGNOSIS — R1312 Dysphagia, oropharyngeal phase: Secondary | ICD-10-CM | POA: Diagnosis not present

## 2018-10-02 DIAGNOSIS — D649 Anemia, unspecified: Secondary | ICD-10-CM | POA: Diagnosis not present

## 2018-10-02 DIAGNOSIS — I10 Essential (primary) hypertension: Secondary | ICD-10-CM | POA: Diagnosis not present

## 2018-10-02 LAB — BASIC METABOLIC PANEL
BUN: 15 (ref 4–21)
Creatinine: 0.5 (ref 0.5–1.1)
Glucose: 84
Potassium: 3.9 (ref 3.4–5.3)
Sodium: 142 (ref 137–147)

## 2018-10-05 DIAGNOSIS — Z20828 Contact with and (suspected) exposure to other viral communicable diseases: Secondary | ICD-10-CM | POA: Diagnosis not present

## 2018-10-05 DIAGNOSIS — L89153 Pressure ulcer of sacral region, stage 3: Secondary | ICD-10-CM | POA: Diagnosis not present

## 2018-10-06 DIAGNOSIS — R609 Edema, unspecified: Secondary | ICD-10-CM | POA: Diagnosis not present

## 2018-10-06 DIAGNOSIS — Z136 Encounter for screening for cardiovascular disorders: Secondary | ICD-10-CM | POA: Diagnosis not present

## 2018-10-06 DIAGNOSIS — M79604 Pain in right leg: Secondary | ICD-10-CM | POA: Diagnosis not present

## 2018-10-07 ENCOUNTER — Non-Acute Institutional Stay (SKILLED_NURSING_FACILITY): Payer: Medicare Other | Admitting: Internal Medicine

## 2018-10-07 ENCOUNTER — Encounter: Payer: Self-pay | Admitting: Internal Medicine

## 2018-10-07 DIAGNOSIS — E870 Hyperosmolality and hypernatremia: Secondary | ICD-10-CM | POA: Diagnosis not present

## 2018-10-07 DIAGNOSIS — M79605 Pain in left leg: Secondary | ICD-10-CM

## 2018-10-07 DIAGNOSIS — M79604 Pain in right leg: Secondary | ICD-10-CM | POA: Diagnosis not present

## 2018-10-07 DIAGNOSIS — Z8742 Personal history of other diseases of the female genital tract: Secondary | ICD-10-CM | POA: Diagnosis not present

## 2018-10-07 NOTE — Progress Notes (Signed)
Location:  [facility] Service:  SNF [phone]) Provider:  Henreitta Leber, MD  [name], [name]  Extended Emergency Contact Information Primary Emergency Contact: [name] Address: [address]          [place], [place] [address] [name] of [place] Phone: [phone] Relation: Son Secondary Emergency Contact: [place], [place] of [place] Phone: [phone] Relation: Sister  Goals of care: Advanced Directive information Advanced Directives 08/04/2018  Does Patient Have a Medical Advance Directive? No  Would patient like information on creating a medical advance directive? No - Patient declined     Chief Complaint  Patient presents with  . Acute Visit    Patient is seen for leg pain.    HPI:  Pt is a 83 y.o. female seen today for an acute visit for  complaints of leg pain.  He is a long-term resident of facility with a history of metastatic carcinoma to the lung as well as history of hypernatremia seizure disorder traumatic brain injuryPolyneuropathy depression hypertension and history of vaginal bleeding He also has a history of a sacral wound.  She has significant debility and is largely bedbound-apparently she has had some increased complaints of leg pain recently and it was noted she may have had some swelling and tenderness to the right calf yesterday so venous Doppler was obtained-this has come back negative for DVT.  When I saw her this afternoon she was resting comfortably and says she was not having pain but per nursing she does have significant pain especially staff attempts to move her during routine care.  She does have an order for Norco 5 325 mg every 12 hours as well as PRN Tylenol but unclear how often she actually receives the Tylenol and staff feels if it is made routine this would be helpful.       Past Medical  History:  Diagnosis Date  . Anasarca 05/21/2016  . Anxiety 11/03/2016  . Closed TBI (traumatic brain injury) ([facility]) 04/21/2016  . Dysphagia 07/13/2016  . Gout   . Hypertension   . Obesity (BMI 30.0-34.9) 05/21/2016  . Status post insertion of percutaneous endoscopic gastrostomy (PEG) tube ([facility]) 04/21/2016   Past Surgical History:  Procedure Laterality Date  . CHOLECYSTECTOMY N/A 07/21/2017   Procedure: LAPAROSCOPIC CHOLECYSTECTOMY;  Surgeon: Erroll Luna, MD;  Location: [facility];  Service: General;  Laterality: N/A;  . ERCP N/A 02/18/2018   Procedure: ENDOSCOPIC RETROGRADE CHOLANGIOPANCREATOGRAPHY (ERCP);  Surgeon: Milus Banister, MD;  Location: [facility];  Service: Endoscopy;  Laterality: N/A;  . INTRAMEDULLARY (IM) NAIL INTERTROCHANTERIC Right 12/27/2016   Procedure: INTRAMEDULLARY (IM) NAIL INTERTROCHANTRIC RIGHT HIP;  Surgeon: Paralee Cancel, MD;  [facility];  Service: Orthopedics;  Laterality: Right;  . MASTECTOMY    . REMOVAL OF STONES  02/18/2018   Procedure: REMOVAL OF STONES;  Surgeon: Milus Banister, MD;  [facility];  Service: Endoscopy;;  balloon sweep  . SPHINCTEROTOMY  02/18/2018   Procedure: SPHINCTEROTOMY;  Surgeon: Milus Banister, MD;  Location: [facility];  Service: Endoscopy;;    Allergies  Allergen Reactions  . Demerol [Meperidine] Swelling    Outpatient Encounter Medications as of 10/07/2018  Medication Sig  . acetaminophen (TYLENOL) 325 MG tablet Take 650 mg by mouth every 6 (six) hours as needed.   . Amino Acids-Protein Hydrolys (FEEDING  SUPPLEMENT, PRO-STAT SUGAR FREE 64,) LIQD Take 30 mLs by mouth 2 (two) times daily.   . bimatoprost (LUMIGAN) 0.01 % SOLN Place 1 drop into both eyes at bedtime. For glaucoma  . bisacodyl (DULCOLAX) 10 MG suppository Place 10 mg rectally daily as needed (constipation not relieved by MOM).  . Cholecalciferol (VITAMIN D3) 1.25 MG (50000 UT) CAPS Take 1 capsule by mouth once a week.  . ENSURE (ENSURE) Take 237  mLs by mouth 3 (three) times daily. Prefers chocolate if available  . escitalopram (LEXAPRO) 5 MG tablet Take 5 mg by mouth daily.  . hydrochlorothiazide (HYDRODIURIL) 25 MG tablet Take 12.5 mg by mouth daily. Take 0.5 tablet to = 12.5 mg  . HYDROcodone-acetaminophen (NORCO) 5-325 MG tablet Take 1 tablet by mouth every 12 (twelve) hours.  . levETIRAcetam (KEPPRA) 500 MG tablet Take 500 mg by mouth 2 (two) times daily.   . Magnesium Hydroxide (MILK OF MAGNESIA PO) Take 30 mLs by mouth daily as needed (if no BM in 3 days).  . MULTIPLE VITAMINS-MINERALS PO Take 1 tablet by mouth daily.   . nutrition supplement, JUVEN, (JUVEN) PACK Take 1 packet by mouth 2 (two) times daily between meals. mix with 4-8oz water to aid in wound healing.  . Nutritional Supplements (NUTRITIONAL SUPPLEMENT PO) Take 1 each by mouth daily. Magic Cup with lunch  . potassium chloride SA (K-DUR,KLOR-CON) 20 MEQ tablet Take 20 mEq by mouth daily.   . sennosides-docusate sodium (SENOKOT-S) 8.6-50 MG tablet Take 2 tablets by mouth 2 (two) times daily as needed for constipation.  . Sodium Phosphates (FLEET ENEMA RE) Place 1 each rectally daily as needed (constipation not relieved by bisacodyl suppository).   No facility-administered encounter medications on file as of 10/07/2018.     Review of Systems   This is limited secondary to dementia she is not really complaining of pain currently but apparently does have significant discomfort when she is moved.    Immunization History  Administered Date(s) Administered  . Influenza-Unspecified 12/19/2016, 11/26/2017  . PPD Test 04/29/2016  . Pneumococcal Conjugate-13 08/06/2016  . Pneumococcal Polysaccharide-23 04/15/2016  . Tdap 03/31/2016   Pertinent  Health Maintenance Due  Topic Date Due  . INFLUENZA VACCINE  09/05/2018  . DEXA SCAN  07/20/2019 (Originally 10/02/2002)  . PNA vac Low Risk Adult  Completed   Fall Risk  08/06/2017 08/02/2016  Falls in the past year? Yes Yes   Number falls in past yr: - 1  Injury with Fall? Yes Yes  Comment hip fracture R foot   Functional Status Survey:    Temperature is 98.1 pulse is 80 respirations 18 blood pressure 140/76.   Physical Exam   General this is a pleasant elderly female in no distress she is lying comfortably in bed she is sleeping but easily arousable.  Her skin is warm and dry she does have a sacral wound which was not assessed today because of patient positioning.  Eyes visual acuity appears grossly intact sclera and conjunctive are clear.  Oropharynx appears to be fairly clear mucous membranes moist.  Chest is clear to auscultation with somewhat poor respiratory effort there is no labored breathing.  Heart is regular rate and rhythm with occasional irregular beat-she does not really have significant lower extremity edema today.  Her abdomen is soft nontender with positive bowel sounds.  GU She  has an indwelling Foley catheter draining amber-colored urine.-  I could not appreciate any discharge from the vaginal area  Musculoskeletal does have  protective boots on bilaterally she has significant generalized weakness but I did not note any increased erythema or significantly increased edema of her lower extremities.  Neurologic she is alert cranial nerves appear to be intact her speech is clear but she is not speaking a whole lot today.  Psych she is oriented to self she is pleasant and appropriate.  Labs.      Labs reviewed: Recent Labs    02/16/18 2227  02/20/18 0325 02/21/18 0325  08/04/18 1358  09/18/18 09/25/18 10/02/18  NA  --    < > 147* 146*   < > 141   < > 141 138 142  K  --    < > 3.7 3.0*   < > 3.4*   < > 3.6 3.7 3.9  CL  --    < > 116* 112*  --  102  --   --   --   --   CO2  --    < > 24 25  --  30  --   --   --   --   GLUCOSE  --    < > 84 87  --  97  --   --   --   --   BUN  --    < > 10 8   < > 20   < > 14 15 15   CREATININE  --    < > 0.54 0.42*   < > 0.69   < > 0.4*  0.5 0.5  CALCIUM  --    < > 7.9* 7.6*  --  8.9  --   --   --   --   MG 2.1  --   --   --   --   --   --   --   --   --    < > = values in this interval not displayed.   Recent Labs    02/20/18 0325 02/21/18 0325 08/04/18 1358 09/18/18  AST 45* 30 17 14   ALT 44 33 12 5*  ALKPHOS 358* 324* 82 90  BILITOT 1.9* 1.1 0.7  --   PROT 5.0* 4.8* 7.2  --   ALBUMIN 2.1* 1.9* 2.9*  --    Recent Labs    02/20/18 0325 02/21/18 0325  08/04/18 1358 08/10/18 09/18/18  WBC 10.8* 7.6   < > 11.2* 11.1 8.8  NEUTROABS  --   --   --  6.5 7 6   HGB 10.4* 10.1*   < > 11.8* 12.6 12.9  HCT 35.6* 34.1*   < > 40.3 39 38  MCV 90.4 89.0  --  88.6  --   --   PLT 232 192   < > 293 244 268   < > = values in this interval not displayed.   Lab Results  Component Value Date   TSH 1.68 08/06/2013   No results found for: HGBA1C No results found for: CHOL, HDL, LDLCALC, LDLDIRECT, TRIG, CHOLHDL  Significant Diagnostic Results in last 30 days:  No results found.  Assessment/Plan   #1-history of leg pain again Doppler was reassuring negative for DVT right leg-  Appears fairly baseline today but per staff she does have significant pain with any invasive maneuver to help her-will make Tylenol routine 650 mg every 8 hours- continue Norco every 12 hours routinely--could not really order Tylenol more frequently because that would put [place] over 3 g in a 24-hour period.  Continue to  monitor she appears comfortable currently  #2 history of hypernatremia this has actually been stable now for some time she does get weekly BMPs last sodium was 142 on August 28 she will receive an updated BMP to ensure stability   #3history of vaginal bleeding apparently per staff she does have this intermittently consult has been delayed apparently because of coronavirus concerns- per discussion with Dr. [name] it appears she would be as very questionable candidate for any aggressive treatment-at this point will monitor and will update  a CBC with routine BMP   [place]anville Lewis, PA-C [phone]

## 2018-10-09 ENCOUNTER — Encounter: Payer: Self-pay | Admitting: Internal Medicine

## 2018-10-09 ENCOUNTER — Non-Acute Institutional Stay (SKILLED_NURSING_FACILITY): Payer: Medicare Other | Admitting: Internal Medicine

## 2018-10-09 DIAGNOSIS — E870 Hyperosmolality and hypernatremia: Secondary | ICD-10-CM | POA: Diagnosis not present

## 2018-10-09 DIAGNOSIS — D72825 Bandemia: Secondary | ICD-10-CM

## 2018-10-09 DIAGNOSIS — I10 Essential (primary) hypertension: Secondary | ICD-10-CM | POA: Diagnosis not present

## 2018-10-09 DIAGNOSIS — D649 Anemia, unspecified: Secondary | ICD-10-CM | POA: Diagnosis not present

## 2018-10-09 LAB — BASIC METABOLIC PANEL
BUN: 16 (ref 4–21)
Creatinine: 0.5 (ref 0.5–1.1)
Glucose: 95
Potassium: 3.9 (ref 3.4–5.3)
Sodium: 141 (ref 137–147)

## 2018-10-09 LAB — CBC AND DIFFERENTIAL
HCT: 34 — AB (ref 36–46)
Hemoglobin: 10.8 — AB (ref 12.0–16.0)
Neutrophils Absolute: 10
Platelets: 181 (ref 150–399)
WBC: 13.9

## 2018-10-09 NOTE — Progress Notes (Signed)
Location:  [facility] Room Number: 203-W Place of Service:  SNF (31)  Autumn Duos, MD  Patient Care Team: Autumn Duos, [name]  Extended Emergency Contact Information Primary Emergency Contact: [name] Address: [address]          [place], [place] [address] [name] Phone: [phone] Relation: Son Secondary Emergency Contact: [place], [place] of [place] Phone: [phone] Relation: Sister    Allergies: Demerol [meperidine]  Chief Complaint  Patient presents with  . Acute Visit    Patient seen for a white count of  13.9.    HPI: Patient is an 83 y.o. female who is being seen because on a routine lab patient's white count is 13.9.  Per nursing patient has not been coughing or had any complaints with pain or urination.  There is been no fever chills nausea vomiting diarrhea cough or any other symptom.  On exam patient looks like her usual self, she has been failing to thrive in the recent weeks.  Past Medical History:  Diagnosis Date  . Anasarca 05/21/2016  . Anxiety 11/03/2016  . Closed TBI (traumatic brain injury) ([facility]) 04/21/2016  . Dysphagia 07/13/2016  . Gout   . Hypertension   . Obesity (BMI 30.0-34.9) 05/21/2016  . Status post insertion of percutaneous endoscopic gastrostomy (PEG) tube ([facility]) 04/21/2016    Past Surgical History:  Procedure Laterality Date  . CHOLECYSTECTOMY N/A 07/21/2017   Procedure: LAPAROSCOPIC CHOLECYSTECTOMY;  Surgeon: Erroll Luna, MD;  Location: [facility];  Service: General;  Laterality: N/A;  . ERCP N/A 02/18/2018   Procedure: ENDOSCOPIC RETROGRADE CHOLANGIOPANCREATOGRAPHY (ERCP);  Surgeon: Milus Banister, MD;  Location: [facility];  Service: Endoscopy;  Laterality: N/A;  . INTRAMEDULLARY (IM) NAIL INTERTROCHANTERIC Right 12/27/2016   Procedure: INTRAMEDULLARY (IM) NAIL INTERTROCHANTRIC RIGHT HIP;   Surgeon: Paralee Cancel, MD;  [facility]: [facility];  Service: Orthopedics;  Laterality: Right;  . MASTECTOMY    . REMOVAL OF STONES  02/18/2018   Procedure: REMOVAL OF STONES;  Surgeon: Milus Banister, MD;  [facility];  Service: Endoscopy;;  balloon sweep  . SPHINCTEROTOMY  02/18/2018   Procedure: SPHINCTEROTOMY;  Surgeon: Milus Banister, MD;  Location: [facility];  Service: Endoscopy;;    Allergies as of 10/09/2018      Reactions   Demerol [meperidine] Swelling      Medication List       Accurate as of October 09, 2018  4:48 PM. If you have any questions, ask your nurse or doctor.        acetaminophen 325 MG tablet Commonly known as: TYLENOL Take 650 mg by mouth every 8 (eight) hours.   bimatoprost 0.01 % Soln Commonly known as: LUMIGAN Place 1 drop into both eyes at bedtime. For glaucoma   bisacodyl 10 MG suppository Commonly known as: DULCOLAX Place 10 mg rectally daily as needed (constipation not relieved by MOM).   Ensure Take 237 mLs by mouth 3 (three) times daily. Prefers chocolate if available   nutrition supplement (JUVEN) Pack Take 1 packet by mouth 2 (two) times daily between meals. mix with 4-8oz water to aid in wound healing.   NUTRITIONAL SUPPLEMENT PO Take 1 each by mouth daily. Magic Cup with lunch   escitalopram 5 MG tablet Commonly known as: LEXAPRO Take 5 mg by mouth daily.   feeding supplement (PRO-STAT  SUGAR FREE 64) Liqd Take 30 mLs by mouth 2 (two) times daily.   FLEET ENEMA RE Place 1 each rectally daily as needed (constipation not relieved by bisacodyl suppository).   hydrochlorothiazide 25 MG tablet Commonly known as: HYDRODIURIL Take 12.5 mg by mouth daily. Take 0.5 tablet to = 12.5 mg   HYDROcodone-acetaminophen 5-325 MG tablet Commonly known as: Norco Take 1 tablet by mouth every 12 (twelve) hours.   levETIRAcetam 500 MG tablet Commonly known as: KEPPRA Take 500 mg by mouth 2 (two) times daily.   MILK OF MAGNESIA  PO Take 30 mLs by mouth daily as needed (if no BM in 3 days).   MULTIPLE VITAMINS-MINERALS PO Take 1 tablet by mouth daily.   potassium chloride SA 20 MEQ tablet Commonly known as: K-DUR Take 20 mEq by mouth daily.   sennosides-docusate sodium 8.6-50 MG tablet Commonly known as: SENOKOT-S Take 2 tablets by mouth 2 (two) times daily as needed for constipation.   Vitamin D3 1.25 MG (50000 UT) Caps Take 1 capsule by mouth once a week.       No orders of the defined types were placed in this encounter.   Immunization History  Administered Date(s) Administered  . Influenza-Unspecified 12/19/2016, 11/26/2017  . PPD Test 04/29/2016  . Pneumococcal Conjugate-13 08/06/2016  . Pneumococcal Polysaccharide-23 04/15/2016  . Tdap 03/31/2016    Social History   Tobacco Use  . Smoking status: Never Smoker  . Smokeless tobacco: Never Used  Substance Use Topics  . Alcohol use: Not Currently    Review of Systems  DATA OBTAINED: from patient-very limited; nursing GENERAL:  no fevers, fatigue, appetite changes SKIN: No itching, rash HEENT: No complaint RESPIRATORY: No cough, wheezing, SOB CARDIAC: No chest pain, palpitations, lower extremity edema  GI: No abdominal pain, No N/V/D or constipation, No heartburn or reflux  GU: No dysuria, frequency or urgency, or incontinence  MUSCULOSKELETAL: No unrelieved bone/joint pain NEUROLOGIC: No headache, dizziness  PSYCHIATRIC: No overt anxiety or sadness  Vitals:   10/09/18 1628  BP: 130/79  Pulse: 82  Resp: 19  Temp: (!) 97 F (36.1 C)   Body mass index is 19.99 kg/m. Physical Exam  GENERAL APPEARANCE: Alert, non-conversant, No acute distress  SKIN: No diaphoresis rash HEENT: Unremarkable RESPIRATORY: Breathing is even, unlabored. Lung sounds are clear   CARDIOVASCULAR: Heart RRR no murmurs, rubs or gallops. No peripheral edema  GASTROINTESTINAL: Abdomen is soft, non-tender, not distended w/ normal bowel sounds.   GENITOURINARY: Bladder non tender, not distended; Foley catheter urine is clear and moderately concentrated MUSCULOSKELETAL: No abnormal joints or musculature NEUROLOGIC: Cranial nerves 2-12 grossly intact. Moves all extremities PSYCHIATRIC: Status post TBI, dementia, no behavioral issues  Patient Active Problem List   Diagnosis Date Noted  . Grand mal seizure ([facility]) 08/16/2018  . Elevated WBC count 08/08/2018  . Abnormal chest x-ray 08/08/2018  . Metastatic cancer to lung of unknown cell type ([facility]) 08/08/2018  . Hypernatremia 05/26/2018  . HCAP (healthcare-associated pneumonia) 05/18/2018  . Acute renal failure (ARF) ([facility]) 05/07/2018  . MRSA (methicillin resistant Staphylococcus aureus) septicemia ([facility]) 02/23/2018  . Hypokalemia 02/23/2018  . Bile duct stone   . E. coli sepsis ([facility]) 02/17/2018  . Cholecystitis 07/31/2017  . Cholecystitis, acute with cholelithiasis 07/20/2017  . Pressure injury of skin 07/20/2017  . History of total right hip arthroplasty 01/05/2017  . Acute blood loss as cause of postoperative anemia 01/05/2017  . Escherichia coli urinary tract infection 01/05/2017  . Acute encephalopathy 01/05/2017  .  Depression, recurrent ([facility]) 01/05/2017  . Polyneuropathy 01/05/2017  . Closed right hip fracture ([facility]) 12/25/2016  . Anxiety 11/03/2016  . Dysphagia 07/13/2016  . Anasarca 05/21/2016  . Obesity (BMI 30.0-34.9) 05/21/2016  . Closed TBI (traumatic brain injury) ([facility]) 04/21/2016  . Status post insertion of percutaneous endoscopic gastrostomy (PEG) tube ([facility]) 04/21/2016  . Facial laceration 04/21/2016  . Cheek wound, right, subsequent encounter 04/21/2016  . Urinary tract infection due to extended-spectrum beta lactamase (ESBL) producing Escherichia coli 04/21/2016  . Hypotension 04/21/2016  . Gout   . Hypertension     CMP     Component Value Date/Time   NA 141 10/09/2018   NA 145 07/28/2017   K 3.9 10/09/2018   K 3.7 07/28/2017   CL 102 08/04/2018 1358    CO2 30 08/04/2018 1358   GLUCOSE 97 08/04/2018 1358   BUN 16 10/09/2018   CREATININE 0.5 10/09/2018   CREATININE 0.69 08/04/2018 1358   CREATININE 0.39 07/28/2017   CALCIUM 8.9 08/04/2018 1358   CALCIUM 8.1 07/28/2017   PROT 7.2 08/04/2018 1358   ALBUMIN 2.9 (L) 08/04/2018 1358   AST 14 09/18/2018   ALT 5 (A) 09/18/2018   ALKPHOS 90 09/18/2018   BILITOT 0.7 08/04/2018 1358   GFRNONAA >60 08/04/2018 1358   GFRNONAA >90 07/28/2017   GFRAA >60 08/04/2018 1358   Recent Labs    02/16/18 2227  02/20/18 0325 02/21/18 0325  08/04/18 1358  09/25/18 10/02/18 10/09/18  NA  --    < > 147* 146*   < > 141   < > 138 142 141  K  --    < > 3.7 3.0*   < > 3.4*   < > 3.7 3.9 3.9  CL  --    < > 116* 112*  --  102  --   --   --   --   CO2  --    < > 24 25  --  30  --   --   --   --   GLUCOSE  --    < > 84 87  --  97  --   --   --   --   BUN  --    < > 10 8   < > 20   < > 15 15 16   CREATININE  --    < > 0.54 0.42*   < > 0.69   < > 0.5 0.5 0.5  CALCIUM  --    < > 7.9* 7.6*  --  8.9  --   --   --   --   MG 2.1  --   --   --   --   --   --   --   --   --    < > = values in this interval not displayed.   Recent Labs    02/20/18 0325 02/21/18 0325 08/04/18 1358 09/18/18  AST 45* 30 17 14   ALT 44 33 12 5*  ALKPHOS 358* 324* 82 90  BILITOT 1.9* 1.1 0.7  --   PROT 5.0* 4.8* 7.2  --   ALBUMIN 2.1* 1.9* 2.9*  --    Recent Labs    02/20/18 0325 02/21/18 0325  08/04/18 1358 08/10/18 09/18/18 10/09/18  WBC 10.8* 7.6   < > 11.2* 11.1 8.8 13.9  NEUTROABS  --   --   --  6.5 7 6 10   HGB 10.4* 10.1*   < >  11.8* 12.6 12.9 10.8*  HCT 35.6* 34.1*   < > 40.3 39 38 34*  MCV 90.4 89.0  --  88.6  --   --   --   PLT 232 192   < > 293 244 268 181   < > = values in this interval not displayed.   No results for input(s): CHOL, LDLCALC, TRIG in the last 8760 hours.  Invalid input(s): HCL No results found for: MICROALBUR Lab Results  Component Value Date   TSH 1.68 08/06/2013   No results found for:  HGBA1C No results found for: CHOL, HDL, LDLCALC, LDLDIRECT, TRIG, CHOLHDL  Significant Diagnostic Results in last 30 days:  No results found.  Assessment and Plan  Elevated WBC- with no obvious signs of illness, wounds are reported to look good, urine looks clear, no coughing or shortness of breath; have ordered a chest x-ray and urine for UA with C and S; will continue to monitor for any signs of illness    Autumn Duos, MD

## 2018-10-10 DIAGNOSIS — N390 Urinary tract infection, site not specified: Secondary | ICD-10-CM | POA: Diagnosis not present

## 2018-10-10 DIAGNOSIS — R319 Hematuria, unspecified: Secondary | ICD-10-CM | POA: Diagnosis not present

## 2018-10-10 DIAGNOSIS — I10 Essential (primary) hypertension: Secondary | ICD-10-CM | POA: Diagnosis not present

## 2018-10-10 DIAGNOSIS — D72828 Other elevated white blood cell count: Secondary | ICD-10-CM | POA: Diagnosis not present

## 2018-10-11 ENCOUNTER — Telehealth: Payer: Self-pay | Admitting: Internal Medicine

## 2018-10-11 MED ORDER — LEVOFLOXACIN 500 MG PO TABS: 500.0000 mg | ORAL_TABLET | Freq: Every day | ORAL | 0 refills | Status: DC

## 2018-10-11 NOTE — Telephone Encounter (Signed)
Received call from [facility] while on call.  Pt with h/o malignancy, lung metastasis on comfort care had wbc 13.9 on Friday.  Dr. [name] had ordered CXR and they were calling with results:  Bilateral pulmonary metastasis, disseminated fungal or bacterial pneumonia not excluded, clinical correlation required--CT recommended to further determine etiology.   Vitals wnl.  Son is Pharmacist, hospital.  Oscillates between wanting investigations and tx and not.  Pain meds were increased due to increased discomfort last week.  Discussed with nurse and we opted to treat as bacterial pneumonia with leukocytosis and see if she responds.  CT would be challenging for pt to endure at this point.  Allergies:  Demerol  Cr 0.54 and BUN 16.1 but pt 83 and frail with poor intake.  Opted for levaquin 500mg  po daily for one week.  Recommend PA or MD f/u on Tuesday and MOST form completion if able.    Mccabe Gloria L. Cleotis Sparr, D.O. [facility] [address], [place] [address] Cell Phone (Mon-Fri 8am-5pm):  [phone] On Call:  [phone] & follow prompts after 5pm & weekends Office Phone:  [phone] Office Fax:  [phone]

## 2018-10-12 ENCOUNTER — Encounter: Payer: Self-pay | Admitting: Internal Medicine

## 2018-10-12 DIAGNOSIS — L89153 Pressure ulcer of sacral region, stage 3: Secondary | ICD-10-CM | POA: Diagnosis not present

## 2018-10-15 DIAGNOSIS — I10 Essential (primary) hypertension: Secondary | ICD-10-CM | POA: Diagnosis not present

## 2018-10-16 DIAGNOSIS — I10 Essential (primary) hypertension: Secondary | ICD-10-CM | POA: Diagnosis not present

## 2018-10-16 DIAGNOSIS — D649 Anemia, unspecified: Secondary | ICD-10-CM | POA: Diagnosis not present

## 2018-10-16 DIAGNOSIS — E870 Hyperosmolality and hypernatremia: Secondary | ICD-10-CM | POA: Diagnosis not present

## 2018-10-19 ENCOUNTER — Non-Acute Institutional Stay (SKILLED_NURSING_FACILITY): Payer: Medicare Other | Admitting: Internal Medicine

## 2018-10-19 ENCOUNTER — Encounter: Payer: Self-pay | Admitting: Internal Medicine

## 2018-10-19 DIAGNOSIS — R627 Adult failure to thrive: Secondary | ICD-10-CM | POA: Diagnosis not present

## 2018-10-19 DIAGNOSIS — G40409 Other generalized epilepsy and epileptic syndromes, not intractable, without status epilepticus: Secondary | ICD-10-CM

## 2018-10-19 DIAGNOSIS — I10 Essential (primary) hypertension: Secondary | ICD-10-CM | POA: Diagnosis not present

## 2018-10-19 DIAGNOSIS — L89153 Pressure ulcer of sacral region, stage 3: Secondary | ICD-10-CM | POA: Diagnosis not present

## 2018-10-19 DIAGNOSIS — S069X6D Unspecified intracranial injury with loss of consciousness greater than 24 hours without return to pre-existing conscious level with patient surviving, subsequent encounter: Secondary | ICD-10-CM | POA: Diagnosis not present

## 2018-10-19 DIAGNOSIS — E870 Hyperosmolality and hypernatremia: Secondary | ICD-10-CM

## 2018-10-19 DIAGNOSIS — C7800 Secondary malignant neoplasm of unspecified lung: Secondary | ICD-10-CM | POA: Diagnosis not present

## 2018-10-19 NOTE — Progress Notes (Signed)
Location:  [facility] Room Number: 203-W Place of Service:  SNF [phone]) Provider:  Granville Lewis, PA-C  [name], [name]  Extended Emergency Contact Information Primary Emergency Contact: [name] Address: [address]          [place], [place] [address] [name] of [place] Phone: [phone] Relation: Son Secondary Emergency Contact: [place], [place] of [place] Phone: [phone] Relation: Sister  Code Status:  Full Code Goals of care: Advanced Directive information Advanced Directives 08/04/2018  Does Patient Have a Medical Advance Directive? No  Would patient like information on creating a medical advance directive? No - Patient declined     Chief Complaint  Patient presents with  . Medical Management of Chronic Issues    Routine [facility] visit  Medical management of chronic medical conditions including metastatic carcinoma to lung history of hypernatremia-seizure disorder-traumatic brain injury- depression-history of anemia-history of vaginal bleeding-hypertension- failure to thrive  HPI:  Pt is an 83 y.o. female seen today for medical management of chronic diseases.  As noted above.  Most recent acute issue was an elevated white count chest x-ray was ordered as well as a urine chest x-ray did show suspicions of pneumonia it did show pulmonary nodules as well with known history of metastatic carcinoma.  She has completed a course of Levaquin per nursing staff-per nursing staff she appears to be relatively at her baseline.  This is complicated with failure to thrive patient has gradually become somewhat less talkative and responsive she does not appear to be in any distress and vital signs are stable.  It appears she has lost some weight she is on numerous supplements including Ensure-Magic cup- and Protostat-.  Today  she is somewhat adverse to getting a physical examination saying "leave me alone"- but she does not appear to be in any distress but is less talkative and somewhat more agitated than I saw her recently.--Speaking with nursing apparently she has days like this where she basically just wants to be left alone  She does receive serial BMPs with a history of hyponatremia her last sodium was 259 update metabolic panel was pending.  There is been no recent reports of seizures she is on Keppra.  She does have a history of vaginal bleeding but considering her significant comorbidities and the coronavirus quarantine it has been difficult to do follow-up.     Past Medical History:  Diagnosis Date  . Anasarca 05/21/2016  . Anxiety 11/03/2016  . Closed TBI (traumatic brain injury) ([facility]) 04/21/2016  . Dysphagia 07/13/2016  . Gout   . Hypertension   . Obesity (BMI 30.0-34.9) 05/21/2016  . Status post insertion of percutaneous endoscopic gastrostomy (PEG) tube ([facility]) 04/21/2016   Past Surgical History:  Procedure Laterality Date  . CHOLECYSTECTOMY N/A 07/21/2017   Procedure: LAPAROSCOPIC CHOLECYSTECTOMY;  Surgeon: Erroll Luna, MD;  Location: [facility];  Service: General;  Laterality: N/A;  . ERCP N/A 02/18/2018   Procedure: ENDOSCOPIC RETROGRADE CHOLANGIOPANCREATOGRAPHY (ERCP);  Surgeon: Milus Banister, MD;  Location: [facility];  Service: Endoscopy;  Laterality: N/A;  . INTRAMEDULLARY (IM) NAIL INTERTROCHANTERIC Right 12/27/2016   Procedure: INTRAMEDULLARY (IM) NAIL INTERTROCHANTRIC RIGHT HIP;  Surgeon: Paralee Cancel, MD;  [facility];  Service: Orthopedics;  Laterality: Right;  . MASTECTOMY    . REMOVAL OF STONES  02/18/2018   Procedure: REMOVAL  OF STONES;  Surgeon: Milus Banister, MD;  [facility];  Service: Endoscopy;;  balloon sweep  . SPHINCTEROTOMY  02/18/2018   Procedure: SPHINCTEROTOMY;  Surgeon: Milus Banister, MD;  Location: [facility];  Service: Endoscopy;;    Allergies   Allergen Reactions  . Demerol [Meperidine] Swelling    Outpatient Encounter Medications as of 10/19/2018  Medication Sig  . acetaminophen (TYLENOL) 325 MG tablet Take 650 mg by mouth every 8 (eight) hours. Routine  . Amino Acids-Protein Hydrolys (FEEDING SUPPLEMENT, PRO-STAT SUGAR FREE 64,) LIQD Take 30 mLs by mouth 2 (two) times daily.   . bimatoprost (LUMIGAN) 0.01 % SOLN Place 1 drop into both eyes at bedtime. For glaucoma  . bisacodyl (DULCOLAX) 10 MG suppository Place 10 mg rectally daily as needed (constipation not relieved by MOM).  . Cholecalciferol (VITAMIN D3) 1.25 MG (50000 UT) CAPS Take 1 capsule by mouth once a week.  . ENSURE (ENSURE) Take 237 mLs by mouth 3 (three) times daily. Prefers chocolate if available  . escitalopram (LEXAPRO) 5 MG tablet Take 5 mg by mouth daily.  . hydrochlorothiazide (HYDRODIURIL) 25 MG tablet Take 12.5 mg by mouth daily. Take 0.5 tablet to = 12.5 mg  . HYDROcodone-acetaminophen (NORCO) 5-325 MG tablet Take 1 tablet by mouth every 12 (twelve) hours.  . levETIRAcetam (KEPPRA) 500 MG tablet Take 500 mg by mouth 2 (two) times daily.   . Magnesium Hydroxide (MILK OF MAGNESIA PO) Take 30 mLs by mouth daily as needed (if no BM in 3 days).  . MULTIPLE VITAMINS-MINERALS PO Take 1 tablet by mouth daily.   . nutrition supplement, JUVEN, (JUVEN) PACK Take 1 packet by mouth 2 (two) times daily between meals. mix with 4-8oz water to aid in wound healing.  . Nutritional Supplements (NUTRITIONAL SUPPLEMENT PO) Take 1 each by mouth daily. Magic Cup with lunch  . potassium chloride SA (K-DUR,KLOR-CON) 20 MEQ tablet Take 20 mEq by mouth daily.   . sennosides-docusate sodium (SENOKOT-S) 8.6-50 MG tablet Take 2 tablets by mouth 2 (two) times daily as needed for constipation.  . Sodium Phosphates (FLEET ENEMA RE) Place 1 each rectally daily as needed (constipation not relieved by bisacodyl suppository).  . [DISCONTINUED] levofloxacin (LEVAQUIN) 500 MG tablet Take 1 tablet  (500 mg total) by mouth daily.   No facility-administered encounter medications on file as of 10/19/2018.     Review of Systems   This is limited secondary to patient not speaking much and wanting to be left alone.  Does not report any acute issues apparently her appetite is quite poor talking with the nursing tech--- we also recently switched her pain medication to routine Tylenol 3 times a day and Norco twice a day secondary to pain issues  Immunization History  Administered Date(s) Administered  . Influenza-Unspecified 12/19/2016, 11/26/2017  . PPD Test 04/29/2016  . Pneumococcal Conjugate-13 08/06/2016  . Pneumococcal Polysaccharide-23 04/15/2016  . Tdap 03/31/2016   Pertinent  Health Maintenance Due  Topic Date Due  . INFLUENZA VACCINE  09/05/2018  . DEXA SCAN  07/20/2019 (Originally 10/02/2002)  . PNA vac Low Risk Adult  Completed   Fall Risk  08/06/2017 08/02/2016  Falls in the past year? Yes Yes  Number falls in past yr: - 1  Injury with Fall? Yes Yes  Comment hip fracture R foot   Functional Status Survey:    Vitals:   10/19/18 1012  BP: 126/80  Pulse: 88  Resp: 19  Temp: 97.9 F (36.6 C)  TempSrc:  Oral  Weight: 113 lb 12.8 oz (51.6 kg)  Height: 5\' 5"  (1.651 m)   Body mass index is 18.94 kg/m. Physical Exam  General this is a frail appearing elderly female she is not in any distress she is alert but wants to be left alone and somewhat agitated with any attempt to examine.  Her skin is warm and dry.  Eyes visual acuity appears grossly intact from what I could assess sclera and conjunctive are clear.  Oropharynx was difficult to assess since patient did not really want to be examined to a great extent.  Chest was not assessed secondary to patient agitation not wanting to be assessed-there is no labored breathing or sign of distress.  Heart again could not really listen to her heart-radial pulse was regular with a few irregular beats at times she has mild  lower extremity edema.  Abdomen could not be assessed secondary to patient request and agitation.  Musculoskeletal has significant debility is able to move her upper extremities it appears at baseline she has general lower extremity weakness and stiffness.  She does have a Foley catheter.  Neurologic she is alert could not really appreciate true lateralizing findings her speech is clear but she did make it clear she just wants to be left alone.  Psych again she is somewhat agitated with a baseline history of traumatic brain injury with dementia.     Labs reviewed:  October 16, 2018.  Sodium 142 potassium 3.9 BUN 17.1 creatinine 0.47   Recent Labs    02/16/18 2227  02/20/18 0325 02/21/18 0325  08/04/18 1358  09/25/18 10/02/18 10/09/18  NA  --    < > 147* 146*   < > 141   < > 138 142 141  K  --    < > 3.7 3.0*   < > 3.4*   < > 3.7 3.9 3.9  CL  --    < > 116* 112*  --  102  --   --   --   --   CO2  --    < > 24 25  --  30  --   --   --   --   GLUCOSE  --    < > 84 87  --  97  --   --   --   --   BUN  --    < > 10 8   < > 20   < > 15 15 16   CREATININE  --    < > 0.54 0.42*   < > 0.69   < > 0.5 0.5 0.5  CALCIUM  --    < > 7.9* 7.6*  --  8.9  --   --   --   --   MG 2.1  --   --   --   --   --   --   --   --   --    < > = values in this interval not displayed.   Recent Labs    02/20/18 0325 02/21/18 0325 08/04/18 1358 09/18/18  AST 45* 30 17 14   ALT 44 33 12 5*  ALKPHOS 358* 324* 82 90  BILITOT 1.9* 1.1 0.7  --   PROT 5.0* 4.8* 7.2  --   ALBUMIN 2.1* 1.9* 2.9*  --    Recent Labs    02/20/18 0325 02/21/18 0325  08/04/18 1358 08/10/18 09/18/18 10/09/18  WBC 10.8* 7.6   < >  11.2* 11.1 8.8 13.9  NEUTROABS  --   --   --  6.5 7 6 10   HGB 10.4* 10.1*   < > 11.8* 12.6 12.9 10.8*  HCT 35.6* 34.1*   < > 40.3 39 38 34*  MCV 90.4 89.0  --  88.6  --   --   --   PLT 232 192   < > 293 244 268 181   < > = values in this interval not displayed.   Lab Results  Component  Value Date   TSH 1.68 08/06/2013   No results found for: HGBA1C No results found for: CHOL, HDL, LDLCALC, LDLDIRECT, TRIG, CHOLHDL  Significant Diagnostic Results in last 30 days:  No results found.  Assessment/Plan  #1 history of metastatic carcinoma to the lung- with increasing evidence of failure to thrive she has lost some weight she is on numerous supplements- her deterioration slowly continues it appears- her son has been made aware-at this point continue supportive care and try to encourage  p.o. intake as much as possible.  Regards to recent suspected bacterial pneumonia she has completed antibiotic apparently she appears to be stabilized in this regard-occasionally has a residual cough per her nursing tech but no sign of distress.  Unfortunately it was difficult to do an exam today because patient was agitated but I could not overtly hear any wheezing or congestion or any sign of respiratory compromise.  At this point continue to monitor.  3.  History of hypernatremia this is being monitored with serial BMPs last sodium was 142 on lab done last week will await updated lab she has been restarted on her hydrochlorothiazide low-dose.  4.  Hypertension this appears to be stable recent blood pressures 126/80 117/79-122/72-she is on low-dose hydrochlorothiazide.  5.  History of vaginal bleeding again this with her comorbidities and the coronavirus quarantine- at this point will monitor there has been no overt signs of vaginal bleeding recently to my knowledge.  6.-  History of anemia hemoglobin on most recent lab was 10.8 it appears earlier this year that was her baseline did rise somewhat to the 12-13 range now back in the tens range at this point will monitor-- will update CBC when BMP is obtained this week  #7 history of pain management she is on Tylenol 650 mg every 8 hours routine now as well as Norco 5 325 mg every 12 hours Tylenol dosing was increased because of pain issues this  will have to be watched.  8.  Depression she is on Lexapro low-dose 5 mg a day she is agitated with exam today--at this point will monitor.  9.  History of sacral wound this is followed by wound care and thought to be stable again this has been complicated somewhat with her general debility and failure to thrive.  10.  History of polyneuropathy at one point she been on Neurontin but it appears this has been discontinued I suspect secondary to possible sedation concerns--again her Tylenol has been increased but this will have to be watched.  11.  History of seizure disorder she continues on Keppra 500 mg twice daily there is been no recent seizures to my knowledge.   [id number]

## 2018-10-23 DIAGNOSIS — D649 Anemia, unspecified: Secondary | ICD-10-CM | POA: Diagnosis not present

## 2018-10-23 DIAGNOSIS — I10 Essential (primary) hypertension: Secondary | ICD-10-CM | POA: Diagnosis not present

## 2018-10-26 ENCOUNTER — Other Ambulatory Visit: Payer: Self-pay | Admitting: Internal Medicine

## 2018-10-26 MED ORDER — HYDROCODONE-ACETAMINOPHEN 5-325 MG PO TABS: 1.0000 | ORAL_TABLET | Freq: Two times a day (BID) | ORAL | 0 refills | Status: DC

## 2018-11-03 DIAGNOSIS — J069 Acute upper respiratory infection, unspecified: Secondary | ICD-10-CM | POA: Diagnosis not present

## 2018-11-05 DIAGNOSIS — R1312 Dysphagia, oropharyngeal phase: Secondary | ICD-10-CM | POA: Diagnosis not present

## 2018-11-06 DIAGNOSIS — I10 Essential (primary) hypertension: Secondary | ICD-10-CM | POA: Diagnosis not present

## 2018-11-06 DIAGNOSIS — D649 Anemia, unspecified: Secondary | ICD-10-CM | POA: Diagnosis not present

## 2018-11-06 DIAGNOSIS — E870 Hyperosmolality and hypernatremia: Secondary | ICD-10-CM | POA: Diagnosis not present

## 2018-11-09 DIAGNOSIS — L89153 Pressure ulcer of sacral region, stage 3: Secondary | ICD-10-CM | POA: Diagnosis not present

## 2018-11-10 ENCOUNTER — Other Ambulatory Visit: Payer: Self-pay | Admitting: Internal Medicine

## 2018-11-10 MED ORDER — HYDROCODONE-ACETAMINOPHEN 5-325 MG PO TABS: 1.0000 | ORAL_TABLET | Freq: Two times a day (BID) | ORAL | 0 refills | Status: DC

## 2018-11-11 ENCOUNTER — Encounter: Payer: Self-pay | Admitting: Internal Medicine

## 2018-11-11 ENCOUNTER — Non-Acute Institutional Stay (SKILLED_NURSING_FACILITY): Payer: Medicare Other | Admitting: Internal Medicine

## 2018-11-11 DIAGNOSIS — I10 Essential (primary) hypertension: Secondary | ICD-10-CM

## 2018-11-11 DIAGNOSIS — S069X6D Unspecified intracranial injury with loss of consciousness greater than 24 hours without return to pre-existing conscious level with patient surviving, subsequent encounter: Secondary | ICD-10-CM | POA: Diagnosis not present

## 2018-11-11 DIAGNOSIS — G40409 Other generalized epilepsy and epileptic syndromes, not intractable, without status epilepticus: Secondary | ICD-10-CM

## 2018-11-11 DIAGNOSIS — E870 Hyperosmolality and hypernatremia: Secondary | ICD-10-CM

## 2018-11-11 DIAGNOSIS — C7800 Secondary malignant neoplasm of unspecified lung: Secondary | ICD-10-CM | POA: Diagnosis not present

## 2018-11-11 NOTE — Progress Notes (Signed)
Location:  [facility] Room Number: 203-W Place of Service:  SNF [phone]) Provider:  Granville Lewis, PA-C  [name], MD  [name], [name]  Extended Emergency Contact Information Primary Emergency Contact: [name] Address: [address]          [place], [place] [address] [name] of [place] Phone: [phone] Relation: Son Secondary Emergency Contact: [place], [place] Phone: [phone] Relation: Sister  Code Status:  Full Code Goals of care: Advanced Directive information Advanced Directives 08/04/2018  Does Patient Have a Medical Advance Directive? No  Would patient like information on creating a medical advance directive? No - Patient declined     Chief Complaint  Patient presents with   Medical Management of Chronic Issues    Routine [facility] visit  Medical management of chronic medical conditions including metastatic carcinoma to the lung as well as seizure disorder history of traumatic brain injury history of hypernatremia as well as depression anemia history of vaginal bleeding hypertension and failure to thrive  HPI:  Pt is an 83 y.o. female seen today for medical management of chronic diseases as noted above.  .  Patient does have a history of significant failure to thrive with a history of traumatic brain injury as well as metastatic carcinoma to the lung.  Compared to last month her weight appears to be stable at around 113 pounds that she is on numerous supplements.  Albumin level last month was 2.8 which was comparable to the level back in June.  She is alert today the last time I examined her she actually was quite agitated but that is not the case today-she states nothing is bothering her but she is a poor historian.  She does get serial BMPs because of a history of hypernatremia lab on  October 2 showed stability with a sodium of 144.  She also has a history of seizure disorder and continues on Keppra 500 mg twice daily.   also has some history of vaginal bleeding but with coronavirus restrictions here as well as her significant comorbidities this is been difficult to follow-up on-her hemoglobin has been stable it was 11.3 on lab done last month   Past Medical History:  Diagnosis Date   Anasarca 05/21/2016   Anxiety 11/03/2016   Closed TBI (traumatic brain injury) ([place]) 04/21/2016   Dysphagia 07/13/2016   Gout    Hypertension    Obesity (BMI 30.0-34.9) 05/21/2016   Status post insertion of percutaneous endoscopic gastrostomy (PEG) tube ([facility]) 04/21/2016   Past Surgical History:  Procedure Laterality Date   CHOLECYSTECTOMY N/A 07/21/2017   Procedure: LAPAROSCOPIC CHOLECYSTECTOMY;  Surgeon: Erroll Luna, MD;  Location: [facility];  Service: General;  Laterality: N/A;   ERCP N/A 02/18/2018   Procedure: ENDOSCOPIC RETROGRADE CHOLANGIOPANCREATOGRAPHY (ERCP);  Surgeon: Milus Banister, MD;  Location: [facility];  Service: Endoscopy;  Laterality: N/A;   INTRAMEDULLARY (IM) NAIL INTERTROCHANTERIC Right 12/27/2016   Procedure: INTRAMEDULLARY (IM) NAIL INTERTROCHANTRIC RIGHT HIP;  Surgeon: Paralee Cancel, MD;  Location: [facility];  Service: Orthopedics;  Laterality: Right;   MASTECTOMY     REMOVAL OF STONES  02/18/2018   Procedure: REMOVAL OF STONES;  Surgeon: Milus Banister, MD;  Location: [facility];  Service: Endoscopy;;  balloon sweep   SPHINCTEROTOMY  02/18/2018   Procedure: Joan Mayans;  Surgeon: Ardis Hughs,  Melene Plan, MD;  Location: [facility];  Service: Endoscopy;;    Allergies  Allergen Reactions   Demerol [Meperidine] Swelling    Outpatient Encounter Medications as of 11/11/2018  Medication Sig   acetaminophen (TYLENOL) 325 MG tablet Take 650 mg by mouth every 8 (eight) hours. Routine   Amino Acids-Protein Hydrolys (FEEDING SUPPLEMENT, PRO-STAT SUGAR  FREE 64,) LIQD Take 30 mLs by mouth 2 (two) times daily.    bimatoprost (LUMIGAN) 0.01 % SOLN Place 1 drop into both eyes at bedtime. For glaucoma   bisacodyl (DULCOLAX) 10 MG suppository Place 10 mg rectally daily as needed (constipation not relieved by MOM).   Cholecalciferol (VITAMIN D3) 1.25 MG (50000 UT) CAPS Take 1 capsule by mouth once a week.   ENSURE (ENSURE) Take 237 mLs by mouth 3 (three) times daily. Prefers chocolate if available   escitalopram (LEXAPRO) 5 MG tablet Take 5 mg by mouth daily.   hydrochlorothiazide (HYDRODIURIL) 25 MG tablet Take 12.5 mg by mouth daily. Take 0.5 tablet to = 12.5 mg   HYDROcodone-acetaminophen (NORCO) 5-325 MG tablet Take 1 tablet by mouth every 12 (twelve) hours.   levETIRAcetam (KEPPRA) 500 MG tablet Take 500 mg by mouth 2 (two) times daily.    Magnesium Hydroxide (MILK OF MAGNESIA PO) Take 30 mLs by mouth daily as needed (if no BM in 3 days).   MULTIPLE VITAMINS-MINERALS PO Take 1 tablet by mouth daily.    nutrition supplement, JUVEN, (JUVEN) PACK Take 1 packet by mouth 2 (two) times daily between meals. mix with 4-8oz water to aid in wound healing.   Nutritional Supplements (NUTRITIONAL SUPPLEMENT PO) Take 1 each by mouth daily. Magic Cup with lunch   potassium chloride SA (K-DUR,KLOR-CON) 20 MEQ tablet Take 20 mEq by mouth daily.    sennosides-docusate sodium (SENOKOT-S) 8.6-50 MG tablet Take 2 tablets by mouth 2 (two) times daily as needed for constipation.   Sodium Phosphates (FLEET ENEMA RE) Place 1 each rectally daily as needed (constipation not relieved by bisacodyl suppository).   No facility-administered encounter medications on file as of 11/11/2018.     Review of Systems.  Review of systems is limited secondary to patient being a poor historian she is talking to me today does not state that anything is bothering her she is pleasant and allowing an exam-- although she has some difficulty following verbal commands Nursing  does not report any recent acute issues.    Immunization History  Administered Date(s) Administered   Influenza-Unspecified 12/19/2016, 11/26/2017   PPD Test 04/29/2016   Pneumococcal Conjugate-13 08/06/2016   Pneumococcal Polysaccharide-23 04/15/2016   Tdap 03/31/2016   Pertinent  Health Maintenance Due  Topic Date Due   INFLUENZA VACCINE  09/05/2018   DEXA SCAN  07/20/2019 (Originally 10/02/2002)   PNA vac Low Risk Adult  Completed   Fall Risk  08/06/2017 08/02/2016  Falls in the past year? Yes Yes  Number falls in past yr: - 1  Injury with Fall? Yes Yes  Comment hip fracture R foot   Functional Status Survey:    Vitals:   11/11/18 1145  BP: 132/71  Pulse: 80  Resp: 20  Temp: 98.6 F (37 C)  TempSrc: Oral  Weight: 113 lb 12.8 oz (51.6 kg)  Height: 5\' 5"  (1.651 m)   Body mass index is 18.94 kg/m. Physical Exam  In general this is a frail appearing elderly female in no distress she is alert making eye contact and is speaking some.  Her skin is warm  and dry she does have covering over her feet bilaterally.--She also has a sacral wound which was not assessed secondary to area being covered is followed by wound care nurse and physician assistant  Eyes visual acuity appears grossly intact sclera and conjunctive appear to be clear.  Oropharynx is clear mucous membranes appear to be fairly moist.  Chest is clear to auscultation with somewhat shallow air entry there is no labored breathing.   Heart is regular rate and rhythm without murmur gallop or rub she does not appear to have significant lower extremity edema.  Abdomen is soft nontender with positive bowel sounds.  GU she does have an indwelling Foley catheter draining amber-colored urine.  Musculoskeletal continues with her debility at baseline does move her upper extremities has stiffness of her lower extremities at baseline.  Neurologic she is alert she is pleasant does follow some simple verbal  commands could not really appreciate lateralizing findings.  Psych --she is pleasant today less agitated when I saw her last time- does have cognitive issues but is cooperative with exam   Labs reviewed:  November 06, 2018.  Sodium 144 potassium 4 BUN 26.9 creatinine 0.55.  October 13, 2018.  WBC 14.7 hemoglobin 11.3 platelets 329.   Recent Labs    02/16/18 2227  02/20/18 0325 02/21/18 0325  08/04/18 1358  09/25/18 10/02/18 10/09/18  NA  --    < > 147* 146*   < > 141   < > 138 142 141  K  --    < > 3.7 3.0*   < > 3.4*   < > 3.7 3.9 3.9  CL  --    < > 116* 112*  --  102  --   --   --   --   CO2  --    < > 24 25  --  30  --   --   --   --   GLUCOSE  --    < > 84 87  --  97  --   --   --   --   BUN  --    < > 10 8   < > 20   < > 15 15 16   CREATININE  --    < > 0.54 0.42*   < > 0.69   < > 0.5 0.5 0.5  CALCIUM  --    < > 7.9* 7.6*  --  8.9  --   --   --   --   MG 2.1  --   --   --   --   --   --   --   --   --    < > = values in this interval not displayed.   Recent Labs    02/20/18 0325 02/21/18 0325 08/04/18 1358 09/18/18  AST 45* 30 17 14   ALT 44 33 12 5*  ALKPHOS 358* 324* 82 90  BILITOT 1.9* 1.1 0.7  --   PROT 5.0* 4.8* 7.2  --   ALBUMIN 2.1* 1.9* 2.9*  --    Recent Labs    02/20/18 0325 02/21/18 0325  08/04/18 1358 08/10/18 09/18/18 10/09/18  WBC 10.8* 7.6   < > 11.2* 11.1 8.8 13.9  NEUTROABS  --   --   --  6.5 7 6 10   HGB 10.4* 10.1*   < > 11.8* 12.6 12.9 10.8*  HCT 35.6* 34.1*   < > 40.3 39 38 34*  MCV 90.4  89.0  --  88.6  --   --   --   PLT 232 192   < > 293 244 268 181   < > = values in this interval not displayed.   Lab Results  Component Value Date   TSH 1.68 08/06/2013   No results found for: HGBA1C No results found for: CHOL, HDL, LDLCALC, LDLDIRECT, TRIG, CHOLHDL  Significant Diagnostic Results in last 30 days:  No results found.  Assessment/Plan  #1 history of metastatic carcinoma to the lung with increased failure to thrive issues-her  weight appears to be somewhat stabilized compared to last month she is on numerous supplements including Magic cup Ensure Juven and Protostat.  Her son has been made aware of the gradual deterioration-at this point continue to encourage p.o. intake strongly continue supportive care.  2.  History of hypernatremia this is been stable now for some time per serial BMPs last sodium was 144 this will be followed with an updated labs next week.  3.-  History of hypertension appears to be stable recent blood pressures 132/71-101/71-112/62- I see 1 listed as 153/84 but this is fairly unusual- she is on low-dose hydrochlorothiazide.  4.  History of vaginal bleeding as noted above with her comorbidities and coronavirus concerns this is been difficult to follow-up on her hemoglobin has shown stability recently 11.3 will have CBC updated when we draw the BMP next week.  5.  Pain management she continues on Tylenol 650 mg 3 times daily as well as Norco 5-325 mg twice daily- this appears to be controlled she does not really complain of pain today.   #6 history of seizure disorder this appears to be stable on Keppra 500 mg twice daily  #7-history of sacral wound this is followed by wound care apparently this is been complicated with failure to thrive issues but they are keeping an eye on this with wound care nurse as well as with the wound care physician assistant who is on her.  8.  History of depression she does continue on Lexapro 5 mg a day-when I saw her last time she was quite agitated but that is not the case today appears to be in better spirits this is complicated however with her cognitive issues.  [id number]

## 2018-11-13 DIAGNOSIS — I10 Essential (primary) hypertension: Secondary | ICD-10-CM | POA: Diagnosis not present

## 2018-11-13 DIAGNOSIS — D649 Anemia, unspecified: Secondary | ICD-10-CM | POA: Diagnosis not present

## 2018-11-13 DIAGNOSIS — D519 Vitamin B12 deficiency anemia, unspecified: Secondary | ICD-10-CM | POA: Diagnosis not present

## 2018-11-13 DIAGNOSIS — E559 Vitamin D deficiency, unspecified: Secondary | ICD-10-CM | POA: Diagnosis not present

## 2018-11-13 DIAGNOSIS — E870 Hyperosmolality and hypernatremia: Secondary | ICD-10-CM | POA: Diagnosis not present

## 2018-11-13 LAB — IRON,TIBC AND FERRITIN PANEL
%SAT: 40.74
Ferritin: 1961
Iron: 55
TIBC: 136
UIBC: 80

## 2018-11-13 LAB — CBC AND DIFFERENTIAL
HCT: 37 (ref 36–46)
HCT: 40 (ref 36–46)
Hemoglobin: 12 (ref 12.0–16.0)
Hemoglobin: 12.9 (ref 12.0–16.0)
Neutrophils Absolute: 12
Neutrophils Absolute: 15
Platelets: 305 (ref 150–399)
Platelets: 372 (ref 150–399)
WBC: 15.2
WBC: 18.7

## 2018-11-13 LAB — VITAMIN D 25 HYDROXY (VIT D DEFICIENCY, FRACTURES): Vit D, 25-Hydroxy: 38.33

## 2018-11-13 LAB — BASIC METABOLIC PANEL
BUN: 60 — AB (ref 4–21)
Creatinine: 1.1 (ref 0.5–1.1)
Glucose: 92
Potassium: 4.2 (ref 3.4–5.3)
Sodium: 143 (ref 137–147)

## 2018-11-13 LAB — VITAMIN B12: Vitamin B-12: 1276

## 2018-11-16 ENCOUNTER — Non-Acute Institutional Stay (SKILLED_NURSING_FACILITY): Payer: Medicare Other | Admitting: Internal Medicine

## 2018-11-16 DIAGNOSIS — E860 Dehydration: Secondary | ICD-10-CM | POA: Diagnosis not present

## 2018-11-16 DIAGNOSIS — R627 Adult failure to thrive: Secondary | ICD-10-CM

## 2018-11-16 DIAGNOSIS — C7800 Secondary malignant neoplasm of unspecified lung: Secondary | ICD-10-CM | POA: Diagnosis not present

## 2018-11-17 ENCOUNTER — Encounter: Payer: Self-pay | Admitting: Internal Medicine

## 2018-11-17 ENCOUNTER — Emergency Department (HOSPITAL_COMMUNITY): Payer: Medicare Other

## 2018-11-17 ENCOUNTER — Inpatient Hospital Stay (HOSPITAL_COMMUNITY)
Admission: EM | Admit: 2018-11-17 | Discharge: 2018-11-22 | DRG: 871 | Disposition: A | Discharge status: Skilled Nursing Facility | Payer: Medicare Other | Attending: Internal Medicine | Admitting: Internal Medicine

## 2018-11-17 ENCOUNTER — Other Ambulatory Visit: Payer: Self-pay

## 2018-11-17 ENCOUNTER — Non-Acute Institutional Stay (SKILLED_NURSING_FACILITY): Payer: Medicare Other | Admitting: Internal Medicine

## 2018-11-17 DIAGNOSIS — D72829 Elevated white blood cell count, unspecified: Secondary | ICD-10-CM

## 2018-11-17 DIAGNOSIS — C7800 Secondary malignant neoplasm of unspecified lung: Secondary | ICD-10-CM | POA: Diagnosis present

## 2018-11-17 DIAGNOSIS — Z8619 Personal history of other infectious and parasitic diseases: Secondary | ICD-10-CM

## 2018-11-17 DIAGNOSIS — C7801 Secondary malignant neoplasm of right lung: Secondary | ICD-10-CM | POA: Diagnosis present

## 2018-11-17 DIAGNOSIS — R34 Anuria and oliguria: Secondary | ICD-10-CM | POA: Diagnosis not present

## 2018-11-17 DIAGNOSIS — C7802 Secondary malignant neoplasm of left lung: Secondary | ICD-10-CM | POA: Diagnosis not present

## 2018-11-17 DIAGNOSIS — K5641 Fecal impaction: Secondary | ICD-10-CM | POA: Diagnosis present

## 2018-11-17 DIAGNOSIS — A419 Sepsis, unspecified organism: Principal | ICD-10-CM | POA: Diagnosis present

## 2018-11-17 DIAGNOSIS — R8271 Bacteriuria: Secondary | ICD-10-CM

## 2018-11-17 DIAGNOSIS — Z1612 Extended spectrum beta lactamase (ESBL) resistance: Secondary | ICD-10-CM | POA: Diagnosis present

## 2018-11-17 DIAGNOSIS — Z6820 Body mass index (BMI) 20.0-20.9, adult: Secondary | ICD-10-CM

## 2018-11-17 DIAGNOSIS — Z1629 Resistance to other single specified antibiotic: Secondary | ICD-10-CM | POA: Diagnosis present

## 2018-11-17 DIAGNOSIS — R000 Tachycardia, unspecified: Secondary | ICD-10-CM | POA: Diagnosis not present

## 2018-11-17 DIAGNOSIS — Z1611 Resistance to penicillins: Secondary | ICD-10-CM | POA: Diagnosis present

## 2018-11-17 DIAGNOSIS — Z9010 Acquired absence of unspecified breast and nipple: Secondary | ICD-10-CM

## 2018-11-17 DIAGNOSIS — Z20828 Contact with and (suspected) exposure to other viral communicable diseases: Secondary | ICD-10-CM | POA: Diagnosis present

## 2018-11-17 DIAGNOSIS — I10 Essential (primary) hypertension: Secondary | ICD-10-CM | POA: Diagnosis present

## 2018-11-17 DIAGNOSIS — R531 Weakness: Secondary | ICD-10-CM | POA: Diagnosis not present

## 2018-11-17 DIAGNOSIS — R1310 Dysphagia, unspecified: Secondary | ICD-10-CM | POA: Diagnosis present

## 2018-11-17 DIAGNOSIS — M84452A Pathological fracture, left femur, initial encounter for fracture: Secondary | ICD-10-CM | POA: Diagnosis present

## 2018-11-17 DIAGNOSIS — S72002A Fracture of unspecified part of neck of left femur, initial encounter for closed fracture: Secondary | ICD-10-CM | POA: Diagnosis not present

## 2018-11-17 DIAGNOSIS — E876 Hypokalemia: Secondary | ICD-10-CM | POA: Diagnosis present

## 2018-11-17 DIAGNOSIS — R569 Unspecified convulsions: Secondary | ICD-10-CM | POA: Diagnosis present

## 2018-11-17 DIAGNOSIS — Z03818 Encounter for observation for suspected exposure to other biological agents ruled out: Secondary | ICD-10-CM | POA: Diagnosis not present

## 2018-11-17 DIAGNOSIS — C3490 Malignant neoplasm of unspecified part of unspecified bronchus or lung: Secondary | ICD-10-CM | POA: Diagnosis not present

## 2018-11-17 DIAGNOSIS — R918 Other nonspecific abnormal finding of lung field: Secondary | ICD-10-CM | POA: Diagnosis not present

## 2018-11-17 DIAGNOSIS — L8990 Pressure ulcer of unspecified site, unspecified stage: Secondary | ICD-10-CM | POA: Diagnosis present

## 2018-11-17 DIAGNOSIS — M109 Gout, unspecified: Secondary | ICD-10-CM | POA: Diagnosis present

## 2018-11-17 DIAGNOSIS — R5381 Other malaise: Secondary | ICD-10-CM | POA: Diagnosis not present

## 2018-11-17 DIAGNOSIS — G92 Toxic encephalopathy: Secondary | ICD-10-CM | POA: Diagnosis not present

## 2018-11-17 DIAGNOSIS — L89153 Pressure ulcer of sacral region, stage 3: Secondary | ICD-10-CM | POA: Diagnosis not present

## 2018-11-17 DIAGNOSIS — B9620 Unspecified Escherichia coli [E. coli] as the cause of diseases classified elsewhere: Secondary | ICD-10-CM | POA: Diagnosis present

## 2018-11-17 DIAGNOSIS — E860 Dehydration: Secondary | ICD-10-CM | POA: Diagnosis not present

## 2018-11-17 DIAGNOSIS — F419 Anxiety disorder, unspecified: Secondary | ICD-10-CM | POA: Diagnosis present

## 2018-11-17 DIAGNOSIS — C801 Malignant (primary) neoplasm, unspecified: Secondary | ICD-10-CM | POA: Diagnosis present

## 2018-11-17 DIAGNOSIS — F0390 Unspecified dementia without behavioral disturbance: Secondary | ICD-10-CM | POA: Diagnosis present

## 2018-11-17 DIAGNOSIS — E870 Hyperosmolality and hypernatremia: Secondary | ICD-10-CM | POA: Diagnosis not present

## 2018-11-17 DIAGNOSIS — M79652 Pain in left thigh: Secondary | ICD-10-CM | POA: Diagnosis not present

## 2018-11-17 DIAGNOSIS — Z79891 Long term (current) use of opiate analgesic: Secondary | ICD-10-CM

## 2018-11-17 DIAGNOSIS — Z79899 Other long term (current) drug therapy: Secondary | ICD-10-CM

## 2018-11-17 DIAGNOSIS — Z87820 Personal history of traumatic brain injury: Secondary | ICD-10-CM

## 2018-11-17 DIAGNOSIS — R404 Transient alteration of awareness: Secondary | ICD-10-CM | POA: Diagnosis not present

## 2018-11-17 DIAGNOSIS — N939 Abnormal uterine and vaginal bleeding, unspecified: Secondary | ICD-10-CM | POA: Diagnosis not present

## 2018-11-17 DIAGNOSIS — B9629 Other Escherichia coli [E. coli] as the cause of diseases classified elsewhere: Secondary | ICD-10-CM | POA: Diagnosis present

## 2018-11-17 DIAGNOSIS — D649 Anemia, unspecified: Secondary | ICD-10-CM | POA: Diagnosis not present

## 2018-11-17 DIAGNOSIS — N179 Acute kidney failure, unspecified: Secondary | ICD-10-CM | POA: Diagnosis present

## 2018-11-17 DIAGNOSIS — M1712 Unilateral primary osteoarthritis, left knee: Secondary | ICD-10-CM | POA: Diagnosis not present

## 2018-11-17 DIAGNOSIS — R799 Abnormal finding of blood chemistry, unspecified: Secondary | ICD-10-CM | POA: Diagnosis not present

## 2018-11-17 DIAGNOSIS — Z888 Allergy status to other drugs, medicaments and biological substances status: Secondary | ICD-10-CM

## 2018-11-17 DIAGNOSIS — M25552 Pain in left hip: Secondary | ICD-10-CM | POA: Diagnosis not present

## 2018-11-17 DIAGNOSIS — R627 Adult failure to thrive: Secondary | ICD-10-CM | POA: Diagnosis present

## 2018-11-17 DIAGNOSIS — Z87442 Personal history of urinary calculi: Secondary | ICD-10-CM

## 2018-11-17 DIAGNOSIS — E872 Acidosis: Secondary | ICD-10-CM | POA: Diagnosis not present

## 2018-11-17 DIAGNOSIS — Z7401 Bed confinement status: Secondary | ICD-10-CM

## 2018-11-17 DIAGNOSIS — N390 Urinary tract infection, site not specified: Secondary | ICD-10-CM | POA: Diagnosis present

## 2018-11-17 DIAGNOSIS — R52 Pain, unspecified: Secondary | ICD-10-CM

## 2018-11-17 DIAGNOSIS — Z515 Encounter for palliative care: Secondary | ICD-10-CM | POA: Diagnosis present

## 2018-11-17 LAB — CBC WITH DIFFERENTIAL/PLATELET
Abs Immature Granulocytes: 0.24 10*3/uL — ABNORMAL HIGH (ref 0.00–0.07)
Basophils Absolute: 0.1 10*3/uL (ref 0.0–0.1)
Basophils Relative: 0 %
Eosinophils Absolute: 0.1 10*3/uL (ref 0.0–0.5)
Eosinophils Relative: 0 %
HCT: 42.5 % (ref 36.0–46.0)
Hemoglobin: 13.3 g/dL (ref 12.0–15.0)
Immature Granulocytes: 1 %
Lymphocytes Relative: 15 %
Lymphs Abs: 2.7 10*3/uL (ref 0.7–4.0)
MCH: 29.4 pg (ref 26.0–34.0)
MCHC: 31.3 g/dL (ref 30.0–36.0)
MCV: 93.8 fL (ref 80.0–100.0)
Monocytes Absolute: 0.9 10*3/uL (ref 0.1–1.0)
Monocytes Relative: 5 %
Neutro Abs: 14.1 10*3/uL — ABNORMAL HIGH (ref 1.7–7.7)
Neutrophils Relative %: 79 %
Platelets: 369 10*3/uL (ref 150–400)
RBC: 4.53 MIL/uL (ref 3.87–5.11)
RDW: 19.6 % — ABNORMAL HIGH (ref 11.5–15.5)
WBC: 18 10*3/uL — ABNORMAL HIGH (ref 4.0–10.5)
nRBC: 0.2 % (ref 0.0–0.2)

## 2018-11-17 LAB — LIPASE, BLOOD: Lipase: 17 U/L (ref 11–51)

## 2018-11-17 LAB — URINALYSIS, ROUTINE W REFLEX MICROSCOPIC
Bilirubin Urine: NEGATIVE
Glucose, UA: NEGATIVE mg/dL
Ketones, ur: NEGATIVE mg/dL
Nitrite: NEGATIVE
Protein, ur: NEGATIVE mg/dL
RBC / HPF: >50 RBC/hpf — ABNORMAL HIGH (ref 0–5)
Specific Gravity, Urine: 1.016 (ref 1.005–1.030)
WBC, UA: >50 WBC/hpf — ABNORMAL HIGH (ref 0–5)
pH: 5 (ref 5.0–8.0)

## 2018-11-17 LAB — COMPREHENSIVE METABOLIC PANEL
ALT: 11 U/L (ref 0–44)
AST: 25 U/L (ref 15–41)
Albumin: 2.5 g/dL — ABNORMAL LOW (ref 3.5–5.0)
Alkaline Phosphatase: 140 U/L — ABNORMAL HIGH (ref 38–126)
Anion gap: 13 (ref 5–15)
BUN: 70 mg/dL — ABNORMAL HIGH (ref 8–23)
CO2: 23 mmol/L (ref 22–32)
Calcium: 9 mg/dL (ref 8.9–10.3)
Chloride: 107 mmol/L (ref 98–111)
Creatinine, Ser: 1.13 mg/dL — ABNORMAL HIGH (ref 0.44–1.00)
GFR calc Af Amer: 53 mL/min — ABNORMAL LOW (ref >60)
GFR calc non Af Amer: 46 mL/min — ABNORMAL LOW (ref >60)
Glucose, Bld: 118 mg/dL — ABNORMAL HIGH (ref 70–99)
Potassium: 4.3 mmol/L (ref 3.5–5.1)
Sodium: 143 mmol/L (ref 135–145)
Total Bilirubin: 0.8 mg/dL (ref 0.3–1.2)
Total Protein: 6.3 g/dL — ABNORMAL LOW (ref 6.5–8.1)

## 2018-11-17 LAB — LACTIC ACID, PLASMA
Lactic Acid, Venous: 2.9 mmol/L (ref 0.5–1.9)
Lactic Acid, Venous: 2.9 mmol/L (ref 0.5–1.9)

## 2018-11-17 MED ORDER — VANCOMYCIN HCL IN DEXTROSE 1-5 GM/200ML-% IV SOLN
1000.0000 mg | Freq: Once | INTRAVENOUS | Status: AC
  Administered 2018-11-17: 1000 mg via INTRAVENOUS
  Filled 2018-11-17: qty 200

## 2018-11-17 MED ORDER — SODIUM CHLORIDE 0.9 % IV SOLN
2.0000 g | Freq: Once | INTRAVENOUS | Status: AC
  Administered 2018-11-17: 2 g via INTRAVENOUS
  Filled 2018-11-17: qty 2

## 2018-11-17 MED ORDER — METRONIDAZOLE IN NACL 5-0.79 MG/ML-% IV SOLN
500.0000 mg | Freq: Once | INTRAVENOUS | Status: AC
  Administered 2018-11-17: 500 mg via INTRAVENOUS
  Filled 2018-11-17: qty 100

## 2018-11-17 MED ORDER — SODIUM CHLORIDE 0.9 % IV SOLN
Freq: Once | INTRAVENOUS | Status: AC
  Administered 2018-11-17: 20:00:00 via INTRAVENOUS

## 2018-11-17 NOTE — ED Triage Notes (Addendum)
Per EMS, patient comes from [facility], for c/o elevated WBC. EMS unable to report specific numbers. EMS reports patient has not had nutrition or hydration in 3 days.  Patient presents with confusion. Has indwelling catheter (chronic).

## 2018-11-17 NOTE — ED Provider Notes (Signed)
[facility] DEPT Provider Note   CSN: [id number] Arrival date & time: 11/17/18  1823     History   Chief Complaint Chief Complaint  Patient presents with   Abnormal Lab    elevated white count     HPI Autumn Bryant is a 83 y.o. female with a past medical history of seizures, TBI, chronic indwelling Foley, wounds on her bilateral feet and sacrum, who presents today from [facility] for evaluation of leukocytosis.  PA from [facility] saw patient today.  According to note she has had an acute decreased urinary output.  Over the past few days she has reportedly had decreased p.o. intake.  Her catheter was changed however she has not had any urine output.  Reportedly IV fluids were attempted however was not able to be completed.  According to note she gets serial BMPs, labs show her creatinine is up from baseline at 1.06 and her BUN is 59.8 which is double what it has been previously.  Her white count was also reportedly elevated at 15.2.  According to their note she is "relatively at baseline with baseline cognitive issues."     HPI  Past Medical History:  Diagnosis Date   Anasarca 05/21/2016   Anxiety 11/03/2016   Closed TBI (traumatic brain injury) ([place]) 04/21/2016   Dysphagia 07/13/2016   Gout    Hypertension    Obesity (BMI 30.0-34.9) 05/21/2016   Status post insertion of percutaneous endoscopic gastrostomy (PEG) tube ([place]) 04/21/2016    Patient Active Problem List   Diagnosis Date Noted   UTI (urinary tract infection) 11/18/2018   Grand mal seizure ([place]) 08/16/2018   Elevated WBC count 08/08/2018   Abnormal chest x-ray 08/08/2018   Metastatic cancer to lung of unknown cell type ([place]) 08/08/2018   Hypernatremia 05/26/2018   HCAP (healthcare-associated pneumonia) 05/18/2018   Acute renal failure (ARF) ([place]) 05/07/2018   MRSA (methicillin resistant Staphylococcus aureus) septicemia ([place]) 02/23/2018    Hypokalemia 02/23/2018   Bile duct stone    E. coli sepsis ([place]) 02/17/2018   Cholecystitis 07/31/2017   Cholecystitis, acute with cholelithiasis 07/20/2017   Pressure injury of skin 07/20/2017   History of total right hip arthroplasty 01/05/2017   Acute blood loss as cause of postoperative anemia 01/05/2017   Escherichia coli urinary tract infection 01/05/2017   Acute encephalopathy 01/05/2017   Depression, recurrent ([place]) 01/05/2017   Polyneuropathy 01/05/2017   Closed right hip fracture ([place]) 12/25/2016   Anxiety 11/03/2016   Dysphagia 07/13/2016   Anasarca 05/21/2016   Obesity (BMI 30.0-34.9) 05/21/2016   Closed TBI (traumatic brain injury) ([place]) 04/21/2016   Status post insertion of percutaneous endoscopic gastrostomy (PEG) tube ([place]) 04/21/2016   Facial laceration 04/21/2016   Cheek wound, right, subsequent encounter 04/21/2016   Urinary tract infection due to extended-spectrum beta lactamase (ESBL) producing Escherichia coli 04/21/2016   Hypotension 04/21/2016   Gout    Hypertension     Past Surgical History:  Procedure Laterality Date   CHOLECYSTECTOMY N/A 07/21/2017   Procedure: LAPAROSCOPIC CHOLECYSTECTOMY;  Surgeon: Erroll Luna, MD;  Location: [facility];  Service: General;  Laterality: N/A;   ERCP N/A 02/18/2018   Procedure: ENDOSCOPIC RETROGRADE CHOLANGIOPANCREATOGRAPHY (ERCP);  Surgeon: Milus Banister, MD;  Location: [facility];  Service: Endoscopy;  Laterality: N/A;   INTRAMEDULLARY (IM) NAIL INTERTROCHANTERIC Right 12/27/2016   Procedure: INTRAMEDULLARY (IM) NAIL INTERTROCHANTRIC RIGHT HIP;  Surgeon: Paralee Cancel, MD;  Location: [facility];  Service: Orthopedics;  Laterality:  Right;   MASTECTOMY     REMOVAL OF STONES  02/18/2018   Procedure: REMOVAL OF STONES;  Surgeon: Milus Banister, MD;  Location: [facility];  Service: Endoscopy;;  balloon sweep   SPHINCTEROTOMY  02/18/2018   Procedure: Joan Mayans;  Surgeon: Milus Banister,  MD;  Location: [facility];  Service: Endoscopy;;     OB History   No obstetric history on file.      Home Medications    Prior to Admission medications   Medication Sig Start Date End Date Taking? Authorizing Provider  acetaminophen (TYLENOL) 325 MG tablet Take 650 mg by mouth every 8 (eight) hours. Routine   Yes [provider]  Amino Acids-Protein Hydrolys (FEEDING SUPPLEMENT, PRO-STAT SUGAR FREE 64,) LIQD Take 30 mLs by mouth 2 (two) times daily.    Yes [provider]  bimatoprost (LUMIGAN) 0.01 % SOLN Place 1 drop into both eyes at bedtime. For glaucoma   Yes [provider]  bisacodyl (DULCOLAX) 10 MG suppository Place 10 mg rectally daily as needed (constipation not relieved by MOM).   Yes [provider]  ENSURE (ENSURE) Take 237 mLs by mouth 3 (three) times daily. Prefers chocolate if available   Yes [provider]  escitalopram (LEXAPRO) 5 MG tablet Take 5 mg by mouth daily.   Yes [provider]  hydrochlorothiazide (HYDRODIURIL) 25 MG tablet Take 12.5 mg by mouth daily. Take 0.5 tablet to = 12.5 mg   Yes [provider]  HYDROcodone-acetaminophen (NORCO) 5-325 MG tablet Take 1 tablet by mouth every 12 (twelve) hours. 11/10/18  Yes [name], MD  levETIRAcetam (KEPPRA) 500 MG tablet Take 500 mg by mouth 2 (two) times daily.    Yes [provider]  Magnesium Hydroxide (MILK OF MAGNESIA PO) Take 30 mLs by mouth daily as needed (if no BM in 3 days).   Yes [provider]  MULTIPLE VITAMINS-MINERALS PO Take 1 tablet by mouth daily.    Yes [provider]  Nutritional Supplements (NUTRITIONAL SUPPLEMENT PO) Take 1 each by mouth daily. Magic Cup with lunch   Yes [provider]  potassium chloride SA (K-DUR,KLOR-CON) 20 MEQ tablet Take 20 mEq by mouth daily.    Yes [provider]  sennosides-docusate sodium (SENOKOT-S) 8.6-50 MG tablet Take 2 tablets by mouth 2 (two)  times daily as needed for constipation.   Yes [provider]  sodium chloride 0.45 % solution Inject 75 mLs into the vein every hour.   Yes [provider]  Sodium Phosphates (FLEET ENEMA RE) Place 1 each rectally daily as needed (constipation not relieved by bisacodyl suppository).   Yes [provider]  Vitamin D, Ergocalciferol, (DRISDOL) 1.25 MG (50000 UT) CAPS capsule Take 50,000 Units by mouth every 7 (seven) days.   Yes [provider]    Family History Family History  Problem Relation Age of Onset   Asthma Mother    Anesthesia problems Neg Hx    Arthritis Neg Hx    Cancer Neg Hx    Cerebral palsy Neg Hx    Clotting disorder Neg Hx    Club foot Neg Hx    Collagen disease Neg Hx    Deep vein thrombosis Neg Hx    Gait disorder Neg Hx    Heart disease Neg Hx    Gout Neg Hx    Diabetes Neg Hx    Hip dysplasia Neg Hx    Hip fracture Neg Hx  Hypermobility Neg Hx    Hypertension Neg Hx    Osteoporosis Neg Hx    Other Neg Hx    Stroke Neg Hx    Thyroid disease Neg Hx    Vasculitis Neg Hx    Spina bifida Neg Hx    Scoliosis Neg Hx    Rheumatologic disease Neg Hx    Pulmonary embolism Neg Hx     Social History Social History   Tobacco Use   Smoking status: Never Smoker   Smokeless tobacco: Never Used  Substance Use Topics   Alcohol use: Not Currently   Drug use: No     Allergies   Demerol [meperidine]   Review of Systems Review of Systems  Unable to perform ROS: Dementia     Physical Exam Updated Vital Signs BP 117/90    Pulse 98    Temp 99.3 F (37.4 C) (Rectal)    Resp 20    Ht 5\' 5"  (1.651 m)    Wt 54.6 kg    LMP  (LMP Unknown)    SpO2 92%    BMI 20.04 kg/m   Physical Exam Vitals signs and nursing note reviewed.  Constitutional:      Comments: Somnolent.  Opens her eyes when spoken to.  She is able to say 1-2 words.  HENT:     Head: Normocephalic and atraumatic.  Eyes:      Conjunctiva/sclera: Conjunctivae normal.  Neck:     Musculoskeletal: Normal range of motion.  Cardiovascular:     Rate and Rhythm: Normal rate.     Heart sounds: Normal heart sounds.  Pulmonary:     Effort: Pulmonary effort is normal.     Breath sounds: Normal breath sounds.  Abdominal:     Tenderness: There is abdominal tenderness.     Comments: Patient moans when abdomen is touched.  Musculoskeletal:     Comments: There is swelling around the left knee and thigh.  When this area is touched patient moans in pain.  The left leg is warmer than the right leg to touch.  Skin:    General: Skin is warm and dry.  Neurological:     Mental Status: Mental status is at baseline.     Comments: At baseline according to note from facility PA. She opens her eyes to loud voice.  She is able to answer simple one-word answers however requires prompting multiple times.    Psychiatric:        Behavior: Behavior normal.      ED Treatments / Results  Labs (all labs ordered are listed, but only abnormal results are displayed) Labs Reviewed  CBC WITH DIFFERENTIAL/PLATELET - Abnormal; Notable for the following components:      Result Value   WBC 18.0 (*)    RDW 19.6 (*)    Neutro Abs 14.1 (*)    Abs Immature Granulocytes 0.24 (*)    All other components within normal limits  COMPREHENSIVE METABOLIC PANEL - Abnormal; Notable for the following components:   Glucose, Bld 118 (*)    BUN 70 (*)    Creatinine, Ser 1.13 (*)    Total Protein 6.3 (*)    Albumin 2.5 (*)    Alkaline Phosphatase 140 (*)    GFR calc non Af Amer 46 (*)    GFR calc Af Amer 53 (*)    All other components within normal limits  URINALYSIS, ROUTINE W REFLEX MICROSCOPIC - Abnormal; Notable for the following components:   APPearance CLOUDY (*)  Hgb urine dipstick LARGE (*)    Leukocytes,Ua LARGE (*)    RBC / HPF >50 (*)    WBC, UA >50 (*)    Bacteria, UA MANY (*)    All other components within normal limits  LACTIC ACID,  PLASMA - Abnormal; Notable for the following components:   Lactic Acid, Venous 2.9 (*)    All other components within normal limits  LACTIC ACID, PLASMA - Abnormal; Notable for the following components:   Lactic Acid, Venous 2.9 (*)    All other components within normal limits  CULTURE, BLOOD (ROUTINE X 2)  CULTURE, BLOOD (ROUTINE X 2)  URINE CULTURE  SARS CORONAVIRUS 2 (TAT 6-24 HRS)  LIPASE, BLOOD  APTT  PROTIME-INR  BASIC METABOLIC PANEL  CBC  LACTIC ACID, PLASMA  LACTIC ACID, PLASMA    EKG EKG Interpretation  Date/Time:  Tuesday November 17 2018 20:45:08 EDT Ventricular Rate:  103 PR Interval:    QRS Duration: 77 QT Interval:  363 QTC Calculation: 476 R Axis:   -26 Text Interpretation:  Sinus tachycardia Ventricular premature complex Borderline left axis deviation Posterior infarct, old Nonspecific T abnormalities, lateral leads No significant change since last tracing Confirmed by Dorie Rank [phone]) on 11/17/2018 9:06:02 PM   Radiology Dg Chest 1 View  Result Date: 11/17/2018 CLINICAL DATA:  Decreased urine output, history of metastatic carcinoma to lung EXAM: CHEST  1 VIEW COMPARISON:  08/04/2018 FINDINGS: Innumerable bilateral pulmonary nodules consistent with metastatic disease, progressed since 08/04/2018 comparison. No pleural effusion. Normal heart size. Tortuous arch with atherosclerosis. Possible right hilar node. No pneumothorax. Chronic deformity of the right humerus. Post mastectomy changes on the right. IMPRESSION: Innumerable pulmonary nodules consistent with metastatic disease, progressed as compared with prior radiograph from June 2020. Possible right hilar adenopathy. Electronically Signed   By: Donavan Foil M.D.   On: 11/17/2018 20:19   Dg Knee 2 Views Left  Result Date: 11/17/2018 CLINICAL DATA:  Left thigh pain EXAM: LEFT KNEE - 1-2 VIEW COMPARISON:  None FINDINGS: Bones appear osteopenic. No gross fracture or malalignment. Advanced tricompartment  arthritis of the knee with probable trace effusion. Diffuse soft tissue swelling. Vascular calcifications. IMPRESSION: Osteopenia. Advanced tricompartment arthritis without gross evidence for acute fracture Electronically Signed   By: Donavan Foil M.D.   On: 11/17/2018 20:14   Dg Abd 1 View  Result Date: 11/17/2018 CLINICAL DATA:  Left greater than right hip pain EXAM: ABDOMEN - 1 VIEW COMPARISON:  10/18/2017 FINDINGS: Patient is rotated. Surgical clips in the right upper quadrant. Nonobstructed bowel-gas pattern with impacted feces at the rectum. Chronic fracture deformity of the proximal left femur. IMPRESSION: Nonobstructed bowel-gas pattern with impacted feces at the rectum Electronically Signed   By: Donavan Foil M.D.   On: 11/17/2018 20:12   Dg Hip Unilat With Pelvis 2-3 Views Left  Result Date: 11/17/2018 CLINICAL DATA:  Left greater than right hip pain EXAM: DG HIP (WITH OR WITHOUT PELVIS) 2-3V LEFT COMPARISON:  10/18/2017 FINDINGS: Limited by patient positioning. Pubic bones are obscured by rectal stool. Intramedullary rod within the right femur with chronic fracture deformity. Chronic fracture deformity at left femoral neck with progressed bony resorptive changes at the femoral neck and trochanter. Superior and lateral displacement of the residual distal femur with respect to the hip joint. Vascular calcifications. Smooth cortex thickening or periosteal reaction without change. IMPRESSION: 1. Chronic fracture deformity of the left femoral neck with progressed bony resorptive changes at the femoral neck and trochanter, now with  superior and lateral displacement of the residual femoral shaft with respect to the hip joint. 2. Surgical hardware right hip with chronic fracture deformity Electronically Signed   By: Donavan Foil M.D.   On: 11/17/2018 20:16    Procedures .Critical Care Performed by: Lorin Glass, PA-C Authorized by: Lorin Glass, PA-C   Critical care provider  statement:    Critical care time (minutes):  45   Critical care time was exclusive of:  Separately billable procedures and treating other patients and teaching time   Critical care was necessary to treat or prevent imminent or life-threatening deterioration of the following conditions:  Sepsis   Critical care was time spent personally by me on the following activities:  Discussions with consultants, evaluation of patient's response to treatment, examination of patient, ordering and performing treatments and interventions, ordering and review of laboratory studies, ordering and review of radiographic studies, pulse oximetry, re-evaluation of patient's condition, obtaining history from patient or surrogate and review of old charts   (including critical care time)  Medications Ordered in ED Medications  0.9 %  sodium chloride infusion ( Intravenous New Bag/Given 11/17/18 2017)  ceFEPIme (MAXIPIME) 2 g in sodium chloride 0.9 % 100 mL IVPB (0 g Intravenous Stopped 11/17/18 2125)  metroNIDAZOLE (FLAGYL) IVPB 500 mg (0 mg Intravenous Stopped 11/17/18 2338)  vancomycin (VANCOCIN) IVPB 1000 mg/200 mL premix (0 mg Intravenous Stopped 11/17/18 2338)     Initial Impression / Assessment and Plan / ED Course  I have reviewed the triage vital signs and the nursing notes.  Pertinent labs & imaging results that were available during my care of the patient were reviewed by me and considered in my medical decision making (see chart for details).  Clinical Course as of Nov 18 50  Tue Nov 17, 2018  1950 Attempted to contact son x2.  No answer.    [EH]  2014 Attempted to again contact son.  No answer.    [EH]  2025 Verified patient's son's contact information with paper from facility.  Again attempted to call him with no answer.   [EH]  2046 Repeat evaluation tech reports that patient has urine leaking around her Foley.  Order placed for new Foley.   [EH]  2348 Spoke with hospitalist who will see patient  for admission.    [EH]    Clinical Course User Index [EH] Lorin Glass, PA-C      Patient presents today for evaluation from nursing facility for evaluation of decreased urine output and leukocytosis along with poor appetite worsening over the past 3 days.  According to notes from PA who saw patient at facility today her mental status is at her baseline. I attempted to contact her son multiple times throughout her stay in the emergency room and was unable to reach him to discuss goals of care.  On arrival patient is tachycardic at 103.  Rectal temp 99.3.  Labs were obtained and reviewed with a leukocytosis of 18.  Patient meets sirs criteria, concern for sepsis given global picture.  She is started on broad-spectrum antibiotics.  Her lactic acid was elevated, however not over 4 and she was not hypotensive therefore she was not given a full 30/kg fluid bolus.  She is given gentle continuous saline drip for IV rehydration.  Her Foley was noted to be leaking urine around the catheter itself.  This was removed and replaced with UA and culture drawn off new sample.  She does have many bacteria, over  50 whites, over 50 reds and large leukocytosis with clumps of white blood cells.  Patient is unable to provide adequate history therefore do not know if she is symptomatic or not and we will treat her as an acute UTI rather than a colonization given her global picture.  Her creatinine and BUN are both slightly elevated.  Blood and urine cultures were obtained.  Chest x-ray was obtained showing concern for her diffuse metastatic lung cancer without clear acute abnormalities.  1 view abdomen was obtained without obstructive pattern.  Hip x-rays and x-rays of the left femur and knee were obtained showing a left-sided chronic appearing hip fracture.  Her records are reviewed without evidence of this mention prior to today and I do not see where she has had x-rays or pelvic imaging in the past 2 years.     This patient was seen as a shared visit with Dr. Tomi Bamberger.  Coronavirus testing was sent.  I attempted to contact her family multiple times during her visit to discuss goals of care however I was unable to reach any family members.  I spoke with the hospitalist who agreed to admit the patient.  Patient remained hemodynamically stable while in my care.  Final Clinical Impressions(s) / ED Diagnoses   Final diagnoses:  Bacteriuria  Metastatic cancer to lung of unknown cell type [facility])  Closed displaced fracture of left femoral neck [facility])    ED Discharge Orders    None       Ollen Gross 11/18/18 0057    Dorie Rank, MD 11/18/18 2020

## 2018-11-17 NOTE — ED Notes (Addendum)
Date and time results received: 11/17/18 21:31  Test: Lactic Acid  Critical Value: 2.9  Name of Provider Notified: Wyn Quaker, PA   Orders Received? Or Actions Taken?: Will monitor and await for new orders.

## 2018-11-17 NOTE — Progress Notes (Signed)
A consult was received from an ED physician for vanc/cefepime per pharmacy dosing.  The patient's profile has been reviewed for ht/wt/allergies/indication/available labs.   A one time order has been placed for vanc 1g and cefepime 2g.  Further antibiotics/pharmacy consults should be ordered by admitting physician if indicated.                       Thank you, Kara Mead 11/17/2018  8:45 PM

## 2018-11-17 NOTE — Progress Notes (Signed)
Location:  [facility] Room Number: 203-W Place of Service:  SNF [phone]) Provider:  Henreitta Leber, MD  [name], [name]  Extended Emergency Contact Information Primary Emergency Contact: [name] Address: [address]          [place], [place] [address] [name] of [place] Phone: [phone] Relation: Son Secondary Emergency Contact: [place], [place] of [place] Phone: [phone] Relation: Sister  Code Status:  Full Code Goals of care: Advanced Directive information Advanced Directives 08/04/2018  Does Patient Have a Medical Advance Directive? No  Would patient like information on creating a medical advance directive? No - Patient declined     Chief Complaint  Patient presents with  . Acute Visit    Patient seen to follow up on labs and decreased urine output    HPI:  Pt is a 83 y.o. female seen today for an acute visit for follow-up labs with history of decreased urine output as well. She is a long-term resident of facility with a history of metastatic carcinoma to the lung as well as seizure disorder and history of traumatic brain injury-history of hypernatremia as well as depression-anemia-- some history of vaginal bleeding as well as hypertension and failure to thrive.  She does have a significant failure to thrive history with a history of traumatic brain injury as well as metastatic carcinoma to the lung.  She has been on numerous supplements and appeared to be relatively stable considering her numerous challenges.  However  apparently the last few days has seen a change-- her p.o. intake was reduced and actually there is now minimal urinary output-- she does have an indwelling Foley catheter which was changed apparently with no results as far as output.  IV fluids were attempted but apparently this was very challenging  to implement and could not really be obtained in a consistent manner  She does get serial BMPs because of a history of hypernatremia lab was done today which shows her creatinine is up a bit from her baseline at 1.06 and BUN is 59.8 which is roughly double what has been previously Her white count also is  elevated at 15,200.  Sodium is 143.  Vital signs appear to be stable she is afebrile  Patient is a poor historian secondary to dementia-she is alert today but it appears somewhat somnolent-but easily arousable    Past Medical History:  Diagnosis Date  . Anasarca 05/21/2016  . Anxiety 11/03/2016  . Closed TBI (traumatic brain injury) ([facility]) 04/21/2016  . Dysphagia 07/13/2016  . Gout   . Hypertension   . Obesity (BMI 30.0-34.9) 05/21/2016  . Status post insertion of percutaneous endoscopic gastrostomy (PEG) tube ([facility]) 04/21/2016   Past Surgical History:  Procedure Laterality Date  . CHOLECYSTECTOMY N/A 07/21/2017   Procedure: LAPAROSCOPIC CHOLECYSTECTOMY;  Surgeon: Erroll Luna, MD;  Location: [facility];  Service: General;  Laterality: N/A;  . ERCP N/A 02/18/2018   Procedure: ENDOSCOPIC RETROGRADE CHOLANGIOPANCREATOGRAPHY (ERCP);  Surgeon: Milus Banister, MD;  Location: [facility];  Service: Endoscopy;  Laterality: N/A;  . INTRAMEDULLARY (IM) NAIL INTERTROCHANTERIC Right 12/27/2016   Procedure: INTRAMEDULLARY (IM) NAIL INTERTROCHANTRIC RIGHT HIP;  Surgeon: Paralee Cancel, MD;  Location: [facility];  Service: Orthopedics;  Laterality: Right;  . MASTECTOMY    . REMOVAL OF STONES  02/18/2018   Procedure: REMOVAL OF STONES;  Surgeon: Milus Banister, MD;  Location: [facility];  Service: Endoscopy;;  balloon sweep  . SPHINCTEROTOMY  02/18/2018   Procedure: SPHINCTEROTOMY;  Surgeon: Milus Banister, MD;  Location: [facility];  Service: Endoscopy;;    Allergies  Allergen Reactions  . Demerol [Meperidine] Swelling    Outpatient Encounter Medications as of 11/17/2018  Medication Sig  .  acetaminophen (TYLENOL) 325 MG tablet Take 650 mg by mouth every 8 (eight) hours. Routine  . Amino Acids-Protein Hydrolys (FEEDING SUPPLEMENT, PRO-STAT SUGAR FREE 64,) LIQD Take 30 mLs by mouth 2 (two) times daily.   . bimatoprost (LUMIGAN) 0.01 % SOLN Place 1 drop into both eyes at bedtime. For glaucoma  . bisacodyl (DULCOLAX) 10 MG suppository Place 10 mg rectally daily as needed (constipation not relieved by MOM).  . ENSURE (ENSURE) Take 237 mLs by mouth 3 (three) times daily. Prefers chocolate if available  . escitalopram (LEXAPRO) 5 MG tablet Take 5 mg by mouth daily.  . hydrochlorothiazide (HYDRODIURIL) 25 MG tablet Take 12.5 mg by mouth daily. Take 0.5 tablet to = 12.5 mg  . HYDROcodone-acetaminophen (NORCO) 5-325 MG tablet Take 1 tablet by mouth every 12 (twelve) hours.  . levETIRAcetam (KEPPRA) 500 MG tablet Take 500 mg by mouth 2 (two) times daily.   . Magnesium Hydroxide (MILK OF MAGNESIA PO) Take 30 mLs by mouth daily as needed (if no BM in 3 days).  . MULTIPLE VITAMINS-MINERALS PO Take 1 tablet by mouth daily.   . nutrition supplement, JUVEN, (JUVEN) PACK Take 1 packet by mouth 2 (two) times daily between meals. mix with 4-8oz water to aid in wound healing.  . Nutritional Supplements (NUTRITIONAL SUPPLEMENT PO) Take 1 each by mouth daily. Magic Cup with lunch  . potassium chloride SA (K-DUR,KLOR-CON) 20 MEQ tablet Take 20 mEq by mouth daily.   . sennosides-docusate sodium (SENOKOT-S) 8.6-50 MG tablet Take 2 tablets by mouth 2 (two) times daily as needed for constipation.  . Sodium Phosphates (FLEET ENEMA RE) Place 1 each rectally daily as needed (constipation not relieved by bisacodyl suppository).   No facility-administered encounter medications on file as of 11/17/2018.     Review of Systems   Was difficult to obtain because patient is a poor historian but nursing most acute issue has been decreased p.o. intake and decreased urinary output Nursing has not reported complaints of  pain or shortness of breath--no nausea vomiting or increased coughing apparently  Immunization History  Administered Date(s) Administered  . Influenza-Unspecified 12/19/2016, 11/26/2017, 11/11/2018  . PPD Test 04/29/2016  . Pneumococcal Conjugate-13 08/06/2016  . Pneumococcal Polysaccharide-23 04/15/2016  . Tdap 03/31/2016   Pertinent  Health Maintenance Due  Topic Date Due  . DEXA SCAN  07/20/2019 (Originally 10/02/2002)  . INFLUENZA VACCINE  Completed  . PNA vac Low Risk Adult  Completed   Fall Risk  08/06/2017 08/02/2016  Falls in the past year? Yes Yes  Number falls in past yr: - 1  Injury with Fall? Yes Yes  Comment hip fracture R foot   Functional Status Survey:    Vitals:   11/17/18 1637  BP: 124/74  Pulse: 88  Resp: 20  Temp: (!) 96.9 F (36.1 C)  TempSrc: Oral  Weight: 120 lb 6.4 oz (54.6 kg)  Height: 5\' 5"  (1.651 m)   Body mass index is 20.04 kg/m. Physical Exam  In general this is a frail elderly female she is not in any distress but appears somewhat somnolent-with any invasive maneuvers she does arouse.  Her skin is warm and dry  She does have a history of coverings on her feet bilaterally as well as covering of her sacral wound she is followed by wound care nurse and physician assistant from wound care.  Eyes visual acuity appears grossly intact sclera and conjunctive relatively clear.  Oropharynx was difficult to assess since patient did not really open her mouth very wide.  Chest is clear to auscultation with poor respiratory effort somewhat shallow air entry-could not really appreciate labored breathing.  Heart is regular rate and rhythm pulse ranging from the 90s to 100 --she does not have significant lower extremity edema.  Her abdomen is soft but somewhat protuberant and distended bowel sounds are hyperactive  GU she does have an indwelling Foley catheter did not appear to have any output.  Musculoskeletal continues with debility with stiffness  of the lower extremities bilaterally does have some movement of her upper extremities.  Neurologic again she is somewhat somnolent but easily arousable is following some simple verbal commands.  Psych again is following some simple verbal commands --appears to be relatively at baseline with baseline cognitive issues.     Labs reviewed:  November 17, 2018.  WBC 15.2 hemoglobin 12.0 platelets 305.  Sodium 143 potassium 4.2 BUN 59.8 creatinine 1.06.   Recent Labs    02/16/18 2227  02/20/18 0325 02/21/18 0325  08/04/18 1358  09/25/18 10/02/18 10/09/18  NA  --    < > 147* 146*   < > 141   < > 138 142 141  K  --    < > 3.7 3.0*   < > 3.4*   < > 3.7 3.9 3.9  CL  --    < > 116* 112*  --  102  --   --   --   --   CO2  --    < > 24 25  --  30  --   --   --   --   GLUCOSE  --    < > 84 87  --  97  --   --   --   --   BUN  --    < > 10 8   < > 20   < > 15 15 16   CREATININE  --    < > 0.54 0.42*   < > 0.69   < > 0.5 0.5 0.5  CALCIUM  --    < > 7.9* 7.6*  --  8.9  --   --   --   --   MG 2.1  --   --   --   --   --   --   --   --   --    < > = values in this interval not displayed.   Recent Labs    02/20/18 0325 02/21/18 0325 08/04/18 1358 09/18/18  AST 45* 30 17 14   ALT 44 33 12 5*  ALKPHOS 358* 324* 82 90  BILITOT 1.9* 1.1 0.7  --   PROT 5.0* 4.8* 7.2  --   ALBUMIN 2.1* 1.9* 2.9*  --    Recent Labs    02/20/18 0325 02/21/18 0325  08/04/18 1358 08/10/18 09/18/18 10/09/18  WBC 10.8* 7.6   < > 11.2* 11.1 8.8 13.9  NEUTROABS  --   --   --  6.5 7 6 10   HGB 10.4* 10.1*   < > 11.8* 12.6 12.9 10.8*  HCT 35.6* 34.1*   < >  40.3 39 38 34*  MCV 90.4 89.0  --  88.6  --   --   --   PLT 232 192   < > 293 244 268 181   < > = values in this interval not displayed.   Lab Results  Component Value Date   TSH 1.68 08/06/2013   No results found for: HGBA1C No results found for: CHOL, HDL, LDLCALC, LDLDIRECT, TRIG, CHOLHDL  Significant Diagnostic Results in last 30 days:  No results  found.  Assessment/Plan #1-failure to thrive with decreased urinary output and rising BUN as well as creatinine-also elevated white count-we will send her to the ER for evaluation.  Secondary to these multiple issues and risk of further decline.  [place], PA-C [phone]

## 2018-11-18 ENCOUNTER — Other Ambulatory Visit: Payer: Self-pay

## 2018-11-18 ENCOUNTER — Encounter (HOSPITAL_COMMUNITY): Payer: Self-pay

## 2018-11-18 DIAGNOSIS — R1312 Dysphagia, oropharyngeal phase: Secondary | ICD-10-CM | POA: Diagnosis not present

## 2018-11-18 DIAGNOSIS — M84452A Pathological fracture, left femur, initial encounter for fracture: Secondary | ICD-10-CM | POA: Diagnosis present

## 2018-11-18 DIAGNOSIS — R627 Adult failure to thrive: Secondary | ICD-10-CM | POA: Diagnosis present

## 2018-11-18 DIAGNOSIS — I509 Heart failure, unspecified: Secondary | ICD-10-CM | POA: Diagnosis not present

## 2018-11-18 DIAGNOSIS — R531 Weakness: Secondary | ICD-10-CM | POA: Diagnosis not present

## 2018-11-18 DIAGNOSIS — Z20828 Contact with and (suspected) exposure to other viral communicable diseases: Secondary | ICD-10-CM | POA: Diagnosis present

## 2018-11-18 DIAGNOSIS — N390 Urinary tract infection, site not specified: Secondary | ICD-10-CM | POA: Diagnosis present

## 2018-11-18 DIAGNOSIS — F0390 Unspecified dementia without behavioral disturbance: Secondary | ICD-10-CM | POA: Diagnosis present

## 2018-11-18 DIAGNOSIS — Z1612 Extended spectrum beta lactamase (ESBL) resistance: Secondary | ICD-10-CM | POA: Diagnosis present

## 2018-11-18 DIAGNOSIS — G92 Toxic encephalopathy: Secondary | ICD-10-CM | POA: Diagnosis present

## 2018-11-18 DIAGNOSIS — N179 Acute kidney failure, unspecified: Secondary | ICD-10-CM

## 2018-11-18 DIAGNOSIS — C7801 Secondary malignant neoplasm of right lung: Secondary | ICD-10-CM | POA: Diagnosis present

## 2018-11-18 DIAGNOSIS — Z515 Encounter for palliative care: Secondary | ICD-10-CM | POA: Diagnosis present

## 2018-11-18 DIAGNOSIS — C7800 Secondary malignant neoplasm of unspecified lung: Secondary | ICD-10-CM

## 2018-11-18 DIAGNOSIS — I10 Essential (primary) hypertension: Secondary | ICD-10-CM

## 2018-11-18 DIAGNOSIS — Z1611 Resistance to penicillins: Secondary | ICD-10-CM | POA: Diagnosis present

## 2018-11-18 DIAGNOSIS — R569 Unspecified convulsions: Secondary | ICD-10-CM | POA: Diagnosis present

## 2018-11-18 DIAGNOSIS — L89153 Pressure ulcer of sacral region, stage 3: Secondary | ICD-10-CM | POA: Diagnosis present

## 2018-11-18 DIAGNOSIS — G40409 Other generalized epilepsy and epileptic syndromes, not intractable, without status epilepticus: Secondary | ICD-10-CM | POA: Diagnosis not present

## 2018-11-18 DIAGNOSIS — C7802 Secondary malignant neoplasm of left lung: Secondary | ICD-10-CM | POA: Diagnosis present

## 2018-11-18 DIAGNOSIS — S72002A Fracture of unspecified part of neck of left femur, initial encounter for closed fracture: Secondary | ICD-10-CM | POA: Diagnosis not present

## 2018-11-18 DIAGNOSIS — K5641 Fecal impaction: Secondary | ICD-10-CM

## 2018-11-18 DIAGNOSIS — M6281 Muscle weakness (generalized): Secondary | ICD-10-CM | POA: Diagnosis not present

## 2018-11-18 DIAGNOSIS — B9629 Other Escherichia coli [E. coli] as the cause of diseases classified elsewhere: Secondary | ICD-10-CM | POA: Diagnosis not present

## 2018-11-18 DIAGNOSIS — E43 Unspecified severe protein-calorie malnutrition: Secondary | ICD-10-CM | POA: Diagnosis not present

## 2018-11-18 DIAGNOSIS — M109 Gout, unspecified: Secondary | ICD-10-CM | POA: Diagnosis present

## 2018-11-18 DIAGNOSIS — E870 Hyperosmolality and hypernatremia: Secondary | ICD-10-CM | POA: Diagnosis not present

## 2018-11-18 DIAGNOSIS — E876 Hypokalemia: Secondary | ICD-10-CM | POA: Diagnosis present

## 2018-11-18 DIAGNOSIS — T83511D Infection and inflammatory reaction due to indwelling urethral catheter, subsequent encounter: Secondary | ICD-10-CM | POA: Diagnosis not present

## 2018-11-18 DIAGNOSIS — Z87898 Personal history of other specified conditions: Secondary | ICD-10-CM | POA: Diagnosis not present

## 2018-11-18 DIAGNOSIS — E872 Acidosis: Secondary | ICD-10-CM | POA: Diagnosis not present

## 2018-11-18 DIAGNOSIS — T83511A Infection and inflammatory reaction due to indwelling urethral catheter, initial encounter: Secondary | ICD-10-CM

## 2018-11-18 DIAGNOSIS — R6520 Severe sepsis without septic shock: Secondary | ICD-10-CM | POA: Diagnosis not present

## 2018-11-18 DIAGNOSIS — C801 Malignant (primary) neoplasm, unspecified: Secondary | ICD-10-CM | POA: Diagnosis present

## 2018-11-18 DIAGNOSIS — N170 Acute kidney failure with tubular necrosis: Secondary | ICD-10-CM | POA: Diagnosis not present

## 2018-11-18 DIAGNOSIS — A419 Sepsis, unspecified organism: Secondary | ICD-10-CM | POA: Diagnosis present

## 2018-11-18 DIAGNOSIS — F419 Anxiety disorder, unspecified: Secondary | ICD-10-CM | POA: Diagnosis present

## 2018-11-18 DIAGNOSIS — M810 Age-related osteoporosis without current pathological fracture: Secondary | ICD-10-CM | POA: Diagnosis not present

## 2018-11-18 DIAGNOSIS — Z1629 Resistance to other single specified antibiotic: Secondary | ICD-10-CM | POA: Diagnosis present

## 2018-11-18 LAB — BASIC METABOLIC PANEL
Anion gap: 10 (ref 5–15)
BUN: 61 mg/dL — ABNORMAL HIGH (ref 8–23)
CO2: 20 mmol/L — ABNORMAL LOW (ref 22–32)
Calcium: 7.3 mg/dL — ABNORMAL LOW (ref 8.9–10.3)
Chloride: 114 mmol/L — ABNORMAL HIGH (ref 98–111)
Creatinine, Ser: 0.92 mg/dL (ref 0.44–1.00)
GFR calc Af Amer: >60 mL/min (ref >60)
GFR calc non Af Amer: 58 mL/min — ABNORMAL LOW (ref >60)
Glucose, Bld: 95 mg/dL (ref 70–99)
Potassium: 3.4 mmol/L — ABNORMAL LOW (ref 3.5–5.1)
Sodium: 144 mmol/L (ref 135–145)

## 2018-11-18 LAB — CBC
HCT: 35.7 % — ABNORMAL LOW (ref 36.0–46.0)
Hemoglobin: 10.9 g/dL — ABNORMAL LOW (ref 12.0–15.0)
MCH: 28.7 pg (ref 26.0–34.0)
MCHC: 30.5 g/dL (ref 30.0–36.0)
MCV: 93.9 fL (ref 80.0–100.0)
Platelets: 314 10*3/uL (ref 150–400)
RBC: 3.8 MIL/uL — ABNORMAL LOW (ref 3.87–5.11)
RDW: 19.3 % — ABNORMAL HIGH (ref 11.5–15.5)
WBC: 16.1 10*3/uL — ABNORMAL HIGH (ref 4.0–10.5)
nRBC: 0.1 % (ref 0.0–0.2)

## 2018-11-18 LAB — LACTIC ACID, PLASMA
Lactic Acid, Venous: 2.6 mmol/L (ref 0.5–1.9)
Lactic Acid, Venous: 2.4 mmol/L (ref 0.5–1.9)

## 2018-11-18 LAB — PROTIME-INR
INR: 1.1 (ref 0.8–1.2)
Prothrombin Time: 14 seconds (ref 11.4–15.2)

## 2018-11-18 LAB — CK: Total CK: 18 U/L — ABNORMAL LOW (ref 38–234)

## 2018-11-18 LAB — SARS CORONAVIRUS 2 (TAT 6-24 HRS): SARS Coronavirus 2: NEGATIVE

## 2018-11-18 LAB — APTT: aPTT: 32 seconds (ref 24–36)

## 2018-11-18 MED ORDER — BISACODYL 10 MG RE SUPP
10.0000 mg | Freq: Once | RECTAL | Status: AC
  Administered 2018-11-18: 10 mg via RECTAL
  Filled 2018-11-18: qty 1

## 2018-11-18 MED ORDER — SENNOSIDES-DOCUSATE SODIUM 8.6-50 MG PO TABS: 2.0000 | ORAL_TABLET | Freq: Two times a day (BID) | ORAL | Status: DC | PRN

## 2018-11-18 MED ORDER — MORPHINE SULFATE (PF) 2 MG/ML IV SOLN
1.0000 mg | INTRAVENOUS | Status: DC | PRN
  Administered 2018-11-18 – 2018-11-22 (×10): 1 mg via INTRAVENOUS
  Filled 2018-11-18 (×10): qty 1

## 2018-11-18 MED ORDER — SODIUM CHLORIDE 0.9 % IV SOLN
INTRAVENOUS | Status: DC
  Administered 2018-11-18 – 2018-11-19 (×3): via INTRAVENOUS

## 2018-11-18 MED ORDER — CHLORHEXIDINE GLUCONATE CLOTH 2 % EX PADS
6.0000 | MEDICATED_PAD | Freq: Every day | CUTANEOUS | Status: DC
  Administered 2018-11-18 – 2018-11-22 (×5): 6 via TOPICAL

## 2018-11-18 MED ORDER — LEVETIRACETAM IN NACL 500 MG/100ML IV SOLN
500.0000 mg | Freq: Two times a day (BID) | INTRAVENOUS | Status: DC
  Administered 2018-11-18 – 2018-11-22 (×8): 500 mg via INTRAVENOUS
  Filled 2018-11-18 (×10): qty 100

## 2018-11-18 MED ORDER — POTASSIUM CHLORIDE CRYS ER 20 MEQ PO TBCR
20.0000 meq | EXTENDED_RELEASE_TABLET | Freq: Every day | ORAL | Status: DC
  Administered 2018-11-18 – 2018-11-21 (×2): 20 meq via ORAL
  Filled 2018-11-18 (×4): qty 1

## 2018-11-18 MED ORDER — SODIUM CHLORIDE 0.9 % IV SOLN
1.0000 g | Freq: Two times a day (BID) | INTRAVENOUS | Status: DC
  Administered 2018-11-18 – 2018-11-22 (×10): 1 g via INTRAVENOUS
  Filled 2018-11-18 (×11): qty 1

## 2018-11-18 MED ORDER — HYDROCODONE-ACETAMINOPHEN 5-325 MG PO TABS
1.0000 | ORAL_TABLET | Freq: Two times a day (BID) | ORAL | Status: DC
  Administered 2018-11-18 (×2): 1 via ORAL
  Filled 2018-11-18 (×2): qty 1

## 2018-11-18 MED ORDER — POLYETHYLENE GLYCOL 3350 17 G PO PACK
17.0000 g | PACK | Freq: Every day | ORAL | Status: DC
  Administered 2018-11-18 – 2018-11-19 (×2): 17 g via ORAL
  Filled 2018-11-18 (×3): qty 1

## 2018-11-18 MED ORDER — HYDROCODONE-ACETAMINOPHEN 5-325 MG PO TABS: 2.0000 | ORAL_TABLET | Freq: Once | ORAL | Status: DC

## 2018-11-18 MED ORDER — HYDROCODONE-ACETAMINOPHEN 5-325 MG PO TABS
1.0000 | ORAL_TABLET | Freq: Four times a day (QID) | ORAL | Status: DC | PRN
  Administered 2018-11-19: 1 via ORAL
  Filled 2018-11-18 (×2): qty 1

## 2018-11-18 MED ORDER — ENOXAPARIN SODIUM 40 MG/0.4ML ~~LOC~~ SOLN
40.0000 mg | SUBCUTANEOUS | Status: DC
  Administered 2018-11-18 – 2018-11-22 (×5): 40 mg via SUBCUTANEOUS
  Filled 2018-11-18 (×6): qty 0.4

## 2018-11-18 MED ORDER — LEVETIRACETAM 500 MG PO TABS
500.0000 mg | ORAL_TABLET | Freq: Two times a day (BID) | ORAL | Status: DC
  Administered 2018-11-18 (×2): 500 mg via ORAL
  Filled 2018-11-18 (×2): qty 1

## 2018-11-18 MED ORDER — ESCITALOPRAM OXALATE 10 MG PO TABS
5.0000 mg | ORAL_TABLET | Freq: Every day | ORAL | Status: DC
  Administered 2018-11-18 – 2018-11-21 (×4): 5 mg via ORAL
  Filled 2018-11-18 (×5): qty 1

## 2018-11-18 MED ORDER — MORPHINE SULFATE (PF) 2 MG/ML IV SOLN
2.0000 mg | Freq: Once | INTRAVENOUS | Status: AC
  Administered 2018-11-18: 1 mg via INTRAVENOUS

## 2018-11-18 NOTE — ED Notes (Signed)
Medications crushed and mixed in applesauce. Patient has difficulty swallowing. Able to drink water through straw well. Mixed miralax in water. Patient drank without difficulty.

## 2018-11-18 NOTE — Progress Notes (Addendum)
Clinical/Bedside Swallow Evaluation Patient Details  Name: Autumn Bryant MRN: [id number] Date of Birth: October 27, 1935  Today's Date: 11/18/2018 Time: SLP Start Time (ACUTE ONLY): 1950 SLP Stop Time (ACUTE ONLY): 1333 SLP Time Calculation (min) (ACUTE ONLY): 28 min  Past Medical History:  Past Medical History:  Diagnosis Date  . Anasarca 05/21/2016  . Anxiety 11/03/2016  . Closed TBI (traumatic brain injury) ([facility]) 04/21/2016  . Dysphagia 07/13/2016  . Gout   . Hypertension   . Obesity (BMI 30.0-34.9) 05/21/2016  . Status post insertion of percutaneous endoscopic gastrostomy (PEG) tube ([facility]) 04/21/2016   Past Surgical History:  Past Surgical History:  Procedure Laterality Date  . CHOLECYSTECTOMY N/A 07/21/2017   Procedure: LAPAROSCOPIC CHOLECYSTECTOMY;  Surgeon: Erroll Luna, MD;  Location: [facility];  Service: General;  Laterality: N/A;  . ERCP N/A 02/18/2018   Procedure: ENDOSCOPIC RETROGRADE CHOLANGIOPANCREATOGRAPHY (ERCP);  Surgeon: Milus Banister, MD;  Location: [facility];  Service: Endoscopy;  Laterality: N/A;  . INTRAMEDULLARY (IM) NAIL INTERTROCHANTERIC Right 12/27/2016   Procedure: INTRAMEDULLARY (IM) NAIL INTERTROCHANTRIC RIGHT HIP;  Surgeon: Paralee Cancel, MD;  Location: [facility];  Service: Orthopedics;  Laterality: Right;  . MASTECTOMY    . REMOVAL OF STONES  02/18/2018   Procedure: REMOVAL OF STONES;  Surgeon: Milus Banister, MD;  [facility]: [facility];  Service: Endoscopy;;  balloon sweep  . SPHINCTEROTOMY  02/18/2018   Procedure: SPHINCTEROTOMY;  Surgeon: Milus Banister, MD;  Location: [facility];  Service: Endoscopy;;   HPI:  83 yo female resident of SNF with h/o lung cancer and dementia admitted with FTT and decreased mental status - ? UTI with h/o ESBL-UTI .  Concern for possible UTI.  Pt has had prior admits for FTT and old CT 02/17/2018 showed atrophy, old right frontal encephalomalacia, CXR 02/16/2018 left base scarring.  Pt also has a lung malignant neoplasm,  pressure ulcer Stage 3, seizure d/o.  She has fecal rectum impaction per Abdomen 11/17/2018.     Assessment / Plan / Recommendation Clinical Impression  Pt presents currently with cognitive based dysphagia likely exacerbated by current medical issue.  Pt with poor positioning as she leaned her head to the right and moaned with any attempts to reposition her. She did allow HOB to be elevated.  Provided her with intake of tsps of applesauce- clinical indication of oral holding, delayed transiting noted.  Suspect pharyngeal swallow largely intact.  At this time due to pt's AMS and concern for premorbid dysphagia, recommend NPO except medication with applesauce.  SLP set up oral suction for RN usage after intake.    Of note, SLP reached Nursing Lauren at facility and was advised pt was on a soft/thin diet and had poor intake over the last few weeks. She takes medications crushed with applesauce and would drink approx 1/2 of her Ensures.    Will follow up for po readiness as mentation improves.   SLP Visit Diagnosis: Dysphagia, oral phase (R13.11)    Aspiration Risk  Moderate aspiration risk;Severe aspiration risk    Diet Recommendation NPO;NPO except meds   Medication Administration: Crushed with puree Supervision: Full supervision/cueing for compensatory strategies Compensations: Slow rate;Small sips/bites(oral suction after intake, dry spoon to trigger swallow) Postural Changes: Seated upright at 90 degrees;Remain upright for at least 30 minutes after po intake    Other  Recommendations Oral Care Recommendations: Oral care QID   Follow up Recommendations    tbd  Recommend palliative referral     Frequency and Duration  tbd         Prognosis Prognosis for Safe Diet Advancement: Fair      Swallow Study   General Date of Onset: 11/18/18 HPI: 83 yo female resident of SNF with h/o lung cancer and dementia admitted with FTT and decreased mental status - ? UTI with h/o ESBL-UTI .   Concern for possible UTI.  Pt has had prior admits for FTT and old CT 02/17/2018 showed atrophy, old right frontal encephalomalacia, CXR 02/16/2018 left base scarring.  Pt also has a lung malignant neoplasm, pressure ulcer Stage 3, seizure d/o.  She has fecal rectum impaction per Abdomen 11/17/2018.   Type of Study: Bedside Swallow Evaluation Previous Swallow Assessment: none Diet Prior to this Study: NPO Temperature Spikes Noted: No Respiratory Status: Room air History of Recent Intubation: No Behavior/Cognition: Lethargic/Drowsy;Doesn't follow directions Oral Cavity Assessment: Dry Oral Cavity - Dentition: Poor condition Vision: Functional for self-feeding Self-Feeding Abilities: Total assist Patient Positioning: Partially reclined(head leaning to the right, pt resistant to movement) Baseline Vocal Quality: Normal Volitional Cough: Cognitively unable to elicit Volitional Swallow: Unable to elicit    Oral/Motor/Sensory Function Overall Oral Motor/Sensory Function: Generalized oral weakness(pt did not follow directions but able to seal lips) Velum: Other (comment)(dnt, pt did not open mouth adequately)   Ice Chips Ice chips: (pt did not accept and concern for aspiration risk is present)   Thin Liquid Thin Liquid: Not tested    Nectar Thick Nectar Thick Liquid: Not tested   Honey Thick Honey Thick Liquid: Not tested   Puree Puree: Impaired Presentation: Spoon Oral Phase Impairments: Reduced lingual movement/coordination;Poor awareness of bolus Oral Phase Functional Implications: Other (comment) Pharyngeal Phase Impairments: Suspected delayed Swallow Other Comments: slp orally suctioned pt after po trials with findings of retained secretions   Solid     Solid: Not tested      Macario Golds 11/18/2018,2:29 PM  Luanna Salk, MS [facility] SLP Acute Rehab Services Pager [phone] Office [phone]

## 2018-11-18 NOTE — H&P (Signed)
History and Physical    Autumn Bryant YHC:[id number] DOB: [birth year] DOA: 11/17/2018  PCP: [name], MD  Patient coming from: [facility]  I have personally briefly reviewed patient's old medical records in [place]  Chief Complaint: decrease intake and urinary retention  HPI: Autumn Bryant is a 83 y.o. female with medical history significant of advanced dementia with failure to thrive, hx of traumatic brain injury with seizures, metastatic lung cancer, ESBL UTI with chronic indwelling foley who presented with decrease PO intake and minimal urinary output. No family at bedside and patient unable to provide history.  Per [facility] home physician documentation pt noted to have a change in her PO intake in the last few days and no urinary output to her chronic indwelling foley catheter. IV fluids reportedly attempted but could be given consistently. BMP performed showed creatinine of 1.06 and BUN 59. WBC of 15,200 and sodium of 143. Decision was made to send to ED for further evaluation due to potential for decline.   ED Course: She was afebrile and normotensive on room air.  Although mildly hypertensive up to 103.  CBC with WBC of 18 and no anemia.  CMP showed elevated creatinine of 1.13 from a prior of 0.5.  Lactate of 2.9.  Urinalysis shows large leukocyte and negative nitrite with many bacteria.  Abdominal x-ray showed impacted feces at the rectum.This x-ray showed innumerable bilateral pulmonary nodules consistent with metastatic disease that has progressed since x-ray in June.  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  It shows chronic fracture deformity of the left femoral head and surgical hardware of the right hip.  Left knee showed osteopenia with no acute fracture. Pt's foley was replaced in the ED and urinary output was noted. Patient was started on vancomycin, Flagyl and cefepime by ED PA.  Review of  Systems: Unable to obtain due to patient's dementia  Past Medical History:  Diagnosis Date   Anasarca 05/21/2016   Anxiety 11/03/2016   Closed TBI (traumatic brain injury) ([place]) 04/21/2016   Dysphagia 07/13/2016   Gout    Hypertension    Obesity (BMI 30.0-34.9) 05/21/2016   Status post insertion of percutaneous endoscopic gastrostomy (PEG) tube ([place]) 04/21/2016    Past Surgical History:  Procedure Laterality Date   CHOLECYSTECTOMY N/A 07/21/2017   Procedure: LAPAROSCOPIC CHOLECYSTECTOMY;  Surgeon: Erroll Luna, MD;  Location: [facility];  Service: General;  Laterality: N/A;   ERCP N/A 02/18/2018   Procedure: ENDOSCOPIC RETROGRADE CHOLANGIOPANCREATOGRAPHY (ERCP);  Surgeon: Milus Banister, MD;  Location: [facility];  Service: Endoscopy;  Laterality: N/A;   INTRAMEDULLARY (IM) NAIL INTERTROCHANTERIC Right 12/27/2016   Procedure: INTRAMEDULLARY (IM) NAIL INTERTROCHANTRIC RIGHT HIP;  Surgeon: Paralee Cancel, MD;  Location: [facility];  Service: Orthopedics;  Laterality: Right;   MASTECTOMY     REMOVAL OF STONES  02/18/2018   Procedure: REMOVAL OF STONES;  Surgeon: Milus Banister, MD;  Location: [facility];  Service: Endoscopy;;  balloon sweep   SPHINCTEROTOMY  02/18/2018   Procedure: Joan Mayans;  Surgeon: Milus Banister, MD;  Location: [facility];  Service: Endoscopy;;     reports that she has never smoked. She has never used smokeless tobacco. She reports previous alcohol use. She reports that she does not use drugs.  Allergies  Allergen Reactions   Demerol [Meperidine] Swelling    Family History  Problem Relation Age of Onset   Asthma Mother  Anesthesia problems Neg Hx    Arthritis Neg Hx    Cancer Neg Hx    Cerebral palsy Neg Hx    Clotting disorder Neg Hx    Club foot Neg Hx    Collagen disease Neg Hx    Deep vein thrombosis Neg Hx    Gait disorder Neg Hx    Heart disease Neg Hx    Gout Neg Hx    Diabetes Neg Hx    Hip dysplasia Neg Hx      Hip fracture Neg Hx    Hypermobility Neg Hx    Hypertension Neg Hx    Osteoporosis Neg Hx    Other Neg Hx    Stroke Neg Hx    Thyroid disease Neg Hx    Vasculitis Neg Hx    Spina bifida Neg Hx    Scoliosis Neg Hx    Rheumatologic disease Neg Hx    Pulmonary embolism Neg Hx    Family history reviewed and not pertinent   Prior to Admission medications   Medication Sig Start Date End Date Taking? Authorizing Provider  acetaminophen (TYLENOL) 325 MG tablet Take 650 mg by mouth every 8 (eight) hours. Routine   Yes [provider]  Amino Acids-Protein Hydrolys (FEEDING SUPPLEMENT, PRO-STAT SUGAR FREE 64,) LIQD Take 30 mLs by mouth 2 (two) times daily.    Yes [provider]  bimatoprost (LUMIGAN) 0.01 % SOLN Place 1 drop into both eyes at bedtime. For glaucoma   Yes [provider]  bisacodyl (DULCOLAX) 10 MG suppository Place 10 mg rectally daily as needed (constipation not relieved by MOM).   Yes [provider]  ENSURE (ENSURE) Take 237 mLs by mouth 3 (three) times daily. Prefers chocolate if available   Yes [provider]  escitalopram (LEXAPRO) 5 MG tablet Take 5 mg by mouth daily.   Yes [provider]  hydrochlorothiazide (HYDRODIURIL) 25 MG tablet Take 12.5 mg by mouth daily. Take 0.5 tablet to = 12.5 mg   Yes [provider]  HYDROcodone-acetaminophen (NORCO) 5-325 MG tablet Take 1 tablet by mouth every 12 (twelve) hours. 11/10/18  Yes [name], MD  levETIRAcetam (KEPPRA) 500 MG tablet Take 500 mg by mouth 2 (two) times daily.    Yes [provider]  Magnesium Hydroxide (MILK OF MAGNESIA PO) Take 30 mLs by mouth daily as needed (if no BM in 3 days).   Yes [provider]  MULTIPLE VITAMINS-MINERALS PO Take 1 tablet by mouth daily.    Yes [provider]  Nutritional Supplements (NUTRITIONAL SUPPLEMENT PO) Take 1 each by mouth daily. Magic Cup with lunch   Yes [provider]  potassium chloride SA (K-DUR,KLOR-CON) 20 MEQ tablet Take 20 mEq by mouth daily.    Yes [provider]  sennosides-docusate sodium (SENOKOT-S) 8.6-50 MG tablet Take 2 tablets by mouth 2 (two) times daily as needed for constipation.   Yes [provider]  sodium chloride 0.45 % solution Inject 75 mLs into the vein every hour.   Yes [provider]  Sodium Phosphates (FLEET ENEMA RE) Place 1 each rectally daily as needed (constipation not relieved by bisacodyl suppository).   Yes [provider]  Vitamin D, Ergocalciferol, (DRISDOL) 1.25 MG (50000 UT) CAPS capsule Take 50,000 Units by mouth every 7 (seven) days.   Yes [provider]    Physical Exam: Vitals:   11/17/18 2111 11/17/18 2300 11/17/18 2330 11/18/18 0100  BP: 118/79 128/87 117/90  113/88  Pulse: (!) 101 98  (!) 103  Resp: 18 20 20  (!) 29  Temp:      TempSrc:      SpO2: 97% 92%  98%  Weight:      Height:        Constitutional: elderly cachetic female with eyes closed laying in bed mumbling incoherent words. Pt would scream in pain with light palpation of anywhere in her body.  Vitals:   11/17/18 2111 11/17/18 2300 11/17/18 2330 11/18/18 0100  BP: 118/79 128/87 117/90 113/88  Pulse: (!) 101 98  (!) 103  Resp: 18 20 20  (!) 29  Temp:      TempSrc:      SpO2: 97% 92%  98%  Weight:      Height:       Eyes: unable to assess since patient would not open eyes when asked, also attempted to keep them shut when attempts were made to open them. ENMT: Mucous membranes are moist. Posterior pharynx clear of any exudate or lesions.Normal dentition.  Neck: normal, supple, no masses Respiratory: clear to auscultation anteriorly, no wheezing, no crackles. Normal respiratory effort. No accessory muscle use.  Cardiovascular: Regular rate and rhythm, no murmurs / rubs / gallops. No extremity edema. 2+ pedal pulses. No carotid bruits.  Abdomen: pt yelled in pain with light palpation  of the entire abdomen, moderate distention, no masses palpated.  Bowel sounds positive.  Musculoskeletal: no clubbing / cyanosis. Medial rotation of the entire left lower leg with foot bandaging and soft foot brace. Soft foot brace to left LE as well.  Skin: no rashes, lesions, ulcers. No induration Neurologic: Unable to fully assess due to her dementia.  She appears to understand some simple commands but was not compliant with them. Example, when asked to open her eyes patient starts that she is "trying," but would keep them shut  Psychiatric: Alert but not oriented.    Labs on Admission: I have personally reviewed following labs and imaging studies  CBC: Recent Labs  Lab 11/17/18 2004  WBC 18.0*  NEUTROABS 14.1*  HGB 13.3  HCT 42.5  MCV 93.8  PLT 737   Basic Metabolic Panel: Recent Labs  Lab 11/17/18 2004  NA 143  K 4.3  CL 107  CO2 23  GLUCOSE 118*  BUN 70*  CREATININE 1.13*  CALCIUM 9.0   GFR: Estimated Creatinine Clearance: 33.7 mL/min (A) (by C-G formula based on SCr of 1.13 mg/dL (H)). Liver Function Tests: Recent Labs  Lab 11/17/18 2004  AST 25  ALT 11  ALKPHOS 140*  BILITOT 0.8  PROT 6.3*  ALBUMIN 2.5*   Recent Labs  Lab 11/17/18 2004  LIPASE 17   No results for input(s): AMMONIA in the last 168 hours. Coagulation Profile: Recent Labs  Lab 11/17/18 2034  INR 1.1   Cardiac Enzymes: No results for input(s): CKTOTAL, CKMB, CKMBINDEX, TROPONINI in the last 168 hours. BNP (last 3 results) No results for input(s): PROBNP in the last 8760 hours. HbA1C: No results for input(s): HGBA1C in the last 72 hours. CBG: No results for input(s): GLUCAP in the last 168 hours. Lipid Profile: No results for input(s): CHOL, HDL, LDLCALC, TRIG, CHOLHDL, LDLDIRECT in the last 72 hours. Thyroid Function Tests: No results for input(s): TSH, T4TOTAL, FREET4, T3FREE, THYROIDAB in the last 72 hours. Anemia Panel: No results for input(s): VITAMINB12, FOLATE,  FERRITIN, TIBC, IRON, RETICCTPCT in the last 72 hours. Urine analysis:    Component Value Date/Time  COLORURINE YELLOW 11/17/2018 2004   APPEARANCEUR CLOUDY (A) 11/17/2018 2004   LABSPEC 1.016 11/17/2018 2004   PHURINE 5.0 11/17/2018 2004   GLUCOSEU NEGATIVE 11/17/2018 2004   HGBUR LARGE (A) 11/17/2018 2004   BILIRUBINUR NEGATIVE 11/17/2018 2004   [place] 11/17/2018 2004   PROTEINUR NEGATIVE 11/17/2018 2004   NITRITE NEGATIVE 11/17/2018 2004   LEUKOCYTESUR LARGE (A) 11/17/2018 2004    Radiological Exams on Admission: Dg Chest 1 View  Result Date: 11/17/2018 CLINICAL DATA:  Decreased urine output, history of metastatic carcinoma to lung EXAM: CHEST  1 VIEW COMPARISON:  08/04/2018 FINDINGS: Innumerable bilateral pulmonary nodules consistent with metastatic disease, progressed since 08/04/2018 comparison. No pleural effusion. Normal heart size. Tortuous arch with atherosclerosis. Possible right hilar node. No pneumothorax. Chronic deformity of the right humerus. Post mastectomy changes on the right. IMPRESSION: Innumerable pulmonary nodules consistent with metastatic disease, progressed as compared with prior radiograph from June 2020. Possible right hilar adenopathy. Electronically Signed   By: Donavan Foil M.D.   On: 11/17/2018 20:19   Dg Knee 2 Views Left  Result Date: 11/17/2018 CLINICAL DATA:  Left thigh pain EXAM: LEFT KNEE - 1-2 VIEW COMPARISON:  None FINDINGS: Bones appear osteopenic. No gross fracture or malalignment. Advanced tricompartment arthritis of the knee with probable trace effusion. Diffuse soft tissue swelling. Vascular calcifications. IMPRESSION: Osteopenia. Advanced tricompartment arthritis without gross evidence for acute fracture Electronically Signed   By: Donavan Foil M.D.   On: 11/17/2018 20:14   Dg Abd 1 View  Result Date: 11/17/2018 CLINICAL DATA:  Left greater than right hip pain EXAM: ABDOMEN - 1 VIEW COMPARISON:  10/18/2017 FINDINGS: Patient  is rotated. Surgical clips in the right upper quadrant. Nonobstructed bowel-gas pattern with impacted feces at the rectum. Chronic fracture deformity of the proximal left femur. IMPRESSION: Nonobstructed bowel-gas pattern with impacted feces at the rectum Electronically Signed   By: Donavan Foil M.D.   On: 11/17/2018 20:12   Dg Hip Unilat With Pelvis 2-3 Views Left  Result Date: 11/17/2018 CLINICAL DATA:  Left greater than right hip pain EXAM: DG HIP (WITH OR WITHOUT PELVIS) 2-3V LEFT COMPARISON:  10/18/2017 FINDINGS: Limited by patient positioning. Pubic bones are obscured by rectal stool. Intramedullary rod within the right femur with chronic fracture deformity. Chronic fracture deformity at left femoral neck with progressed bony resorptive changes at the femoral neck and trochanter. Superior and lateral displacement of the residual distal femur with respect to the hip joint. Vascular calcifications. Smooth cortex thickening or periosteal reaction without change. IMPRESSION: 1. Chronic fracture deformity of the left femoral neck with progressed bony resorptive changes at the femoral neck and trochanter, now with superior and lateral displacement of the residual femoral shaft with respect to the hip joint. 2. Surgical hardware right hip with chronic fracture deformity Electronically Signed   By: Donavan Foil M.D.   On: 11/17/2018 20:16    EKG: Independently reviewed.   Assessment/Plan  Sepsis secondary to complicated UTI - hx of ESBL -UTI and chronic indwelling catheter -UA positive for infection.  Urine culture pending -Patient initially given broad spectrum antibiotics in the ED.  Do not suspect other source of infection given patient is not hypoxic and her abdominal pain is most likely coming from fecal impaction -Will switch to meropenem per pharmacy consult - continue to trend lactate - check CPK as well since pt did not tolerate light touch anywhere in her body without screaming in  pain  AKI - likely due to decrease  PO intake - continue IV fluids  Constipation with Fecal impaction - Bisacodyl suppository - Miralax daily  -senna-docusate daily   Sacrum wound - noted on nursing home documentation and nursing in ED. - Unfortunately pt did not allow for my eval due to pain - wound care per RN   Hypertension - hold HCTZ since she is getting IV fluids   Hx of seizure - continue Keppra  Failure to thrive - nutrition consult      DVT prophylaxis:.Lovenox Code Status:Full Family Communication: No family at bedside disposition Plan: Home with at least 2 midnight stays  Consults called:  Admission status: inpatient   Halden Phegley T Quinnley Colasurdo DO [facility]   If 7PM-7AM, please contact night-coverage [email or web address] Password [email or web address]  11/18/2018, 2:14 AM

## 2018-11-18 NOTE — ED Notes (Signed)
ED TO INPATIENT HANDOFF REPORT  ED Nurse Name and Phone #:  Hulbert Branscome [phone]  S Name/Age/Gender Autumn Bryant 83 y.o. female Room/Bed: WA12/WA12  Code Status   Code Status: Full Code  Home/SNF/Other Nursing Home Patient oriented to: none Is this baseline? Yes   Triage Complete: Triage complete  Chief Complaint Abnormal Labs  Triage Note Per EMS, patient comes from [facility], for c/o elevated WBC. EMS unable to report specific numbers. EMS reports patient has not had nutrition or hydration in 3 days.  Patient presents with confusion. Has indwelling catheter (chronic).    Allergies Allergies  Allergen Reactions  . Demerol [Meperidine] Swelling    Level of Care/Admitting Diagnosis ED Disposition    ED Disposition Condition [facility] Area: [place] [[id number]]  Level of Care: Med-Surg [16]  Covid Evaluation: Asymptomatic Screening Protocol (No Symptoms)  Diagnosis: UTI (urinary tract infection) [622633]  Admitting Physician: Orene Desanctis [[id number]]  Attending Physician: Orene Desanctis [[id number]]  Estimated length of stay: past midnight tomorrow  Certification:: I certify this patient will need inpatient services for at least 2 midnights  PT Class (Do Not Modify): Inpatient [101]  PT Acc Code (Do Not Modify): Private [1]       B Medical/Surgery History Past Medical History:  Diagnosis Date  . Anasarca 05/21/2016  . Anxiety 11/03/2016  . Closed TBI (traumatic brain injury) ([place]) 04/21/2016  . Dysphagia 07/13/2016  . Gout   . Hypertension   . Obesity (BMI 30.0-34.9) 05/21/2016  . Status post insertion of percutaneous endoscopic gastrostomy (PEG) tube ([place]) 04/21/2016   Past Surgical History:  Procedure Laterality Date  . CHOLECYSTECTOMY N/A 07/21/2017   Procedure: LAPAROSCOPIC CHOLECYSTECTOMY;  Surgeon: Erroll Luna, MD;  Location: [place];  Service: General;  Laterality: N/A;  . ERCP N/A 02/18/2018   Procedure: ENDOSCOPIC RETROGRADE  CHOLANGIOPANCREATOGRAPHY (ERCP);  Surgeon: Milus Banister, MD;  Location: [facility];  Service: Endoscopy;  Laterality: N/A;  . INTRAMEDULLARY (IM) NAIL INTERTROCHANTERIC Right 12/27/2016   Procedure: INTRAMEDULLARY (IM) NAIL INTERTROCHANTRIC RIGHT HIP;  Surgeon: Paralee Cancel, MD;  Location: [facility];  Service: Orthopedics;  Laterality: Right;  . MASTECTOMY    . REMOVAL OF STONES  02/18/2018   Procedure: REMOVAL OF STONES;  Surgeon: Milus Banister, MD;  Location: [facility];  Service: Endoscopy;;  balloon sweep  . SPHINCTEROTOMY  02/18/2018   Procedure: SPHINCTEROTOMY;  Surgeon: Milus Banister, MD;  Location: [facility];  Service: Endoscopy;;     A IV Location/Drains/Wounds Patient Lines/Drains/Airways Status   Active Line/Drains/Airways    Name:   Placement date:   Placement time:   Site:   Days:   Peripheral IV 08/04/18 Left Antecubital   08/04/18    -    Antecubital   106   Peripheral IV 11/17/18 Left Antecubital   11/17/18    2002    Antecubital   1   Peripheral IV 11/17/18 Left Wrist   11/17/18    2003    Wrist   1   PICC Single Lumen 02/22/18 PICC Right Cephalic 40 cm 2 cm   42/87/68    1157    Cephalic   262   Urethral Catheter holly rn 14 Fr.   11/17/18    2109    -   1   Pressure Injury 02/17/18 Stage III -  Full thickness tissue loss. Subcutaneous fat may be visible but bone, tendon or muscle are NOT exposed.  02/17/18    2300     274   Pressure Injury 02/17/18 Stage II -  Partial thickness loss of dermis presenting as a shallow open ulcer with a red, pink wound bed without slough.   02/17/18    2300     274          Intake/Output Last 24 hours No intake or output data in the 24 hours ending 11/18/18 1453  Labs/Imaging Results for orders placed or performed during the hospital encounter of 11/17/18 (from the past 48 hour(s))  CBC with Differential     Status: Abnormal   Collection Time: 11/17/18  8:04 PM  Result Value Ref Range   WBC 18.0 (H) 4.0 - 10.5 K/uL    RBC 4.53 3.87 - 5.11 MIL/uL   Hemoglobin 13.3 12.0 - 15.0 g/dL   HCT 42.5 36.0 - 46.0 %   MCV 93.8 80.0 - 100.0 fL   MCH 29.4 26.0 - 34.0 pg   MCHC 31.3 30.0 - 36.0 g/dL   RDW 19.6 (H) 11.5 - 15.5 %   Platelets 369 150 - 400 K/uL   nRBC 0.2 0.0 - 0.2 %   Neutrophils Relative % 79 %   Neutro Abs 14.1 (H) 1.7 - 7.7 K/uL   Lymphocytes Relative 15 %   Lymphs Abs 2.7 0.7 - 4.0 K/uL   Monocytes Relative 5 %   Monocytes Absolute 0.9 0.1 - 1.0 K/uL   Eosinophils Relative 0 %   Eosinophils Absolute 0.1 0.0 - 0.5 K/uL   Basophils Relative 0 %   Basophils Absolute 0.1 0.0 - 0.1 K/uL   Immature Granulocytes 1 %   Abs Immature Granulocytes 0.24 (H) 0.00 - 0.07 K/uL    Comment: Performed at [facility], [place] [address], [place], [place] [address]  Comprehensive metabolic panel     Status: Abnormal   Collection Time: 11/17/18  8:04 PM  Result Value Ref Range   Sodium 143 135 - 145 mmol/L   Potassium 4.3 3.5 - 5.1 mmol/L   Chloride 107 98 - 111 mmol/L   CO2 23 22 - 32 mmol/L   Glucose, Bld 118 (H) 70 - 99 mg/dL   BUN 70 (H) 8 - 23 mg/dL   Creatinine, Ser 1.13 (H) 0.44 - 1.00 mg/dL   Calcium 9.0 8.9 - 10.3 mg/dL   Total Protein 6.3 (L) 6.5 - 8.1 g/dL   Albumin 2.5 (L) 3.5 - 5.0 g/dL   AST 25 15 - 41 U/L   ALT 11 0 - 44 U/L   Alkaline Phosphatase 140 (H) 38 - 126 U/L   Total Bilirubin 0.8 0.3 - 1.2 mg/dL   GFR calc non Af Amer 46 (L) >60 mL/min   GFR calc Af Amer 53 (L) >60 mL/min   Anion gap 13 5 - 15    Comment: Performed at [facility], [place] [address], [place], [place] [address]  Lipase, blood     Status: None   Collection Time: 11/17/18  8:04 PM  Result Value Ref Range   Lipase 17 11 - 51 U/L    Comment: Performed at [facility] [place] [address] [place], [place] [address]  Urinalysis, Routine w reflex microscopic     Status: Abnormal   Collection Time: 11/17/18  8:04 PM  Result Value Ref Range   Color, Urine YELLOW YELLOW    APPearance CLOUDY (A) CLEAR   Specific Gravity, Urine 1.016 1.005 - 1.030   pH  5.0 5.0 - 8.0   Glucose, UA NEGATIVE NEGATIVE mg/dL   Hgb urine dipstick LARGE (A) NEGATIVE   Bilirubin Urine NEGATIVE NEGATIVE   Ketones, ur NEGATIVE NEGATIVE mg/dL   Protein, ur NEGATIVE NEGATIVE mg/dL   Nitrite NEGATIVE NEGATIVE   Leukocytes,Ua LARGE (A) NEGATIVE   RBC / HPF >50 (H) 0 - 5 RBC/hpf   WBC, UA >50 (H) 0 - 5 WBC/hpf   Bacteria, UA MANY (A) NONE SEEN   Squamous Epithelial / LPF 0-5 0 - 5   WBC Clumps PRESENT    Amorphous Crystal PRESENT     Comment: Performed at [facility], [place] [address], [place], [place] [address]  Lactic acid, plasma     Status: Abnormal   Collection Time: 11/17/18  8:04 PM  Result Value Ref Range   Lactic Acid, Venous 2.9 (HH) 0.5 - 1.9 mmol/L    Comment: CRITICAL RESULT CALLED TO, READ BACK BY AND VERIFIED WITH: INMAN,J @ 2131 ON 101320 BY POTEAT,S Performed at [place] [address], [place], [place] [address]   APTT     Status: None   Collection Time: 11/17/18  8:34 PM  Result Value Ref Range   aPTT 32 24 - 36 seconds    Comment: Performed at [facility], [place] [address] [place], [place] [address]  Protime-INR     Status: None   Collection Time: 11/17/18  8:34 PM  Result Value Ref Range   Prothrombin Time 14.0 11.4 - 15.2 seconds   INR 1.1 0.8 - 1.2    Comment: (NOTE) INR goal varies based on device and disease states. Performed at [facility], [place] [address] [place], [place] [address]   Lactic acid, plasma     Status: Abnormal   Collection Time: 11/17/18  9:30 PM  Result Value Ref Range   Lactic Acid, Venous 2.9 (HH) 0.5 - 1.9 mmol/L    Comment: CRITICAL VALUE NOTED.  VALUE IS CONSISTENT WITH PREVIOUSLY REPORTED AND CALLED VALUE. Performed at [facility], [place] [address] [place], [place] [address]   SARS CORONAVIRUS 2 (TAT 6-24 HRS)  Nasopharyngeal Nasopharyngeal Swab     Status: None   Collection Time: 11/17/18 11:29 PM   Specimen: Nasopharyngeal Swab  Result Value Ref Range   SARS Coronavirus 2 NEGATIVE NEGATIVE    Comment: (NOTE) SARS-CoV-2 target nucleic acids are NOT DETECTED. The SARS-CoV-2 RNA is generally detectable in upper and lower respiratory specimens during the acute phase of infection. Negative results do not preclude SARS-CoV-2 infection, do not rule out co-infections with other pathogens, and should not be used as the sole basis for treatment or other patient management decisions. Negative results must be combined with clinical observations, patient history, and epidemiological information. The expected result is Negative. Fact Sheet for Patients: SugarRoll.be Fact Sheet for Healthcare Providers: https://www.woods-mathews.com/ This test is not yet approved or cleared by the [place] FDA and  has been authorized for detection and/or diagnosis of SARS-CoV-2 by FDA under an Emergency Use Authorization (EUA). This EUA will remain  in effect (meaning this test can be used) for the duration of the COVID-19 declaration under Section 56 4(b)(1) of the Act, 21 U.S.C. section 360bbb-3(b)(1), unless the authorization is terminated or revoked sooner. Performed at [facility], [place] [address] [place], [place] [address]   Lactic acid, plasma     Status: Abnormal   Collection Time: 11/18/18  1:33 AM  Result Value  Ref Range   Lactic Acid, Venous 2.6 (HH) 0.5 - 1.9 mmol/L    Comment: CRITICAL VALUE NOTED.  VALUE IS CONSISTENT WITH PREVIOUSLY REPORTED AND CALLED VALUE. Performed at [facility], [place] [address], [place], [place] [address]   Basic metabolic panel     Status: Abnormal   Collection Time: 11/18/18  3:54 AM  Result Value Ref Range   Sodium 144 135 - 145 mmol/L   Potassium 3.4 (L) 3.5 - 5.1 mmol/L    Comment: DELTA CHECK  NOTED REPEATED TO VERIFY    Chloride 114 (H) 98 - 111 mmol/L   CO2 20 (L) 22 - 32 mmol/L   Glucose, Bld 95 70 - 99 mg/dL   BUN 61 (H) 8 - 23 mg/dL   Creatinine, Ser 0.92 0.44 - 1.00 mg/dL   Calcium 7.3 (L) 8.9 - 10.3 mg/dL   GFR calc non Af Amer 58 (L) >60 mL/min   GFR calc Af Amer >60 >60 mL/min   Anion gap 10 5 - 15    Comment: Performed at [facility], [place] [address], [place], [place] [address]  CBC     Status: Abnormal   Collection Time: 11/18/18  3:54 AM  Result Value Ref Range   WBC 16.1 (H) 4.0 - 10.5 K/uL   RBC 3.80 (L) 3.87 - 5.11 MIL/uL   Hemoglobin 10.9 (L) 12.0 - 15.0 g/dL   HCT 35.7 (L) 36.0 - 46.0 %   MCV 93.9 80.0 - 100.0 fL   MCH 28.7 26.0 - 34.0 pg   MCHC 30.5 30.0 - 36.0 g/dL   RDW 19.3 (H) 11.5 - 15.5 %   Platelets 314 150 - 400 K/uL   nRBC 0.1 0.0 - 0.2 %    Comment: Performed at [facility], [place] [address], [place], [place] [address]  CK     Status: Abnormal   Collection Time: 11/18/18  3:54 AM  Result Value Ref Range   Total CK 18 (L) 38 - 234 U/L    Comment: Performed at [facility], [place] [address], [place], [place] [address]  Lactic acid, plasma     Status: Abnormal   Collection Time: 11/18/18  4:05 AM  Result Value Ref Range   Lactic Acid, Venous 2.4 (HH) 0.5 - 1.9 mmol/L    Comment: CRITICAL VALUE NOTED.  VALUE IS CONSISTENT WITH PREVIOUSLY REPORTED AND CALLED VALUE. Performed at [facility], [place] [address], [place], [place] [address]    Dg Chest 1 View  Result Date: 11/17/2018 CLINICAL DATA:  Decreased urine output, history of metastatic carcinoma to lung EXAM: CHEST  1 VIEW COMPARISON:  08/04/2018 FINDINGS: Innumerable bilateral pulmonary nodules consistent with metastatic disease, progressed since 08/04/2018 comparison. No pleural effusion. Normal heart size. Tortuous arch with atherosclerosis. Possible right hilar node. No pneumothorax. Chronic deformity of the  right humerus. Post mastectomy changes on the right. IMPRESSION: Innumerable pulmonary nodules consistent with metastatic disease, progressed as compared with prior radiograph from June 2020. Possible right hilar adenopathy. Electronically Signed   By: Donavan Foil M.D.   On: 11/17/2018 20:19   Dg Knee 2 Views Left  Result Date: 11/17/2018 CLINICAL DATA:  Left thigh pain EXAM: LEFT KNEE - 1-2 VIEW COMPARISON:  None FINDINGS: Bones appear osteopenic. No gross fracture or malalignment. Advanced tricompartment arthritis of the knee with probable trace effusion. Diffuse soft tissue swelling. Vascular calcifications. IMPRESSION: Osteopenia. Advanced tricompartment arthritis without gross evidence for acute fracture  Electronically Signed   By: Donavan Foil M.D.   On: 11/17/2018 20:14   Dg Abd 1 View  Result Date: 11/17/2018 CLINICAL DATA:  Left greater than right hip pain EXAM: ABDOMEN - 1 VIEW COMPARISON:  10/18/2017 FINDINGS: Patient is rotated. Surgical clips in the right upper quadrant. Nonobstructed bowel-gas pattern with impacted feces at the rectum. Chronic fracture deformity of the proximal left femur. IMPRESSION: Nonobstructed bowel-gas pattern with impacted feces at the rectum Electronically Signed   By: Donavan Foil M.D.   On: 11/17/2018 20:12   Dg Hip Unilat With Pelvis 2-3 Views Left  Result Date: 11/17/2018 CLINICAL DATA:  Left greater than right hip pain EXAM: DG HIP (WITH OR WITHOUT PELVIS) 2-3V LEFT COMPARISON:  10/18/2017 FINDINGS: Limited by patient positioning. Pubic bones are obscured by rectal stool. Intramedullary rod within the right femur with chronic fracture deformity. Chronic fracture deformity at left femoral neck with progressed bony resorptive changes at the femoral neck and trochanter. Superior and lateral displacement of the residual distal femur with respect to the hip joint. Vascular calcifications. Smooth cortex thickening or periosteal reaction without change.  IMPRESSION: 1. Chronic fracture deformity of the left femoral neck with progressed bony resorptive changes at the femoral neck and trochanter, now with superior and lateral displacement of the residual femoral shaft with respect to the hip joint. 2. Surgical hardware right hip with chronic fracture deformity Electronically Signed   By: Donavan Foil M.D.   On: 11/17/2018 20:16    Pending Labs Unresulted Labs (From admission, onward)    Start     Ordered   11/17/18 1852  Culture, blood (routine x 2)  BLOOD CULTURE X 2,   STAT     11/17/18 1853   11/17/18 1852  Urine culture  ONCE - STAT,   STAT     11/17/18 1853          Vitals/Pain Today's Vitals   11/18/18 1050 11/18/18 1100 11/18/18 1200 11/18/18 1300  BP: 134/84 103/80 104/78 109/74  Pulse: 98 (!) 102 84 81  Resp: 17 17 20 18   Temp:      TempSrc:      SpO2: 97% 99% 99% 97%  Weight:      Height:        Isolation Precautions No active isolations  Medications Medications  enoxaparin (LOVENOX) injection 40 mg (40 mg Subcutaneous Given 11/18/18 1124)  0.9 %  sodium chloride infusion ( Intravenous New Bag/Given 11/18/18 0125)  meropenem (MERREM) 1 g in sodium chloride 0.9 % 100 mL IVPB (1 g Intravenous New Bag/Given 11/18/18 1423)  HYDROcodone-acetaminophen (NORCO/VICODIN) 5-325 MG per tablet 1 tablet (1 tablet Oral Given 11/18/18 1048)  escitalopram (LEXAPRO) tablet 5 mg (5 mg Oral Given 11/18/18 1102)  senna-docusate (Senokot-S) tablet 2 tablet (has no administration in time range)  levETIRAcetam (KEPPRA) tablet 500 mg (500 mg Oral Given 11/18/18 1049)  potassium chloride SA (KLOR-CON) CR tablet 20 mEq (20 mEq Oral Given 11/18/18 1049)  polyethylene glycol (MIRALAX / GLYCOLAX) packet 17 g (17 g Oral Given 11/18/18 1048)  0.9 %  sodium chloride infusion ( Intravenous Stopped 11/18/18 0124)  ceFEPIme (MAXIPIME) 2 g in sodium chloride 0.9 % 100 mL IVPB (0 g Intravenous Stopped 11/17/18 2125)  metroNIDAZOLE (FLAGYL) IVPB 500 mg  (0 mg Intravenous Stopped 11/17/18 2338)  vancomycin (VANCOCIN) IVPB 1000 mg/200 mL premix (0 mg Intravenous Stopped 11/17/18 2338)  bisacodyl (DULCOLAX) suppository 10 mg (10 mg Rectal Given 11/18/18 0124)    Mobility  non-ambulatory High fall risk   Focused Assessments    R Recommendations: See Admitting Provider Note  Report given to:   Additional Notes:

## 2018-11-18 NOTE — Progress Notes (Addendum)
PROGRESS NOTE                                                                                                                                                                                                             Patient Demographics:    Autumn Bryant, is a 83 y.o. female, DOB - 02/01/1936, JFH:[id number]  Admit date - 11/17/2018   Admitting Physician No admitting provider for patient encounter.  Outpatient Primary MD for the patient is [name], MD  LOS - 0  Outpatient Specialists: none  Chief Complaint  Patient presents with  . Abnormal Lab    elevated white count        Brief Narrative  83 year old female, resident of SNF with advanced dementia, failure to thrive history of traumatic brain injury with seizures, metastatic lung cancer (of unknown primary), ESBL UTI with chronic indwelling Foley was sent from SNF with poor p.o. intake and low urine output. As per SNF patient has had very poor p.o. intake for past few days and no urine output from the Foley.  Lab work showed elevated BUN (59) and creatinine of 1.06.  She had elevated WBC of 15 K and sodium of 143.  Patient sent to the ED where she was found to be mildly tachycardic, elevated lactic acid of 2.9, WBC of 18 and AKI with creatinine 1.13. UA showed large leukocyte and many bacteria.  Abdominal x-ray showed fecal impaction.  Also showed multiple bilateral pulmonary nodules consistent with metastatic disease which had progressed since her last imaging in June.  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  Foley change in the ED, started to drain urine and culture sent.  Placed on empiric antibiotic and admitted to medical floor for sepsis secondary to UTI.   Subjective:    non verbal and non communicative. Awake  and moans to painful stimuli   Assessment  & Plan :   Principal problem Sepsis secondary to complicated UTI  Prior history of ESBL with chronic indwelling Foley catheter.  Foley change in the ED.  UA surgery of UTI, culture sent.  Antibiotic switched to IV meropenem. Continue IV fluids and monitor lactate.  Acute kidney injury ([place])  Combination of prerenal azotemia due to poor p.o. intake and UTI with sepsis. Monitor with IV fluids.  Constipation with fecal impaction Continue senna/docusate, MiraLAX daily.  Added bisacodyl suppository.  Sacral wound Wound care consulted.  Having difficulty to reposition the patient.  Essential hypertension Blood pressure soft.  Holding home meds.  Hypokalemia Replenished  History of seizures Continue Keppra  Advanced dementia and failure to thrive. Nutrition consult.  Metastatic cancer to the lung Unknown primary.  Does not appear any work-up has been done.  Will discuss with patient's son regarding goals of care and CODE STATUS.    Code Status : Full code  Family Communication  : called son twice and left a message  Disposition Plan  : Return to SNF possibly in the next 24-48 hours once stable  Barriers For Discharge : Active symptoms  Consults  : None  Procedures  : None  DVT Prophylaxis  :  Lovenox -   Lab Results  Component Value Date   PLT 314 11/18/2018    Antibiotics  :    Anti-infectives (From admission, onward)   Start     Dose/Rate Route Frequency Ordered Stop   11/18/18 0100  meropenem (MERREM) 1 g in sodium chloride 0.9 % 100 mL IVPB     1 g 200 mL/hr over 30 Minutes Intravenous Every 12 hours 11/18/18 0039     11/17/18 2045  ceFEPIme (MAXIPIME) 2 g in sodium chloride 0.9 % 100 mL IVPB     2 g 200 mL/hr over 30 Minutes Intravenous  Once 11/17/18 2034 11/17/18 2125   11/17/18 2045  metroNIDAZOLE (FLAGYL) IVPB 500 mg     500 mg 100 mL/hr over 60 Minutes Intravenous  Once 11/17/18 2034 11/17/18 2338    11/17/18 2045  vancomycin (VANCOCIN) IVPB 1000 mg/200 mL premix     1,000 mg 200 mL/hr over 60 Minutes Intravenous  Once 11/17/18 2034 11/17/18 2338        Objective:   Vitals:   11/18/18 0100 11/18/18 0300 11/18/18 0500 11/18/18 0749  BP: 113/88 (!) 125/92 112/86 108/79  Pulse: (!) 103   89  Resp: (!) 29 20 19 17   Temp:      TempSrc:      SpO2: 98%   94%  Weight:      Height:        Wt Readings from Last 3 Encounters:  11/17/18 54.6 kg  11/17/18 54.6 kg  11/11/18 51.6 kg    No intake or output data in the 24 hours ending 11/18/18 0912   Physical Exam  Gen: not in distress, non verbal HEENT:  moist mucosa, supple neck Chest: clear b/l, no added sounds CVS: N S1&S2, no murmurs, rubs or gallop GI: soft, NT, ND, BS+, foley+ draining clear urine Musculoskeletal: warm, no edema CNS: AAOX0., Moves extremities    Data Review:    CBC Recent Labs  Lab 11/17/18 2004 11/18/18 0354  WBC 18.0* 16.1*  HGB 13.3 10.9*  HCT 42.5 35.7*  PLT 369 314  MCV 93.8 93.9  MCH 29.4 28.7  MCHC 31.3 30.5  RDW 19.6* 19.3*  LYMPHSABS 2.7  --   MONOABS 0.9  --   EOSABS 0.1  --   BASOSABS 0.1  --     Chemistries  Recent Labs  Lab 11/17/18 2004 11/18/18 0354  NA 143 144  K 4.3 3.4*  CL 107 114*  CO2 23 20*  GLUCOSE 118* 95  BUN 70* 61*  CREATININE 1.13* 0.92  CALCIUM 9.0 7.3*  AST 25  --   ALT 11  --   ALKPHOS 140*  --   BILITOT 0.8  --    ------------------------------------------------------------------------------------------------------------------ No results for input(s): CHOL, HDL, LDLCALC, TRIG, CHOLHDL, LDLDIRECT in the last 72 hours.  No results found for: HGBA1C ------------------------------------------------------------------------------------------------------------------ No results for input(s): TSH, T4TOTAL, T3FREE, THYROIDAB in the last 72 hours.  Invalid input(s): FREET3  ------------------------------------------------------------------------------------------------------------------ No results for input(s): VITAMINB12, FOLATE, FERRITIN, TIBC, IRON, RETICCTPCT in the last 72 hours.  Coagulation profile Recent Labs  Lab 11/17/18 2034  INR 1.1    No results for input(s): DDIMER in the last 72 hours.  Cardiac Enzymes No results for input(s): CKMB, TROPONINI, MYOGLOBIN in the last 168 hours.  Invalid input(s): CK ------------------------------------------------------------------------------------------------------------------ No results found for: BNP  Inpatient Medications  Scheduled Meds: . enoxaparin (LOVENOX) injection  40 mg Subcutaneous Q24H  . escitalopram  5 mg Oral Daily  . HYDROcodone-acetaminophen  1 tablet Oral Q12H  . levETIRAcetam  500 mg Oral BID  . polyethylene glycol  17 g Oral Daily  . potassium chloride SA  20 mEq Oral Daily   Continuous Infusions: . sodium chloride 75 mL/hr at 11/18/18 0125  . meropenem (MERREM) IV Stopped (11/18/18 0707)   PRN Meds:.senna-docusate  Micro Results Recent Results (from the past 240 hour(s))  SARS CORONAVIRUS 2 (TAT 6-24 HRS) Nasopharyngeal Nasopharyngeal Swab     Status: None   Collection Time: 11/17/18 11:29 PM   Specimen: Nasopharyngeal Swab  Result Value Ref Range Status   SARS Coronavirus 2 NEGATIVE NEGATIVE Final    Comment: (NOTE) SARS-CoV-2 target nucleic acids are NOT DETECTED. The SARS-CoV-2 RNA is generally detectable in upper and lower respiratory specimens during the acute phase of infection. Negative results do not preclude SARS-CoV-2 infection, do not rule out co-infections with other pathogens, and should not be used as the sole basis for treatment or other patient management decisions. Negative results must be combined with clinical observations, patient history, and epidemiological information. The expected result is Negative. Fact Sheet for Patients:  SugarRoll.be Fact Sheet for Healthcare Providers: https://www.woods-mathews.com/ This test is not yet approved or cleared by the [place] FDA and  has been authorized for detection and/or diagnosis of SARS-CoV-2 by FDA under an Emergency Use Authorization (EUA). This EUA will remain  in effect (meaning this test can be used) for the duration of the COVID-19 declaration under Section 56 4(b)(1) of the Act, 21 U.S.C. section 360bbb-3(b)(1), unless the authorization is terminated or revoked sooner. Performed at [facility], [place] [address], [place], [place] [address]     Radiology Reports Dg Chest 1 View  Result Date: 11/17/2018 CLINICAL DATA:  Decreased urine output, history of metastatic carcinoma to lung EXAM: CHEST  1 VIEW COMPARISON:  08/04/2018 FINDINGS: Innumerable bilateral pulmonary nodules consistent with metastatic disease, progressed since 08/04/2018 comparison. No pleural effusion. Normal heart size. Tortuous arch with atherosclerosis. Possible right hilar node. No pneumothorax. Chronic deformity of the right humerus. Post mastectomy changes on the right. IMPRESSION: Innumerable pulmonary nodules consistent with metastatic disease, progressed as compared with prior radiograph from June 2020. Possible right hilar adenopathy. Electronically Signed   By: Donavan Foil M.D.   On: 11/17/2018 20:19   Dg Knee 2 Views Left  Result Date: 11/17/2018 CLINICAL DATA:  Left thigh pain EXAM: LEFT KNEE - 1-2 VIEW COMPARISON:  None FINDINGS: Bones appear osteopenic. No gross fracture or malalignment. Advanced tricompartment arthritis of the knee with probable trace effusion.  Diffuse soft tissue swelling. Vascular calcifications. IMPRESSION: Osteopenia. Advanced tricompartment arthritis without gross evidence for acute fracture Electronically Signed   By: Donavan Foil M.D.   On: 11/17/2018 20:14   Dg Abd 1 View  Result Date: 11/17/2018  CLINICAL DATA:  Left greater than right hip pain EXAM: ABDOMEN - 1 VIEW COMPARISON:  10/18/2017 FINDINGS: Patient is rotated. Surgical clips in the right upper quadrant. Nonobstructed bowel-gas pattern with impacted feces at the rectum. Chronic fracture deformity of the proximal left femur. IMPRESSION: Nonobstructed bowel-gas pattern with impacted feces at the rectum Electronically Signed   By: Donavan Foil M.D.   On: 11/17/2018 20:12   Dg Hip Unilat With Pelvis 2-3 Views Left  Result Date: 11/17/2018 CLINICAL DATA:  Left greater than right hip pain EXAM: DG HIP (WITH OR WITHOUT PELVIS) 2-3V LEFT COMPARISON:  10/18/2017 FINDINGS: Limited by patient positioning. Pubic bones are obscured by rectal stool. Intramedullary rod within the right femur with chronic fracture deformity. Chronic fracture deformity at left femoral neck with progressed bony resorptive changes at the femoral neck and trochanter. Superior and lateral displacement of the residual distal femur with respect to the hip joint. Vascular calcifications. Smooth cortex thickening or periosteal reaction without change. IMPRESSION: 1. Chronic fracture deformity of the left femoral neck with progressed bony resorptive changes at the femoral neck and trochanter, now with superior and lateral displacement of the residual femoral shaft with respect to the hip joint. 2. Surgical hardware right hip with chronic fracture deformity Electronically Signed   By: Donavan Foil M.D.   On: 11/17/2018 20:16    Time Spent in minutes  25   Keiry Kowal M.D on 11/18/2018 at 9:12 AM  Between 7am to 7pm - Pager - [phone]  After 7pm go to [email or web address] - password [facility]  [facility] -  Office  [phone]

## 2018-11-18 NOTE — Progress Notes (Signed)
Pharmacy Antibiotic Note  Autumn Bryant is a 83 y.o. female admitted on 11/17/2018 with UTI.  Patient has indwelling foley catheter noted to have decreased urinary output and failure to thrive at nursing facility.   Hx ESBl EColi.  Pharmacy has been consulted for Meropenem dosing.   11/18/2018:  Afebrile  WBC 18, LA 2.9  Scr 1.13 (baseline 0.5)  Plan: Meropenem 1gm IV q12h Monitor renal function and cx data   Height: 5\' 5"  (165.1 cm) Weight: 120 lb 6.4 oz (54.6 kg) IBW/kg (Calculated) : 57  Temp (24hrs), Avg:98.1 F (36.7 C), Min:96.9 F (36.1 C), Max:99.3 F (37.4 C)  Recent Labs  Lab 11/17/18 2004 11/17/18 2130  WBC 18.0*  --   CREATININE 1.13*  --   LATICACIDVEN 2.9* 2.9*    Estimated Creatinine Clearance: 33.7 mL/min (A) (by C-G formula based on SCr of 1.13 mg/dL (H)).    Allergies  Allergen Reactions  . Demerol [Meperidine] Swelling    Antimicrobials this admission: 10/14  Meropenem >>  10/13 Vanc/Cefepime/Flagyl x1 in ED   Dose adjustments this admission:  Microbiology results: 10/13 BCx:  10/13 UCx:   10/13 COVID:  Thank you for allowing pharmacy to be a part of this patient's care.  Netta Cedars, PharmD, BCPS 11/18/2018 12:33 AM

## 2018-11-19 ENCOUNTER — Other Ambulatory Visit: Payer: Self-pay

## 2018-11-19 DIAGNOSIS — E870 Hyperosmolality and hypernatremia: Secondary | ICD-10-CM

## 2018-11-19 LAB — CBC
HCT: 40.1 % (ref 36.0–46.0)
Hemoglobin: 11.8 g/dL — ABNORMAL LOW (ref 12.0–15.0)
MCH: 28.4 pg (ref 26.0–34.0)
MCHC: 29.4 g/dL — ABNORMAL LOW (ref 30.0–36.0)
MCV: 96.4 fL (ref 80.0–100.0)
Platelets: 339 10*3/uL (ref 150–400)
RBC: 4.16 MIL/uL (ref 3.87–5.11)
RDW: 19.8 % — ABNORMAL HIGH (ref 11.5–15.5)
WBC: 17.2 10*3/uL — ABNORMAL HIGH (ref 4.0–10.5)
nRBC: 0.2 % (ref 0.0–0.2)

## 2018-11-19 LAB — BASIC METABOLIC PANEL
Anion gap: 15 (ref 5–15)
BUN: 59 mg/dL — ABNORMAL HIGH (ref 8–23)
CO2: 19 mmol/L — ABNORMAL LOW (ref 22–32)
Calcium: 8.4 mg/dL — ABNORMAL LOW (ref 8.9–10.3)
Chloride: 113 mmol/L — ABNORMAL HIGH (ref 98–111)
Creatinine, Ser: 0.99 mg/dL (ref 0.44–1.00)
GFR calc Af Amer: >60 mL/min (ref >60)
GFR calc non Af Amer: 53 mL/min — ABNORMAL LOW (ref >60)
Glucose, Bld: 97 mg/dL (ref 70–99)
Potassium: 4 mmol/L (ref 3.5–5.1)
Sodium: 147 mmol/L — ABNORMAL HIGH (ref 135–145)

## 2018-11-19 MED ORDER — FREE WATER
250.0000 mL | Freq: Three times a day (TID) | Status: DC
  Administered 2018-11-20: 250 mL via ORAL

## 2018-11-19 MED ORDER — FREE WATER: 250.0000 mL | Freq: Three times a day (TID) | Status: DC

## 2018-11-19 MED ORDER — COLLAGENASE 250 UNIT/GM EX OINT
TOPICAL_OINTMENT | Freq: Every day | CUTANEOUS | Status: DC
  Administered 2018-11-19 – 2018-11-22 (×4): via TOPICAL
  Filled 2018-11-19: qty 30

## 2018-11-19 MED ORDER — ADULT MULTIVITAMIN LIQUID CH
15.0000 mL | Freq: Every day | ORAL | Status: DC
  Administered 2018-11-22: 15 mL via ORAL
  Filled 2018-11-19 (×4): qty 15

## 2018-11-19 MED ORDER — ENSURE ENLIVE PO LIQD
237.0000 mL | Freq: Three times a day (TID) | ORAL | Status: DC
  Administered 2018-11-20: 237 mL via ORAL

## 2018-11-19 MED ORDER — PRO-STAT SUGAR FREE PO LIQD
30.0000 mL | Freq: Two times a day (BID) | ORAL | Status: DC
  Administered 2018-11-20 – 2018-11-22 (×2): 30 mL via ORAL
  Filled 2018-11-19 (×3): qty 30

## 2018-11-19 NOTE — Progress Notes (Signed)
PROGRESS NOTE                                                                                                                                                                                                             Patient Demographics:    Autumn Bryant, is a 83 y.o. female, DOB - 03-Nov-1935, YFV:[id number]  Admit date - 11/17/2018   Admitting Physician Orene Desanctis, DO  Outpatient Primary MD for the patient is [name], MD  LOS - 1  Outpatient Specialists: none  Chief Complaint  Patient presents with  . Abnormal Lab    elevated white count        Brief Narrative  83 year old female, resident of SNF with advanced dementia, failure to thrive history of traumatic brain injury with seizures, metastatic lung cancer (of unknown primary), ESBL UTI with chronic indwelling Foley was sent from SNF with poor p.o. intake and low urine output. As per SNF patient has had very poor p.o. intake for past few days and no urine output from the Foley.  Lab work showed elevated BUN (59) and creatinine of 1.06.  She had elevated WBC of 15 K and sodium of 143.  Patient sent to the ED where she was found to be mildly tachycardic, elevated lactic acid of 2.9, WBC of 18 and AKI with creatinine 1.13. UA showed large leukocyte and many bacteria.  Abdominal x-ray showed fecal impaction.  Also showed multiple bilateral pulmonary nodules consistent with metastatic disease which had progressed since her last imaging in June.  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  Foley change in the ED, started to drain urine and culture sent.  Placed on empiric antibiotic and admitted to medical floor for sepsis secondary to UTI.   Subjective:   Noncommunicative and yells out during interaction.  Refusing p.o. meds.    Assessment  & Plan :   Principal problem Sepsis secondary to complicated UTI  Prior history of ESBL with chronic indwelling Foley catheter.  Foley change in the ED. urine culture sent.  On empiric IV meropenem.  Follow sensitivity Continue IV fluids.  Acute kidney injury ([place]) Combination of prerenal azotemia due to poor p.o. intake  and UTI with sepsis. Resolved with fluids.  Hypernatremia Secondary to dehydration.  Add free water.  Constipation with fecal impaction Continue senna/docusate, MiraLAX daily.  Added bisacodyl suppository.  Sacral wound Wound care consulted.  Having difficulty to reposition the patient.  Essential hypertension Blood pressure soft.  Holding home meds.  Hypokalemia Replenished  History of seizures Continue Keppra  Advanced dementia and failure to thrive. Nutrition consult.  Metastatic cancer to the lung Unknown primary.  Does not appear any work-up has been done.  Will discuss with patient's son regarding goals of care and CODE STATUS.  Goals of care. Have been attempting to calling her son multiple times and goes straight to his voicemail.  Left a message.  Patient has guarded prognosis overall and need to establish goals of care.   Code Status : Full code  Family Communication  : Unable to reach son on the phone  Disposition Plan  : Return to SNF possibly in the next 24- hours if stable and final culture and sensitivity available  Barriers For Discharge : Active symptoms  Consults  : None  Procedures  : None  DVT Prophylaxis  :  Lovenox -   Lab Results  Component Value Date   PLT 339 11/19/2018    Antibiotics  :    Anti-infectives (From admission, onward)   Start     Dose/Rate Route Frequency Ordered Stop   11/18/18 0100  meropenem (MERREM) 1 g in sodium chloride 0.9 % 100 mL IVPB     1 g 200 mL/hr over 30 Minutes Intravenous Every 12 hours 11/18/18 0039     11/17/18 2045  ceFEPIme (MAXIPIME) 2 g in sodium chloride 0.9  % 100 mL IVPB     2 g 200 mL/hr over 30 Minutes Intravenous  Once 11/17/18 2034 11/17/18 2125   11/17/18 2045  metroNIDAZOLE (FLAGYL) IVPB 500 mg     500 mg 100 mL/hr over 60 Minutes Intravenous  Once 11/17/18 2034 11/17/18 2338   11/17/18 2045  vancomycin (VANCOCIN) IVPB 1000 mg/200 mL premix     1,000 mg 200 mL/hr over 60 Minutes Intravenous  Once 11/17/18 2034 11/17/18 2338        Objective:   Vitals:   11/18/18 1639 11/18/18 2227 11/19/18 0007 11/19/18 0619  BP: (!) 128/57 130/86  120/86  Pulse: 97 (!) 102 90 99  Resp: 16 20  16   Temp:  97.6 F (36.4 C)  98 F (36.7 C)  TempSrc:  Oral  Oral  SpO2:  97%  96%  Weight:      Height:        Wt Readings from Last 3 Encounters:  11/17/18 54.6 kg  11/17/18 54.6 kg  11/11/18 51.6 kg     Intake/Output Summary (Last 24 hours) at 11/19/2018 1205 Last data filed at 11/19/2018 0800 Gross per 24 hour  Intake 1849.77 ml  Output 925 ml  Net 924.77 ml    Physical exam Nonverbal, not in distress HEENT: Moist mucosa, supple neck Chest: Clear CVs: Normal S1-S2 GI: Soft, nondistended nontender, bowel sounds present, Foley draining clear urine Musculoskeletal: Warm, no edema CNS: Nonverbal, not oriented    Data Review:    CBC Recent Labs  Lab 11/17/18 2004 11/18/18 0354 11/19/18 1002  WBC 18.0* 16.1* 17.2*  HGB 13.3 10.9* 11.8*  HCT 42.5 35.7* 40.1  PLT 369 314 339  MCV 93.8 93.9 96.4  MCH 29.4 28.7 28.4  MCHC 31.3 30.5 29.4*  RDW 19.6* 19.3* 19.8*  LYMPHSABS 2.7  --   --   MONOABS 0.9  --   --   EOSABS 0.1  --   --   BASOSABS 0.1  --   --     Chemistries  Recent Labs  Lab 11/17/18 2004 11/18/18 0354 11/19/18 1002  NA 143 144 147*  K 4.3 3.4* PENDING  CL 107 114* 113*  CO2 23 20* 19*  GLUCOSE 118* 95 97  BUN 70* 61* 59*  CREATININE 1.13* 0.92 0.99  CALCIUM 9.0 7.3* 8.4*  AST 25  --   --   ALT 11  --   --   ALKPHOS 140*  --   --   BILITOT 0.8  --   --     ------------------------------------------------------------------------------------------------------------------ No results for input(s): CHOL, HDL, LDLCALC, TRIG, CHOLHDL, LDLDIRECT in the last 72 hours.  No results found for: HGBA1C ------------------------------------------------------------------------------------------------------------------ No results for input(s): TSH, T4TOTAL, T3FREE, THYROIDAB in the last 72 hours.  Invalid input(s): FREET3 ------------------------------------------------------------------------------------------------------------------ No results for input(s): VITAMINB12, FOLATE, FERRITIN, TIBC, IRON, RETICCTPCT in the last 72 hours.  Coagulation profile Recent Labs  Lab 11/17/18 2034  INR 1.1    No results for input(s): DDIMER in the last 72 hours.  Cardiac Enzymes No results for input(s): CKMB, TROPONINI, MYOGLOBIN in the last 168 hours.  Invalid input(s): CK ------------------------------------------------------------------------------------------------------------------ No results found for: BNP  Inpatient Medications  Scheduled Meds: . Chlorhexidine Gluconate Cloth  6 each Topical Daily  . collagenase   Topical Daily  . enoxaparin (LOVENOX) injection  40 mg Subcutaneous Q24H  . escitalopram  5 mg Oral Daily  . feeding supplement (ENSURE ENLIVE)  237 mL Oral TID BM  . feeding supplement (PRO-STAT SUGAR FREE 64)  30 mL Oral BID  . multivitamin  15 mL Oral Daily  . polyethylene glycol  17 g Oral Daily  . potassium chloride SA  20 mEq Oral Daily   Continuous Infusions: . sodium chloride 75 mL/hr at 11/19/18 0156  . levETIRAcetam 500 mg (11/19/18 0904)  . meropenem (MERREM) IV 1 g (11/19/18 0151)   PRN Meds:.HYDROcodone-acetaminophen, morphine injection, senna-docusate  Micro Results Recent Results (from the past 240 hour(s))  Culture, blood (routine x 2)     Status: None (Preliminary result)   Collection Time: 11/17/18  8:04 PM    Specimen: BLOOD  Result Value Ref Range Status   Specimen Description   Final    BLOOD LEFT ANTECUBITAL Performed at [facility] [address], [place], [place] [address]    Special Requests   Final    BOTTLES DRAWN AEROBIC AND ANAEROBIC Blood Culture adequate volume Performed at [facility] [address] [place], [place] [address]    Culture   Final    NO GROWTH 2 DAYS Performed at [facility] [address], [place], [place] [address]    Report Status PENDING  Incomplete  Culture, blood (routine x 2)     Status: None (Preliminary result)   Collection Time: 11/17/18  8:04 PM   Specimen: BLOOD  Result Value Ref Range Status   Specimen Description   Final    BLOOD BLOOD LEFT WRIST Performed at [facility] [address], [place], [place] [address]    Special Requests   Final    BOTTLES DRAWN AEROBIC AND ANAEROBIC Blood Culture adequate volume Performed at [facility] [address] [place], [place] [address]    Culture   Final  NO GROWTH 2 DAYS Performed at [facility], [place] [address] [place], [place] [address]    Report Status PENDING  Incomplete  Urine culture     Status: Abnormal (Preliminary result)   Collection Time: 11/17/18  8:04 PM   Specimen: Urine, Random  Result Value Ref Range Status   Specimen Description   Final    URINE, RANDOM Performed at [facility] [address] [place], [place] [address]    Special Requests   Final    NONE Performed at [facility], [place] [address], [place], [place] [address]    Culture (A)  Final    >=100,000 COLONIES/mL GRAM NEGATIVE RODS IDENTIFICATION AND SUSCEPTIBILITIES TO FOLLOW Performed at [facility], [place] [address], [place], [place] [address]    Report Status PENDING  Incomplete  SARS CORONAVIRUS 2 (TAT 6-24 HRS) Nasopharyngeal Nasopharyngeal Swab     Status: None    Collection Time: 11/17/18 11:29 PM   Specimen: Nasopharyngeal Swab  Result Value Ref Range Status   SARS Coronavirus 2 NEGATIVE NEGATIVE Final    Comment: (NOTE) SARS-CoV-2 target nucleic acids are NOT DETECTED. The SARS-CoV-2 RNA is generally detectable in upper and lower respiratory specimens during the acute phase of infection. Negative results do not preclude SARS-CoV-2 infection, do not rule out co-infections with other pathogens, and should not be used as the sole basis for treatment or other patient management decisions. Negative results must be combined with clinical observations, patient history, and epidemiological information. The expected result is Negative. Fact Sheet for Patients: SugarRoll.be Fact Sheet for Healthcare Providers: https://www.woods-mathews.com/ This test is not yet approved or cleared by the [place] FDA and  has been authorized for detection and/or diagnosis of SARS-CoV-2 by FDA under an Emergency Use Authorization (EUA). This EUA will remain  in effect (meaning this test can be used) for the duration of the COVID-19 declaration under Section 56 4(b)(1) of the Act, 21 U.S.C. section 360bbb-3(b)(1), unless the authorization is terminated or revoked sooner. Performed at [facility], [place] [address], [place], [place] [address]     Radiology Reports Dg Chest 1 View  Result Date: 11/17/2018 CLINICAL DATA:  Decreased urine output, history of metastatic carcinoma to lung EXAM: CHEST  1 VIEW COMPARISON:  08/04/2018 FINDINGS: Innumerable bilateral pulmonary nodules consistent with metastatic disease, progressed since 08/04/2018 comparison. No pleural effusion. Normal heart size. Tortuous arch with atherosclerosis. Possible right hilar node. No pneumothorax. Chronic deformity of the right humerus. Post mastectomy changes on the right. IMPRESSION: Innumerable pulmonary nodules consistent with metastatic disease,  progressed as compared with prior radiograph from June 2020. Possible right hilar adenopathy. Electronically Signed   By: Donavan Foil M.D.   On: 11/17/2018 20:19   Dg Knee 2 Views Left  Result Date: 11/17/2018 CLINICAL DATA:  Left thigh pain EXAM: LEFT KNEE - 1-2 VIEW COMPARISON:  None FINDINGS: Bones appear osteopenic. No gross fracture or malalignment. Advanced tricompartment arthritis of the knee with probable trace effusion. Diffuse soft tissue swelling. Vascular calcifications. IMPRESSION: Osteopenia. Advanced tricompartment arthritis without gross evidence for acute fracture Electronically Signed   By: Donavan Foil M.D.   On: 11/17/2018 20:14   Dg Abd 1 View  Result Date: 11/17/2018 CLINICAL DATA:  Left greater than right hip pain EXAM: ABDOMEN - 1 VIEW COMPARISON:  10/18/2017 FINDINGS: Patient is rotated. Surgical clips in the right upper quadrant. Nonobstructed bowel-gas pattern with impacted feces at the rectum. Chronic fracture deformity  of the proximal left femur. IMPRESSION: Nonobstructed bowel-gas pattern with impacted feces at the rectum Electronically Signed   By: Donavan Foil M.D.   On: 11/17/2018 20:12   Dg Hip Unilat With Pelvis 2-3 Views Left  Result Date: 11/17/2018 CLINICAL DATA:  Left greater than right hip pain EXAM: DG HIP (WITH OR WITHOUT PELVIS) 2-3V LEFT COMPARISON:  10/18/2017 FINDINGS: Limited by patient positioning. Pubic bones are obscured by rectal stool. Intramedullary rod within the right femur with chronic fracture deformity. Chronic fracture deformity at left femoral neck with progressed bony resorptive changes at the femoral neck and trochanter. Superior and lateral displacement of the residual distal femur with respect to the hip joint. Vascular calcifications. Smooth cortex thickening or periosteal reaction without change. IMPRESSION: 1. Chronic fracture deformity of the left femoral neck with progressed bony resorptive changes at the femoral neck and  trochanter, now with superior and lateral displacement of the residual femoral shaft with respect to the hip joint. 2. Surgical hardware right hip with chronic fracture deformity Electronically Signed   By: Donavan Foil M.D.   On: 11/17/2018 20:16    Time Spent in minutes  25   Berdell Nevitt M.D on 11/19/2018 at 12:05 PM  Between 7am to 7pm - Pager - [phone]  After 7pm go to [email or web address] - password [facility]  [facility] -  Office  [phone]

## 2018-11-19 NOTE — TOC Initial Note (Signed)
Transition of Care [facility]) - Initial/Assessment Note    Patient Details  Name: Autumn Bryant MRN: [id number] Date of Birth: 31-Aug-1935  Transition of Care [facility]) CM/SW Contact:    Trish Mage, LCSW Phone Number: 11/19/2018, 2:28 PM  Clinical Narrative:    Ms Lape is a long-term [2 plus years] resident of [facility].  According to DON, she came to them in a coma and was not expected to live this long.  Despite being bed bound and having dementia, was doing much better up until about 4 weeks ago, when she became less communicative. Also, they have had challenges with communication with the son as he does not generally return calls, and has insisted that she continue to be full code and that "everything possible" be done for her despite attempts at education about quality of life and reality of her medical condition.  They are prepared to receive her back when she is medically stable. TOC will continue to follow during the course of hospitalization.           Expected Discharge Plan: Skilled Nursing Facility Barriers to Discharge: No Barriers Identified   Patient Goals and CMS Choice Patient states their goals for this hospitalization and ongoing recovery are:: Pt is non-verbal      Expected Discharge Plan and Services Expected Discharge Plan: [place]   Discharge Planning Services: CM Consult   Living arrangements for the past 2 months: [place]                                      Prior Living Arrangements/Services Living arrangements for the past 2 months: [place] Lives with:: Facility Resident Patient language and need for interpreter reviewed:: Yes Do you feel safe going back to the place where you live?: Yes      Need for Family Participation in Patient Care: Yes (Comment) Care giver support system in place?: Yes (comment)   Criminal Activity/Legal Involvement Pertinent to Current Situation/Hospitalization: No -  Comment as needed  Activities of Daily Living Home Assistive Devices/Equipment: Blood pressure cuff, Grab bars around toilet, Grab bars in shower, Hand-held shower hose, Hospital bed, [employer], Scales, Other (Comment), Wheelchair(foley catheter-[facility] has necessary equipment for their residents) ADL Screening (condition at time of admission) Patient's cognitive ability adequate to safely complete daily activities?: No Is the patient deaf or have difficulty hearing?: No Does the patient have difficulty seeing, even when wearing glasses/contacts?: No Does the patient have difficulty concentrating, remembering, or making decisions?: Yes Patient able to express need for assistance with ADLs?: No Does the patient have difficulty dressing or bathing?: Yes Independently performs ADLs?: No Communication: Independent Dressing (OT): Dependent Is this a change from baseline?: Pre-admission baseline Grooming: Dependent Is this a change from baseline?: Pre-admission baseline Feeding: Dependent Is this a change from baseline?: Pre-admission baseline Bathing: Dependent Is this a change from baseline?: Pre-admission baseline Toileting: Dependent Is this a change from baseline?: Pre-admission baseline In/Out Bed: Dependent Is this a change from baseline?: Pre-admission baseline Walks in Home: Dependent Is this a change from baseline?: Pre-admission baseline Does the patient have difficulty walking or climbing stairs?: Yes(secondary to weakness) Weakness of Legs: Both Weakness of Arms/Hands: Both  Permission Sought/Granted Permission sought to share information with : Family Supports Permission granted to share information with : No  Share Information with NAME: Symone Cornman  Permission granted to share info w Relationship: son  Permission granted to share info w Contact Information: [phone]  Emotional Assessment Appearance:: Appears stated age Attitude/Demeanor/Rapport: Unable  to Assess Affect (typically observed): Unable to Assess Orientation: : Oriented to Self Alcohol / Substance Use: Not Applicable Psych Involvement: No (comment)  Admission diagnosis:  Weakness [R53.1] Pain [R52] Bacteriuria [R82.71] Metastatic cancer to lung of unknown cell type (HCC) [C78.00] Closed displaced fracture of left femoral neck ([place]) [S72.002A] Patient Active Problem List   Diagnosis Date Noted  . UTI (urinary tract infection) 11/18/2018  . Grand mal seizure ([place]) 08/16/2018  . Elevated WBC count 08/08/2018  . Abnormal chest x-ray 08/08/2018  . Metastatic cancer to lung of unknown cell type ([place]) 08/08/2018  . Hypernatremia 05/26/2018  . HCAP (healthcare-associated pneumonia) 05/18/2018  . Acute renal failure (ARF) ([facility]) 05/07/2018  . MRSA (methicillin resistant Staphylococcus aureus) septicemia ([place]) 02/23/2018  . Hypokalemia 02/23/2018  . Bile duct stone   . E. coli sepsis ([place]) 02/17/2018  . Cholecystitis 07/31/2017  . Cholecystitis, acute with cholelithiasis 07/20/2017  . Pressure injury of skin 07/20/2017  . History of total right hip arthroplasty 01/05/2017  . Acute blood loss as cause of postoperative anemia 01/05/2017  . Escherichia coli urinary tract infection 01/05/2017  . Acute encephalopathy 01/05/2017  . Depression, recurrent ([place]) 01/05/2017  . Polyneuropathy 01/05/2017  . Closed right hip fracture ([place]) 12/25/2016  . Anxiety 11/03/2016  . Dysphagia 07/13/2016  . Anasarca 05/21/2016  . Obesity (BMI 30.0-34.9) 05/21/2016  . Closed TBI (traumatic brain injury) ([place]) 04/21/2016  . Status post insertion of percutaneous endoscopic gastrostomy (PEG) tube ([place]) 04/21/2016  . Facial laceration 04/21/2016  . Cheek wound, right, subsequent encounter 04/21/2016  . Urinary tract infection due to extended-spectrum beta lactamase (ESBL) producing Escherichia coli 04/21/2016  . Hypotension 04/21/2016  . Gout   . Hypertension    PCP:  [name],  MD Pharmacy:   [facility], [place] [address] Phone: [phone] Fax: [phone]     Social Determinants of Health (SDOH) Interventions    Readmission Risk Interventions No flowsheet data found.

## 2018-11-19 NOTE — Progress Notes (Signed)
  Speech Language Pathology Treatment: Dysphagia  Patient Details Name: Autumn Bryant MRN: [id number] DOB: [birth year] Today's Date: 11/19/2018 Time: 7619-5093 SLP Time Calculation (min) (ACUTE ONLY): 26 min  Assessment / Plan / Recommendation Clinical Impression  Today pt asleep but awoke to accept intake from this SLP after SLP provided gustatory stimulation.  She was able to produce suction on a straw and demonstrated minimal delay in swallow.  Pt at times required more intake or lingual/labial stimulation to elicit swallow but no indication of aspiration with all po. Pt did not open oral cavity to assess for residuals but SLP finger sweep revealed clear gumline.  Pt did not attempt to masticate sweet solid offered to her - rather solids were retained at anterior lip without manipulation attempts - SLP finally removed bolus.    At this time liquids are more efficent for pt to swallow, thus recommend full liquid diet with full supervision with hopes to advance as she medically improves.    HPI HPI: 83 yo female resident of SNF with h/o lung cancer and dementia admitted with FTT and decreased mental status - ? UTI with h/o ESBL-UTI .  Concern for possible UTI.  Pt has had prior admits for FTT and old CT 02/17/2018 showed atrophy, old right frontal encephalomalacia, CXR 02/16/2018 left base scarring.  Pt also has a lung malignant neoplasm, pressure ulcer Stage 3, seizure d/o.  She has fecal rectum impaction per Abdomen 11/17/2018.        SLP Plan  Continue with current plan of care       Recommendations  Diet recommendations: Thin liquid;Nectar-thick liquid Liquids provided via: Cup;Straw;Teaspoon Medication Administration: Crushed with puree Supervision: Full supervision/cueing for compensatory strategies;Staff to assist with self feeding Compensations: Slow rate;Small sips/bites(oral suction prn, assure clearance of solids and that pt elicited swallow) Postural Changes and/or Swallow  Maneuvers: Seated upright 90 degrees;Upright 30-60 min after meal                Oral Care Recommendations: Oral care QID SLP Visit Diagnosis: Dysphagia, oral phase (R13.11) Plan: Continue with current plan of care       [place], [place] [facility] SLP [place] Pager [phone] Office [phone]   Macario Golds 11/19/2018, 9:06 AM

## 2018-11-19 NOTE — Progress Notes (Signed)
Initial Nutrition Assessment  INTERVENTION:   -Ensure Enlive po TID, each supplement provides 350 kcal and 20 grams of protein -Magic cup with lunch, each supplement provides 290 kcal and 9 grams of protein -Prostat liquid protein PO 30 ml BID with meals, each supplement provides 100 kcal, 15 grams protein. -Liquid MVI daily  NUTRITION DIAGNOSIS:   Increased nutrient needs related to cancer and cancer related treatments, wound healing as evidenced by estimated needs.  GOAL:   Patient will meet greater than or equal to 90% of their needs  MONITOR:   PO intake, Supplement acceptance, Labs, Weight trends, I & O's, Skin  REASON FOR ASSESSMENT:   Consult Assessment of nutrition requirement/status  ASSESSMENT:   83 year old female, resident of SNF with advanced dementia, failure to thrive history of traumatic brain injury with seizures, metastatic lung cancer (of unknown primary), ESBL UTI with chronic indwelling Foley was sent from SNF with poor p.o. intake and low urine output.  **RD working remotely**  Patient nonverbal, unable to provide history. Per SLP note today, pt recommended full liquids given aspiration risk. Per facility, pt was on a mechanical soft diet but was not eating well for weeks PTA. Pt receives Ensure, Magic cups, Prostat and daily MVI at facility. Will order these for patient.   Per weight records,  Pt weighed 149 lbs in January 2020, has lost 29 lbs since then (19% wt loss x 9 months, significant for time frame).   I/Os: +874 ml UOP 10/14: 925 ml  Medications: KLOR-CON Labs reviewed: Low K  NUTRITION - FOCUSED PHYSICAL EXAM:  Unable to perform- working remotely.  Diet Order:   Diet Order            Diet full liquid Room service appropriate? Yes; Fluid consistency: Thin  Diet effective now              EDUCATION NEEDS:   No education needs have been identified at this time  Skin:  Skin Assessment: Reviewed RN Assessment  Last BM:   10/14  Height:   Ht Readings from Last 1 Encounters:  11/17/18 5\' 5"  (1.651 m)    Weight:   Wt Readings from Last 1 Encounters:  11/17/18 54.6 kg    Ideal Body Weight:  56.8 kg  BMI:  Body mass index is 20.04 kg/m.  Estimated Nutritional Needs:   Kcal:  1700-1900  Protein:  80-90g  Fluid:  1.9L/day  Clayton Bibles, MS, RD, LDN Inpatient Clinical Dietitian Pager: [phone] After Hours Pager: [phone]

## 2018-11-19 NOTE — Consult Note (Signed)
[facility] Nurse wound consult note Patient receiving care in [facility].  Consult completed remotely after review of record, including flowsheet entries for sacral wound.  No photos present under Media tab. Reason for Consult: sacral ulcer Wound type: description in flowsheet section consistent with stage 3 PI with slough. Pressure Injury POA: Yes Measurement: for all wound characteristics, see flowsheet section. Wound bed: Drainage (amount, consistency, odor)  Periwound: Dressing procedure/placement/frequency: Apply Santyl to in a nickel thick layer. Cover with a saline moistened gauze, then dry gauze or ABD pad.  Change daily. I have also added turning instructions for staff to turn right or left, and not place on the sacral area. Monitor the wound area(s) for worsening of condition such as: Signs/symptoms of infection,  Increase in size,  Development of or worsening of odor, Development of pain, or increased pain at the affected locations.  Notify the medical team if any of these develop.  Thank you for the consult.  [place] nurse will not follow at this time.  Please re-consult the [facility] team if needed.  Val Riles, RN, MSN, CWOCN, CNS-BC, pager [phone]

## 2018-11-20 ENCOUNTER — Encounter: Payer: Self-pay | Admitting: Internal Medicine

## 2018-11-20 ENCOUNTER — Inpatient Hospital Stay: Payer: Self-pay

## 2018-11-20 DIAGNOSIS — E872 Acidosis: Secondary | ICD-10-CM

## 2018-11-20 DIAGNOSIS — B9629 Other Escherichia coli [E. coli] as the cause of diseases classified elsewhere: Secondary | ICD-10-CM

## 2018-11-20 DIAGNOSIS — Z1612 Extended spectrum beta lactamase (ESBL) resistance: Secondary | ICD-10-CM

## 2018-11-20 LAB — BASIC METABOLIC PANEL
Anion gap: 17 — ABNORMAL HIGH (ref 5–15)
BUN: 54 mg/dL — ABNORMAL HIGH (ref 8–23)
CO2: 15 mmol/L — ABNORMAL LOW (ref 22–32)
Calcium: 8.5 mg/dL — ABNORMAL LOW (ref 8.9–10.3)
Chloride: 117 mmol/L — ABNORMAL HIGH (ref 98–111)
Creatinine, Ser: 0.97 mg/dL (ref 0.44–1.00)
GFR calc Af Amer: >60 mL/min (ref >60)
GFR calc non Af Amer: 55 mL/min — ABNORMAL LOW (ref >60)
Glucose, Bld: 75 mg/dL (ref 70–99)
Potassium: 4.6 mmol/L (ref 3.5–5.1)
Sodium: 149 mmol/L — ABNORMAL HIGH (ref 135–145)

## 2018-11-20 LAB — URINE CULTURE: Culture: >=100000 — AB

## 2018-11-20 LAB — CBC
HCT: 39.8 % (ref 36.0–46.0)
Hemoglobin: 11.5 g/dL — ABNORMAL LOW (ref 12.0–15.0)
MCH: 28.4 pg (ref 26.0–34.0)
MCHC: 28.9 g/dL — ABNORMAL LOW (ref 30.0–36.0)
MCV: 98.3 fL (ref 80.0–100.0)
Platelets: 341 10*3/uL (ref 150–400)
RBC: 4.05 MIL/uL (ref 3.87–5.11)
RDW: 19.8 % — ABNORMAL HIGH (ref 11.5–15.5)
WBC: 16.7 10*3/uL — ABNORMAL HIGH (ref 4.0–10.5)
nRBC: 0.4 % — ABNORMAL HIGH (ref 0.0–0.2)

## 2018-11-20 MED ORDER — SODIUM BICARBONATE 8.4 % IV SOLN: INTRAVENOUS | Status: DC

## 2018-11-20 MED ORDER — SODIUM BICARBONATE 8.4 % IV SOLN
INTRAVENOUS | Status: DC
  Administered 2018-11-20 – 2018-11-22 (×5): via INTRAVENOUS
  Filled 2018-11-20 (×10): qty 100

## 2018-11-20 MED ORDER — FOSFOMYCIN TROMETHAMINE 3 G PO PACK
3.0000 g | PACK | Freq: Once | ORAL | Status: DC
  Filled 2018-11-20: qty 3

## 2018-11-20 NOTE — TOC Progression Note (Signed)
Transition of Care [facility] - Progression Note    Patient Details  Name: Autumn Bryant MRN: [id number] Date of Birth: November 21, 1935  Transition of Care [facility]) CM/SW [place], [place] Phone Number: 11/20/2018, 3:00 PM  Clinical Narrative:  Patient tentatively scheduled to return to [facility] tomorrow with PICC line and IV meropenem.  Called facility, who confirmed this plan is fine.  Will need FL2 upon return and negative COVID test within past 48 hours.      Expected Discharge Plan: [place] Barriers to Discharge: No Barriers Identified  Expected Discharge Plan and Services Expected Discharge Plan: [place]   Discharge Planning Services: CM Consult   Living arrangements for the past 2 months: [place]                                       Social Determinants of Health (SDOH) Interventions    Readmission Risk Interventions No flowsheet data found.

## 2018-11-20 NOTE — Progress Notes (Deleted)
Location:  [place] Room Number: 203-W Place of Service:  SNF (31)  Autumn Duos, MD  Patient Care Team: Autumn Duos, [name]  Extended Emergency Contact Information Primary Emergency Contact: [name] Address: [address]          [place], [place] [address] [name] of [place] Phone: [phone] Relation: Son Secondary Emergency Contact: [place], [place] of [place] Phone: [phone] Relation: Sister    Allergies: Demerol [meperidine]  Chief Complaint  Patient presents with  . Acute Visit    Patient is seen for dehydration.    HPI: Patient is an 83 y.o. female who   Past Medical History:  Diagnosis Date  . Anasarca 05/21/2016  . Anxiety 11/03/2016  . Closed TBI (traumatic brain injury) ([place]) 04/21/2016  . Dysphagia 07/13/2016  . Gout   . Hypertension   . Obesity (BMI 30.0-34.9) 05/21/2016  . Status post insertion of percutaneous endoscopic gastrostomy (PEG) tube ([place]) 04/21/2016    Past Surgical History:  Procedure Laterality Date  . CHOLECYSTECTOMY N/A 07/21/2017   Procedure: LAPAROSCOPIC CHOLECYSTECTOMY;  Surgeon: Erroll Luna, MD;  Location: [facility] [place];  Service: General;  Laterality: N/A;  . ERCP N/A 02/18/2018   Procedure: ENDOSCOPIC RETROGRADE CHOLANGIOPANCREATOGRAPHY (ERCP);  Surgeon: Milus Banister, MD;  Location: [facility];  Service: Endoscopy;  Laterality: N/A;  . INTRAMEDULLARY (IM) NAIL INTERTROCHANTERIC Right 12/27/2016   Procedure: INTRAMEDULLARY (IM) NAIL INTERTROCHANTRIC RIGHT HIP;  Surgeon: Paralee Cancel, MD;  Location: [facility];  Service: Orthopedics;  Laterality: Right;  . MASTECTOMY    . REMOVAL OF STONES  02/18/2018   Procedure: REMOVAL OF STONES;  Surgeon: Milus Banister, MD;  Location: [facility];  Service: Endoscopy;;  balloon sweep  . SPHINCTEROTOMY  02/18/2018   Procedure: SPHINCTEROTOMY;  Surgeon: Milus Banister, MD;  Location: [facility];  Service: Endoscopy;;    Allergies as of 11/16/2018      Reactions   Demerol [meperidine] Swelling      Medication List    Notice   This visit is during an admission. Changes to the med list made in this visit will be reflected in the After Visit Summary of the admission.     No orders of the defined types were placed in this encounter.   Immunization History  Administered Date(s) Administered  . Influenza-Unspecified 12/19/2016, 11/26/2017, 11/11/2018  . PPD Test 04/29/2016  . Pneumococcal Conjugate-13 08/06/2016  . Pneumococcal Polysaccharide-23 04/15/2016  . Tdap 03/31/2016    Social History   Tobacco Use  . Smoking status: Never Smoker  . Smokeless tobacco: Never Used  Substance Use Topics  . Alcohol use: Not Currently    Review of Systems  DATA OBTAINED: from patient, nurse, medical record, family member GENERAL:  no fevers, fatigue, appetite changes SKIN: No itching, rash HEENT: No complaint RESPIRATORY: No cough, wheezing, SOB CARDIAC: No chest pain, palpitations, lower extremity edema  GI: No abdominal pain, No N/V/D or constipation, No heartburn or reflux  GU: No dysuria, frequency or urgency, or incontinence  MUSCULOSKELETAL: No unrelieved bone/joint pain NEUROLOGIC: No headache, dizziness  PSYCHIATRIC: No overt anxiety or sadness  There were no vitals filed for this visit. There is no height or weight on file to calculate BMI. Physical Exam  GENERAL APPEARANCE: Alert, conversant, No acute distress  SKIN: No diaphoresis rash HEENT: Unremarkable RESPIRATORY: Breathing is even, unlabored.  Lung sounds are clear   CARDIOVASCULAR: Heart RRR no murmurs, rubs or gallops. No peripheral edema  GASTROINTESTINAL: Abdomen is soft, non-tender, not distended w/ normal bowel sounds.  GENITOURINARY: Bladder non tender, not distended  MUSCULOSKELETAL: No abnormal joints or musculature NEUROLOGIC: Cranial nerves 2-12 grossly  intact. Moves all extremities PSYCHIATRIC: Mood and affect appropriate to situation, no behavioral issues  Patient Active Problem List   Diagnosis Date Noted  . UTI (urinary tract infection) 11/18/2018  . Grand mal seizure ([place]) 08/16/2018  . Elevated WBC count 08/08/2018  . Abnormal chest x-ray 08/08/2018  . Metastatic cancer to lung of unknown cell type ([place]) 08/08/2018  . Hypernatremia 05/26/2018  . HCAP (healthcare-associated pneumonia) 05/18/2018  . Acute renal failure (ARF) ([place]) 05/07/2018  . MRSA (methicillin resistant Staphylococcus aureus) septicemia ([place]) 02/23/2018  . Hypokalemia 02/23/2018  . Bile duct stone   . E. coli sepsis ([place]) 02/17/2018  . Cholecystitis 07/31/2017  . Cholecystitis, acute with cholelithiasis 07/20/2017  . Pressure injury of skin 07/20/2017  . History of total right hip arthroplasty 01/05/2017  . Acute blood loss as cause of postoperative anemia 01/05/2017  . Escherichia coli urinary tract infection 01/05/2017  . Acute encephalopathy 01/05/2017  . Depression, recurrent ([place]) 01/05/2017  . Polyneuropathy 01/05/2017  . Closed right hip fracture ([place]) 12/25/2016  . Anxiety 11/03/2016  . Dysphagia 07/13/2016  . Anasarca 05/21/2016  . Obesity (BMI 30.0-34.9) 05/21/2016  . Closed TBI (traumatic brain injury) ([place]) 04/21/2016  . Status post insertion of percutaneous endoscopic gastrostomy (PEG) tube ([place]) 04/21/2016  . Facial laceration 04/21/2016  . Cheek wound, right, subsequent encounter 04/21/2016  . Urinary tract infection due to extended-spectrum beta lactamase (ESBL) producing Escherichia coli 04/21/2016  . Hypotension 04/21/2016  . Gout   . Hypertension     CMP     Component Value Date/Time   NA 149 (H) 11/20/2018 0532   NA 141 10/09/2018   NA 145 07/28/2017   K 4.6 11/20/2018 0532   K 3.7 07/28/2017   CL 117 (H) 11/20/2018 0532   CO2 15 (L) 11/20/2018 0532   GLUCOSE 75 11/20/2018 0532   BUN 54 (H) 11/20/2018 0532   BUN 16  10/09/2018   CREATININE 0.97 11/20/2018 0532   CREATININE 0.39 07/28/2017   CALCIUM 8.5 (L) 11/20/2018 0532   CALCIUM 8.1 07/28/2017   PROT 6.3 (L) 11/17/2018 2004   ALBUMIN 2.5 (L) 11/17/2018 2004   AST 25 11/17/2018 2004   ALT 11 11/17/2018 2004   ALKPHOS 140 (H) 11/17/2018 2004   BILITOT 0.8 11/17/2018 2004   GFRNONAA 55 (L) 11/20/2018 0532   GFRNONAA >90 07/28/2017   GFRAA >60 11/20/2018 0532   Recent Labs    02/16/18 2227  11/18/18 0354 11/19/18 1002 11/20/18 0532  NA  --    < > 144 147* 149*  K  --    < > 3.4* 4.0 4.6  CL  --    < > 114* 113* 117*  CO2  --    < > 20* 19* 15*  GLUCOSE  --    < > 95 97 75  BUN  --    < > 61* 59* 54*  CREATININE  --    < > 0.92 0.99 0.97  CALCIUM  --    < > 7.3* 8.4* 8.5*  MG 2.1  --   --   --   --    < > = values in this interval not displayed.   Recent Labs  02/21/18 0325 08/04/18 1358 09/18/18 11/17/18 2004  AST 30 17 14 25   ALT 33 12 5* 11  ALKPHOS 324* 82 90 140*  BILITOT 1.1 0.7  --  0.8  PROT 4.8* 7.2  --  6.3*  ALBUMIN 1.9* 2.9*  --  2.5*   Recent Labs    09/18/18 10/09/18 11/17/18 2004 11/18/18 0354 11/19/18 1002 11/20/18 0532  WBC 8.8 13.9 18.0* 16.1* 17.2* 16.7*  NEUTROABS 6 10 14.1*  --   --   --   HGB 12.9 10.8* 13.3 10.9* 11.8* 11.5*  HCT 38 34* 42.5 35.7* 40.1 39.8  MCV  --   --  93.8 93.9 96.4 98.3  PLT 268 181 369 314 339 341   No results for input(s): CHOL, LDLCALC, TRIG in the last 8760 hours.  Invalid input(s): HCL No results found for: MICROALBUR Lab Results  Component Value Date   TSH 1.68 08/06/2013   No results found for: HGBA1C No results found for: CHOL, HDL, LDLCALC, LDLDIRECT, TRIG, CHOLHDL  Significant Diagnostic Results in last 30 days:  Dg Chest 1 View  Result Date: 11/17/2018 CLINICAL DATA:  Decreased urine output, history of metastatic carcinoma to lung EXAM: CHEST  1 VIEW COMPARISON:  08/04/2018 FINDINGS: Innumerable bilateral pulmonary nodules consistent with metastatic  disease, progressed since 08/04/2018 comparison. No pleural effusion. Normal heart size. Tortuous arch with atherosclerosis. Possible right hilar node. No pneumothorax. Chronic deformity of the right humerus. Post mastectomy changes on the right. IMPRESSION: Innumerable pulmonary nodules consistent with metastatic disease, progressed as compared with prior radiograph from June 2020. Possible right hilar adenopathy. Electronically Signed   By: Donavan Foil M.D.   On: 11/17/2018 20:19   Dg Knee 2 Views Left  Result Date: 11/17/2018 CLINICAL DATA:  Left thigh pain EXAM: LEFT KNEE - 1-2 VIEW COMPARISON:  None FINDINGS: Bones appear osteopenic. No gross fracture or malalignment. Advanced tricompartment arthritis of the knee with probable trace effusion. Diffuse soft tissue swelling. Vascular calcifications. IMPRESSION: Osteopenia. Advanced tricompartment arthritis without gross evidence for acute fracture Electronically Signed   By: Donavan Foil M.D.   On: 11/17/2018 20:14   Dg Abd 1 View  Result Date: 11/17/2018 CLINICAL DATA:  Left greater than right hip pain EXAM: ABDOMEN - 1 VIEW COMPARISON:  10/18/2017 FINDINGS: Patient is rotated. Surgical clips in the right upper quadrant. Nonobstructed bowel-gas pattern with impacted feces at the rectum. Chronic fracture deformity of the proximal left femur. IMPRESSION: Nonobstructed bowel-gas pattern with impacted feces at the rectum Electronically Signed   By: Donavan Foil M.D.   On: 11/17/2018 20:12   Dg Hip Unilat With Pelvis 2-3 Views Left  Result Date: 11/17/2018 CLINICAL DATA:  Left greater than right hip pain EXAM: DG HIP (WITH OR WITHOUT PELVIS) 2-3V LEFT COMPARISON:  10/18/2017 FINDINGS: Limited by patient positioning. Pubic bones are obscured by rectal stool. Intramedullary rod within the right femur with chronic fracture deformity. Chronic fracture deformity at left femoral neck with progressed bony resorptive changes at the femoral neck and  trochanter. Superior and lateral displacement of the residual distal femur with respect to the hip joint. Vascular calcifications. Smooth cortex thickening or periosteal reaction without change. IMPRESSION: 1. Chronic fracture deformity of the left femoral neck with progressed bony resorptive changes at the femoral neck and trochanter, now with superior and lateral displacement of the residual femoral shaft with respect to the hip joint. 2. Surgical hardware right hip with chronic fracture deformity Electronically Signed   By: Donavan Foil  M.D.   On: 11/17/2018 20:16   [place] Ekg Site Rite  Result Date: 11/20/2018 If Site Rite image not attached, placement could not be confirmed due to current cardiac rhythm.   Assessment and Plan  No problem-specific Assessment & Plan notes found for this encounter.   Labs/tests ordered:    Autumn Duos, MD

## 2018-11-20 NOTE — Progress Notes (Signed)
Location:  [facility] Room Number: 203-W Place of Service:  SNF (31)  Autumn Duos, MD  Patient Care Team: Autumn Duos, [name]  Extended Emergency Contact Information Primary Emergency Contact: [name] Address: [address]          [place], [place] [address] Autumn Bryant of [place] Phone: [phone] Relation: Son Secondary Emergency Contact: [place], [place] of [place] Phone: [phone] Relation: Sister    Allergies: Demerol [meperidine]  Chief Complaint  Patient presents with  . Acute Visit    Patient is seen for dehydration    HPI: Patient is an 83 y.o. female who is being seen for poor p.o. intake, worse than usual for the past several days.  Patient has presumed ovarian cancer with known metastatic disease in her lungs and has been failing to thrive for quite a while.  Despite repeated please by staff patient's son will not make his mother a DNR.  Past Medical History:  Diagnosis Date  . Anasarca 05/21/2016  . Anxiety 11/03/2016  . Closed TBI (traumatic brain injury) ([place]) 04/21/2016  . Dysphagia 07/13/2016  . Gout   . Hypertension   . Obesity (BMI 30.0-34.9) 05/21/2016  . Status post insertion of percutaneous endoscopic gastrostomy (PEG) tube ([place]) 04/21/2016    Past Surgical History:  Procedure Laterality Date  . CHOLECYSTECTOMY N/A 07/21/2017   Procedure: LAPAROSCOPIC CHOLECYSTECTOMY;  Surgeon: Erroll Luna, MD;  Location: [facility];  Service: General;  Laterality: N/A;  . ERCP N/A 02/18/2018   Procedure: ENDOSCOPIC RETROGRADE CHOLANGIOPANCREATOGRAPHY (ERCP);  Surgeon: Milus Banister, MD;  Location: [facility];  Service: Endoscopy;  Laterality: N/A;  . INTRAMEDULLARY (IM) NAIL INTERTROCHANTERIC Right 12/27/2016   Procedure: INTRAMEDULLARY (IM) NAIL INTERTROCHANTRIC RIGHT HIP;  Surgeon: Paralee Cancel, MD;  Location: [facility];  Service:  Orthopedics;  Laterality: Right;  . MASTECTOMY    . REMOVAL OF STONES  02/18/2018   Procedure: REMOVAL OF STONES;  Surgeon: Milus Banister, MD;  Location: [facility];  Service: Endoscopy;;  balloon sweep  . SPHINCTEROTOMY  02/18/2018   Procedure: SPHINCTEROTOMY;  Surgeon: Milus Banister, MD;  Location: [facility];  Service: Endoscopy;;    Allergies as of 11/16/2018      Reactions   Demerol [meperidine] Swelling      Medication List    Notice   This visit is during an admission. Changes to the med list made in this visit will be reflected in the After Visit Summary of the admission.    Current Outpatient Medications on File Prior to Visit  Medication Sig Dispense Refill  . acetaminophen (TYLENOL) 325 MG tablet Take 650 mg by mouth every 8 (eight) hours. Routine    . Amino Acids-Protein Hydrolys (FEEDING SUPPLEMENT, PRO-STAT SUGAR FREE 64,) LIQD Take 30 mLs by mouth 2 (two) times daily.     . bimatoprost (LUMIGAN) 0.01 % SOLN Place 1 drop into both eyes at bedtime. For glaucoma    . bisacodyl (DULCOLAX) 10 MG suppository Place 10 mg rectally daily as needed (constipation not relieved by MOM).    . ENSURE (ENSURE) Take 237 mLs by mouth 3 (three) times daily. Prefers chocolate if available    . escitalopram (LEXAPRO) 5 MG tablet Take 5 mg by mouth daily.    . hydrochlorothiazide (HYDRODIURIL) 25 MG tablet Take 12.5 mg by mouth daily. Take 0.5 tablet to =  12.5 mg    . levETIRAcetam (KEPPRA) 500 MG tablet Take 500 mg by mouth 2 (two) times daily.     . Magnesium Hydroxide (MILK OF MAGNESIA PO) Take 30 mLs by mouth daily as needed (if no BM in 3 days).    . MULTIPLE VITAMINS-MINERALS PO Take 1 tablet by mouth daily.     . Nutritional Supplements (NUTRITIONAL SUPPLEMENT PO) Take 1 each by mouth daily. Magic Cup with lunch    . potassium chloride SA (K-DUR,KLOR-CON) 20 MEQ tablet Take 20 mEq by mouth daily.     . sennosides-docusate sodium (SENOKOT-S) 8.6-50 MG tablet Take 2 tablets by  mouth 2 (two) times daily as needed for constipation.    . Sodium Phosphates (FLEET ENEMA RE) Place 1 each rectally daily as needed (constipation not relieved by bisacodyl suppository).     No current facility-administered medications on file prior to visit.      No orders of the defined types were placed in this encounter.   Immunization History  Administered Date(s) Administered  . Influenza-Unspecified 12/19/2016, 11/26/2017, 11/11/2018  . PPD Test 04/29/2016  . Pneumococcal Conjugate-13 08/06/2016  . Pneumococcal Polysaccharide-23 04/15/2016  . Tdap 03/31/2016    Social History   Tobacco Use  . Smoking status: Never Smoker  . Smokeless tobacco: Never Used  Substance Use Topics  . Alcohol use: Not Currently    Review of   Systems     unable to obtain secondary to somnolence of patient and dementia; nursing-concern for poor p.o. intake and decreased urine output     Vitals:   11/20/18 1618  BP: 124/74  Pulse: 74  Resp: 20  Temp: (!) 96.9 F (36.1 C)   Body mass index is 20.04 kg/m. Physical Exam  GENERAL APPEARANCE: Somnolent, nonconversant no acute distress  SKIN: No diaphoresis rash HEENT: Unremarkable RESPIRATORY: Breathing is even, unlabored. Lung sounds are clear   CARDIOVASCULAR: Heart RRR no murmurs, rubs or gallops. No peripheral edema  GASTROINTESTINAL: Abdomen is soft, non-tender, not distended w/ normal bowel sounds.  GENITOURINARY: Bladder non tender, not distended  MUSCULOSKELETAL: No abnormal joints or musculature NEUROLOGIC: Cranial nerves 2-12 grossly intact. Moves all extremities minimally PSYCHIATRIC: Somnolent, dementia  Patient Active Problem List   Diagnosis Date Noted  . UTI (urinary tract infection) 11/18/2018  . Grand mal seizure ([place]) 08/16/2018  . Elevated WBC count 08/08/2018  . Abnormal chest x-ray 08/08/2018  . Metastatic cancer to lung of unknown cell type ([place]) 08/08/2018  . Hypernatremia 05/26/2018  . HCAP  (healthcare-associated pneumonia) 05/18/2018  . Acute renal failure (ARF) ([place]) 05/07/2018  . MRSA (methicillin resistant Staphylococcus aureus) septicemia ([place]) 02/23/2018  . Hypokalemia 02/23/2018  . Bile duct stone   . E. coli sepsis ([place]) 02/17/2018  . Cholecystitis 07/31/2017  . Cholecystitis, acute with cholelithiasis 07/20/2017  . Pressure injury of skin 07/20/2017  . History of total right hip arthroplasty 01/05/2017  . Acute blood loss as cause of postoperative anemia 01/05/2017  . Escherichia coli urinary tract infection 01/05/2017  . Acute encephalopathy 01/05/2017  . Depression, recurrent ([place]) 01/05/2017  . Polyneuropathy 01/05/2017  . Closed right hip fracture ([place]) 12/25/2016  . Anxiety 11/03/2016  . Dysphagia 07/13/2016  . Anasarca 05/21/2016  . Obesity (BMI 30.0-34.9) 05/21/2016  . Closed TBI (traumatic brain injury) ([place]) 04/21/2016  . Status post insertion of percutaneous endoscopic gastrostomy (PEG) tube ([place]) 04/21/2016  . Facial laceration 04/21/2016  . Cheek wound, right, subsequent encounter 04/21/2016  . Urinary tract infection due to  extended-spectrum beta lactamase (ESBL) producing Escherichia coli 04/21/2016  . Hypotension 04/21/2016  . Gout   . Hypertension     CMP     Component Value Date/Time   NA 149 (H) 11/20/2018 0532   NA 141 10/09/2018   NA 145 07/28/2017   K 4.6 11/20/2018 0532   K 3.7 07/28/2017   CL 117 (H) 11/20/2018 0532   CO2 15 (L) 11/20/2018 0532   GLUCOSE 75 11/20/2018 0532   BUN 54 (H) 11/20/2018 0532   BUN 16 10/09/2018   CREATININE 0.97 11/20/2018 0532   CREATININE 0.39 07/28/2017   CALCIUM 8.5 (L) 11/20/2018 0532   CALCIUM 8.1 07/28/2017   PROT 6.3 (L) 11/17/2018 2004   ALBUMIN 2.5 (L) 11/17/2018 2004   AST 25 11/17/2018 2004   ALT 11 11/17/2018 2004   ALKPHOS 140 (H) 11/17/2018 2004   BILITOT 0.8 11/17/2018 2004   GFRNONAA 55 (L) 11/20/2018 0532   GFRNONAA >90 07/28/2017   GFRAA >60 11/20/2018 0532   Recent  Labs    02/16/18 2227  11/18/18 0354 11/19/18 1002 11/20/18 0532  NA  --    < > 144 147* 149*  K  --    < > 3.4* 4.0 4.6  CL  --    < > 114* 113* 117*  CO2  --    < > 20* 19* 15*  GLUCOSE  --    < > 95 97 75  BUN  --    < > 61* 59* 54*  CREATININE  --    < > 0.92 0.99 0.97  CALCIUM  --    < > 7.3* 8.4* 8.5*  MG 2.1  --   --   --   --    < > = values in this interval not displayed.   Recent Labs    02/21/18 0325 08/04/18 1358 09/18/18 11/17/18 2004  AST 30 17 14 25   ALT 33 12 5* 11  ALKPHOS 324* 82 90 140*  BILITOT 1.1 0.7  --  0.8  PROT 4.8* 7.2  --  6.3*  ALBUMIN 1.9* 2.9*  --  2.5*   Recent Labs    09/18/18 10/09/18 11/17/18 2004 11/18/18 0354 11/19/18 1002 11/20/18 0532  WBC 8.8 13.9 18.0* 16.1* 17.2* 16.7*  NEUTROABS 6 10 14.1*  --   --   --   HGB 12.9 10.8* 13.3 10.9* 11.8* 11.5*  HCT 38 34* 42.5 35.7* 40.1 39.8  MCV  --   --  93.8 93.9 96.4 98.3  PLT 268 181 369 314 339 341   No results for input(s): CHOL, LDLCALC, TRIG in the last 8760 hours.  Invalid input(s): HCL No results found for: MICROALBUR Lab Results  Component Value Date   TSH 1.68 08/06/2013   No results found for: HGBA1C No results found for: CHOL, HDL, LDLCALC, LDLDIRECT, TRIG, CHOLHDL  Significant Diagnostic Results in last 30 days:  Dg Chest 1 View  Result Date: 11/17/2018 CLINICAL DATA:  Decreased urine output, history of metastatic carcinoma to lung EXAM: CHEST  1 VIEW COMPARISON:  08/04/2018 FINDINGS: Innumerable bilateral pulmonary nodules consistent with metastatic disease, progressed since 08/04/2018 comparison. No pleural effusion. Normal heart size. Tortuous arch with atherosclerosis. Possible right hilar node. No pneumothorax. Chronic deformity of the right humerus. Post mastectomy changes on the right. IMPRESSION: Innumerable pulmonary nodules consistent with metastatic disease, progressed as compared with prior radiograph from June 2020. Possible right hilar adenopathy.  Electronically Signed   By: Donavan Foil  M.D.   On: 11/17/2018 20:19   Dg Knee 2 Views Left  Result Date: 11/17/2018 CLINICAL DATA:  Left thigh pain EXAM: LEFT KNEE - 1-2 VIEW COMPARISON:  None FINDINGS: Bones appear osteopenic. No gross fracture or malalignment. Advanced tricompartment arthritis of the knee with probable trace effusion. Diffuse soft tissue swelling. Vascular calcifications. IMPRESSION: Osteopenia. Advanced tricompartment arthritis without gross evidence for acute fracture Electronically Signed   By: Donavan Foil M.D.   On: 11/17/2018 20:14   Dg Abd 1 View  Result Date: 11/17/2018 CLINICAL DATA:  Left greater than right hip pain EXAM: ABDOMEN - 1 VIEW COMPARISON:  10/18/2017 FINDINGS: Patient is rotated. Surgical clips in the right upper quadrant. Nonobstructed bowel-gas pattern with impacted feces at the rectum. Chronic fracture deformity of the proximal left femur. IMPRESSION: Nonobstructed bowel-gas pattern with impacted feces at the rectum Electronically Signed   By: Donavan Foil M.D.   On: 11/17/2018 20:12   Dg Hip Unilat With Pelvis 2-3 Views Left  Result Date: 11/17/2018 CLINICAL DATA:  Left greater than right hip pain EXAM: DG HIP (WITH OR WITHOUT PELVIS) 2-3V LEFT COMPARISON:  10/18/2017 FINDINGS: Limited by patient positioning. Pubic bones are obscured by rectal stool. Intramedullary rod within the right femur with chronic fracture deformity. Chronic fracture deformity at left femoral neck with progressed bony resorptive changes at the femoral neck and trochanter. Superior and lateral displacement of the residual distal femur with respect to the hip joint. Vascular calcifications. Smooth cortex thickening or periosteal reaction without change. IMPRESSION: 1. Chronic fracture deformity of the left femoral neck with progressed bony resorptive changes at the femoral neck and trochanter, now with superior and lateral displacement of the residual femoral shaft with respect to  the hip joint. 2. Surgical hardware right hip with chronic fracture deformity Electronically Signed   By: Donavan Foil M.D.   On: 11/17/2018 20:16   [place] Ekg Site Rite  Result Date: 11/20/2018 If Site Rite image not attached, placement could not be confirmed due to current cardiac rhythm.   Assessment and Plan  Failure to thrive/metastatic cancer/dehydration-despite having metastatic cancer and visibly failing patient son will not make her a DNR.  No one at the facility has been able to get in contact with him recently.  Because she is a full code I have to prolong her suffering and start IV fluids; will try to get her to drink more with 120 cc of water every 2 hours 8 times daily while awake; will continue to monitor; will continue to try to get in touch with patient's son   Time spent greater than 35 minutes;> 50% of time with patient was spent reviewing records, labs, tests and studies, counseling and developing plan of care  Autumn Duos, MD

## 2018-11-20 NOTE — Progress Notes (Signed)
Pt care ( turning Q 2 hours) is nearly impossible with Autumn Bryant as she cries out even with simple touching or moving of her arms & legs. Left hip/ leg deformity make positioning on left side a challenge even when medicated with pain meds, Knowing her repeated comments of " wait a minute" are r/t cognitive decline she is unable to state what we are to wait on. I was unable to get pt to take any PO intake for fluids or meds thus far.

## 2018-11-20 NOTE — Progress Notes (Signed)
PROGRESS NOTE                                                                                                                                                                                                             Patient Demographics:    Autumn Bryant, is a 83 y.o. female, DOB - [birth year], XMI:[id number]  Admit date - 11/17/2018   Admitting Physician Orene Desanctis, DO  Outpatient Primary MD for the patient is [name], MD  LOS - 2  Outpatient Specialists: none  Chief Complaint  Patient presents with  . Abnormal Lab    elevated white count        Brief Narrative  83 year old female, resident of SNF with advanced dementia, failure to thrive history of traumatic brain injury with seizures, metastatic lung cancer (of unknown primary), ESBL UTI with chronic indwelling Foley was sent from SNF with poor p.o. intake and low urine output. As per SNF patient has had very poor p.o. intake for past few days and no urine output from the Foley.  Lab work showed elevated BUN (59) and creatinine of 1.06.  She had elevated WBC of 15 K and sodium of 143.  Patient sent to the ED where she was found to be mildly tachycardic, elevated lactic acid of 2.9, WBC of 18 and AKI with creatinine 1.13. UA showed large leukocyte and many bacteria.  Abdominal x-ray showed fecal impaction.  Also showed multiple bilateral pulmonary nodules consistent with metastatic disease which had progressed since her last imaging in June.  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  Foley change in the ED, started to drain urine and culture sent.  Placed on empiric antibiotic and admitted to medical floor for sepsis secondary to UTI.   Subjective:   Still noncommunicative and yelling out frequently.  Minimal p.o. intake.    Assessment  & Plan :   Principal problem Sepsis secondary to ESBL UTI. Prior history of ESBL with chronic indwelling Foley catheter.  Sepsis currently resolved.  Foley change in the ED. urine culture sent.  On empiric IV meropenem.   will place PICC line and discharge her on IV meropenem.  Active symptoms Advance dementia  with acute metabolic encephalopathy Patient poorly communicative and bedbound.  Will avoid any narcotics or benzodiazepines.  Acute kidney injury ([place]) Combination of prerenal azotemia due to poor p.o. intake and UTI with sepsis. Resolved with fluids.  Hypernatremia and metabolic acidosis Secondary to dehydration.  Switch fluids to d5 with bicarb.  Constipation with fecal impaction Continue senna/docusate, MiraLAX daily if tolerated.  Added bisacodyl suppository daily.  Sacral wound Wound care consulted.  Having difficulty to reposition the patient.  Essential hypertension Blood pressure soft.  Holding home meds.  Hypokalemia Replenished  History of seizures Continue Keppra  Advanced dementia and failure to thrive. Nutrition consult.  Metastatic cancer to the lung Unknown primary.  Does not appear any work-up has been done.    Goals of care. Have been attempting to calling her son multiple times and goes straight to his voicemail.  Have left messages. Patient has guarded prognosis overall.  As per social work note and my discussion with [name] at the SNF, goals of care were discussed with patient's son in the past and he expressed her to receive full scope of treatment.  Given her significant functional decline the providers and the nursing homes staff have tried to reach out to the son again recently but have not been successful.  I have discussed with [name] that once patient is better hydrated and PICC line placed in she will be sent back attempt to have further goals of care discussion with the son and he agrees with the plan.   Code  Status : Full code  Family Communication  : Unable to reach son on the phone  Disposition Plan  : Return to SNF possibly in the next 24- hours, following PICC line placed, further IV hydration  Barriers For Discharge : Active symptoms  Consults  : None  Procedures  : None  DVT Prophylaxis  :  Lovenox -   Lab Results  Component Value Date   PLT 341 11/20/2018    Antibiotics  :    Anti-infectives (From admission, onward)   Start     Dose/Rate Route Frequency Ordered Stop   11/18/18 0100  meropenem (MERREM) 1 g in sodium chloride 0.9 % 100 mL IVPB     1 g 200 mL/hr over 30 Minutes Intravenous Every 12 hours 11/18/18 0039     11/17/18 2045  ceFEPIme (MAXIPIME) 2 g in sodium chloride 0.9 % 100 mL IVPB     2 g 200 mL/hr over 30 Minutes Intravenous  Once 11/17/18 2034 11/17/18 2125   11/17/18 2045  metroNIDAZOLE (FLAGYL) IVPB 500 mg     500 mg 100 mL/hr over 60 Minutes Intravenous  Once 11/17/18 2034 11/17/18 2338   11/17/18 2045  vancomycin (VANCOCIN) IVPB 1000 mg/200 mL premix     1,000 mg 200 mL/hr over 60 Minutes Intravenous  Once 11/17/18 2034 11/17/18 2338        Objective:   Vitals:   11/19/18 0619 11/19/18 1334 11/19/18 2001 11/20/18 0546  BP: 120/86 117/81 113/77 136/77  Pulse: 99 (!) 107 95 74  Resp: 16 18 20 16   Temp: 98 F (36.7 C) (!) 97.5 F (36.4 C) 97.6 F (36.4 C) 97.7 F (36.5 C)  TempSrc: Oral Axillary Oral Axillary  SpO2: 96% 94% 97% 94%  Weight:      Height:        Wt Readings from Last 3 Encounters:  11/17/18 54.6 kg  11/17/18 54.6 kg  11/11/18 51.6 kg  Intake/Output Summary (Last 24 hours) at 11/20/2018 1110 Last data filed at 11/19/2018 1900 Gross per 24 hour  Intake 655.34 ml  Output 240 ml  Net 415.34 ml   Physical exam Sleepy but arousable, irritable, nonverbal HEENT: Dry mucosa, supple neck Chest: Clear bilaterally CVs: Normal S1-S2 GI: Soft, nondistended, nontender, bowel sound present, Foley draining clear urine  Musculoskeletal: Warm, no edema CNS: Nonverbal, not oriented    Data Review:    CBC Recent Labs  Lab 11/17/18 2004 11/18/18 0354 11/19/18 1002 11/20/18 0532  WBC 18.0* 16.1* 17.2* 16.7*  HGB 13.3 10.9* 11.8* 11.5*  HCT 42.5 35.7* 40.1 39.8  PLT 369 314 339 341  MCV 93.8 93.9 96.4 98.3  MCH 29.4 28.7 28.4 28.4  MCHC 31.3 30.5 29.4* 28.9*  RDW 19.6* 19.3* 19.8* 19.8*  LYMPHSABS 2.7  --   --   --   MONOABS 0.9  --   --   --   EOSABS 0.1  --   --   --   BASOSABS 0.1  --   --   --     Chemistries  Recent Labs  Lab 11/17/18 2004 11/18/18 0354 11/19/18 1002 11/20/18 0532  NA 143 144 147* 149*  K 4.3 3.4* 4.0 4.6  CL 107 114* 113* 117*  CO2 23 20* 19* 15*  GLUCOSE 118* 95 97 75  BUN 70* 61* 59* 54*  CREATININE 1.13* 0.92 0.99 0.97  CALCIUM 9.0 7.3* 8.4* 8.5*  AST 25  --   --   --   ALT 11  --   --   --   ALKPHOS 140*  --   --   --   BILITOT 0.8  --   --   --    ------------------------------------------------------------------------------------------------------------------ No results for input(s): CHOL, HDL, LDLCALC, TRIG, CHOLHDL, LDLDIRECT in the last 72 hours.  No results found for: HGBA1C ------------------------------------------------------------------------------------------------------------------ No results for input(s): TSH, T4TOTAL, T3FREE, THYROIDAB in the last 72 hours.  Invalid input(s): FREET3 ------------------------------------------------------------------------------------------------------------------ No results for input(s): VITAMINB12, FOLATE, FERRITIN, TIBC, IRON, RETICCTPCT in the last 72 hours.  Coagulation profile Recent Labs  Lab 11/17/18 2034  INR 1.1    No results for input(s): DDIMER in the last 72 hours.  Cardiac Enzymes No results for input(s): CKMB, TROPONINI, MYOGLOBIN in the last 168 hours.  Invalid input(s): CK  ------------------------------------------------------------------------------------------------------------------ No results found for: BNP  Inpatient Medications  Scheduled Meds: . Chlorhexidine Gluconate Cloth  6 each Topical Daily  . collagenase   Topical Daily  . enoxaparin (LOVENOX) injection  40 mg Subcutaneous Q24H  . escitalopram  5 mg Oral Daily  . feeding supplement (ENSURE ENLIVE)  237 mL Oral TID BM  . feeding supplement (PRO-STAT SUGAR FREE 64)  30 mL Oral BID  . free water  250 mL Oral Q8H  . multivitamin  15 mL Oral Daily  . polyethylene glycol  17 g Oral Daily  . potassium chloride SA  20 mEq Oral Daily   Continuous Infusions: . levETIRAcetam 500 mg (11/20/18 0224)  . meropenem (MERREM) IV 1 g (11/20/18 0530)  .  sodium bicarbonate  infusion 1000 mL 100 mL/hr at 11/20/18 0905   PRN Meds:.HYDROcodone-acetaminophen, morphine injection, senna-docusate  Micro Results Recent Results (from the past 240 hour(s))  Culture, blood (routine x 2)     Status: None (Preliminary result)   Collection Time: 11/17/18  8:04 PM   Specimen: BLOOD  Result Value Ref Range Status   Specimen Description   Final  BLOOD LEFT ANTECUBITAL Performed at [facility] [address], [place], [place] [address]    Special Requests   Final    BOTTLES DRAWN AEROBIC AND ANAEROBIC Blood Culture adequate volume Performed at [facility] [address], [place], [place] [address]    Culture   Final    NO GROWTH 3 DAYS Performed at [facility], [place] [address] [place], [place] [address]    Report Status PENDING  Incomplete  Culture, blood (routine x 2)     Status: None (Preliminary result)   Collection Time: 11/17/18  8:04 PM   Specimen: BLOOD  Result Value Ref Range Status   Specimen Description   Final    BLOOD BLOOD LEFT WRIST Performed at [facility] [address], [place], [place] [address]    Special Requests    Final    BOTTLES DRAWN AEROBIC AND ANAEROBIC Blood Culture adequate volume Performed at [facility] [address] [place], [place] [address]    Culture   Final    NO GROWTH 3 DAYS Performed at [facility], [place] [address] [place], [place] [address]    Report Status PENDING  Incomplete  Urine culture     Status: Abnormal   Collection Time: 11/17/18  8:04 PM   Specimen: Urine, Random  Result Value Ref Range Status   Specimen Description   Final    URINE, RANDOM Performed at [facility] [address] [place], [place] [address]    Special Requests   Final    NONE Performed at [facility], [place] [address], [place],  [address]    Culture (A)  Final    >=100,000 COLONIES/mL ESCHERICHIA COLI Confirmed Extended Spectrum Beta-Lactamase Producer (ESBL).  In bloodstream infections from ESBL organisms, carbapenems are preferred over piperacillin/tazobactam. They are shown to have a lower risk of mortality.    Report Status 11/20/2018 FINAL  Final   Organism ID, Bacteria ESCHERICHIA COLI (A)  Final      Susceptibility   Escherichia coli - MIC*    AMPICILLIN >=32 RESISTANT Resistant     CEFAZOLIN >=64 RESISTANT Resistant     CEFTRIAXONE >=64 RESISTANT Resistant     CIPROFLOXACIN >=4 RESISTANT Resistant     GENTAMICIN <=1 SENSITIVE Sensitive     IMIPENEM <=0.25 SENSITIVE Sensitive     NITROFURANTOIN <=16 SENSITIVE Sensitive     TRIMETH/SULFA >=320 RESISTANT Resistant     AMPICILLIN/SULBACTAM 16 INTERMEDIATE Intermediate     PIP/TAZO <=4 SENSITIVE Sensitive     Extended ESBL POSITIVE Resistant     * >=100,000 COLONIES/mL ESCHERICHIA COLI  SARS CORONAVIRUS 2 (TAT 6-24 HRS) Nasopharyngeal Nasopharyngeal Swab     Status: None   Collection Time: 11/17/18 11:29 PM   Specimen: Nasopharyngeal Swab  Result Value Ref Range Status   SARS Coronavirus 2 NEGATIVE NEGATIVE Final    Comment: (NOTE) SARS-CoV-2 target nucleic  acids are NOT DETECTED. The SARS-CoV-2 RNA is generally detectable in upper and lower respiratory specimens during the acute phase of infection. Negative results do not preclude SARS-CoV-2 infection, do not rule out co-infections with other pathogens, and should not be used as the sole basis for treatment or other patient management decisions. Negative results must be combined with clinical observations, patient history, and epidemiological information. The expected result is Negative. Fact Sheet for Patients: SugarRoll.be Fact Sheet for Healthcare Providers: https://www.woods-mathews.com/ This test is not yet approved or cleared  by the [place] and  has been authorized for detection and/or diagnosis of SARS-CoV-2 by FDA under an Emergency Use Authorization (EUA). This EUA will remain  in effect (meaning this test can be used) for the duration of the COVID-19 declaration under Section 56 4(b)(1) of the Act, 21 U.S.C. section 360bbb-3(b)(1), unless the authorization is terminated or revoked sooner. Performed at [facility], [place] [address], [place], [place] [address]     Radiology Reports Dg Chest 1 View  Result Date: 11/17/2018 CLINICAL DATA:  Decreased urine output, history of metastatic carcinoma to lung EXAM: CHEST  1 VIEW COMPARISON:  08/04/2018 FINDINGS: Innumerable bilateral pulmonary nodules consistent with metastatic disease, progressed since 08/04/2018 comparison. No pleural effusion. Normal heart size. Tortuous arch with atherosclerosis. Possible right hilar node. No pneumothorax. Chronic deformity of the right humerus. Post mastectomy changes on the right. IMPRESSION: Innumerable pulmonary nodules consistent with metastatic disease, progressed as compared with prior radiograph from June 2020. Possible right hilar adenopathy. Electronically Signed   By: Donavan Foil M.D.   On: 11/17/2018 20:19   Dg Knee 2 Views Left   Result Date: 11/17/2018 CLINICAL DATA:  Left thigh pain EXAM: LEFT KNEE - 1-2 VIEW COMPARISON:  None FINDINGS: Bones appear osteopenic. No gross fracture or malalignment. Advanced tricompartment arthritis of the knee with probable trace effusion. Diffuse soft tissue swelling. Vascular calcifications. IMPRESSION: Osteopenia. Advanced tricompartment arthritis without gross evidence for acute fracture Electronically Signed   By: Donavan Foil M.D.   On: 11/17/2018 20:14   Dg Abd 1 View  Result Date: 11/17/2018 CLINICAL DATA:  Left greater than right hip pain EXAM: ABDOMEN - 1 VIEW COMPARISON:  10/18/2017 FINDINGS: Patient is rotated. Surgical clips in the right upper quadrant. Nonobstructed bowel-gas pattern with impacted feces at the rectum. Chronic fracture deformity of the proximal left femur. IMPRESSION: Nonobstructed bowel-gas pattern with impacted feces at the rectum Electronically Signed   By: Donavan Foil M.D.   On: 11/17/2018 20:12   Dg Hip Unilat With Pelvis 2-3 Views Left  Result Date: 11/17/2018 CLINICAL DATA:  Left greater than right hip pain EXAM: DG HIP (WITH OR WITHOUT PELVIS) 2-3V LEFT COMPARISON:  10/18/2017 FINDINGS: Limited by patient positioning. Pubic bones are obscured by rectal stool. Intramedullary rod within the right femur with chronic fracture deformity. Chronic fracture deformity at left femoral neck with progressed bony resorptive changes at the femoral neck and trochanter. Superior and lateral displacement of the residual distal femur with respect to the hip joint. Vascular calcifications. Smooth cortex thickening or periosteal reaction without change. IMPRESSION: 1. Chronic fracture deformity of the left femoral neck with progressed bony resorptive changes at the femoral neck and trochanter, now with superior and lateral displacement of the residual femoral shaft with respect to the hip joint. 2. Surgical hardware right hip with chronic fracture deformity Electronically  Signed   By: Donavan Foil M.D.   On: 11/17/2018 20:16    Time Spent in minutes  35  Charlyn Vialpando M.D on 11/20/2018 at 11:10 AM  Between 7am to 7pm - Pager - [phone]  After 7pm go to [email or web address] - password [facility]  [facility] -  Office  [phone]

## 2018-11-20 NOTE — Progress Notes (Signed)
  Speech Language Pathology Treatment: Dysphagia  Patient Details Name: Autumn Bryant MRN: [id number] DOB: 1935-02-15 Today's Date: 11/20/2018 Time: 1115-1130 SLP Time Calculation (min) (ACUTE ONLY): 15 min  Assessment / Plan / Recommendation Clinical Impression  Today pt continues with poor intake, appearing to be asleep upon SLP entrance to room.  She will however accept po intake often when lip is stimulated with spoon - using icecream, pudding and juice - this was helpful.  She only accepted a few bites, straw sips with this SLP before sealing her lips tightly to avoid further intake.  No indication of aspiration with all po however pt with delay and caution will need to be used with feeding her.  Pt then accepted more intake to take her medications *indicative of some awarenss to need to take medication* and proceeded to consume 4 ounces of pudding.  Recommend to continue full liquid diet for efficiency and safety.  Updated swallow precaution sign to assure pt is being offered intake but not being "forced" to eat; Given poor intake, nutritional concerns present.    HPI HPI: 83 yo female resident of SNF with h/o lung cancer and dementia admitted with FTT and decreased mental status - ? UTI with h/o ESBL-UTI .  Concern for possible UTI.  Pt has had prior admits for FTT and old CT 02/17/2018 showed atrophy, old right frontal encephalomalacia, CXR 02/16/2018 left base scarring.  Pt also has a lung malignant neoplasm, pressure ulcer Stage 3, seizure d/o.  She has fecal rectum impaction per Abdomen 11/17/2018.        SLP Plan  Continue with current plan of care       Recommendations  Liquids provided via: Cup;Straw;Teaspoon Medication Administration: Crushed with puree Supervision: Full supervision/cueing for compensatory strategies;Staff to assist with self feeding Compensations: Slow rate;Small sips/bites(oral suction prn, assure clearance of solids and that pt elicited swallow) Postural  Changes and/or Swallow Maneuvers: Seated upright 90 degrees;Upright 30-60 min after meal                Oral Care Recommendations: Oral care QID SLP Visit Diagnosis: Dysphagia, oral phase (R13.11) Plan: Continue with current plan of care       [place], [facility] SLP Annetta Pager [phone] Office [phone]  Macario Golds 11/20/2018, 2:54 PM

## 2018-11-20 NOTE — Progress Notes (Signed)
Pt. Passed a very large blood clot from vaginal area with mild bleeding. Her backside also developed new abrasions and petechiae. MD made aware.

## 2018-11-20 NOTE — Care Management Important Message (Signed)
Important Message  Patient Details IM Letter given to [facility] SW to present to the Patient Name: Autumn Bryant MRN: [id number] Date of Birth: [birth year]   Medicare Important Message Given:  Yes     Kerin Salen 11/20/2018, 10:39 AM

## 2018-11-21 ENCOUNTER — Inpatient Hospital Stay: Payer: Self-pay

## 2018-11-21 LAB — SARS CORONAVIRUS 2 (TAT 6-24 HRS): SARS Coronavirus 2: NEGATIVE

## 2018-11-21 LAB — CBC
HCT: 38.5 % (ref 36.0–46.0)
HCT: 38.8 % (ref 36.0–46.0)
Hemoglobin: 11.4 g/dL — ABNORMAL LOW (ref 12.0–15.0)
Hemoglobin: 11.6 g/dL — ABNORMAL LOW (ref 12.0–15.0)
MCH: 28.3 pg (ref 26.0–34.0)
MCH: 28.6 pg (ref 26.0–34.0)
MCHC: 29.4 g/dL — ABNORMAL LOW (ref 30.0–36.0)
MCHC: 30.1 g/dL (ref 30.0–36.0)
MCV: 93.9 fL (ref 80.0–100.0)
MCV: 97.5 fL (ref 80.0–100.0)
Platelets: 286 10*3/uL (ref 150–400)
Platelets: 353 10*3/uL (ref 150–400)
RBC: 3.98 MIL/uL (ref 3.87–5.11)
RBC: 4.1 MIL/uL (ref 3.87–5.11)
RDW: 19.9 % — ABNORMAL HIGH (ref 11.5–15.5)
RDW: 20.1 % — ABNORMAL HIGH (ref 11.5–15.5)
WBC: 17.1 10*3/uL — ABNORMAL HIGH (ref 4.0–10.5)
WBC: 17.5 10*3/uL — ABNORMAL HIGH (ref 4.0–10.5)
nRBC: 0.2 % (ref 0.0–0.2)
nRBC: 0.3 % — ABNORMAL HIGH (ref 0.0–0.2)

## 2018-11-21 LAB — BASIC METABOLIC PANEL
Anion gap: 12 (ref 5–15)
BUN: 49 mg/dL — ABNORMAL HIGH (ref 8–23)
CO2: 24 mmol/L (ref 22–32)
Calcium: 8.1 mg/dL — ABNORMAL LOW (ref 8.9–10.3)
Chloride: 111 mmol/L (ref 98–111)
Creatinine, Ser: 0.83 mg/dL (ref 0.44–1.00)
GFR calc Af Amer: >60 mL/min (ref >60)
GFR calc non Af Amer: >60 mL/min (ref >60)
Glucose, Bld: 146 mg/dL — ABNORMAL HIGH (ref 70–99)
Potassium: 3.5 mmol/L (ref 3.5–5.1)
Sodium: 147 mmol/L — ABNORMAL HIGH (ref 135–145)

## 2018-11-21 MED ORDER — SODIUM CHLORIDE 0.9% FLUSH
10.0000 mL | Freq: Two times a day (BID) | INTRAVENOUS | Status: DC
  Administered 2018-11-22: 10 mL

## 2018-11-21 MED ORDER — SODIUM CHLORIDE 0.9% FLUSH: 10.0000 mL | INTRAVENOUS | Status: DC | PRN

## 2018-11-21 NOTE — Progress Notes (Signed)
Attempted to call son, went straight to voicemail.

## 2018-11-21 NOTE — Progress Notes (Signed)
PHARMACY NOTE -  Meropenem  Pharmacy has been assisting with dosing of Meropenem for ESBL UTI.  Dosage remains stable at 1g IV q8 hr and need for further dosage adjustment appears unlikely at present given SCr stable and at baseline  Pharmacy will sign off, following peripherally for culture results or dose adjustments. Please reconsult if a change in clinical status warrants re-evaluation of dosage.  Reuel Boom, PharmD, BCPS [phone] 11/21/2018, 2:27 PM

## 2018-11-21 NOTE — Progress Notes (Signed)
PROGRESS NOTE                                                                                                                                                                                                             Patient Demographics:    Autumn Bryant, is a 83 y.o. female, DOB - [birth year], ZOX:[id number]  Admit date - 11/17/2018   Admitting Physician Orene Desanctis, DO  Outpatient Primary MD for the patient is [name], MD  LOS - 3  Outpatient Specialists: none  Chief Complaint  Patient presents with  . Abnormal Lab    elevated white count        Brief Narrative  83 year old female, resident of SNF with advanced dementia, failure to thrive history of traumatic brain injury with seizures, metastatic lung cancer (of unknown primary), ESBL UTI with chronic indwelling Foley was sent from SNF with poor p.o. intake and low urine output. As per SNF patient has had very poor p.o. intake for past few days and no urine output from the Foley.  Lab work showed elevated BUN (59) and creatinine of 1.06.  She had elevated WBC of 15 K and sodium of 143.  Patient sent to the ED where she was found to be mildly tachycardic, elevated lactic acid of 2.9, WBC of 18 and AKI with creatinine 1.13. UA showed large leukocyte and many bacteria.  Abdominal x-ray showed fecal impaction.  Also showed multiple bilateral pulmonary nodules consistent with metastatic disease which had progressed since her last imaging in June.  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  Foley change in the ED, started to drain urine and culture sent.  Placed on empiric antibiotic and admitted to medical floor for sepsis secondary to UTI.   Subjective:  reported vaginal bleeding yesterday.  Further episode today.  H&H stable.   Patient unable to swallow 1-2 pills with applesauce.  Unable to drink any liquid.   Assessment  & Plan :   Principal problem Sepsis secondary to ESBL UTI. Prior history of ESBL with chronic indwelling Foley catheter.  Sepsis currently resolved.  Foley change in the ED. urine culture sent.  Received empiric IV meropenem.  Attempted  to give her a dose of oral fosfomycin and discharged however patient unable to swallow liquid.  Will order for PICC line and place her back on IV meropenem.  Active symptoms Advance dementia with acute metabolic encephalopathy Patient poorly communicative and bedbound.  Will avoid any narcotics or benzodiazepines.  Acute kidney injury ([place]) Combination of prerenal azotemia due to poor p.o. intake and UTI with sepsis. Resolved with fluids.  Hypernatremia and metabolic acidosis Secondary to dehydration.  Improving with D5 and bicarb.  Constipation with fecal impaction Continue senna/docusate, MiraLAX daily if tolerated.  Added bisacodyl suppository daily.  Sacral wound Wound care consult appreciated.  Essential hypertension Blood pressure soft.  Holding home meds.  Hypokalemia Replenished  History of seizures Continue Keppra  Advanced dementia and failure to thrive. Nutrition consult.  Metastatic cancer to the lung Unknown primary.  Does not appear any work-up has been done.    Goals of care. Have been attempting to calling her son multiple times and goes straight to his voicemail.  Have left messages several times without an answer.. Patient has guarded prognosis overall.  As per social work note and my discussion with [name] at the SNF, goals of care were discussed with patient's son in the past and he expressed her to receive full scope of treatment.  Given her significant functional decline the providers and the nursing homes staff have tried to reach out to the son again recently but have not been successful.  I have discussed with Mr  Oscar La that once patient is better hydrated and PICC line placed in she will be sent back attempt to have further goals of care discussion with the son and he agrees with the plan.   Code Status : Full code  Family Communication  : Unable to reach son on the phone  Disposition Plan  : PICC line ordered. Possible discharge to SNF   Barriers For Discharge : Active symptoms  Consults  : None  Procedures  : None  DVT Prophylaxis  :  Lovenox -   Lab Results  Component Value Date   PLT 286 11/21/2018    Antibiotics  :    Anti-infectives (From admission, onward)   Start     Dose/Rate Route Frequency Ordered Stop   11/18/18 0100  meropenem (MERREM) 1 g in sodium chloride 0.9 % 100 mL IVPB     1 g 200 mL/hr over 30 Minutes Intravenous Every 12 hours 11/18/18 0039     11/17/18 2045  ceFEPIme (MAXIPIME) 2 g in sodium chloride 0.9 % 100 mL IVPB     2 g 200 mL/hr over 30 Minutes Intravenous  Once 11/17/18 2034 11/17/18 2125   11/17/18 2045  metroNIDAZOLE (FLAGYL) IVPB 500 mg     500 mg 100 mL/hr over 60 Minutes Intravenous  Once 11/17/18 2034 11/17/18 2338   11/17/18 2045  vancomycin (VANCOCIN) IVPB 1000 mg/200 mL premix     1,000 mg 200 mL/hr over 60 Minutes Intravenous  Once 11/17/18 2034 11/17/18 2338        Objective:   Vitals:   11/20/18 0546 11/20/18 1410 11/21/18 0518 11/21/18 1137  BP: 136/77 111/75 110/68 109/78  Pulse: 74 89 93 92  Resp: 16 14 20 20   Temp: 97.7 F (36.5 C) (!) 97.1 F (36.2 C) 97.7 F (36.5 C) (!) 97.5 F (36.4 C)  TempSrc: Axillary Axillary Oral Oral  SpO2: 94% 96% 100%   Weight:      Height:  Wt Readings from Last 3 Encounters:  11/17/18 54.6 kg  11/17/18 54.6 kg  11/20/18 54.6 kg     Intake/Output Summary (Last 24 hours) at 11/21/2018 1148 Last data filed at 11/20/2018 1905 Gross per 24 hour  Intake 996.58 ml  Output -  Net 996.58 ml   Physical exam More arousable today but still confused and poorly communicative HEENT:  Dry mucosa, supple neck Chest: Clear CVs: Normal S1-S2 GI: Soft, nondistended, nontender, Foley draining clear urine Musculoskeletal: Warm, no edema     Data Review:    CBC Recent Labs  Lab 11/17/18 2004 11/18/18 0354 11/19/18 1002 11/20/18 0532 11/21/18 0017 11/21/18 0548  WBC 18.0* 16.1* 17.2* 16.7* 17.1* 17.5*  HGB 13.3 10.9* 11.8* 11.5* 11.4* 11.6*  HCT 42.5 35.7* 40.1 39.8 38.8 38.5  PLT 369 314 339 341 353 286  MCV 93.8 93.9 96.4 98.3 97.5 93.9  MCH 29.4 28.7 28.4 28.4 28.6 28.3  MCHC 31.3 30.5 29.4* 28.9* 29.4* 30.1  RDW 19.6* 19.3* 19.8* 19.8* 20.1* 19.9*  LYMPHSABS 2.7  --   --   --   --   --   MONOABS 0.9  --   --   --   --   --   EOSABS 0.1  --   --   --   --   --   BASOSABS 0.1  --   --   --   --   --     Chemistries  Recent Labs  Lab 11/17/18 2004 11/18/18 0354 11/19/18 1002 11/20/18 0532 11/21/18 0548  NA 143 144 147* 149* 147*  K 4.3 3.4* 4.0 4.6 3.5  CL 107 114* 113* 117* 111  CO2 23 20* 19* 15* 24  GLUCOSE 118* 95 97 75 146*  BUN 70* 61* 59* 54* 49*  CREATININE 1.13* 0.92 0.99 0.97 0.83  CALCIUM 9.0 7.3* 8.4* 8.5* 8.1*  AST 25  --   --   --   --   ALT 11  --   --   --   --   ALKPHOS 140*  --   --   --   --   BILITOT 0.8  --   --   --   --    ------------------------------------------------------------------------------------------------------------------ No results for input(s): CHOL, HDL, LDLCALC, TRIG, CHOLHDL, LDLDIRECT in the last 72 hours.  No results found for: HGBA1C ------------------------------------------------------------------------------------------------------------------ No results for input(s): TSH, T4TOTAL, T3FREE, THYROIDAB in the last 72 hours.  Invalid input(s): FREET3 ------------------------------------------------------------------------------------------------------------------ No results for input(s): VITAMINB12, FOLATE, FERRITIN, TIBC, IRON, RETICCTPCT in the last 72 hours.  Coagulation profile Recent Labs   Lab 11/17/18 2034  INR 1.1    No results for input(s): DDIMER in the last 72 hours.  Cardiac Enzymes No results for input(s): CKMB, TROPONINI, MYOGLOBIN in the last 168 hours.  Invalid input(s): CK ------------------------------------------------------------------------------------------------------------------ No results found for: BNP  Inpatient Medications  Scheduled Meds: . Chlorhexidine Gluconate Cloth  6 each Topical Daily  . collagenase   Topical Daily  . enoxaparin (LOVENOX) injection  40 mg Subcutaneous Q24H  . escitalopram  5 mg Oral Daily  . feeding supplement (ENSURE ENLIVE)  237 mL Oral TID BM  . feeding supplement (PRO-STAT SUGAR FREE 64)  30 mL Oral BID  . fosfomycin  3 g Oral Once  . free water  250 mL Oral Q8H  . multivitamin  15 mL Oral Daily  . polyethylene glycol  17 g Oral Daily  . potassium chloride SA  20 mEq  Oral Daily   Continuous Infusions: . levETIRAcetam 500 mg (11/20/18 2033)  . meropenem (MERREM) IV 1 g (11/21/18 0136)  .  sodium bicarbonate  infusion 1000 mL 100 mL/hr at 11/21/18 0803   PRN Meds:.HYDROcodone-acetaminophen, morphine injection, senna-docusate  Micro Results Recent Results (from the past 240 hour(s))  Culture, blood (routine x 2)     Status: None (Preliminary result)   Collection Time: 11/17/18  8:04 PM   Specimen: BLOOD  Result Value Ref Range Status   Specimen Description   Final    BLOOD LEFT ANTECUBITAL Performed at [facility] [address] [place], [place] [address]    Special Requests   Final    BOTTLES DRAWN AEROBIC AND ANAEROBIC Blood Culture adequate volume Performed at [facility] [address] [place], [place] [address]    Culture   Final    NO GROWTH 4 DAYS Performed at [facility], [place] [address] [place], [place] [address]    Report Status PENDING  Incomplete  Culture, blood (routine x 2)     Status: None (Preliminary result)   Collection Time:  11/17/18  8:04 PM   Specimen: BLOOD  Result Value Ref Range Status   Specimen Description   Final    BLOOD BLOOD LEFT WRIST Performed at [facility] [name], [place], [place] [address]    Special Requests   Final    BOTTLES DRAWN AEROBIC AND ANAEROBIC Blood Culture adequate volume Performed at [facility] [address], [place], [place] [address]    Culture   Final    NO GROWTH 4 DAYS Performed at [facility], [place] [address], [place], [place] [address]    Report Status PENDING  Incomplete  Urine culture     Status: Abnormal   Collection Time: 11/17/18  8:04 PM   Specimen: Urine, Random  Result Value Ref Range Status   Specimen Description   Final    URINE, RANDOM Performed at [facility] [address], [place], [place] [address]    Special Requests   Final    NONE Performed at [facility], [place] [address], [place], [place] [address]    Culture (A)  Final    >=100,000 COLONIES/mL ESCHERICHIA COLI Confirmed Extended Spectrum Beta-Lactamase Producer (ESBL).  In bloodstream infections from ESBL organisms, carbapenems are preferred over piperacillin/tazobactam. They are shown to have a lower risk of mortality.    Report Status 11/20/2018 FINAL  Final   Organism ID, Bacteria ESCHERICHIA COLI (A)  Final      Susceptibility   Escherichia coli - MIC*    AMPICILLIN >=32 RESISTANT Resistant     CEFAZOLIN >=64 RESISTANT Resistant     CEFTRIAXONE >=64 RESISTANT Resistant     CIPROFLOXACIN >=4 RESISTANT Resistant     GENTAMICIN <=1 SENSITIVE Sensitive     IMIPENEM <=0.25 SENSITIVE Sensitive     NITROFURANTOIN <=16 SENSITIVE Sensitive     TRIMETH/SULFA >=320 RESISTANT Resistant     AMPICILLIN/SULBACTAM 16 INTERMEDIATE Intermediate     PIP/TAZO <=4 SENSITIVE Sensitive     Extended ESBL POSITIVE Resistant     * >=100,000 COLONIES/mL ESCHERICHIA COLI  SARS CORONAVIRUS 2 (TAT 6-24 HRS)  Nasopharyngeal Nasopharyngeal Swab     Status: None   Collection Time: 11/17/18 11:29 PM   Specimen: Nasopharyngeal Swab  Result Value Ref Range Status   SARS Coronavirus 2 NEGATIVE NEGATIVE Final    Comment: (  NOTE) SARS-CoV-2 target nucleic acids are NOT DETECTED. The SARS-CoV-2 RNA is generally detectable in upper and lower respiratory specimens during the acute phase of infection. Negative results do not preclude SARS-CoV-2 infection, do not rule out co-infections with other pathogens, and should not be used as the sole basis for treatment or other patient management decisions. Negative results must be combined with clinical observations, patient history, and epidemiological information. The expected result is Negative. Fact Sheet for Patients: SugarRoll.be Fact Sheet for Healthcare Providers: https://www.woods-mathews.com/ This test is not yet approved or cleared by the [place] FDA and  has been authorized for detection and/or diagnosis of SARS-CoV-2 by FDA under an Emergency Use Authorization (EUA). This EUA will remain  in effect (meaning this test can be used) for the duration of the COVID-19 declaration under Section 56 4(b)(1) of the Act, 21 U.S.C. section 360bbb-3(b)(1), unless the authorization is terminated or revoked sooner. Performed at [facility], [place] [address], [place], [place] [address]   SARS CORONAVIRUS 2 (TAT 6-24 HRS) Nasopharyngeal Nasopharyngeal Swab     Status: None   Collection Time: 11/20/18 11:26 AM   Specimen: Nasopharyngeal Swab  Result Value Ref Range Status   SARS Coronavirus 2 NEGATIVE NEGATIVE Final    Comment: (NOTE) SARS-CoV-2 target nucleic acids are NOT DETECTED. The SARS-CoV-2 RNA is generally detectable in upper and lower respiratory specimens during the acute phase of infection. Negative results do not preclude SARS-CoV-2 infection, do not rule out co-infections with other pathogens,  and should not be used as the sole basis for treatment or other patient management decisions. Negative results must be combined with clinical observations, patient history, and epidemiological information. The expected result is Negative. Fact Sheet for Patients: SugarRoll.be Fact Sheet for Healthcare Providers: https://www.woods-mathews.com/ This test is not yet approved or cleared by the [place] FDA and  has been authorized for detection and/or diagnosis of SARS-CoV-2 by FDA under an Emergency Use Authorization (EUA). This EUA will remain  in effect (meaning this test can be used) for the duration of the COVID-19 declaration under Section 56 4(b)(1) of the Act, 21 U.S.C. section 360bbb-3(b)(1), unless the authorization is terminated or revoked sooner. Performed at [facility], [place] [address] [place], [place] [address]     Radiology Reports Dg Chest 1 View  Result Date: 11/17/2018 CLINICAL DATA:  Decreased urine output, history of metastatic carcinoma to lung EXAM: CHEST  1 VIEW COMPARISON:  08/04/2018 FINDINGS: Innumerable bilateral pulmonary nodules consistent with metastatic disease, progressed since 08/04/2018 comparison. No pleural effusion. Normal heart size. Tortuous arch with atherosclerosis. Possible right hilar node. No pneumothorax. Chronic deformity of the right humerus. Post mastectomy changes on the right. IMPRESSION: Innumerable pulmonary nodules consistent with metastatic disease, progressed as compared with prior radiograph from June 2020. Possible right hilar adenopathy. Electronically Signed   By: Donavan Foil M.D.   On: 11/17/2018 20:19   Dg Knee 2 Views Left  Result Date: 11/17/2018 CLINICAL DATA:  Left thigh pain EXAM: LEFT KNEE - 1-2 VIEW COMPARISON:  None FINDINGS: Bones appear osteopenic. No gross fracture or malalignment. Advanced tricompartment arthritis of the knee with probable trace effusion. Diffuse  soft tissue swelling. Vascular calcifications. IMPRESSION: Osteopenia. Advanced tricompartment arthritis without gross evidence for acute fracture Electronically Signed   By: Donavan Foil M.D.   On: 11/17/2018 20:14   Dg Abd 1 View  Result Date: 11/17/2018 CLINICAL DATA:  Left greater than right hip pain EXAM: ABDOMEN - 1 VIEW COMPARISON:  10/18/2017 FINDINGS: Patient is rotated. Surgical clips in the right upper quadrant. Nonobstructed bowel-gas pattern with impacted feces at the rectum. Chronic fracture deformity of the proximal left femur. IMPRESSION: Nonobstructed bowel-gas pattern with impacted feces at the rectum Electronically Signed   By: Donavan Foil M.D.   On: 11/17/2018 20:12   Dg Hip Unilat With Pelvis 2-3 Views Left  Result Date: 11/17/2018 CLINICAL DATA:  Left greater than right hip pain EXAM: DG HIP (WITH OR WITHOUT PELVIS) 2-3V LEFT COMPARISON:  10/18/2017 FINDINGS: Limited by patient positioning. Pubic bones are obscured by rectal stool. Intramedullary rod within the right femur with chronic fracture deformity. Chronic fracture deformity at left femoral neck with progressed bony resorptive changes at the femoral neck and trochanter. Superior and lateral displacement of the residual distal femur with respect to the hip joint. Vascular calcifications. Smooth cortex thickening or periosteal reaction without change. IMPRESSION: 1. Chronic fracture deformity of the left femoral neck with progressed bony resorptive changes at the femoral neck and trochanter, now with superior and lateral displacement of the residual femoral shaft with respect to the hip joint. 2. Surgical hardware right hip with chronic fracture deformity Electronically Signed   By: Donavan Foil M.D.   On: 11/17/2018 20:16   [place] Ekg Site Rite  Result Date: 11/21/2018 If Site Rite image not attached, placement could not be confirmed due to current cardiac rhythm.  [place] Ekg Site Rite  Result Date: 11/20/2018 If Site Rite  image not attached, placement could not be confirmed due to current cardiac rhythm.   Time Spent in minutes  25  Abagail Limb M.D on 11/21/2018 at 11:48 AM  Between 7am to 7pm - Pager - [phone]  After 7pm go to [email or web address] - password [facility]  [facility] -  Office  [phone]

## 2018-11-21 NOTE — Progress Notes (Signed)
SPoke with Dr Clementeen Kimmel re PICC. Unable to contact son.  Midline appropriate for med ordered for 4-6 days.  Order given to place midline.

## 2018-11-21 NOTE — Progress Notes (Signed)
Heavy vaginal bleeding with clots noted. MD notified.

## 2018-11-21 NOTE — Progress Notes (Signed)
CSW in contact with nursing staff who informed CSW that patient is still awaiting PIC line placement. Pt discharge pending this procedure. Pt then discharging to [facility]. PIC placement may not be today.  TOC staff will continue to follow patient for discharge.  [facility]  Clinical Social Worker  Ph: [phone]

## 2018-11-22 ENCOUNTER — Encounter: Payer: Self-pay | Admitting: Internal Medicine

## 2018-11-22 DIAGNOSIS — A419 Sepsis, unspecified organism: Secondary | ICD-10-CM | POA: Diagnosis not present

## 2018-11-22 DIAGNOSIS — L89153 Pressure ulcer of sacral region, stage 3: Secondary | ICD-10-CM | POA: Diagnosis not present

## 2018-11-22 DIAGNOSIS — M810 Age-related osteoporosis without current pathological fracture: Secondary | ICD-10-CM | POA: Diagnosis not present

## 2018-11-22 DIAGNOSIS — R569 Unspecified convulsions: Secondary | ICD-10-CM | POA: Diagnosis not present

## 2018-11-22 DIAGNOSIS — M6281 Muscle weakness (generalized): Secondary | ICD-10-CM | POA: Diagnosis not present

## 2018-11-22 DIAGNOSIS — I10 Essential (primary) hypertension: Secondary | ICD-10-CM | POA: Diagnosis not present

## 2018-11-22 DIAGNOSIS — N390 Urinary tract infection, site not specified: Secondary | ICD-10-CM | POA: Diagnosis present

## 2018-11-22 DIAGNOSIS — D649 Anemia, unspecified: Secondary | ICD-10-CM | POA: Diagnosis not present

## 2018-11-22 DIAGNOSIS — F339 Major depressive disorder, recurrent, unspecified: Secondary | ICD-10-CM | POA: Diagnosis not present

## 2018-11-22 DIAGNOSIS — R627 Adult failure to thrive: Secondary | ICD-10-CM

## 2018-11-22 DIAGNOSIS — Z7189 Other specified counseling: Secondary | ICD-10-CM | POA: Diagnosis not present

## 2018-11-22 DIAGNOSIS — E43 Unspecified severe protein-calorie malnutrition: Secondary | ICD-10-CM | POA: Diagnosis not present

## 2018-11-22 DIAGNOSIS — R6520 Severe sepsis without septic shock: Secondary | ICD-10-CM

## 2018-11-22 DIAGNOSIS — E870 Hyperosmolality and hypernatremia: Secondary | ICD-10-CM | POA: Diagnosis not present

## 2018-11-22 DIAGNOSIS — N170 Acute kidney failure with tubular necrosis: Secondary | ICD-10-CM

## 2018-11-22 DIAGNOSIS — E860 Dehydration: Secondary | ICD-10-CM | POA: Insufficient documentation

## 2018-11-22 DIAGNOSIS — G40409 Other generalized epilepsy and epileptic syndromes, not intractable, without status epilepticus: Secondary | ICD-10-CM | POA: Diagnosis not present

## 2018-11-22 DIAGNOSIS — M109 Gout, unspecified: Secondary | ICD-10-CM | POA: Diagnosis not present

## 2018-11-22 DIAGNOSIS — I509 Heart failure, unspecified: Secondary | ICD-10-CM | POA: Diagnosis not present

## 2018-11-22 DIAGNOSIS — C7800 Secondary malignant neoplasm of unspecified lung: Secondary | ICD-10-CM | POA: Diagnosis not present

## 2018-11-22 DIAGNOSIS — E876 Hypokalemia: Secondary | ICD-10-CM | POA: Diagnosis not present

## 2018-11-22 DIAGNOSIS — B9629 Other Escherichia coli [E. coli] as the cause of diseases classified elsewhere: Secondary | ICD-10-CM | POA: Diagnosis not present

## 2018-11-22 DIAGNOSIS — C801 Malignant (primary) neoplasm, unspecified: Secondary | ICD-10-CM | POA: Diagnosis not present

## 2018-11-22 DIAGNOSIS — S72002A Fracture of unspecified part of neck of left femur, initial encounter for closed fracture: Secondary | ICD-10-CM

## 2018-11-22 DIAGNOSIS — R1312 Dysphagia, oropharyngeal phase: Secondary | ICD-10-CM | POA: Diagnosis not present

## 2018-11-22 DIAGNOSIS — G92 Toxic encephalopathy: Secondary | ICD-10-CM | POA: Diagnosis not present

## 2018-11-22 DIAGNOSIS — G300 Alzheimer's disease with early onset: Secondary | ICD-10-CM | POA: Diagnosis not present

## 2018-11-22 DIAGNOSIS — N179 Acute kidney failure, unspecified: Secondary | ICD-10-CM | POA: Diagnosis not present

## 2018-11-22 DIAGNOSIS — Z1612 Extended spectrum beta lactamase (ESBL) resistance: Secondary | ICD-10-CM | POA: Diagnosis not present

## 2018-11-22 DIAGNOSIS — G9340 Encephalopathy, unspecified: Secondary | ICD-10-CM | POA: Diagnosis not present

## 2018-11-22 LAB — CULTURE, BLOOD (ROUTINE X 2)
Culture: NO GROWTH
Culture: NO GROWTH
Special Requests: ADEQUATE
Special Requests: ADEQUATE

## 2018-11-22 LAB — BASIC METABOLIC PANEL
Anion gap: 10 (ref 5–15)
BUN: 45 mg/dL — ABNORMAL HIGH (ref 8–23)
CO2: 29 mmol/L (ref 22–32)
Calcium: 7.7 mg/dL — ABNORMAL LOW (ref 8.9–10.3)
Chloride: 106 mmol/L (ref 98–111)
Creatinine, Ser: 0.89 mg/dL (ref 0.44–1.00)
GFR calc Af Amer: >60 mL/min (ref >60)
GFR calc non Af Amer: >60 mL/min (ref >60)
Glucose, Bld: 181 mg/dL — ABNORMAL HIGH (ref 70–99)
Potassium: 3.2 mmol/L — ABNORMAL LOW (ref 3.5–5.1)
Sodium: 145 mmol/L (ref 135–145)

## 2018-11-22 MED ORDER — SODIUM CHLORIDE 0.9 % IV SOLN: 1.0000 g | Freq: Two times a day (BID) | INTRAVENOUS | 0 refills | Status: DC

## 2018-11-22 MED ORDER — SODIUM CHLORIDE 0.9 % IV SOLN: 1.0000 g | Freq: Two times a day (BID) | INTRAVENOUS | 0 refills | Status: AC

## 2018-11-22 NOTE — TOC Transition Note (Signed)
Transition of Care [facility]) - CM/SW Discharge Note   Patient Details  Name: Autumn Bryant MRN: [id number] Date of Birth: 1935/03/24  [facility] CM/SW Contact:  [place], LCSW Phone Number: 11/22/2018, 1:03 PM   Clinical Narrative:   Pt is scheduled to discharge on today to [facility] via [facility]. Number to call for report is PH: [phone].     Final next level of care: Skilled Nursing Facility Barriers to Discharge: No Barriers Identified   Patient Goals and CMS Choice Patient states their goals for this hospitalization and ongoing recovery are:: to discharge to SNF for short term rehab      Discharge Placement              Patient chooses bed at: [facility] Patient to be transferred to facility by: [facility] Name of family member notified: Natasha Bence. [phone] Patient and family notified of of transfer: 11/22/18  Discharge Plan and Services   Discharge Planning Services: CM Consult                                 Social Determinants of Health (SDOH) Interventions     Readmission Risk Interventions No flowsheet data found.

## 2018-11-22 NOTE — Progress Notes (Signed)
Autumn Bryant to be D/C'd Skilled nursing facility per MD order.  Discussed prescriptions and follow up appointments with the patient. Prescriptions given to patient, medication list explained in detail. Pt verbalized understanding.  Allergies as of 11/22/2018      Reactions   Demerol [meperidine] Swelling      Medication List    STOP taking these medications   HYDROcodone-acetaminophen 5-325 MG tablet Commonly known as: Norco     TAKE these medications   acetaminophen 325 MG tablet Commonly known as: TYLENOL Take 650 mg by mouth every 8 (eight) hours. Routine   bimatoprost 0.01 % Soln Commonly known as: LUMIGAN Place 1 drop into both eyes at bedtime. For glaucoma   bisacodyl 10 MG suppository Commonly known as: DULCOLAX Place 10 mg rectally daily as needed (constipation not relieved by MOM).   Ensure Take 237 mLs by mouth 3 (three) times daily. Prefers chocolate if available   NUTRITIONAL SUPPLEMENT PO Take 1 each by mouth daily. Magic Cup with lunch   escitalopram 5 MG tablet Commonly known as: LEXAPRO Take 5 mg by mouth daily.   feeding supplement (PRO-STAT SUGAR FREE 64) Liqd Take 30 mLs by mouth 2 (two) times daily.   FLEET ENEMA RE Place 1 each rectally daily as needed (constipation not relieved by bisacodyl suppository).   hydrochlorothiazide 25 MG tablet Commonly known as: HYDRODIURIL Take 12.5 mg by mouth daily. Take 0.5 tablet to = 12.5 mg   levETIRAcetam 500 MG tablet Commonly known as: KEPPRA Take 500 mg by mouth 2 (two) times daily.   meropenem 1 g in sodium chloride 0.9 % 100 mL Inject 1 g into the vein every 12 (twelve) hours for 2 days.   MILK OF MAGNESIA PO Take 30 mLs by mouth daily as needed (if no BM in 3 days).   MULTIPLE VITAMINS-MINERALS PO Take 1 tablet by mouth daily.   potassium chloride SA 20 MEQ tablet Commonly known as: KLOR-CON Take 20 mEq by mouth daily.   sennosides-docusate sodium 8.6-50 MG tablet Commonly known as:  SENOKOT-S Take 2 tablets by mouth 2 (two) times daily as needed for constipation.   sodium chloride 0.45 % solution Inject 75 mLs into the vein every hour.   Vitamin D (Ergocalciferol) 1.25 MG (50000 UT) Caps capsule Commonly known as: DRISDOL Take 50,000 Units by mouth every 7 (seven) days.       Vitals:   11/22/18 0620 11/22/18 1359  BP: 130/72 111/60  Pulse: 98 100  Resp: (!) 21 12  Temp: 97.8 F (36.6 C) 97.7 F (36.5 C)  SpO2: 98% 98%    Skin clean, dry and intact without evidence of skin break down, no evidence of skin tears noted. IV catheter discontinued intact. Site without signs and symptoms of complications. Dressing and pressure applied. Pt denies pain at this time. No complaints noted. Pt to be discharged with L cephalic midline and foley catheter in place. Report called and given to receiving RN at [facility].   An After Visit Summary was printed and given to [name]. Patient to transfer to SNF via [facility].  Autumn Bryant S 11/22/2018 2:07 PM

## 2018-11-22 NOTE — Discharge Summary (Addendum)
Physician Discharge Summary  Autumn Bryant TZG:[id number] DOB: July 06, 1935 DOA: 11/17/2018  PCP: Autumn Duos, MD  Admit date: 11/17/2018 Discharge date: 11/22/2018  Admitted From: Autumn Bryant from SNF Disposition: Return to SNF  Recommendations for Outpatient Follow-up:  #1 follow up with MD at SNF within 1 week. 2.  Need to arrange goals of care discussion with the son as soon as possible once he responds the phone calls #3 patient will complete 7 days of IV antibiotics (meropenem) after 10/20.  Home Health: None Equipment/Devices: None)  Discharge Condition: Guarded CODE STATUS: Full code Diet recommendation: As tolerated   Discharge Diagnoses:  Principal Problem:   UTI due to extended-spectrum beta lactamase (ESBL) producing Escherichia coli  Active Problems:   Dysphagia   Pressure injury of skin   Hypernatremia   Metastatic cancer to lung of unknown cell type (HCC)   FTT (failure to thrive) in adult   Sepsis (HCC) Severe dementia with acute toxic metabolic encephalopathy Acute kidney injury   Brief narrative/HPI 83 year old female, resident of SNF with advanced dementia, failure to thrive history of traumatic brain injury with seizures, metastatic lung cancer (of unknown primary), ESBL UTI with chronic indwelling Foley was sent from SNF with poor p.o. intake and low urine output. As per SNF patient has had very poor p.o. intake for past few days and no urine output from the Foley.  Lab work showed elevated BUN (59) and creatinine of 1.06.  She had elevated WBC of 15 K and sodium of 143.  Patient sent to the ED where she was found to be mildly tachycardic, elevated lactic acid of 2.9, WBC of 18 and AKI with creatinine 1.13. UA showed large leukocyte and many bacteria.  Abdominal x-ray showed fecal impaction.  Also showed multiple bilateral pulmonary nodules consistent with metastatic disease which had progressed since her last imaging in June.  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  Foley change in the ED, started to drain urine and culture sent.  Placed on empiric antibiotic and admitted to medical floor for sepsis secondary to UTI.  Hospital course   Principal problem Sepsis secondary to ESBL UTI. Prior history of ESBL with chronic indwelling Foley catheter.  Sepsis currently resolved.  Foley change in the ED. urine culture sent.  Received empiric IV meropenem.  Attempted to give her a dose of oral fosfomycin and discharged however patient unable to swallow liquid.  Will order for PICC line and place her back on IV meropenem until 10/20 to complete 7 day course.  Active symptoms Advance dementia with acute metabolic encephalopathy Patient poorly communicative and bedbound.  Will avoid any narcotics or benzodiazepines for now.  Acute kidney injury ([place]) Combination of prerenal azotemia due to poor p.o. intake and UTI with sepsis. Resolved with fluids.  Hypernatremia and metabolic acidosis Secondary to dehydration.    Improved with D5 and bicarb.  Constipation with fecal impaction Continue senna/docusate, MiraLAX daily if tolerated.  Added bisacodyl suppository daily.  Sacral wound Wound care consult appreciated.  Essential hypertension Blood pressure soft.  Holding home meds.  Hypokalemia Replenished  History of seizures Continue Keppra  Advanced dementia and failure to thrive. Nutrition consult.  Metastatic cancer to the lung Unknown primary.  Does not appear any work-up has been done.    Goals of care. Have been attempting to calling  her son multiple times and goes straight to his voicemail.  Have left messages several times without an answer.. Patient has guarded prognosis overall.  As per social work note and my discussion with Autumn Lewis PA at the SNF, goals of care were  discussed with patient's son in the past and he expressed her to receive full scope of treatment.  Given her significant functional decline the providers and the nursing homes staff have tried to reach out to the son again recently but have not been successful.  Her overall condition is guarded.  I have discussed with Mr Autumn Bryant that once patient is better hydrated and PICC line placed in she will be sent back attempt to have further goals of care discussion with the son and he agrees with the plan.     Family Communication  : Unable to reach son on the phone on multiple attempts   Consults  : None  Procedures  : None   Discharge Instructions   Allergies as of 11/22/2018      Reactions   Demerol [meperidine] Swelling      Medication List    STOP taking these medications   HYDROcodone-acetaminophen 5-325 MG tablet Commonly known as: Norco     TAKE these medications   acetaminophen 325 MG tablet Commonly known as: TYLENOL Take 650 mg by mouth every 8 (eight) hours. Routine   bimatoprost 0.01 % Soln Commonly known as: LUMIGAN Place 1 drop into both eyes at bedtime. For glaucoma   bisacodyl 10 MG suppository Commonly known as: DULCOLAX Place 10 mg rectally daily as needed (constipation not relieved by MOM).   Ensure Take 237 mLs by mouth 3 (three) times daily. Prefers chocolate if available   NUTRITIONAL SUPPLEMENT PO Take 1 each by mouth daily. Magic Cup with lunch   escitalopram 5 MG tablet Commonly known as: LEXAPRO Take 5 mg by mouth daily.   feeding supplement (PRO-STAT SUGAR FREE 64) Liqd Take 30 mLs by mouth 2 (two) times daily.   FLEET ENEMA RE Place 1 each rectally daily as needed (constipation not relieved by bisacodyl suppository).   hydrochlorothiazide 25 MG tablet Commonly known as: HYDRODIURIL Take 12.5 mg by mouth daily. Take 0.5 tablet to = 12.5 mg   levETIRAcetam 500 MG tablet Commonly known as: KEPPRA Take 500 mg by mouth 2 (two)  times daily.   meropenem 1 g in sodium chloride 0.9 % 100 mL Inject 1 g into the vein every 12 (twelve) hours  ( last dose after 10/20, total 5 doses)   MILK OF MAGNESIA PO Take 30 mLs by mouth daily as needed (if no BM in 3 days).   MULTIPLE VITAMINS-MINERALS PO Take 1 tablet by mouth daily.   potassium chloride SA 20 MEQ tablet Commonly known as: KLOR-CON Take 20 mEq by mouth daily.   sennosides-docusate sodium 8.6-50 MG tablet Commonly known as: SENOKOT-S Take 2 tablets by mouth 2 (two) times daily as needed for constipation.   sodium chloride 0.45 % solution Inject 75 mLs into the vein every hour.   Vitamin D (Ergocalciferol) 1.25 MG (50000 UT) Caps capsule Commonly known as: DRISDOL Take 50,000 Units by mouth every 7 (seven) days.      Follow-up Information    [facility] Preferred SNF Follow up.   Specialty: [place] information: [place] [phone]         Allergies  Allergen Reactions  . Demerol [Meperidine] Swelling  Procedures/Studies: Dg Chest 1 View  Result Date: 11/17/2018 CLINICAL DATA:  Decreased urine output, history of metastatic carcinoma to lung EXAM: CHEST  1 VIEW COMPARISON:  08/04/2018 FINDINGS: Innumerable bilateral pulmonary nodules consistent with metastatic disease, progressed since 08/04/2018 comparison. No pleural effusion. Normal heart size. Tortuous arch with atherosclerosis. Possible right hilar node. No pneumothorax. Chronic deformity of the right humerus. Post mastectomy changes on the right. IMPRESSION: Innumerable pulmonary nodules consistent with metastatic disease, progressed as compared with prior radiograph from June 2020. Possible right hilar adenopathy. Electronically Signed   By: Donavan Foil M.D.   On: 11/17/2018 20:19   Dg Knee 2 Views Left  Result Date: 11/17/2018 CLINICAL DATA:  Left thigh pain EXAM: LEFT KNEE - 1-2 VIEW  COMPARISON:  None FINDINGS: Bones appear osteopenic. No gross fracture or malalignment. Advanced tricompartment arthritis of the knee with probable trace effusion. Diffuse soft tissue swelling. Vascular calcifications. IMPRESSION: Osteopenia. Advanced tricompartment arthritis without gross evidence for acute fracture Electronically Signed   By: Donavan Foil M.D.   On: 11/17/2018 20:14   Dg Abd 1 View  Result Date: 11/17/2018 CLINICAL DATA:  Left greater than right hip pain EXAM: ABDOMEN - 1 VIEW COMPARISON:  10/18/2017 FINDINGS: Patient is rotated. Surgical clips in the right upper quadrant. Nonobstructed bowel-gas pattern with impacted feces at the rectum. Chronic fracture deformity of the proximal left femur. IMPRESSION: Nonobstructed bowel-gas pattern with impacted feces at the rectum Electronically Signed   By: Donavan Foil M.D.   On: 11/17/2018 20:12   Dg Hip Unilat With Pelvis 2-3 Views Left  Result Date: 11/17/2018 CLINICAL DATA:  Left greater than right hip pain EXAM: DG HIP (WITH OR WITHOUT PELVIS) 2-3V LEFT COMPARISON:  10/18/2017 FINDINGS: Limited by patient positioning. Pubic bones are obscured by rectal stool. Intramedullary rod within the right femur with chronic fracture deformity. Chronic fracture deformity at left femoral neck with progressed bony resorptive changes at the femoral neck and trochanter. Superior and lateral displacement of the residual distal femur with respect to the hip joint. Vascular calcifications. Smooth cortex thickening or periosteal reaction without change. IMPRESSION: 1. Chronic fracture deformity of the left femoral neck with progressed bony resorptive changes at the femoral neck and trochanter, now with superior and lateral displacement of the residual femoral shaft with respect to the hip joint. 2. Surgical hardware right hip with chronic fracture deformity Electronically Signed   By: Donavan Foil M.D.   On: 11/17/2018 20:16   [place] Ekg Site Rite  Result  Date: 11/21/2018 If Site Rite image not attached, placement could not be confirmed due to current cardiac rhythm.  [place] Ekg Site Rite  Result Date: 11/20/2018 If Site Rite image not attached, placement could not be confirmed due to current cardiac rhythm.      Subjective: Continues to be confused. Poor po intake  Discharge Exam: Vitals:   11/21/18 2135 11/22/18 0620  BP: (!) 149/87 130/72  Pulse: 88 98  Resp: 19 (!) 21  Temp: 97.8 F (36.6 C) 97.8 F (36.6 C)  SpO2: 100% 98%   Vitals:   11/21/18 1137 11/21/18 1328 11/21/18 2135 11/22/18 0620  BP: 109/78 104/70 (!) 149/87 130/72  Pulse: 92 88 88 98  Resp: 20 14 19  (!) 21  Temp: (!) 97.5 F (36.4 C) 97.9 F (36.6 C) 97.8 F (36.6 C) 97.8 F (36.6 C)  TempSrc: Oral Axillary Axillary Axillary  SpO2:  96% 100% 98%  Weight:      Height:  Elderly female  confused and poorly communicative HEENT: moist  mucosa, supple neck Chest: Clear b/l CVs: Normal S1-S2 GI: Soft, nondistended, nontender, Foley draining clear urine Musculoskeletal: Warm, no edema   The results of significant diagnostics from this hospitalization (including imaging, microbiology, ancillary and laboratory) are listed below for reference.     Microbiology: Recent Results (from the past 240 hour(s))  Culture, blood (routine x 2)     Status: None   Collection Time: 11/17/18  8:04 PM   Specimen: BLOOD  Result Value Ref Range Status   Specimen Description   Final    BLOOD LEFT ANTECUBITAL Performed at [facility] [address], [place], [place] [address]    Special Requests   Final    BOTTLES DRAWN AEROBIC AND ANAEROBIC Blood Culture adequate volume Performed at [facility] [address], [place], [place] [address]    Culture   Final    NO GROWTH 5 DAYS Performed at [facility], [place] [address], [place], [place] [address]    Report Status 11/22/2018 FINAL  Final  Culture, blood (routine x  2)     Status: None   Collection Time: 11/17/18  8:04 PM   Specimen: BLOOD  Result Value Ref Range Status   Specimen Description   Final    BLOOD BLOOD LEFT WRIST Performed at [facility] [address], [place], [place] [address]    Special Requests   Final    BOTTLES DRAWN AEROBIC AND ANAEROBIC Blood Culture adequate volume Performed at [facility] [address] [place], [place] [address]    Culture   Final    NO GROWTH 5 DAYS Performed at [facility], [place] [address], [place], [place] [address]    Report Status 11/22/2018 FINAL  Final  Urine culture     Status: Abnormal   Collection Time: 11/17/18  8:04 PM   Specimen: Urine, Random  Result Value Ref Range Status   Specimen Description   Final    URINE, RANDOM Performed at [facility] [address] [place], [place] [address]    Special Requests   Final    NONE Performed at [facility], [place] [address], [place],  [address]    Culture (A)  Final    >=100,000 COLONIES/mL ESCHERICHIA COLI Confirmed Extended Spectrum Beta-Lactamase Producer (ESBL).  In bloodstream infections from ESBL organisms, carbapenems are preferred over piperacillin/tazobactam. They are shown to have a lower risk of mortality.    Report Status 11/20/2018 FINAL  Final   Organism ID, Bacteria ESCHERICHIA COLI (A)  Final      Susceptibility   Escherichia coli - MIC*    AMPICILLIN >=32 RESISTANT Resistant     CEFAZOLIN >=64 RESISTANT Resistant     CEFTRIAXONE >=64 RESISTANT Resistant     CIPROFLOXACIN >=4 RESISTANT Resistant     GENTAMICIN <=1 SENSITIVE Sensitive     IMIPENEM <=0.25 SENSITIVE Sensitive     NITROFURANTOIN <=16 SENSITIVE Sensitive     TRIMETH/SULFA >=320 RESISTANT Resistant     AMPICILLIN/SULBACTAM 16 INTERMEDIATE Intermediate     PIP/TAZO <=4 SENSITIVE Sensitive     Extended ESBL POSITIVE Resistant     * >=100,000 COLONIES/mL ESCHERICHIA  COLI  SARS CORONAVIRUS 2 (TAT 6-24 HRS) Nasopharyngeal Nasopharyngeal Swab     Status: None   Collection Time: 11/17/18 11:29 PM   Specimen: Nasopharyngeal Swab  Result Value Ref Range Status  SARS Coronavirus 2 NEGATIVE NEGATIVE Final    Comment: (NOTE) SARS-CoV-2 target nucleic acids are NOT DETECTED. The SARS-CoV-2 RNA is generally detectable in upper and lower respiratory specimens during the acute phase of infection. Negative results do not preclude SARS-CoV-2 infection, do not rule out co-infections with other pathogens, and should not be used as the sole basis for treatment or other patient management decisions. Negative results must be combined with clinical observations, patient history, and epidemiological information. The expected result is Negative. Fact Sheet for Patients: SugarRoll.be Fact Sheet for Healthcare Providers: https://www.woods-mathews.com/ This test is not yet approved or cleared by the [place] FDA and  has been authorized for detection and/or diagnosis of SARS-CoV-2 by FDA under an Emergency Use Authorization (EUA). This EUA will remain  in effect (meaning this test can be used) for the duration of the COVID-19 declaration under Section 56 4(b)(1) of the Act, 21 U.S.C. section 360bbb-3(b)(1), unless the authorization is terminated or revoked sooner. Performed at [facility], [place] [address], [place], [place] [address]   SARS CORONAVIRUS 2 (TAT 6-24 HRS) Nasopharyngeal Nasopharyngeal Swab     Status: None   Collection Time: 11/20/18 11:26 AM   Specimen: Nasopharyngeal Swab  Result Value Ref Range Status   SARS Coronavirus 2 NEGATIVE NEGATIVE Final    Comment: (NOTE) SARS-CoV-2 target nucleic acids are NOT DETECTED. The SARS-CoV-2 RNA is generally detectable in upper and lower respiratory specimens during the acute phase of infection. Negative results do not preclude SARS-CoV-2 infection, do not rule  out co-infections with other pathogens, and should not be used as the sole basis for treatment or other patient management decisions. Negative results must be combined with clinical observations, patient history, and epidemiological information. The expected result is Negative. Fact Sheet for Patients: SugarRoll.be Fact Sheet for Healthcare Providers: https://www.woods-mathews.com/ This test is not yet approved or cleared by the [place] FDA and  has been authorized for detection and/or diagnosis of SARS-CoV-2 by FDA under an Emergency Use Authorization (EUA). This EUA will remain  in effect (meaning this test can be used) for the duration of the COVID-19 declaration under Section 56 4(b)(1) of the Act, 21 U.S.C. section 360bbb-3(b)(1), unless the authorization is terminated or revoked sooner. Performed at [facility], [place] [address], [place], [place] [address]      Labs: BNP (last 3 results) No results for input(s): BNP in the last 8760 hours. Basic Metabolic Panel: Recent Labs  Lab 11/18/18 0354 11/19/18 1002 11/20/18 0532 11/21/18 0548 11/22/18 0318  NA 144 147* 149* 147* 145  K 3.4* 4.0 4.6 3.5 3.2*  CL 114* 113* 117* 111 106  CO2 20* 19* 15* 24 29  GLUCOSE 95 97 75 146* 181*  BUN 61* 59* 54* 49* 45*  CREATININE 0.92 0.99 0.97 0.83 0.89  CALCIUM 7.3* 8.4* 8.5* 8.1* 7.7*   Liver Function Tests: Recent Labs  Lab 11/17/18 2004  AST 25  ALT 11  ALKPHOS 140*  BILITOT 0.8  PROT 6.3*  ALBUMIN 2.5*   Recent Labs  Lab 11/17/18 2004  LIPASE 17   No results for input(s): AMMONIA in the last 168 hours. CBC: Recent Labs  Lab 11/17/18 2004 11/18/18 0354 11/19/18 1002 11/20/18 0532 11/21/18 0017 11/21/18 0548  WBC 18.0* 16.1* 17.2* 16.7* 17.1* 17.5*  NEUTROABS 14.1*  --   --   --   --   --   HGB 13.3 10.9* 11.8* 11.5* 11.4* 11.6*  HCT 42.5 35.7* 40.1 39.8  38.8 38.5  MCV 93.8 93.9 96.4 98.3 97.5 93.9   PLT 369 314 339 341 353 286   Cardiac Enzymes: Recent Labs  Lab 11/18/18 0354  CKTOTAL 18*   BNP: Invalid input(s): POCBNP CBG: No results for input(s): GLUCAP in the last 168 hours. D-Dimer No results for input(s): DDIMER in the last 72 hours. Hgb A1c No results for input(s): HGBA1C in the last 72 hours. Lipid Profile No results for input(s): CHOL, HDL, LDLCALC, TRIG, CHOLHDL, LDLDIRECT in the last 72 hours. Thyroid function studies No results for input(s): TSH, T4TOTAL, T3FREE, THYROIDAB in the last 72 hours.  Invalid input(s): FREET3 Anemia work up No results for input(s): VITAMINB12, FOLATE, FERRITIN, TIBC, IRON, RETICCTPCT in the last 72 hours. Urinalysis    Component Value Date/Time   COLORURINE YELLOW 11/17/2018 2004   APPEARANCEUR CLOUDY (A) 11/17/2018 2004   LABSPEC 1.016 11/17/2018 2004   PHURINE 5.0 11/17/2018 2004   GLUCOSEU NEGATIVE 11/17/2018 2004   HGBUR LARGE (A) 11/17/2018 2004   BILIRUBINUR NEGATIVE 11/17/2018 2004   [place] 11/17/2018 2004   PROTEINUR NEGATIVE 11/17/2018 2004   NITRITE NEGATIVE 11/17/2018 2004   LEUKOCYTESUR LARGE (A) 11/17/2018 2004   Sepsis Labs Invalid input(s): PROCALCITONIN,  WBC,  LACTICIDVEN Microbiology Recent Results (from the past 240 hour(s))  Culture, blood (routine x 2)     Status: None   Collection Time: 11/17/18  8:04 PM   Specimen: BLOOD  Result Value Ref Range Status   Specimen Description   Final    BLOOD LEFT ANTECUBITAL Performed at [facility], [place] [address] [place], [place] [address]    Special Requests   Final    BOTTLES DRAWN AEROBIC AND ANAEROBIC Blood Culture adequate volume Performed at [facility] [address], [place], [place] [address]    Culture   Final    NO GROWTH 5 DAYS Performed at [facility], [place] [address], [place], [place] [address]    Report Status 11/22/2018 FINAL  Final  Culture, blood (routine x 2)     Status: None    Collection Time: 11/17/18  8:04 PM   Specimen: BLOOD  Result Value Ref Range Status   Specimen Description   Final    BLOOD BLOOD LEFT WRIST Performed at [facility] [address], [place], [place] [address]    Special Requests   Final    BOTTLES DRAWN AEROBIC AND ANAEROBIC Blood Culture adequate volume Performed at [facility] [address] [place], [place] [address]    Culture   Final    NO GROWTH 5 DAYS Performed at [facility], [place] [address] [place], [place] [address]    Report Status 11/22/2018 FINAL  Final  Urine culture     Status: Abnormal   Collection Time: 11/17/18  8:04 PM   Specimen: Urine, Random  Result Value Ref Range Status   Specimen Description   Final    URINE, RANDOM Performed at [facility] [address] [place], [place] [address]    Special Requests   Final    NONE Performed at [facility], [place] [address], [place], [place] [address]    Culture (A)  Final    >=100,000 COLONIES/mL ESCHERICHIA COLI Confirmed Extended Spectrum Beta-Lactamase Producer (ESBL).  In bloodstream infections from ESBL organisms, carbapenems are preferred over piperacillin/tazobactam. They are shown to have a lower risk of mortality.    Report Status 11/20/2018  FINAL  Final   Organism ID, Bacteria ESCHERICHIA COLI (A)  Final      Susceptibility   Escherichia coli - MIC*    AMPICILLIN >=32 RESISTANT Resistant     CEFAZOLIN >=64 RESISTANT Resistant     CEFTRIAXONE >=64 RESISTANT Resistant     CIPROFLOXACIN >=4 RESISTANT Resistant     GENTAMICIN <=1 SENSITIVE Sensitive     IMIPENEM <=0.25 SENSITIVE Sensitive     NITROFURANTOIN <=16 SENSITIVE Sensitive     TRIMETH/SULFA >=320 RESISTANT Resistant     AMPICILLIN/SULBACTAM 16 INTERMEDIATE Intermediate     PIP/TAZO <=4 SENSITIVE Sensitive     Extended ESBL POSITIVE Resistant     * >=100,000 COLONIES/mL ESCHERICHIA COLI  SARS  CORONAVIRUS 2 (TAT 6-24 HRS) Nasopharyngeal Nasopharyngeal Swab     Status: None   Collection Time: 11/17/18 11:29 PM   Specimen: Nasopharyngeal Swab  Result Value Ref Range Status   SARS Coronavirus 2 NEGATIVE NEGATIVE Final    Comment: (NOTE) SARS-CoV-2 target nucleic acids are NOT DETECTED. The SARS-CoV-2 RNA is generally detectable in upper and lower respiratory specimens during the acute phase of infection. Negative results do not preclude SARS-CoV-2 infection, do not rule out co-infections with other pathogens, and should not be used as the sole basis for treatment or other patient management decisions. Negative results must be combined with clinical observations, patient history, and epidemiological information. The expected result is Negative. Fact Sheet for Patients: SugarRoll.be Fact Sheet for Healthcare Providers: https://www.woods-mathews.com/ This test is not yet approved or cleared by the [place] FDA and  has been authorized for detection and/or diagnosis of SARS-CoV-2 by FDA under an Emergency Use Authorization (EUA). This EUA will remain  in effect (meaning this test can be used) for the duration of the COVID-19 declaration under Section 56 4(b)(1) of the Act, 21 U.S.C. section 360bbb-3(b)(1), unless the authorization is terminated or revoked sooner. Performed at [facility], [place] [name] [place], [place] [address]   SARS CORONAVIRUS 2 (TAT 6-24 HRS) Nasopharyngeal Nasopharyngeal Swab     Status: None   Collection Time: 11/20/18 11:26 AM   Specimen: Nasopharyngeal Swab  Result Value Ref Range Status   SARS Coronavirus 2 NEGATIVE NEGATIVE Final    Comment: (NOTE) SARS-CoV-2 target nucleic acids are NOT DETECTED. The SARS-CoV-2 RNA is generally detectable in upper and lower respiratory specimens during the acute phase of infection. Negative results do not preclude SARS-CoV-2 infection, do not rule  out co-infections with other pathogens, and should not be used as the sole basis for treatment or other patient management decisions. Negative results must be combined with clinical observations, patient history, and epidemiological information. The expected result is Negative. Fact Sheet for Patients: SugarRoll.be Fact Sheet for Healthcare Providers: https://www.woods-mathews.com/ This test is not yet approved or cleared by the [place] FDA and  has been authorized for detection and/or diagnosis of SARS-CoV-2 by FDA under an Emergency Use Authorization (EUA). This EUA will remain  in effect (meaning this test can be used) for the duration of the COVID-19 declaration under Section 56 4(b)(1) of the Act, 21 U.S.C. section 360bbb-3(b)(1), unless the authorization is terminated or revoked sooner. Performed at [facility], [place] [address], [place], [place] [address]      Time coordinating discharge: 35 minutes  SIGNED:   Louellen Molder, MD  [facility] 11/22/2018, 12:14 PM Pager   If 7PM-7AM, please contact night-coverage [email or web address] Password [email or web address]

## 2018-11-22 NOTE — Plan of Care (Signed)

## 2018-11-23 ENCOUNTER — Non-Acute Institutional Stay (SKILLED_NURSING_FACILITY): Payer: Medicare Other | Admitting: Internal Medicine

## 2018-11-23 ENCOUNTER — Encounter: Payer: Self-pay | Admitting: Internal Medicine

## 2018-11-23 ENCOUNTER — Other Ambulatory Visit: Payer: Self-pay | Admitting: Internal Medicine

## 2018-11-23 DIAGNOSIS — G9340 Encephalopathy, unspecified: Secondary | ICD-10-CM

## 2018-11-23 DIAGNOSIS — F339 Major depressive disorder, recurrent, unspecified: Secondary | ICD-10-CM

## 2018-11-23 DIAGNOSIS — A419 Sepsis, unspecified organism: Secondary | ICD-10-CM

## 2018-11-23 DIAGNOSIS — N390 Urinary tract infection, site not specified: Secondary | ICD-10-CM | POA: Diagnosis not present

## 2018-11-23 DIAGNOSIS — Z1612 Extended spectrum beta lactamase (ESBL) resistance: Secondary | ICD-10-CM

## 2018-11-23 DIAGNOSIS — N179 Acute kidney failure, unspecified: Secondary | ICD-10-CM | POA: Diagnosis not present

## 2018-11-23 DIAGNOSIS — B9629 Other Escherichia coli [E. coli] as the cause of diseases classified elsewhere: Secondary | ICD-10-CM

## 2018-11-23 DIAGNOSIS — F0280 Dementia in other diseases classified elsewhere without behavioral disturbance: Secondary | ICD-10-CM

## 2018-11-23 DIAGNOSIS — E870 Hyperosmolality and hypernatremia: Secondary | ICD-10-CM | POA: Diagnosis not present

## 2018-11-23 DIAGNOSIS — R6520 Severe sepsis without septic shock: Secondary | ICD-10-CM | POA: Diagnosis not present

## 2018-11-23 DIAGNOSIS — N170 Acute kidney failure with tubular necrosis: Secondary | ICD-10-CM

## 2018-11-23 DIAGNOSIS — I10 Essential (primary) hypertension: Secondary | ICD-10-CM

## 2018-11-23 DIAGNOSIS — G300 Alzheimer's disease with early onset: Secondary | ICD-10-CM | POA: Diagnosis not present

## 2018-11-23 DIAGNOSIS — C7800 Secondary malignant neoplasm of unspecified lung: Secondary | ICD-10-CM | POA: Diagnosis not present

## 2018-11-23 DIAGNOSIS — R569 Unspecified convulsions: Secondary | ICD-10-CM | POA: Diagnosis not present

## 2018-11-23 LAB — BASIC METABOLIC PANEL
BUN: 43 — AB (ref 4–21)
Creatinine: 0.9 (ref 0.5–1.1)
Glucose: 99
Potassium: 4.4 (ref 3.4–5.3)
Sodium: 147 (ref 137–147)

## 2018-11-23 LAB — CBC AND DIFFERENTIAL
HCT: 35 — AB (ref 36–46)
Hemoglobin: 11.4 — AB (ref 12.0–16.0)
Neutrophils Absolute: 15
Platelets: 258 (ref 150–399)
WBC: 18.6

## 2018-11-23 NOTE — Progress Notes (Signed)
: Provider:  [name]., MD Location:  [facility] Room Number: 850-Y Place of Service:  SNF [phone])  PCP: [name], MD [name], [name]  Extended Emergency Contact Information Primary Emergency Contact: [name] Address: [address]          [place], [place] [address] [name] of [place] Phone: [phone] Relation: Son Secondary Emergency Contact: [place], [place] of [place] Phone: [phone] Relation: Sister     Allergies: Demerol [meperidine]  Chief Complaint  Patient presents with   Readmit To SNF    Readmission to [facility]     HPI: Patient is an 83 y.o. female advanced dementia, failure to thrive, history of TBI with seizures, metastatic lung cancer of unknown primary, suspected to be uterine, and ESBL UTI with chronic indwelling Foley presented to [facility] long ED with poor p.o. intake and low urine output over the past prior days with no urine output from Foley on the day of arrival.  In the ED BUN was elevated at 59 but creatinine was 1.06.  WBC 15 K and a sodium of 143.  She was found to be mildly tachycardic with an elevated at lactic acid of 2.9 UA showed large leukocyte and many bacteria.  Patient was admitted to [facility] long hospital from 10/13-18 where she was treated for sepsis secondary to ESBL UTI, treated with empiric meropenem with PICC line placed for continued IV meropenem.  And during hospitalization patient had acute metabolic encephalopathy and acute kidney injury that resolved with IV fluids as well as some mild hyponatremia that resolved with IV fluids.  Patient is admitted back to SNF for residential care and the continuation of IV meropenem for total of 7 days.  While at skilled nursing facility for seizures treated with Keppra, hypertension treated with hydrochlorothiazide and and  depression treated with Lexapro.  Past Medical History:  Diagnosis Date   Anasarca 05/21/2016   Anxiety 11/03/2016   Closed TBI (traumatic brain injury) ([place]) 04/21/2016   Dysphagia 07/13/2016   Gout    Hypertension    Obesity (BMI 30.0-34.9) 05/21/2016   Status post insertion of percutaneous endoscopic gastrostomy (PEG) tube ([place]) 04/21/2016    Past Surgical History:  Procedure Laterality Date   CHOLECYSTECTOMY N/A 07/21/2017   Procedure: LAPAROSCOPIC CHOLECYSTECTOMY;  Surgeon: Erroll Luna, MD;  Location: [facility];  Service: General;  Laterality: N/A;   ERCP N/A 02/18/2018   Procedure: ENDOSCOPIC RETROGRADE CHOLANGIOPANCREATOGRAPHY (ERCP);  Surgeon: Milus Banister, MD;  Location: [facility];  Service: Endoscopy;  Laterality: N/A;   INTRAMEDULLARY (IM) NAIL INTERTROCHANTERIC Right 12/27/2016   Procedure: INTRAMEDULLARY (IM) NAIL INTERTROCHANTRIC RIGHT HIP;  Surgeon: Paralee Cancel, MD;  Location: [facility];  Service: Orthopedics;  Laterality: Right;   MASTECTOMY     REMOVAL OF STONES  02/18/2018   Procedure: REMOVAL OF STONES;  Surgeon: Milus Banister, MD;  Location: [facility];  Service: Endoscopy;;  balloon sweep   SPHINCTEROTOMY  02/18/2018   Procedure: Joan Mayans;  Surgeon: Milus Banister, MD;  Location: [facility];  Service: Endoscopy;;    Allergies as of 11/23/2018      Reactions   Demerol [meperidine] Swelling      Medication List       Accurate as of November 23, 2018 11:24 AM. If you have any questions, ask your nurse or  doctor.        acetaminophen 325 MG tablet Commonly known as: TYLENOL Take 650 mg by mouth every 8 (eight) hours. Routine   bimatoprost 0.01 % Soln Commonly known as: LUMIGAN Place 1 drop into both eyes at bedtime. For glaucoma   bisacodyl 10 MG suppository Commonly known as: DULCOLAX Place 10 mg rectally daily as needed (constipation not relieved by MOM).   Ensure Take 237 mLs by mouth 3 (three) times daily. Prefers  chocolate if available   NUTRITIONAL SUPPLEMENT PO Take 1 each by mouth daily. Magic Cup with lunch   JUVEN PO Take 1 Package by mouth 2 (two) times daily. Mix with 4-8 ounces of water   escitalopram 5 MG tablet Commonly known as: LEXAPRO Take 5 mg by mouth daily.   feeding supplement (PRO-STAT SUGAR FREE 64) Liqd Take 30 mLs by mouth 2 (two) times daily.   FLEET ENEMA RE Place 1 each rectally daily as needed (constipation not relieved by bisacodyl suppository).   hydrochlorothiazide 25 MG tablet Commonly known as: HYDRODIURIL Take 12.5 mg by mouth daily. Take 0.5 tablet to = 12.5 mg   HYDROcodone-acetaminophen 5-325 MG tablet Commonly known as: NORCO/VICODIN Take 1 tablet by mouth every 12 (twelve) hours.   levETIRAcetam 500 MG tablet Commonly known as: KEPPRA Take 500 mg by mouth 2 (two) times daily.   meropenem 1 g in sodium chloride 0.9 % 100 mL Inject 1 g into the vein every 12 (twelve) hours for 2 days.   MILK OF MAGNESIA PO Take 30 mLs by mouth daily as needed (if no BM in 3 days).   MULTIPLE VITAMINS-MINERALS PO Take 1 tablet by mouth daily.   potassium chloride SA 20 MEQ tablet Commonly known as: KLOR-CON Take 20 mEq by mouth daily.   sennosides-docusate sodium 8.6-50 MG tablet Commonly known as: SENOKOT-S Take 2 tablets by mouth 2 (two) times daily as needed for constipation.   sodium chloride 0.45 % solution Inject 75 mLs into the vein See admin instructions. 75 mL/hourly   Vitamin D (Ergocalciferol) 1.25 MG (50000 UT) Caps capsule Commonly known as: DRISDOL Take 50,000 Units by mouth every 7 (seven) days.       No orders of the defined types were placed in this encounter.   Immunization History  Administered Date(s) Administered   Influenza-Unspecified 12/19/2016, 11/26/2017, 11/11/2018   PPD Test 04/29/2016   Pneumococcal Conjugate-13 08/06/2016   Pneumococcal Polysaccharide-23 04/15/2016   Tdap 03/31/2016    Social History    Tobacco Use   Smoking status: Never Smoker   Smokeless tobacco: Never Used  Substance Use Topics   Alcohol use: Not Currently    Family history is   Family History  Problem Relation Age of Onset   Asthma Mother    Anesthesia problems Neg Hx    Arthritis Neg Hx    Cancer Neg Hx    Cerebral palsy Neg Hx    Clotting disorder Neg Hx    Club foot Neg Hx    Collagen disease Neg Hx    Deep vein thrombosis Neg Hx    Gait disorder Neg Hx    Heart disease Neg Hx    Gout Neg Hx    Diabetes Neg Hx    Hip dysplasia Neg Hx    Hip fracture Neg Hx    Hypermobility Neg Hx    Hypertension Neg Hx    Osteoporosis Neg Hx    Other Neg Hx    Stroke Neg Hx  Thyroid disease Neg Hx    Vasculitis Neg Hx    Spina bifida Neg Hx    Scoliosis Neg Hx    Rheumatologic disease Neg Hx    Pulmonary embolism Neg Hx       Review of Systems   unable to obtain secondary to dementia    Vitals:   11/23/18 1111  BP: 117/90  Pulse: 83  Resp: 20  Temp: (!) 97 F (36.1 C)    SpO2 Readings from Last 1 Encounters:  11/22/18 98%   Body mass index is 20.04 kg/m.     Physical Exam  GENERAL APPEARANCE: Alert, occasional speaking, no acute distress.  SKIN: No diaphoresis rash HEAD: Normocephalic, atraumatic  EYES: Conjunctiva/lids clear. Pupils round, reactive. EOMs intact.  EARS: External exam WNL, canals clear. Hearing grossly normal.  NOSE: No deformity or discharge.  MOUTH/THROAT: Lips w/o lesions  RESPIRATORY: Breathing is even, unlabored. Lung sounds are clear   CARDIOVASCULAR: Heart RRR no murmurs, rubs or gallops. No peripheral edema.   GASTROINTESTINAL: Abdomen is soft, non-tender, not distended w/ normal bowel sounds. GENITOURINARY: Bladder non tender, not distended  MUSCULOSKELETAL: Some contractures to lower extremities NEUROLOGIC:  Cranial nerves 2-12 grossly intact. Moves all extremities  PSYCHIATRIC: Mood and affect consistent with prior TBI and  dementia, no behavioral issues  Patient Active Problem List   Diagnosis Date Noted   UTI due to extended-spectrum beta lactamase (ESBL) producing Escherichia coli 11/22/2018   FTT (failure to thrive) in adult 11/22/2018   Sepsis ([place]) 11/22/2018   Dehydration 11/22/2018   UTI (urinary tract infection) 11/18/2018   Grand mal seizure ([place]) 08/16/2018   Elevated WBC count 08/08/2018   Abnormal chest x-ray 08/08/2018   Metastatic cancer to lung of unknown cell type ([place]) 08/08/2018   Hypernatremia 05/26/2018   HCAP (healthcare-associated pneumonia) 05/18/2018   Acute renal failure (ARF) ([place]) 05/07/2018   MRSA (methicillin resistant Staphylococcus aureus) septicemia ([place]) 02/23/2018   Hypokalemia 02/23/2018   Bile duct stone    E. coli sepsis ([place]) 02/17/2018   Cholecystitis 07/31/2017   Cholecystitis, acute with cholelithiasis 07/20/2017   Pressure injury of skin 07/20/2017   History of total right hip arthroplasty 01/05/2017   Acute blood loss as cause of postoperative anemia 01/05/2017   Escherichia coli urinary tract infection 01/05/2017   Acute encephalopathy 01/05/2017   Depression, recurrent ([place]) 01/05/2017   Polyneuropathy 01/05/2017   Closed right hip fracture ([place]) 12/25/2016   Anxiety 11/03/2016   Dysphagia 07/13/2016   Anasarca 05/21/2016   Obesity (BMI 30.0-34.9) 05/21/2016   Closed TBI (traumatic brain injury) ([place]) 04/21/2016   Status post insertion of percutaneous endoscopic gastrostomy (PEG) tube ([employer]) 04/21/2016   Facial laceration 04/21/2016   Cheek wound, right, subsequent encounter 04/21/2016   Urinary tract infection due to extended-spectrum beta lactamase (ESBL) producing Escherichia coli 04/21/2016   Hypotension 04/21/2016   Gout    Hypertension       Labs reviewed: Basic Metabolic Panel:    Component Value Date/Time   NA 145 11/22/2018 0318   NA 141 10/09/2018   NA 145 07/28/2017   K 3.2 (L) 11/22/2018  0318   K 3.7 07/28/2017   CL 106 11/22/2018 0318   CO2 29 11/22/2018 0318   GLUCOSE 181 (H) 11/22/2018 0318   BUN 45 (H) 11/22/2018 0318   BUN 16 10/09/2018   CREATININE 0.89 11/22/2018 0318   CREATININE 0.39 07/28/2017   CALCIUM 7.7 (L) 11/22/2018 0318   CALCIUM 8.1 07/28/2017   PROT  6.3 (L) 11/17/2018 2004   ALBUMIN 2.5 (L) 11/17/2018 2004   AST 25 11/17/2018 2004   ALT 11 11/17/2018 2004   ALKPHOS 140 (H) 11/17/2018 2004   BILITOT 0.8 11/17/2018 2004   GFRNONAA >60 11/22/2018 0318   GFRNONAA >90 07/28/2017   GFRAA >60 11/22/2018 0318    Recent Labs    02/16/18 2227  11/20/18 0532 11/21/18 0548 11/22/18 0318  NA  --    < > 149* 147* 145  K  --    < > 4.6 3.5 3.2*  CL  --    < > 117* 111 106  CO2  --    < > 15* 24 29  GLUCOSE  --    < > 75 146* 181*  BUN  --    < > 54* 49* 45*  CREATININE  --    < > 0.97 0.83 0.89  CALCIUM  --    < > 8.5* 8.1* 7.7*  MG 2.1  --   --   --   --    < > = values in this interval not displayed.   Liver Function Tests: Recent Labs    02/21/18 0325 08/04/18 1358 09/18/18 11/17/18 2004  AST 30 17 14 25   ALT 33 12 5* 11  ALKPHOS 324* 82 90 140*  BILITOT 1.1 0.7  --  0.8  PROT 4.8* 7.2  --  6.3*  ALBUMIN 1.9* 2.9*  --  2.5*   Recent Labs    02/17/18 0325 11/17/18 2004  LIPASE 21 17   Recent Labs    02/17/18 0435  AMMONIA 25   CBC: Recent Labs    09/18/18 10/09/18 11/17/18 2004  11/20/18 0532 11/21/18 0017 11/21/18 0548  WBC 8.8 13.9 18.0*   < > 16.7* 17.1* 17.5*  NEUTROABS 6 10 14.1*  --   --   --   --   HGB 12.9 10.8* 13.3   < > 11.5* 11.4* 11.6*  HCT 38 34* 42.5   < > 39.8 38.8 38.5  MCV  --   --  93.8   < > 98.3 97.5 93.9  PLT 268 181 369   < > 341 353 286   < > = values in this interval not displayed.   Lipid No results for input(s): CHOL, HDL, LDLCALC, TRIG in the last 8760 hours.  Cardiac Enzymes: Recent Labs    02/17/18 0325 11/18/18 0354  CKTOTAL  --  18*  TROPONINI 0.04*  --    BNP: No results  for input(s): BNP in the last 8760 hours. No results found for: MICROALBUR No results found for: HGBA1C Lab Results  Component Value Date   TSH 1.68 08/06/2013   No results found for: VITAMINB12 No results found for: FOLATE No results found for: IRON, TIBC, FERRITIN  Imaging and Procedures obtained prior to SNF admission: Dg Chest 1 View  Result Date: 11/17/2018 CLINICAL DATA:  Decreased urine output, history of metastatic carcinoma to lung EXAM: CHEST  1 VIEW COMPARISON:  08/04/2018 FINDINGS: Innumerable bilateral pulmonary nodules consistent with metastatic disease, progressed since 08/04/2018 comparison. No pleural effusion. Normal heart size. Tortuous arch with atherosclerosis. Possible right hilar node. No pneumothorax. Chronic deformity of the right humerus. Post mastectomy changes on the right. IMPRESSION: Innumerable pulmonary nodules consistent with metastatic disease, progressed as compared with prior radiograph from June 2020. Possible right hilar adenopathy. Electronically Signed   By: Donavan Foil M.D.   On: 11/17/2018 20:19   Dg Knee  2 Views Left  Result Date: 11/17/2018 CLINICAL DATA:  Left thigh pain EXAM: LEFT KNEE - 1-2 VIEW COMPARISON:  None FINDINGS: Bones appear osteopenic. No gross fracture or malalignment. Advanced tricompartment arthritis of the knee with probable trace effusion. Diffuse soft tissue swelling. Vascular calcifications. IMPRESSION: Osteopenia. Advanced tricompartment arthritis without gross evidence for acute fracture Electronically Signed   By: Donavan Foil M.D.   On: 11/17/2018 20:14   Dg Abd 1 View  Result Date: 11/17/2018 CLINICAL DATA:  Left greater than right hip pain EXAM: ABDOMEN - 1 VIEW COMPARISON:  10/18/2017 FINDINGS: Patient is rotated. Surgical clips in the right upper quadrant. Nonobstructed bowel-gas pattern with impacted feces at the rectum. Chronic fracture deformity of the proximal left femur. IMPRESSION: Nonobstructed bowel-gas  pattern with impacted feces at the rectum Electronically Signed   By: Donavan Foil M.D.   On: 11/17/2018 20:12   Dg Hip Unilat With Pelvis 2-3 Views Left  Result Date: 11/17/2018 CLINICAL DATA:  Left greater than right hip pain EXAM: DG HIP (WITH OR WITHOUT PELVIS) 2-3V LEFT COMPARISON:  10/18/2017 FINDINGS: Limited by patient positioning. Pubic bones are obscured by rectal stool. Intramedullary rod within the right femur with chronic fracture deformity. Chronic fracture deformity at left femoral neck with progressed bony resorptive changes at the femoral neck and trochanter. Superior and lateral displacement of the residual distal femur with respect to the hip joint. Vascular calcifications. Smooth cortex thickening or periosteal reaction without change. IMPRESSION: 1. Chronic fracture deformity of the left femoral neck with progressed bony resorptive changes at the femoral neck and trochanter, now with superior and lateral displacement of the residual femoral shaft with respect to the hip joint. 2. Surgical hardware right hip with chronic fracture deformity Electronically Signed   By: Donavan Foil M.D.   On: 11/17/2018 20:16     Not all labs, radiology exams or other studies done during hospitalization come through on my EPIC note; however they are reviewed by me.    Assessment and Plan  Sepsis/ESBL UTI-with history of prior ESBL and chronic indwelling Foley; empiric IV meropenem.  Attempted to give her a dose of oral fosfomycin but patient was unable to swallow liquid; therefore patient had PICC line placed and IV meropenem will continue until 10/20 to complete 7-day course SNF-admitted to continue IV meropenem until 10/20; continue supportive care  Dementia, advanced/acute metabolic encephalopathy SNF-patient basically at her baseline; continue supportive care; continue efforts to get in touch with son Aaron Edelman and obtain a DNR So comfort care can be given  AKI on CKD  3/hypernatremia/metabolic acidosis-improved with IV fluids with D5 and bicarb SNF-follow-up BMP  History of seizures SNF-continue Keppra 500 mg twice daily  Metastatic cancer to the lung SNF-primary unknown, suspected to be uterine as patient started having postmenopausal uterine bleeding as Covid was just getting revved up and doctor's visit was impossible; try to obtain contact with patient's son Aaron Edelman and get a DNR so that comfort care can be given  Hypertension SNF-controlled; continue hydrochlorothiazide 12.5 mg daily  Depression SNF-possibly helpful; continue Lexapro 5 mg daily    Time spent greater than 35 minutes;> 50% of time with patient was spent reviewing records, labs, tests and studies, counseling and developing plan of care  [name], MD

## 2018-11-26 ENCOUNTER — Non-Acute Institutional Stay (SKILLED_NURSING_FACILITY): Payer: Medicare Other | Admitting: Internal Medicine

## 2018-11-26 DIAGNOSIS — C7800 Secondary malignant neoplasm of unspecified lung: Secondary | ICD-10-CM

## 2018-11-26 DIAGNOSIS — Z7189 Other specified counseling: Secondary | ICD-10-CM

## 2018-11-26 NOTE — Progress Notes (Signed)
Location:  [facility] Room Number: 422-W Place of Service:  SNF (31)  [name], MD  [name], [name]  Extended Emergency Contact Information Primary Emergency Contact: [name] Address: [address]          [place],  [address] [name] of [place] Phone: [phone] Relation: Son Secondary Emergency Contact: [place], [place] of [place] Phone: [phone] Relation: Sister    Allergies: Demerol [meperidine]  Chief Complaint  Patient presents with  . Acute Visit    Conference with son    HPI: Patient is an 83 y.o. 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  She has had failure to thrive in addition to her dementia and traumatic brain injury.  She has been suffering and we have been unable to render comfort care because she has been a full code.  We have been trying to get in touch with her son, the entire time she was in the hospital hospital staff tried to get in touch with her son and they could not.  Today our director of nursing is on the phone with her son Aaron Edelman and I am going to speak to him about making his mother a DNR.  At this point he has been extremely adamant about her being a full code.  Past Medical History:  Diagnosis Date  . Anasarca 05/21/2016  . Anxiety 11/03/2016  . Closed TBI (traumatic brain injury) ([place]) 04/21/2016  . Dysphagia 07/13/2016  . Gout   . Hypertension   . Obesity (BMI 30.0-34.9) 05/21/2016  . Status post insertion of percutaneous endoscopic gastrostomy (PEG) tube ([place]) 04/21/2016    Past Surgical History:  Procedure Laterality Date  . CHOLECYSTECTOMY N/A 07/21/2017   Procedure: LAPAROSCOPIC CHOLECYSTECTOMY;  Surgeon: Erroll Luna, MD;  Location: [facility];  Service: General;  Laterality: N/A;  . ERCP N/A 02/18/2018   Procedure: ENDOSCOPIC RETROGRADE  CHOLANGIOPANCREATOGRAPHY (ERCP);  Surgeon: Milus Banister, MD;  Location: [facility];  Service: Endoscopy;  Laterality: N/A;  . INTRAMEDULLARY (IM) NAIL INTERTROCHANTERIC Right 12/27/2016   Procedure: INTRAMEDULLARY (IM) NAIL INTERTROCHANTRIC RIGHT HIP;  Surgeon: Paralee Cancel, MD;  Location: [facility];  Service: Orthopedics;  Laterality: Right;  . MASTECTOMY    . REMOVAL OF STONES  02/18/2018   Procedure: REMOVAL OF STONES;  Surgeon: Milus Banister, MD;  Location: [facility];  Service: Endoscopy;;  balloon sweep  . SPHINCTEROTOMY  02/18/2018   Procedure: SPHINCTEROTOMY;  Surgeon: Milus Banister, MD;  Location: [facility];  Service: Endoscopy;;    Allergies as of 11/26/2018      Reactions   Demerol [meperidine] Swelling      Medication List       Accurate as of November 26, 2018 11:59 PM. If you have any questions, ask your nurse or doctor.        acetaminophen 325 MG tablet Commonly known as: TYLENOL Take 650 mg by mouth every 8 (eight) hours. Routine   bimatoprost 0.01 % Soln Commonly known as: LUMIGAN Place 1 drop into both eyes at bedtime. For glaucoma   bisacodyl 10 MG suppository Commonly known as: DULCOLAX Place 10 mg rectally daily as needed (constipation not relieved by MOM).   Ensure Take 237 mLs by mouth 3 (three) times daily. Prefers chocolate if available   NUTRITIONAL SUPPLEMENT PO Take 1  each by mouth daily. Magic Cup with lunch   JUVEN PO Take 1 Package by mouth 2 (two) times daily. Mix with 4-8 ounces of water   escitalopram 5 MG tablet Commonly known as: LEXAPRO Take 5 mg by mouth daily.   feeding supplement (PRO-STAT SUGAR FREE 64) Liqd Take 30 mLs by mouth 2 (two) times daily.   FLEET ENEMA RE Place 1 each rectally daily as needed (constipation not relieved by bisacodyl suppository).   hydrochlorothiazide 25 MG tablet Commonly known as: HYDRODIURIL Take 12.5 mg by mouth daily. Take 0.5 tablet to = 12.5 mg   HYDROcodone-acetaminophen  5-325 MG tablet Commonly known as: NORCO/VICODIN Take 1 tablet by mouth every 12 (twelve) hours.   levETIRAcetam 500 MG tablet Commonly known as: KEPPRA Take 500 mg by mouth 2 (two) times daily.   MILK OF MAGNESIA PO Take 30 mLs by mouth daily as needed (if no BM in 3 days).   MULTIPLE VITAMINS-MINERALS PO Take 1 tablet by mouth daily.   potassium chloride SA 20 MEQ tablet Commonly known as: KLOR-CON Take 20 mEq by mouth daily.   sennosides-docusate sodium 8.6-50 MG tablet Commonly known as: SENOKOT-S Take 2 tablets by mouth 2 (two) times daily as needed for constipation.   sodium chloride 0.45 % solution Inject 75 mLs into the vein See admin instructions. 75 mL/hourly   Vitamin D (Ergocalciferol) 1.25 MG (50000 UT) Caps capsule Commonly known as: DRISDOL Take 50,000 Units by mouth every 7 (seven) days.       No orders of the defined types were placed in this encounter.   Immunization History  Administered Date(s) Administered  . Influenza-Unspecified 12/19/2016, 11/26/2017, 11/11/2018  . PPD Test 04/29/2016  . Pneumococcal Conjugate-13 08/06/2016  . Pneumococcal Polysaccharide-23 04/15/2016  . Tdap 03/31/2016    Social History   Tobacco Use  . Smoking status: Never Smoker  . Smokeless tobacco: Never Used  Substance Use Topics  . Alcohol use: Not Currently    Review of Systems   unable to obtain secondary to dementia, other than the fact that patient cries out in pain every time you touch; nursing-as per history present illness    Vitals:   11/26/18 1527  BP: 101/69  Pulse: 91  Resp: 17  Temp: (!) 97 F (36.1 C)   Body mass index is 23.43 kg/m. Physical Exam  GENERAL APPEARANCE: Alert, minimally conversant, appears uncomfortable; patient is receiving IV antibiotics SKIN: No diaphoresis rash HEENT: Unremarkable RESPIRATORY: Breathing is even, unlabored. Lung sounds are clear   CARDIOVASCULAR: Heart RRR no murmurs, rubs or gallops. No peripheral  edema  GASTROINTESTINAL: Abdomen is soft, non-tender, not distended w/ normal bowel sounds.  GENITOURINARY: Bladder non tender, not distended  MUSCULOSKELETAL: Wasting NEUROLOGIC: Cranial nerves 2-12 grossly intact. Moves all extremities PSYCHIATRIC: Dementia, no behavioral issues  Patient Active Problem List   Diagnosis Date Noted  . UTI due to extended-spectrum beta lactamase (ESBL) producing Escherichia coli 11/22/2018  . FTT (failure to thrive) in adult 11/22/2018  . Sepsis ([place]) 11/22/2018  . Dehydration 11/22/2018  . UTI (urinary tract infection) 11/18/2018  . Grand mal seizure ([place]) 08/16/2018  . Elevated WBC count 08/08/2018  . Abnormal chest x-ray 08/08/2018  . Metastatic cancer to lung of unknown cell type ([place]) 08/08/2018  . Hypernatremia 05/26/2018  . HCAP (healthcare-associated pneumonia) 05/18/2018  . Acute renal failure (ARF) ([place]) 05/07/2018  . MRSA (methicillin resistant Staphylococcus aureus) septicemia ([place]) 02/23/2018  . Hypokalemia 02/23/2018  . Bile duct stone   .  E. coli sepsis ([place]) 02/17/2018  . Cholecystitis 07/31/2017  . Cholecystitis, acute with cholelithiasis 07/20/2017  . Pressure injury of skin 07/20/2017  . History of total right hip arthroplasty 01/05/2017  . Acute blood loss as cause of postoperative anemia 01/05/2017  . Escherichia coli urinary tract infection 01/05/2017  . Acute encephalopathy 01/05/2017  . Depression, recurrent ([place]) 01/05/2017  . Polyneuropathy 01/05/2017  . Closed right hip fracture ([place]) 12/25/2016  . Anxiety 11/03/2016  . Dysphagia 07/13/2016  . Anasarca 05/21/2016  . Obesity (BMI 30.0-34.9) 05/21/2016  . Closed TBI (traumatic brain injury) ([place]) 04/21/2016  . Status post insertion of percutaneous endoscopic gastrostomy (PEG) tube ([place]) 04/21/2016  . Facial laceration 04/21/2016  . Cheek wound, right, subsequent encounter 04/21/2016  . Urinary tract infection due to extended-spectrum beta lactamase (ESBL) producing  Escherichia coli 04/21/2016  . Hypotension 04/21/2016  . Gout   . Hypertension     CMP     Component Value Date/Time   NA 147 11/23/2018   NA 145 07/28/2017   K 4.4 11/23/2018   K 3.7 07/28/2017   CL 106 11/22/2018 0318   CO2 29 11/22/2018 0318   GLUCOSE 181 (H) 11/22/2018 0318   BUN 43 (A) 11/23/2018   CREATININE 0.9 11/23/2018   CREATININE 0.89 11/22/2018 0318   CREATININE 0.39 07/28/2017   CALCIUM 7.7 (L) 11/22/2018 0318   CALCIUM 8.1 07/28/2017   PROT 6.3 (L) 11/17/2018 2004   ALBUMIN 2.5 (L) 11/17/2018 2004   AST 25 11/17/2018 2004   ALT 11 11/17/2018 2004   ALKPHOS 140 (H) 11/17/2018 2004   BILITOT 0.8 11/17/2018 2004   GFRNONAA >60 11/22/2018 0318   GFRNONAA >90 07/28/2017   GFRAA >60 11/22/2018 0318   Recent Labs    02/16/18 2227  11/20/18 0532 11/21/18 0548 11/22/18 0318 11/23/18  NA  --    < > 149* 147* 145 147  K  --    < > 4.6 3.5 3.2* 4.4  CL  --    < > 117* 111 106  --   CO2  --    < > 15* 24 29  --   GLUCOSE  --    < > 75 146* 181*  --   BUN  --    < > 54* 49* 45* 43*  CREATININE  --    < > 0.97 0.83 0.89 0.9  CALCIUM  --    < > 8.5* 8.1* 7.7*  --   MG 2.1  --   --   --   --   --    < > = values in this interval not displayed.   Recent Labs    02/21/18 0325 08/04/18 1358 09/18/18 11/17/18 2004  AST 30 17 14 25   ALT 33 12 5* 11  ALKPHOS 324* 82 90 140*  BILITOT 1.1 0.7  --  0.8  PROT 4.8* 7.2  --  6.3*  ALBUMIN 1.9* 2.9*  --  2.5*   Recent Labs    10/09/18 11/17/18 2004  11/20/18 0532 11/21/18 0017 11/21/18 0548 11/23/18  WBC 13.9 18.0*   < > 16.7* 17.1* 17.5* 18.6  NEUTROABS 10 14.1*  --   --   --   --  15  HGB 10.8* 13.3   < > 11.5* 11.4* 11.6* 11.4*  HCT 34* 42.5   < > 39.8 38.8 38.5 35*  MCV  --  93.8   < > 98.3 97.5 93.9  --   PLT 181 369   < >  341 353 286 258   < > = values in this interval not displayed.   No results for input(s): CHOL, LDLCALC, TRIG in the last 8760 hours.  Invalid input(s): HCL No results found for:  MICROALBUR Lab Results  Component Value Date   TSH 1.68 08/06/2013   No results found for: HGBA1C No results found for: CHOL, HDL, LDLCALC, LDLDIRECT, TRIG, CHOLHDL  Significant Diagnostic Results in last 30 days:  Dg Chest 1 View  Result Date: 11/17/2018 CLINICAL DATA:  Decreased urine output, history of metastatic carcinoma to lung EXAM: CHEST  1 VIEW COMPARISON:  08/04/2018 FINDINGS: Innumerable bilateral pulmonary nodules consistent with metastatic disease, progressed since 08/04/2018 comparison. No pleural effusion. Normal heart size. Tortuous arch with atherosclerosis. Possible right hilar node. No pneumothorax. Chronic deformity of the right humerus. Post mastectomy changes on the right. IMPRESSION: Innumerable pulmonary nodules consistent with metastatic disease, progressed as compared with prior radiograph from June 2020. Possible right hilar adenopathy. Electronically Signed   By: Donavan Foil M.D.   On: 11/17/2018 20:19   Dg Knee 2 Views Left  Result Date: 11/17/2018 CLINICAL DATA:  Left thigh pain EXAM: LEFT KNEE - 1-2 VIEW COMPARISON:  None FINDINGS: Bones appear osteopenic. No gross fracture or malalignment. Advanced tricompartment arthritis of the knee with probable trace effusion. Diffuse soft tissue swelling. Vascular calcifications. IMPRESSION: Osteopenia. Advanced tricompartment arthritis without gross evidence for acute fracture Electronically Signed   By: Donavan Foil M.D.   On: 11/17/2018 20:14   Dg Abd 1 View  Result Date: 11/17/2018 CLINICAL DATA:  Left greater than right hip pain EXAM: ABDOMEN - 1 VIEW COMPARISON:  10/18/2017 FINDINGS: Patient is rotated. Surgical clips in the right upper quadrant. Nonobstructed bowel-gas pattern with impacted feces at the rectum. Chronic fracture deformity of the proximal left femur. IMPRESSION: Nonobstructed bowel-gas pattern with impacted feces at the rectum Electronically Signed   By: Donavan Foil M.D.   On: 11/17/2018 20:12    Dg Hip Unilat With Pelvis 2-3 Views Left  Result Date: 11/17/2018 CLINICAL DATA:  Left greater than right hip pain EXAM: DG HIP (WITH OR WITHOUT PELVIS) 2-3V LEFT COMPARISON:  10/18/2017 FINDINGS: Limited by patient positioning. Pubic bones are obscured by rectal stool. Intramedullary rod within the right femur with chronic fracture deformity. Chronic fracture deformity at left femoral neck with progressed bony resorptive changes at the femoral neck and trochanter. Superior and lateral displacement of the residual distal femur with respect to the hip joint. Vascular calcifications. Smooth cortex thickening or periosteal reaction without change. IMPRESSION: 1. Chronic fracture deformity of the left femoral neck with progressed bony resorptive changes at the femoral neck and trochanter, now with superior and lateral displacement of the residual femoral shaft with respect to the hip joint. 2. Surgical hardware right hip with chronic fracture deformity Electronically Signed   By: Donavan Foil M.D.   On: 11/17/2018 20:16   [place] Ekg Site Rite  Result Date: 11/21/2018 If Site Rite image not attached, placement could not be confirmed due to current cardiac rhythm.  [place] Ekg Site Rite  Result Date: 11/20/2018 If Site Rite image not attached, placement could not be confirmed due to current cardiac rhythm.   Assessment and Plan  Stage IV cancer/encounter for obtaining DNR-I spoke with Aaron Edelman, patient's son for an hour.  He knew that she had metastatic cancer but he did not really understand that it is stage IV cancer and that she was dying.  I discussed everything possible to discuss  with about coding her doing CPR on her or doing it differently.  His father is dead and its only she and his mother, so it was hard for him but he finally said yes DNR.  I asked him 3 separate times to be sure and he was.  He said he was going to try to visit her, he could get a ride day after tomorrow.  He just had surgery on his  shoulder so he cannot drive.   Time spent discussing goals of care and DNR 60 minutes plus [name], MD

## 2018-11-27 ENCOUNTER — Encounter: Payer: Self-pay | Admitting: Internal Medicine

## 2018-11-27 DIAGNOSIS — F0390 Unspecified dementia without behavioral disturbance: Secondary | ICD-10-CM | POA: Insufficient documentation

## 2018-11-27 DIAGNOSIS — N390 Urinary tract infection, site not specified: Secondary | ICD-10-CM | POA: Diagnosis not present

## 2018-11-27 DIAGNOSIS — R1312 Dysphagia, oropharyngeal phase: Secondary | ICD-10-CM | POA: Diagnosis not present

## 2018-11-27 DIAGNOSIS — R569 Unspecified convulsions: Secondary | ICD-10-CM | POA: Insufficient documentation

## 2018-11-29 ENCOUNTER — Encounter: Payer: Self-pay | Admitting: Internal Medicine

## 2018-11-30 ENCOUNTER — Other Ambulatory Visit: Payer: Self-pay | Admitting: *Deleted

## 2018-11-30 DIAGNOSIS — N390 Urinary tract infection, site not specified: Secondary | ICD-10-CM | POA: Diagnosis not present

## 2018-11-30 DIAGNOSIS — R1312 Dysphagia, oropharyngeal phase: Secondary | ICD-10-CM | POA: Diagnosis not present

## 2018-11-30 NOTE — Patient Outreach (Signed)
Member assessed for potential [facility] Care Management needs as a benefit of [place] [employer].  Verified in Patient [place] that member was receiving therapy at [facility].  Per chart records, member lived at [facility] as long term resident. Appears disposition plan is to return to long term care at facility.  Marthenia Rolling, MSN-Ed, RN,BSN [facility] Acute Care Coordinator [phone] [facility]) [phone]  (Toll free office)

## 2018-12-01 ENCOUNTER — Non-Acute Institutional Stay (SKILLED_NURSING_FACILITY): Payer: Medicare Other | Admitting: Internal Medicine

## 2018-12-01 DIAGNOSIS — F0280 Dementia in other diseases classified elsewhere without behavioral disturbance: Secondary | ICD-10-CM

## 2018-12-01 DIAGNOSIS — C7801 Secondary malignant neoplasm of right lung: Secondary | ICD-10-CM

## 2018-12-01 DIAGNOSIS — S069X6D Unspecified intracranial injury with loss of consciousness greater than 24 hours without return to pre-existing conscious level with patient surviving, subsequent encounter: Secondary | ICD-10-CM

## 2018-12-01 DIAGNOSIS — N390 Urinary tract infection, site not specified: Secondary | ICD-10-CM | POA: Diagnosis not present

## 2018-12-01 DIAGNOSIS — G300 Alzheimer's disease with early onset: Secondary | ICD-10-CM | POA: Diagnosis not present

## 2018-12-01 DIAGNOSIS — R627 Adult failure to thrive: Secondary | ICD-10-CM

## 2018-12-01 DIAGNOSIS — R601 Generalized edema: Secondary | ICD-10-CM | POA: Diagnosis not present

## 2018-12-01 DIAGNOSIS — R1312 Dysphagia, oropharyngeal phase: Secondary | ICD-10-CM | POA: Diagnosis not present

## 2018-12-02 ENCOUNTER — Other Ambulatory Visit: Payer: Self-pay | Admitting: Internal Medicine

## 2018-12-02 ENCOUNTER — Other Ambulatory Visit: Payer: Self-pay | Admitting: *Deleted

## 2018-12-02 DIAGNOSIS — N390 Urinary tract infection, site not specified: Secondary | ICD-10-CM | POA: Diagnosis not present

## 2018-12-02 DIAGNOSIS — R1312 Dysphagia, oropharyngeal phase: Secondary | ICD-10-CM | POA: Diagnosis not present

## 2018-12-02 MED ORDER — HYDROCODONE-ACETAMINOPHEN 5-325 MG PO TABS: 1.0000 | ORAL_TABLET | Freq: Three times a day (TID) | ORAL | 0 refills | Status: AC

## 2018-12-02 NOTE — Patient Outreach (Signed)
Member assessed for potential [facility] Care Management needs as a benefit of [place] [employer].  Confirmed with [facility] planner that Mrs. Shetley is a long term resident of [facility]. The disposition plan is to return to long term care.  Marthenia Rolling, MSN-Ed, RN,BSN [facility] Acute Care Coordinator [phone] [place]) [phone]  (Toll free office)

## 2018-12-06 ENCOUNTER — Encounter: Payer: Self-pay | Admitting: Internal Medicine

## 2018-12-06 DIAGNOSIS — C799 Secondary malignant neoplasm of unspecified site: Secondary | ICD-10-CM | POA: Insufficient documentation

## 2018-12-06 NOTE — Progress Notes (Signed)
Location:   [facility]:   SNF  [name], Noah Delaine, MD  [name], [name]  Extended Emergency Contact Information Primary Emergency Contact: [name] Address: [address]          [place], [place] [address] [name] of [place] Phone: [phone] Relation: Son Secondary Emergency Contact: [place], [place] of [place] Phone: [phone] Relation: Sister    Allergies: Demerol [meperidine]  Chief Complaint  Patient presents with  . Acute Visit    HPI: Patient is 83 y.o. female who who is being seen because nursing has noticed that she has developed swelling all over her body.  Patient has been getting IV fluids secondary to poor urine output 100 to 150 cc per shift.  Patient has been failing to thrive and eating and drinking very little.  She is also appearing to require more pain medication because she cries out in pain when touched or moved but she is still swallowing pills  Past Medical History:  Diagnosis Date  . Anasarca 05/21/2016  . Anxiety 11/03/2016  . Closed TBI (traumatic brain injury) ([place]) 04/21/2016  . Dysphagia 07/13/2016  . Gout   . Hypertension   . Obesity (BMI 30.0-34.9) 05/21/2016  . Status post insertion of percutaneous endoscopic gastrostomy (PEG) tube ([place]) 04/21/2016    Past Surgical History:  Procedure Laterality Date  . CHOLECYSTECTOMY N/A 07/21/2017   Procedure: LAPAROSCOPIC CHOLECYSTECTOMY;  Surgeon: Erroll Luna, MD;  Location: [facility];  Service: General;  Laterality: N/A;  . ERCP N/A 02/18/2018   Procedure: ENDOSCOPIC RETROGRADE CHOLANGIOPANCREATOGRAPHY (ERCP);  Surgeon: Milus Banister, MD;  Location: [facility];  Service: Endoscopy;  Laterality: N/A;  . INTRAMEDULLARY (IM) NAIL INTERTROCHANTERIC Right 12/27/2016   Procedure: INTRAMEDULLARY (IM) NAIL INTERTROCHANTRIC RIGHT HIP;  Surgeon: Paralee Cancel, MD;   Location: [facility];  Service: Orthopedics;  Laterality: Right;  . MASTECTOMY    . REMOVAL OF STONES  02/18/2018   Procedure: REMOVAL OF STONES;  Surgeon: Milus Banister, MD;  Location: [facility];  Service: Endoscopy;;  balloon sweep  . SPHINCTEROTOMY  02/18/2018   Procedure: SPHINCTEROTOMY;  Surgeon: Milus Banister, MD;  Location: [facility];  Service: Endoscopy;;    Allergies as of 12/01/2018      Reactions   Demerol [meperidine] Swelling      Medication List       Accurate as of December 01, 2018 11:59 PM. If you have any questions, ask your nurse or doctor.        acetaminophen 325 MG tablet Commonly known as: TYLENOL Take 650 mg by mouth every 8 (eight) hours. Routine   bimatoprost 0.01 % Soln Commonly known as: LUMIGAN Place 1 drop into both eyes at bedtime. For glaucoma   bisacodyl 10 MG suppository Commonly known as: DULCOLAX Place 10 mg rectally daily as needed (constipation not relieved by MOM).   Ensure Take 237 mLs by mouth 3 (three) times daily. Prefers chocolate if available   NUTRITIONAL SUPPLEMENT PO Take 1 each by mouth daily. Magic Cup with lunch   JUVEN PO Take 1 Package by mouth 2 (two) times daily. Mix with 4-8 ounces of water   escitalopram 5 MG tablet Commonly known as: LEXAPRO Take 5 mg by mouth daily.   feeding supplement (PRO-STAT SUGAR FREE 64) Liqd Take 30 mLs by mouth 2 (two) times daily.  FLEET ENEMA RE Place 1 each rectally daily as needed (constipation not relieved by bisacodyl suppository).   hydrochlorothiazide 25 MG tablet Commonly known as: HYDRODIURIL Take 12.5 mg by mouth daily. Take 0.5 tablet to = 12.5 mg   HYDROcodone-acetaminophen 5-325 MG tablet Commonly known as: NORCO/VICODIN Take 1 tablet by mouth every 12 (twelve) hours.   levETIRAcetam 500 MG tablet Commonly known as: KEPPRA Take 500 mg by mouth 2 (two) times daily.   MILK OF MAGNESIA PO Take 30 mLs by mouth daily as needed (if no BM in 3 days).    MULTIPLE VITAMINS-MINERALS PO Take 1 tablet by mouth daily.   potassium chloride SA 20 MEQ tablet Commonly known as: KLOR-CON Take 20 mEq by mouth daily.   sennosides-docusate sodium 8.6-50 MG tablet Commonly known as: SENOKOT-S Take 2 tablets by mouth 2 (two) times daily as needed for constipation.   sodium chloride 0.45 % solution Inject 75 mLs into the vein See admin instructions. 75 mL/hourly   Vitamin D (Ergocalciferol) 1.25 MG (50000 UT) Caps capsule Commonly known as: DRISDOL Take 50,000 Units by mouth every 7 (seven) days.       No orders of the defined types were placed in this encounter.   Immunization History  Administered Date(s) Administered  . Influenza-Unspecified 12/19/2016, 11/26/2017, 11/11/2018  . PPD Test 04/29/2016  . Pneumococcal Conjugate-13 08/06/2016  . Pneumococcal Polysaccharide-23 04/15/2016  . Tdap 03/31/2016    Social History   Tobacco Use  . Smoking status: Never Smoker  . Smokeless tobacco: Never Used  Substance Use Topics  . Alcohol use: Not Currently    Review of Systems    unable obtain secondary to patient condition; nursing concern for swelling over body    Vitals:   12/06/18 2314  BP: 126/70  Pulse: 78  Resp: 18  Temp: 98 F (36.7 C)   Body mass index is 23.43 kg/m. Physical Exam  GENERAL APPEARANCE: Alert on and off, at times appears very comfortable SKIN: No diaphoresis rash HEENT: Unremarkable RESPIRATORY: Breathing is even, unlabored. Lung sounds are clear   CARDIOVASCULAR: Heart RRR no murmurs, rubs or gallops.  Upper extremity edema trace to 1+; lower extremity edema 2+ over lower trunk trace to 1+ GASTROINTESTINAL: Abdomen is soft, non-tender, not distended w/ normal bowel sounds.  GENITOURINARY: Bladder non tender, not distended  MUSCULOSKELETAL: Some contractures NEUROLOGIC: Cranial nerves 2-12 grossly intact. Moves all extremities PSYCHIATRIC: Dementia, no behavioral issues  Patient Active Problem  List   Diagnosis Date Noted  . Dementia ([place]) 11/27/2018  . Seizures ([place]) 11/27/2018  . UTI due to extended-spectrum beta lactamase (ESBL) producing Escherichia coli 11/22/2018  . FTT (failure to thrive) in adult 11/22/2018  . Sepsis ([place]) 11/22/2018  . Dehydration 11/22/2018  . UTI (urinary tract infection) 11/18/2018  . Grand mal seizure ([place]) 08/16/2018  . Elevated WBC count 08/08/2018  . Abnormal chest x-ray 08/08/2018  . Metastatic cancer to lung of unknown cell type ([place]) 08/08/2018  . Hypernatremia 05/26/2018  . HCAP (healthcare-associated pneumonia) 05/18/2018  . Acute renal failure (ARF) ([place]) 05/07/2018  . MRSA (methicillin resistant Staphylococcus aureus) septicemia ([place]) 02/23/2018  . Hypokalemia 02/23/2018  . Bile duct stone   . E. coli sepsis ([place]) 02/17/2018  . Cholecystitis 07/31/2017  . Cholecystitis, acute with cholelithiasis 07/20/2017  . Pressure injury of skin 07/20/2017  . History of total right hip arthroplasty 01/05/2017  . Acute blood loss as cause of postoperative anemia 01/05/2017  . Escherichia coli urinary tract infection 01/05/2017  .  Acute encephalopathy 01/05/2017  . Depression, recurrent ([place]) 01/05/2017  . Polyneuropathy 01/05/2017  . Closed right hip fracture ([place]) 12/25/2016  . Anxiety 11/03/2016  . Dysphagia 07/13/2016  . Anasarca 05/21/2016  . Obesity (BMI 30.0-34.9) 05/21/2016  . Closed TBI (traumatic brain injury) ([place]) 04/21/2016  . Status post insertion of percutaneous endoscopic gastrostomy (PEG) tube ([place]) 04/21/2016  . Facial laceration 04/21/2016  . Cheek wound, right, subsequent encounter 04/21/2016  . Urinary tract infection due to extended-spectrum beta lactamase (ESBL) producing Escherichia coli 04/21/2016  . Hypotension 04/21/2016  . Gout   . Hypertension     CMP     Component Value Date/Time   NA 147 11/23/2018   NA 145 07/28/2017   K 4.4 11/23/2018   K 3.7 07/28/2017   CL 106 11/22/2018 0318   CO2 29 11/22/2018  0318   GLUCOSE 181 (H) 11/22/2018 0318   BUN 43 (A) 11/23/2018   CREATININE 0.9 11/23/2018   CREATININE 0.89 11/22/2018 0318   CREATININE 0.39 07/28/2017   CALCIUM 7.7 (L) 11/22/2018 0318   CALCIUM 8.1 07/28/2017   PROT 6.3 (L) 11/17/2018 2004   ALBUMIN 2.5 (L) 11/17/2018 2004   AST 25 11/17/2018 2004   ALT 11 11/17/2018 2004   ALKPHOS 140 (H) 11/17/2018 2004   BILITOT 0.8 11/17/2018 2004   GFRNONAA >60 11/22/2018 0318   GFRNONAA >90 07/28/2017   GFRAA >60 11/22/2018 0318   Recent Labs    02/16/18 2227  11/20/18 0532 11/21/18 0548 11/22/18 0318 11/23/18  NA  --    < > 149* 147* 145 147  K  --    < > 4.6 3.5 3.2* 4.4  CL  --    < > 117* 111 106  --   CO2  --    < > 15* 24 29  --   GLUCOSE  --    < > 75 146* 181*  --   BUN  --    < > 54* 49* 45* 43*  CREATININE  --    < > 0.97 0.83 0.89 0.9  CALCIUM  --    < > 8.5* 8.1* 7.7*  --   MG 2.1  --   --   --   --   --    < > = values in this interval not displayed.   Recent Labs    02/21/18 0325 08/04/18 1358 09/18/18 11/17/18 2004  AST 30 17 14 25   ALT 33 12 5* 11  ALKPHOS 324* 82 90 140*  BILITOT 1.1 0.7  --  0.8  PROT 4.8* 7.2  --  6.3*  ALBUMIN 1.9* 2.9*  --  2.5*   Recent Labs    11/13/18 11/17/18 2004  11/20/18 0532 11/21/18 0017 11/21/18 0548 11/23/18  WBC 15.2  18.7 18.0*   < > 16.7* 17.1* 17.5* 18.6  NEUTROABS 12  15 14.1*  --   --   --   --  15  HGB 12.0  12.9 13.3   < > 11.5* 11.4* 11.6* 11.4*  HCT 37  40 42.5   < > 39.8 38.8 38.5 35*  MCV  --  93.8   < > 98.3 97.5 93.9  --   PLT 305  372 369   < > 341 353 286 258   < > = values in this interval not displayed.   No results for input(s): CHOL, LDLCALC, TRIG in the last 8760 hours.  Invalid input(s): HCL No results found for: [facility]  Results  Component Value Date   TSH 1.68 08/06/2013   No results found for: HGBA1C No results found for: CHOL, HDL, LDLCALC, LDLDIRECT, TRIG, CHOLHDL  Significant Diagnostic Results in last 30 days:  Dg  Chest 1 View  Result Date: 11/17/2018 CLINICAL DATA:  Decreased urine output, history of metastatic carcinoma to lung EXAM: CHEST  1 VIEW COMPARISON:  08/04/2018 FINDINGS: Innumerable bilateral pulmonary nodules consistent with metastatic disease, progressed since 08/04/2018 comparison. No pleural effusion. Normal heart size. Tortuous arch with atherosclerosis. Possible right hilar node. No pneumothorax. Chronic deformity of the right humerus. Post mastectomy changes on the right. IMPRESSION: Innumerable pulmonary nodules consistent with metastatic disease, progressed as compared with prior radiograph from June 2020. Possible right hilar adenopathy. Electronically Signed   By: Donavan Foil M.D.   On: 11/17/2018 20:19   Dg Knee 2 Views Left  Result Date: 11/17/2018 CLINICAL DATA:  Left thigh pain EXAM: LEFT KNEE - 1-2 VIEW COMPARISON:  None FINDINGS: Bones appear osteopenic. No gross fracture or malalignment. Advanced tricompartment arthritis of the knee with probable trace effusion. Diffuse soft tissue swelling. Vascular calcifications. IMPRESSION: Osteopenia. Advanced tricompartment arthritis without gross evidence for acute fracture Electronically Signed   By: Donavan Foil M.D.   On: 11/17/2018 20:14   Dg Abd 1 View  Result Date: 11/17/2018 CLINICAL DATA:  Left greater than right hip pain EXAM: ABDOMEN - 1 VIEW COMPARISON:  10/18/2017 FINDINGS: Patient is rotated. Surgical clips in the right upper quadrant. Nonobstructed bowel-gas pattern with impacted feces at the rectum. Chronic fracture deformity of the proximal left femur. IMPRESSION: Nonobstructed bowel-gas pattern with impacted feces at the rectum Electronically Signed   By: Donavan Foil M.D.   On: 11/17/2018 20:12   Dg Hip Unilat With Pelvis 2-3 Views Left  Result Date: 11/17/2018 CLINICAL DATA:  Left greater than right hip pain EXAM: DG HIP (WITH OR WITHOUT PELVIS) 2-3V LEFT COMPARISON:  10/18/2017 FINDINGS: Limited by patient  positioning. Pubic bones are obscured by rectal stool. Intramedullary rod within the right femur with chronic fracture deformity. Chronic fracture deformity at left femoral neck with progressed bony resorptive changes at the femoral neck and trochanter. Superior and lateral displacement of the residual distal femur with respect to the hip joint. Vascular calcifications. Smooth cortex thickening or periosteal reaction without change. IMPRESSION: 1. Chronic fracture deformity of the left femoral neck with progressed bony resorptive changes at the femoral neck and trochanter, now with superior and lateral displacement of the residual femoral shaft with respect to the hip joint. 2. Surgical hardware right hip with chronic fracture deformity Electronically Signed   By: Donavan Foil M.D.   On: 11/17/2018 20:16   [place] Ekg Site Rite  Result Date: 11/21/2018 If Site Rite image not attached, placement could not be confirmed due to current cardiac rhythm.  [place] Ekg Site Rite  Result Date: 11/20/2018 If Site Rite image not attached, placement could not be confirmed due to current cardiac rhythm.   Assessment and Plan  Anasarca-secondary to third spacing.  Will need to DC IV fluids.  We are at the point where because of poor nutrition because of failure to thrive and poor p.o. intake we can no longer try to save her renal function with IV fluids.  We are approaching the end of the road  Prior TBI/dementia/stage IV cancer/failure to thrive -altogether patient has been failing and is failing; fortunately recently patient was made a DNR so we can move toward comfort care  [name], MD

## 2018-12-07 DIAGNOSIS — L89153 Pressure ulcer of sacral region, stage 3: Secondary | ICD-10-CM | POA: Diagnosis not present

## 2018-12-08 ENCOUNTER — Non-Acute Institutional Stay (SKILLED_NURSING_FACILITY): Payer: Medicare Other | Admitting: Internal Medicine

## 2018-12-08 ENCOUNTER — Other Ambulatory Visit: Payer: Self-pay | Admitting: Internal Medicine

## 2018-12-08 ENCOUNTER — Encounter: Payer: Self-pay | Admitting: Internal Medicine

## 2018-12-08 DIAGNOSIS — R627 Adult failure to thrive: Secondary | ICD-10-CM

## 2018-12-08 DIAGNOSIS — S069X6D Unspecified intracranial injury with loss of consciousness greater than 24 hours without return to pre-existing conscious level with patient surviving, subsequent encounter: Secondary | ICD-10-CM

## 2018-12-08 DIAGNOSIS — C7800 Secondary malignant neoplasm of unspecified lung: Secondary | ICD-10-CM | POA: Diagnosis not present

## 2018-12-08 DIAGNOSIS — Z515 Encounter for palliative care: Secondary | ICD-10-CM | POA: Diagnosis not present

## 2018-12-08 MED ORDER — MORPHINE SULFATE (CONCENTRATE) 10 MG /0.5 ML PO SOLN: 5.0000 mg | ORAL | 0 refills | Status: DC | PRN

## 2018-12-08 NOTE — Progress Notes (Signed)
Location:  [facility] Room Number: 203-W Place of Service:  SNF (31)  Autumn Duos, MD  Patient Care Team: Autumn Duos, [name]  Extended Emergency Contact Information Primary Emergency Contact: Autumn Bryant Address: [address]          [place], [place] [address] Autumn Bryant of [place] Phone: [phone] Relation: Son Secondary Emergency Contact: Autumn Bryant of [place] Phone: [phone] Relation: Sister    Allergies: Demerol [meperidine]  Chief Complaint  Patient presents with  . Acute Visit    Patient is seen for uncontrolled pain.     HPI: Patient is an 83 y.o. female who is in who can no longer swallow and now needs to have morphine.  In addition patient son has asked for morphine as well.  Past Medical History:  Diagnosis Date  . Anasarca 05/21/2016  . Anxiety 11/03/2016  . Closed TBI (traumatic brain injury) ([place]) 04/21/2016  . Dysphagia 07/13/2016  . Gout   . Hypertension   . Obesity (BMI 30.0-34.9) 05/21/2016  . Status post insertion of percutaneous endoscopic gastrostomy (PEG) tube ([place]) 04/21/2016    Past Surgical History:  Procedure Laterality Date  . CHOLECYSTECTOMY N/A 07/21/2017   Procedure: LAPAROSCOPIC CHOLECYSTECTOMY;  Surgeon: Erroll Luna, MD;  Location: [facility];  Service: General;  Laterality: N/A;  . ERCP N/A 02/18/2018   Procedure: ENDOSCOPIC RETROGRADE CHOLANGIOPANCREATOGRAPHY (ERCP);  Surgeon: Milus Banister, MD;  Location: [facility];  Service: Endoscopy;  Laterality: N/A;  . INTRAMEDULLARY (IM) NAIL INTERTROCHANTERIC Right 12/27/2016   Procedure: INTRAMEDULLARY (IM) NAIL INTERTROCHANTRIC RIGHT HIP;  Surgeon: Paralee Cancel, MD;  Location: [facility];  Service: Orthopedics;  Laterality: Right;  . MASTECTOMY    . REMOVAL OF STONES  02/18/2018   Procedure: REMOVAL OF STONES;  Surgeon: Milus Banister, MD;  Location:  [facility];  Service: Endoscopy;;  balloon sweep  . SPHINCTEROTOMY  02/18/2018   Procedure: SPHINCTEROTOMY;  Surgeon: Milus Banister, MD;  Location: [facility];  Service: Endoscopy;;    Allergies as of 12/08/2018      Reactions   Demerol [meperidine] Swelling      Medication List       Accurate as of December 08, 2018  3:40 PM. If you have any questions, ask your nurse or doctor.        acetaminophen 325 MG tablet Commonly known as: TYLENOL Take 650 mg by mouth every 8 (eight) hours. Routine   bimatoprost 0.01 % Soln Commonly known as: LUMIGAN Place 1 drop into both eyes at bedtime. For glaucoma   bisacodyl 10 MG suppository Commonly known as: DULCOLAX Place 10 mg rectally daily as needed (constipation not relieved by MOM).   Ensure Take 237 mLs by mouth 3 (three) times daily. Prefers chocolate if available   NUTRITIONAL SUPPLEMENT PO Take 1 each by mouth daily. Magic Cup with lunch   JUVEN PO Take 1 Package by mouth 2 (two) times daily. Mix with 4-8 ounces of water   escitalopram 5 MG tablet Commonly known as: LEXAPRO Take 5 mg by mouth daily.   feeding supplement (PRO-STAT SUGAR FREE 64) Liqd Take 30 mLs by mouth 2 (two) times daily.   FLEET ENEMA RE Place 1 each rectally daily as needed (constipation not relieved by bisacodyl suppository).   hydrochlorothiazide 25 MG tablet Commonly known as: HYDRODIURIL Take 12.5 mg by mouth daily.  Take 0.5 tablet to = 12.5 mg   HYDROcodone-acetaminophen 5-325 MG tablet Commonly known as: NORCO/VICODIN Take 1 tablet by mouth 3 (three) times daily.   levETIRAcetam 500 MG tablet Commonly known as: KEPPRA Take 500 mg by mouth 2 (two) times daily.   MILK OF MAGNESIA PO Take 30 mLs by mouth daily as needed (if no BM in 3 days).   morphine CONCENTRATE 10 mg / 0.5 ml concentrated solution Take 0.25 mLs (5 mg total) by mouth every 4 (four) hours as needed for severe pain. Started by: Inocencio Homes, MD   MULTIPLE  VITAMINS-MINERALS PO Take 1 tablet by mouth daily.   potassium chloride SA 20 MEQ tablet Commonly known as: KLOR-CON Take 20 mEq by mouth daily.   sennosides-docusate sodium 8.6-50 MG tablet Commonly known as: SENOKOT-S Take 2 tablets by mouth 2 (two) times daily as needed for constipation.   sodium chloride 0.45 % solution Inject 75 mLs into the vein See admin instructions. 75 mL/hourly   Vitamin D (Ergocalciferol) 1.25 MG (50000 UT) Caps capsule Commonly known as: DRISDOL Take 50,000 Units by mouth every 7 (seven) days.       No orders of the defined types were placed in this encounter.   Immunization History  Administered Date(s) Administered  . Influenza-Unspecified 12/19/2016, 11/26/2017, 11/11/2018  . PPD Test 04/29/2016  . Pneumococcal Conjugate-13 08/06/2016  . Pneumococcal Polysaccharide-23 04/15/2016  . Tdap 03/31/2016    Social History   Tobacco Use  . Smoking status: Never Smoker  . Smokeless tobacco: Never Used  Substance Use Topics  . Alcohol use: Not Currently    Review of Systems   Unable to obtain secondary to premorbid condition; nursing-as per HPI     Vitals:   12/08/18 1536  BP: 110/70  Pulse: 88  Resp: 18  Temp: 98.2 F (36.8 C)   Body mass index is 23.43 kg/m. Physical Exam  GENERAL APPEARANCE: Semi somnolent but groans allowed when touched SKIN: No diaphoresis rash HEENT: Unremarkable RESPIRATORY: Breathing is even, unlabored. Lung sounds are clear   CARDIOVASCULAR: Heart RRR no murmurs, rubs or gallops. No peripheral edema  GASTROINTESTINAL: Abdomen is soft, non-tender, not distended w/ normal bowel sounds.  GENITOURINARY: Bladder non tender, not distended  MUSCULOSKELETAL: No abnormal joints or musculature NEUROLOGIC: Cranial nerves 2-12 grossly intact. Moves all extremities PSYCHIATRIC: Patient cries out in pain when touched or moved  Patient Active Problem List   Diagnosis Date Noted  . Metastatic cancer ([place])  12/06/2018  . Dementia ([place]) 11/27/2018  . Seizures ([place]) 11/27/2018  . UTI due to extended-spectrum beta lactamase (ESBL) producing Escherichia coli 11/22/2018  . FTT (failure to thrive) in adult 11/22/2018  . Sepsis ([place]) 11/22/2018  . Dehydration 11/22/2018  . UTI (urinary tract infection) 11/18/2018  . Grand mal seizure ([place]) 08/16/2018  . Elevated WBC count 08/08/2018  . Abnormal chest x-ray 08/08/2018  . Metastatic cancer to lung of unknown cell type ([place]) 08/08/2018  . Hypernatremia 05/26/2018  . HCAP (healthcare-associated pneumonia) 05/18/2018  . Acute renal failure (ARF) ([place]) 05/07/2018  . MRSA (methicillin resistant Staphylococcus aureus) septicemia ([place]) 02/23/2018  . Hypokalemia 02/23/2018  . Bile duct stone   . E. coli sepsis ([place]) 02/17/2018  . Cholecystitis 07/31/2017  . Cholecystitis, acute with cholelithiasis 07/20/2017  . Pressure injury of skin 07/20/2017  . History of total right hip arthroplasty 01/05/2017  . Acute blood loss as cause of postoperative anemia 01/05/2017  . Escherichia coli urinary tract infection 01/05/2017  . Acute  encephalopathy 01/05/2017  . Depression, recurrent ([place]) 01/05/2017  . Polyneuropathy 01/05/2017  . Closed right hip fracture ([place]) 12/25/2016  . Anxiety 11/03/2016  . Dysphagia 07/13/2016  . Anasarca 05/21/2016  . Obesity (BMI 30.0-34.9) 05/21/2016  . Closed TBI (traumatic brain injury) ([place]) 04/21/2016  . Status post insertion of percutaneous endoscopic gastrostomy (PEG) tube ([place]) 04/21/2016  . Facial laceration 04/21/2016  . Cheek wound, right, subsequent encounter 04/21/2016  . Urinary tract infection due to extended-spectrum beta lactamase (ESBL) producing Escherichia coli 04/21/2016  . Hypotension 04/21/2016  . Gout   . Hypertension     CMP     Component Value Date/Time   NA 147 11/23/2018   NA 145 07/28/2017   K 4.4 11/23/2018   K 3.7 07/28/2017   CL 106 11/22/2018 0318   CO2 29 11/22/2018 0318   GLUCOSE  181 (H) 11/22/2018 0318   BUN 43 (A) 11/23/2018   CREATININE 0.9 11/23/2018   CREATININE 0.89 11/22/2018 0318   CREATININE 0.39 07/28/2017   CALCIUM 7.7 (L) 11/22/2018 0318   CALCIUM 8.1 07/28/2017   PROT 6.3 (L) 11/17/2018 2004   ALBUMIN 2.5 (L) 11/17/2018 2004   AST 25 11/17/2018 2004   ALT 11 11/17/2018 2004   ALKPHOS 140 (H) 11/17/2018 2004   BILITOT 0.8 11/17/2018 2004   GFRNONAA >60 11/22/2018 0318   GFRNONAA >90 07/28/2017   GFRAA >60 11/22/2018 0318   Recent Labs    02/16/18 2227  11/20/18 0532 11/21/18 0548 11/22/18 0318 11/23/18  NA  --    < > 149* 147* 145 147  K  --    < > 4.6 3.5 3.2* 4.4  CL  --    < > 117* 111 106  --   CO2  --    < > 15* 24 29  --   GLUCOSE  --    < > 75 146* 181*  --   BUN  --    < > 54* 49* 45* 43*  CREATININE  --    < > 0.97 0.83 0.89 0.9  CALCIUM  --    < > 8.5* 8.1* 7.7*  --   MG 2.1  --   --   --   --   --    < > = values in this interval not displayed.   Recent Labs    02/21/18 0325 08/04/18 1358 09/18/18 11/17/18 2004  AST 30 17 14 25   ALT 33 12 5* 11  ALKPHOS 324* 82 90 140*  BILITOT 1.1 0.7  --  0.8  PROT 4.8* 7.2  --  6.3*  ALBUMIN 1.9* 2.9*  --  2.5*   Recent Labs    11/13/18 11/17/18 2004  11/20/18 0532 11/21/18 0017 11/21/18 0548 11/23/18  WBC 15.2  18.7 18.0*   < > 16.7* 17.1* 17.5* 18.6  NEUTROABS 12  15 14.1*  --   --   --   --  15  HGB 12.0  12.9 13.3   < > 11.5* 11.4* 11.6* 11.4*  HCT 37  40 42.5   < > 39.8 38.8 38.5 35*  MCV  --  93.8   < > 98.3 97.5 93.9  --   PLT 305  372 369   < > 341 353 286 258   < > = values in this interval not displayed.   No results for input(s): CHOL, LDLCALC, TRIG in the last 8760 hours.  Invalid input(s): HCL No results found for: [facility] Lab Results  Component Value Date   TSH 1.68 08/06/2013   No results found for: HGBA1C No results found for: CHOL, HDL, LDLCALC, LDLDIRECT, TRIG, CHOLHDL  Significant Diagnostic Results in last 30 days:  Dg Chest 1 View   Result Date: 11/17/2018 CLINICAL DATA:  Decreased urine output, history of metastatic carcinoma to lung EXAM: CHEST  1 VIEW COMPARISON:  08/04/2018 FINDINGS: Innumerable bilateral pulmonary nodules consistent with metastatic disease, progressed since 08/04/2018 comparison. No pleural effusion. Normal heart size. Tortuous arch with atherosclerosis. Possible right hilar node. No pneumothorax. Chronic deformity of the right humerus. Post mastectomy changes on the right. IMPRESSION: Innumerable pulmonary nodules consistent with metastatic disease, progressed as compared with prior radiograph from June 2020. Possible right hilar adenopathy. Electronically Signed   By: Donavan Foil M.D.   On: 11/17/2018 20:19   Dg Knee 2 Views Left  Result Date: 11/17/2018 CLINICAL DATA:  Left thigh pain EXAM: LEFT KNEE - 1-2 VIEW COMPARISON:  None FINDINGS: Bones appear osteopenic. No gross fracture or malalignment. Advanced tricompartment arthritis of the knee with probable trace effusion. Diffuse soft tissue swelling. Vascular calcifications. IMPRESSION: Osteopenia. Advanced tricompartment arthritis without gross evidence for acute fracture Electronically Signed   By: Donavan Foil M.D.   On: 11/17/2018 20:14   Dg Abd 1 View  Result Date: 11/17/2018 CLINICAL DATA:  Left greater than right hip pain EXAM: ABDOMEN - 1 VIEW COMPARISON:  10/18/2017 FINDINGS: Patient is rotated. Surgical clips in the right upper quadrant. Nonobstructed bowel-gas pattern with impacted feces at the rectum. Chronic fracture deformity of the proximal left femur. IMPRESSION: Nonobstructed bowel-gas pattern with impacted feces at the rectum Electronically Signed   By: Donavan Foil M.D.   On: 11/17/2018 20:12   Dg Hip Unilat With Pelvis 2-3 Views Left  Result Date: 11/17/2018 CLINICAL DATA:  Left greater than right hip pain EXAM: DG HIP (WITH OR WITHOUT PELVIS) 2-3V LEFT COMPARISON:  10/18/2017 FINDINGS: Limited by patient positioning. Pubic  bones are obscured by rectal stool. Intramedullary rod within the right femur with chronic fracture deformity. Chronic fracture deformity at left femoral neck with progressed bony resorptive changes at the femoral neck and trochanter. Superior and lateral displacement of the residual distal femur with respect to the hip joint. Vascular calcifications. Smooth cortex thickening or periosteal reaction without change. IMPRESSION: 1. Chronic fracture deformity of the left femoral neck with progressed bony resorptive changes at the femoral neck and trochanter, now with superior and lateral displacement of the residual femoral shaft with respect to the hip joint. 2. Surgical hardware right hip with chronic fracture deformity Electronically Signed   By: Donavan Foil M.D.   On: 11/17/2018 20:16   [place] Ekg Site Rite  Result Date: 11/21/2018 If Site Rite image not attached, placement could not be confirmed due to current cardiac rhythm.  [place] Ekg Site Rite  Result Date: 11/20/2018 If Site Rite image not attached, placement could not be confirmed due to current cardiac rhythm.   Assessment and Plan  Dying care-started patient on morphine solution 5 mg every 4 scheduled.  I spoke with patient's son and let him know that his mother was put on morphine and will probably be dying in the next few days and he understands.     Time spent greater than 35 minutes Autumn Duos, MD

## 2018-12-13 ENCOUNTER — Encounter: Payer: Self-pay | Admitting: Internal Medicine

## 2019-01-05 DEATH — deceased

## 2019-11-27 IMAGING — CR DG CHEST 1V
1 series · 1 of 1 positions shown · non-contrast
Comparison: 08/04/2018

CLINICAL DATA: Decreased urine output, history of metastatic
carcinoma to lung

EXAM:
CHEST  1 VIEW

[x chest ap]
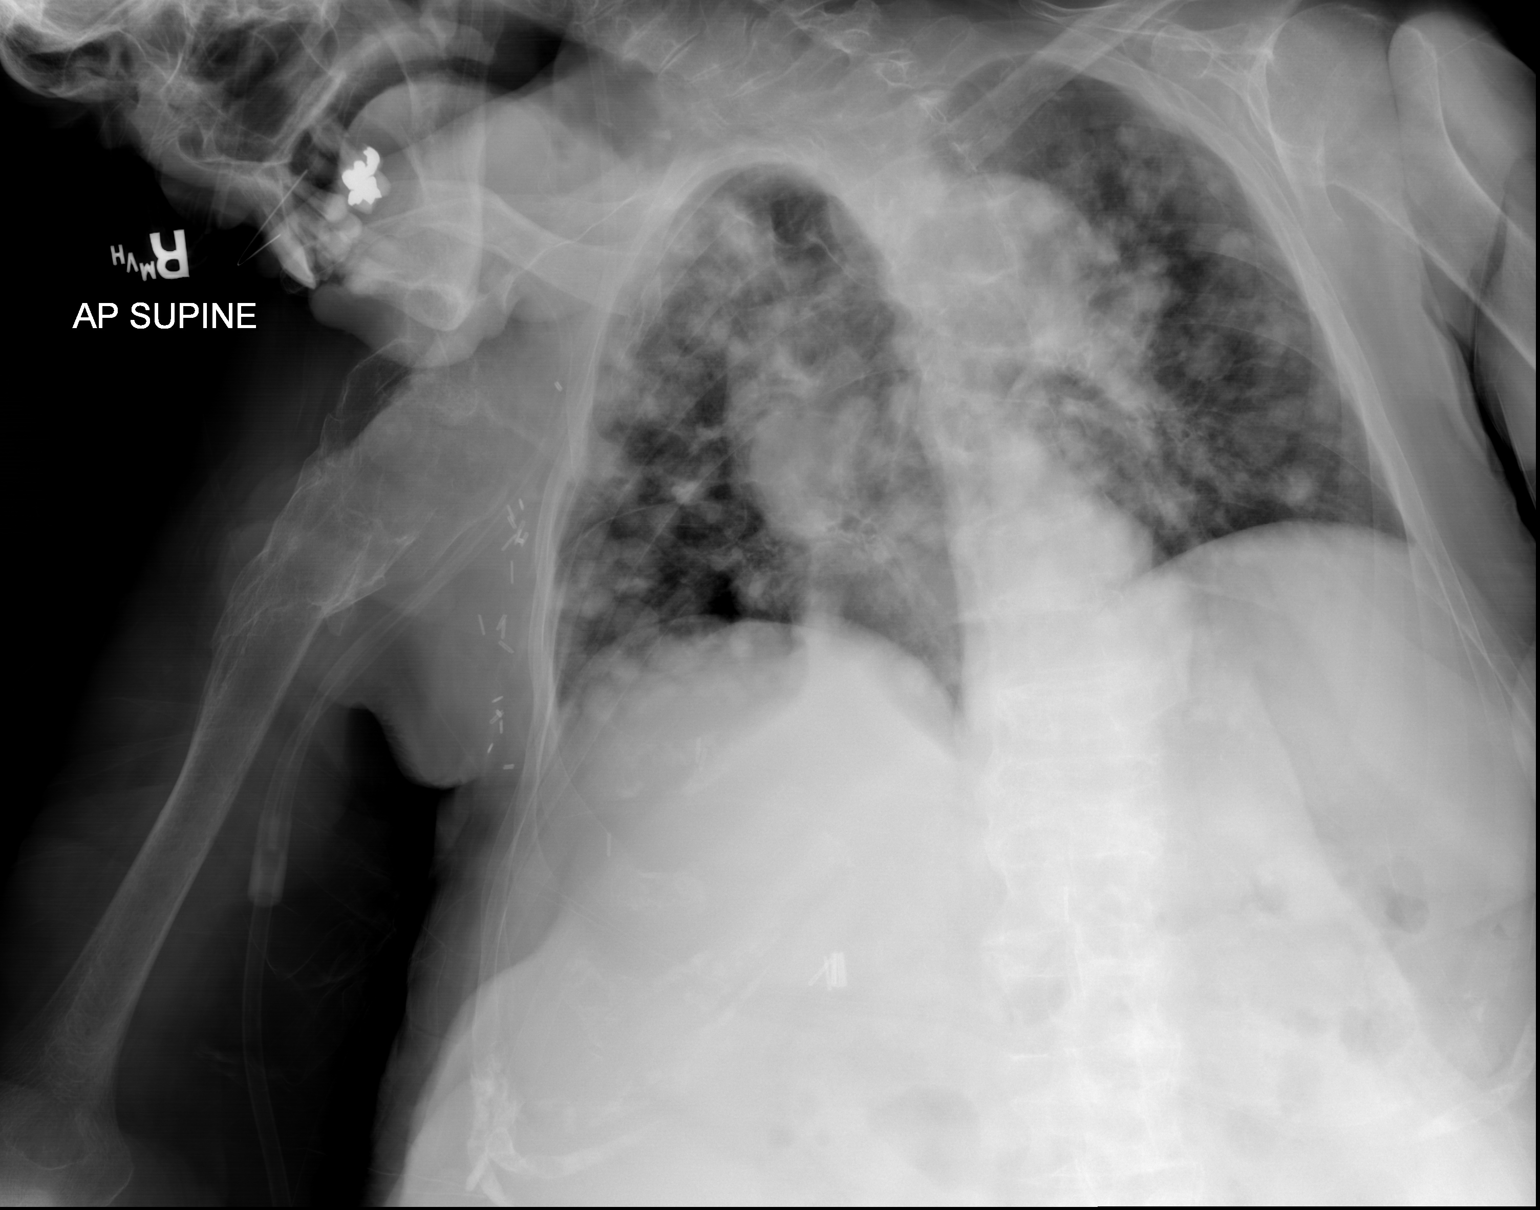

[1 of 1 positions shown; findings below may reference images not displayed]

FINDINGS: Innumerable bilateral pulmonary nodules consistent with metastatic
disease, progressed since 08/04/2018 comparison. No pleural
effusion. Normal heart size. Tortuous arch with atherosclerosis.
Possible right hilar node. No pneumothorax. Chronic deformity of the
right humerus. Post mastectomy changes on the right.
IMPRESSION: Innumerable pulmonary nodules consistent with metastatic disease,
progressed as compared with prior radiograph from July 2018.
Possible right hilar adenopathy.

## 2019-11-27 IMAGING — CR DG HIP (WITH OR WITHOUT PELVIS) 2-3V*L*
3 series · 3 of 3 positions shown · non-contrast
Comparison: 10/18/2017

CLINICAL DATA: Left greater than right hip pain

EXAM:
DG HIP (WITH OR WITHOUT PELVIS) 2-3V LEFT

[x pelvis]
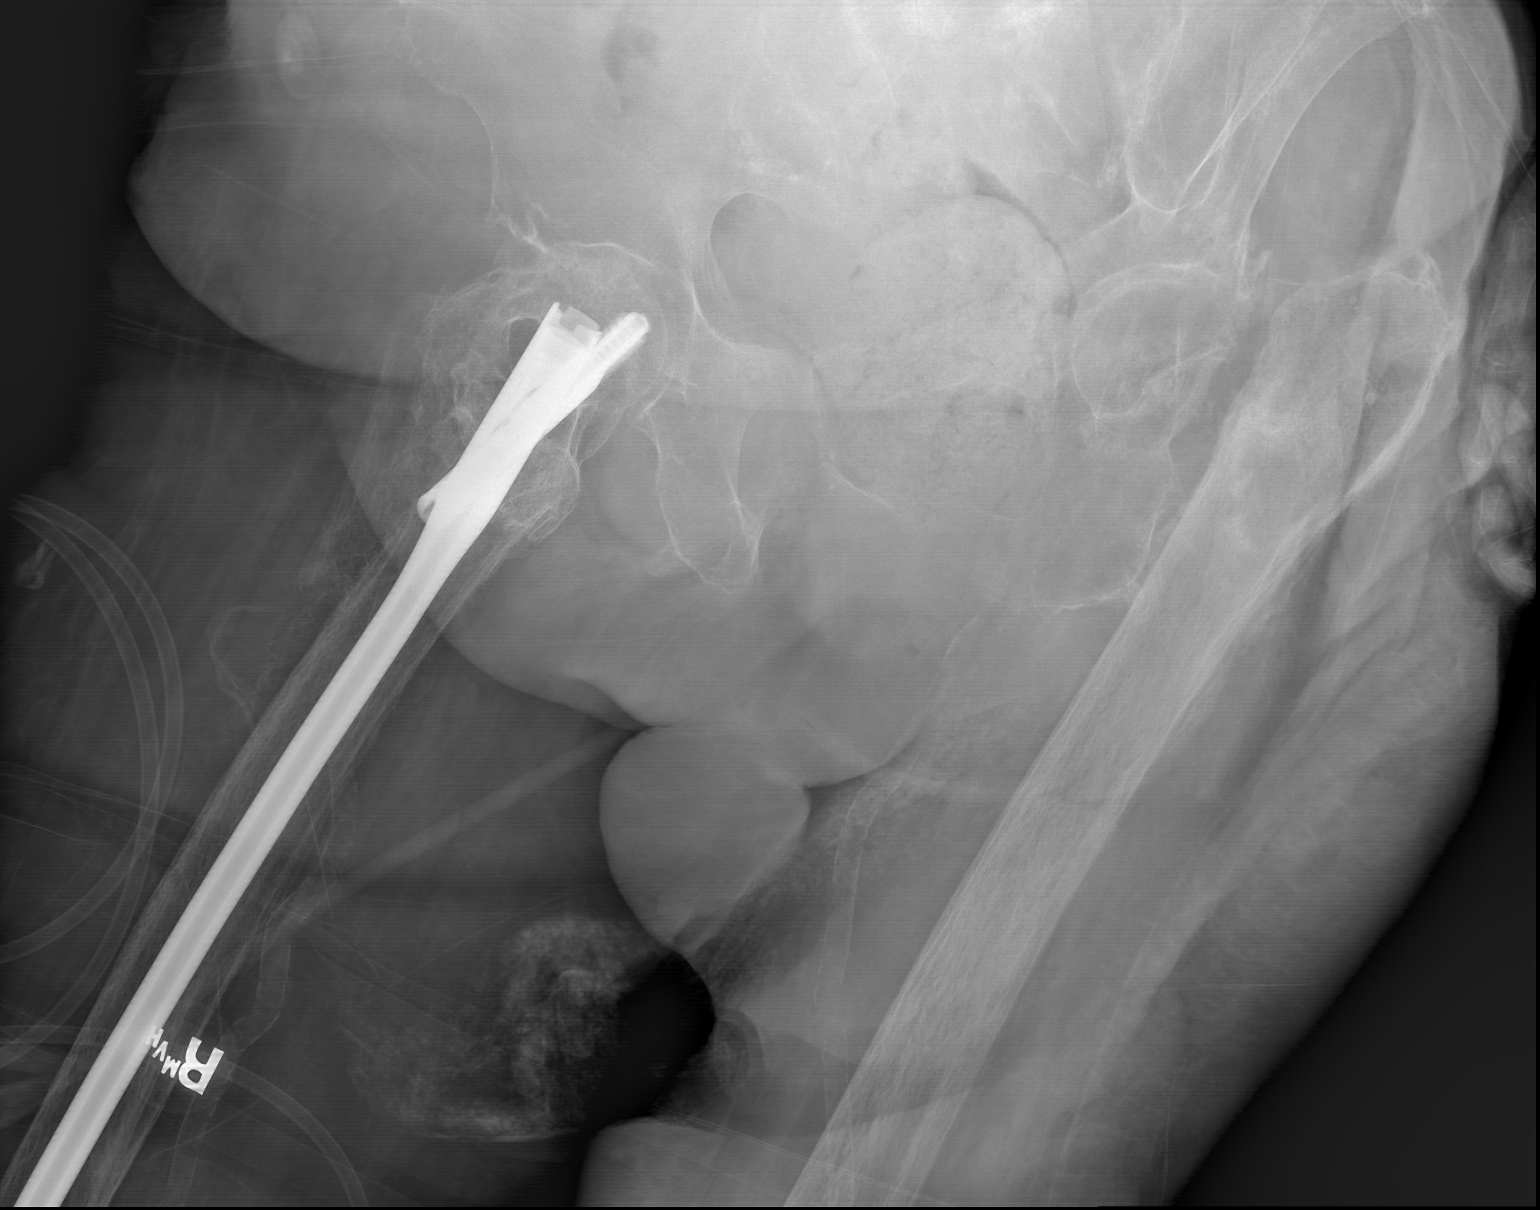

[x hip lat left]
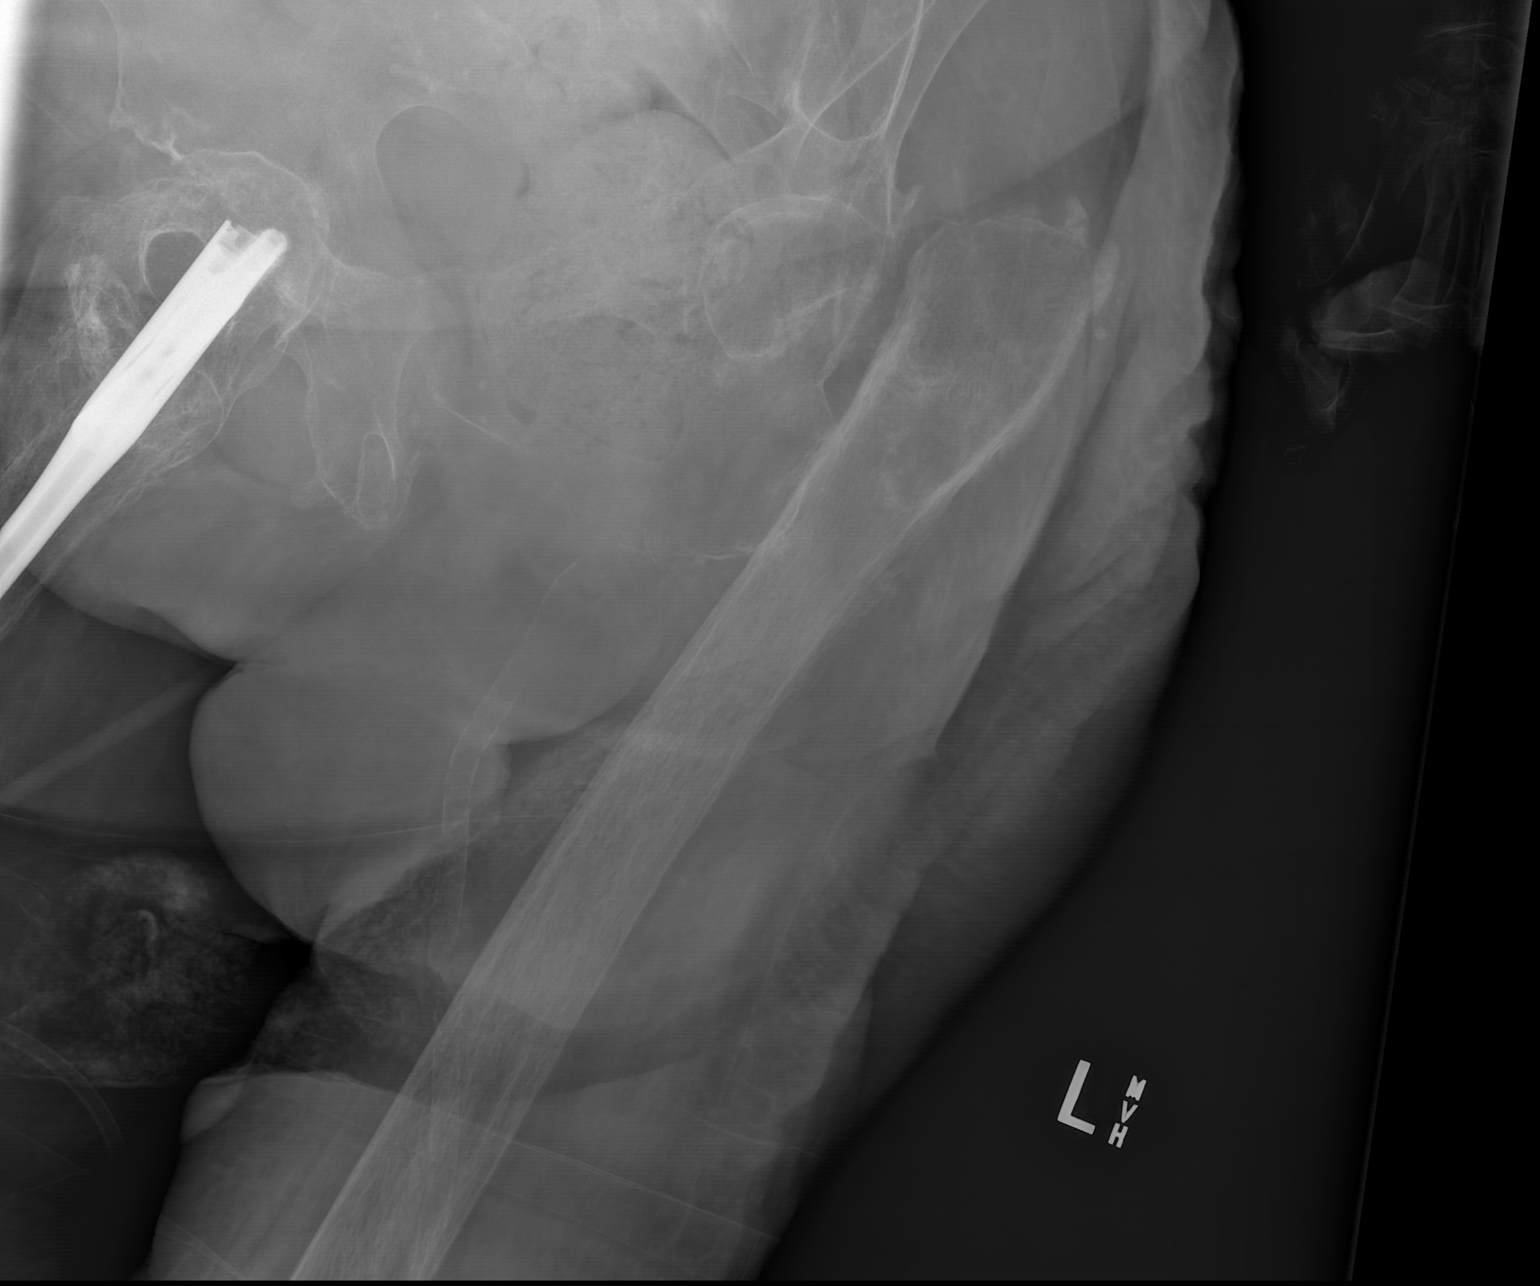

[w hip lat left]
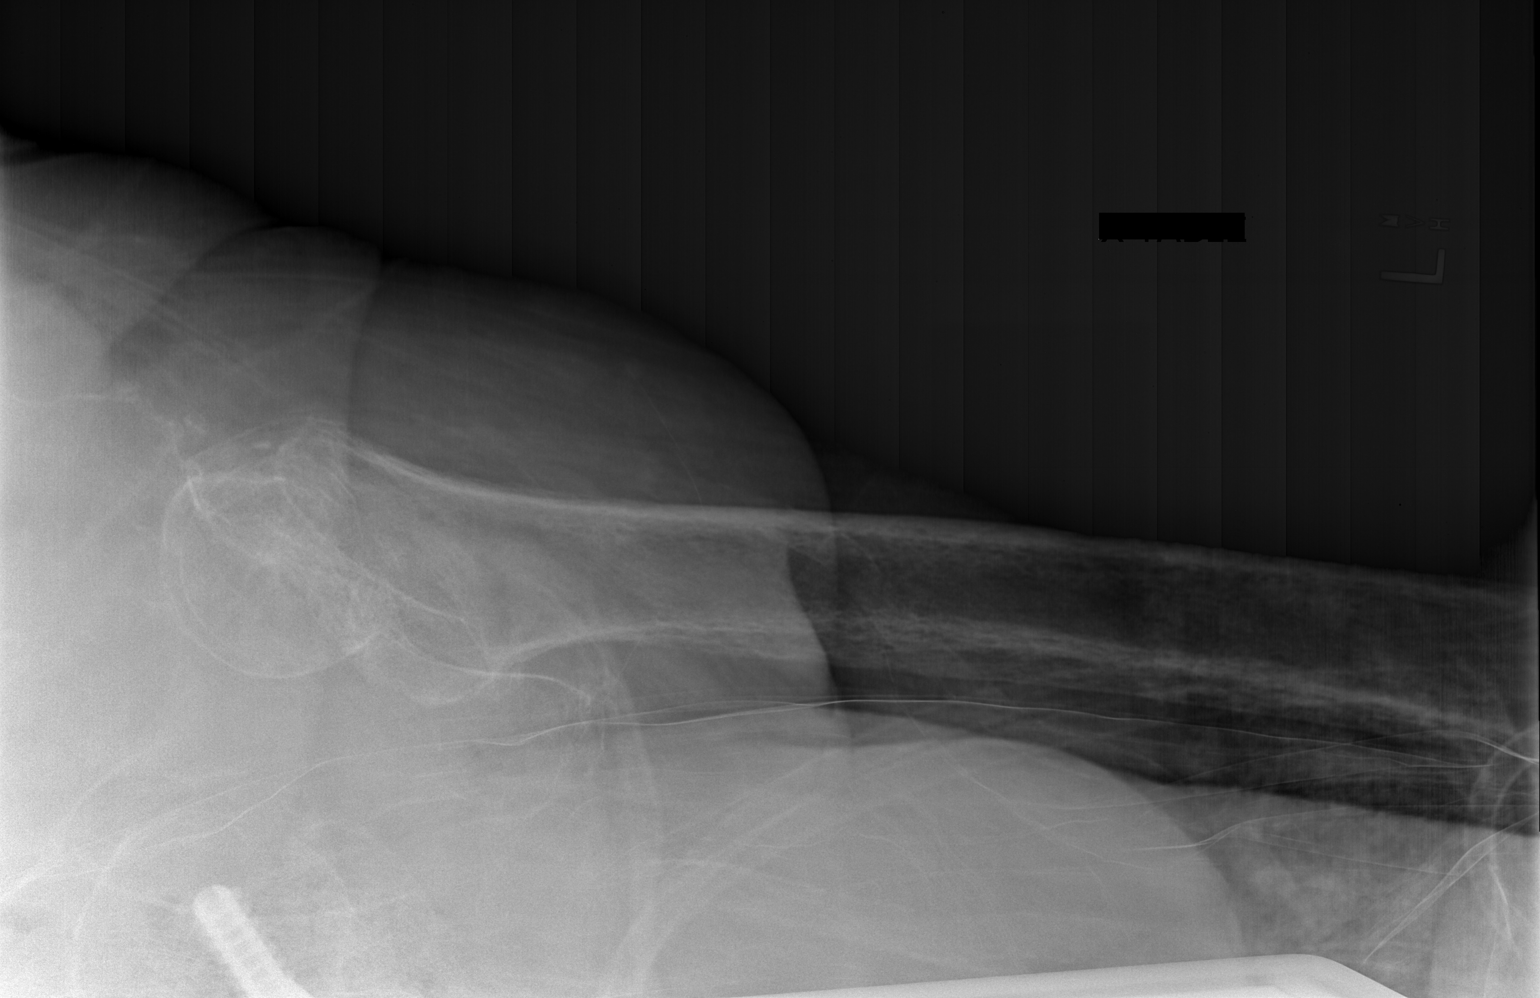

[3 of 3 positions shown; findings below may reference images not displayed]

FINDINGS: Limited by patient positioning. Pubic bones are obscured by rectal
stool. Intramedullary rod within the right femur with chronic
fracture deformity. Chronic fracture deformity at left femoral neck
with progressed bony resorptive changes at the femoral neck and
trochanter. Superior and lateral displacement of the residual distal
femur with respect to the hip joint. Vascular calcifications. Smooth
cortex thickening or periosteal reaction without change.
IMPRESSION: 1. Chronic fracture deformity of the left femoral neck with
progressed bony resorptive changes at the femoral neck and
trochanter, now with superior and lateral displacement of the
residual femoral shaft with respect to the hip joint.
2. Surgical hardware right hip with chronic fracture deformity

## 2019-11-27 IMAGING — CR DG KNEE 1-2V*L*
2 series · 2 of 2 positions shown · non-contrast
Comparison: None

CLINICAL DATA: Left thigh pain

EXAM:
LEFT KNEE - 1-2 VIEW

[x knee ap left (1 of 2)]
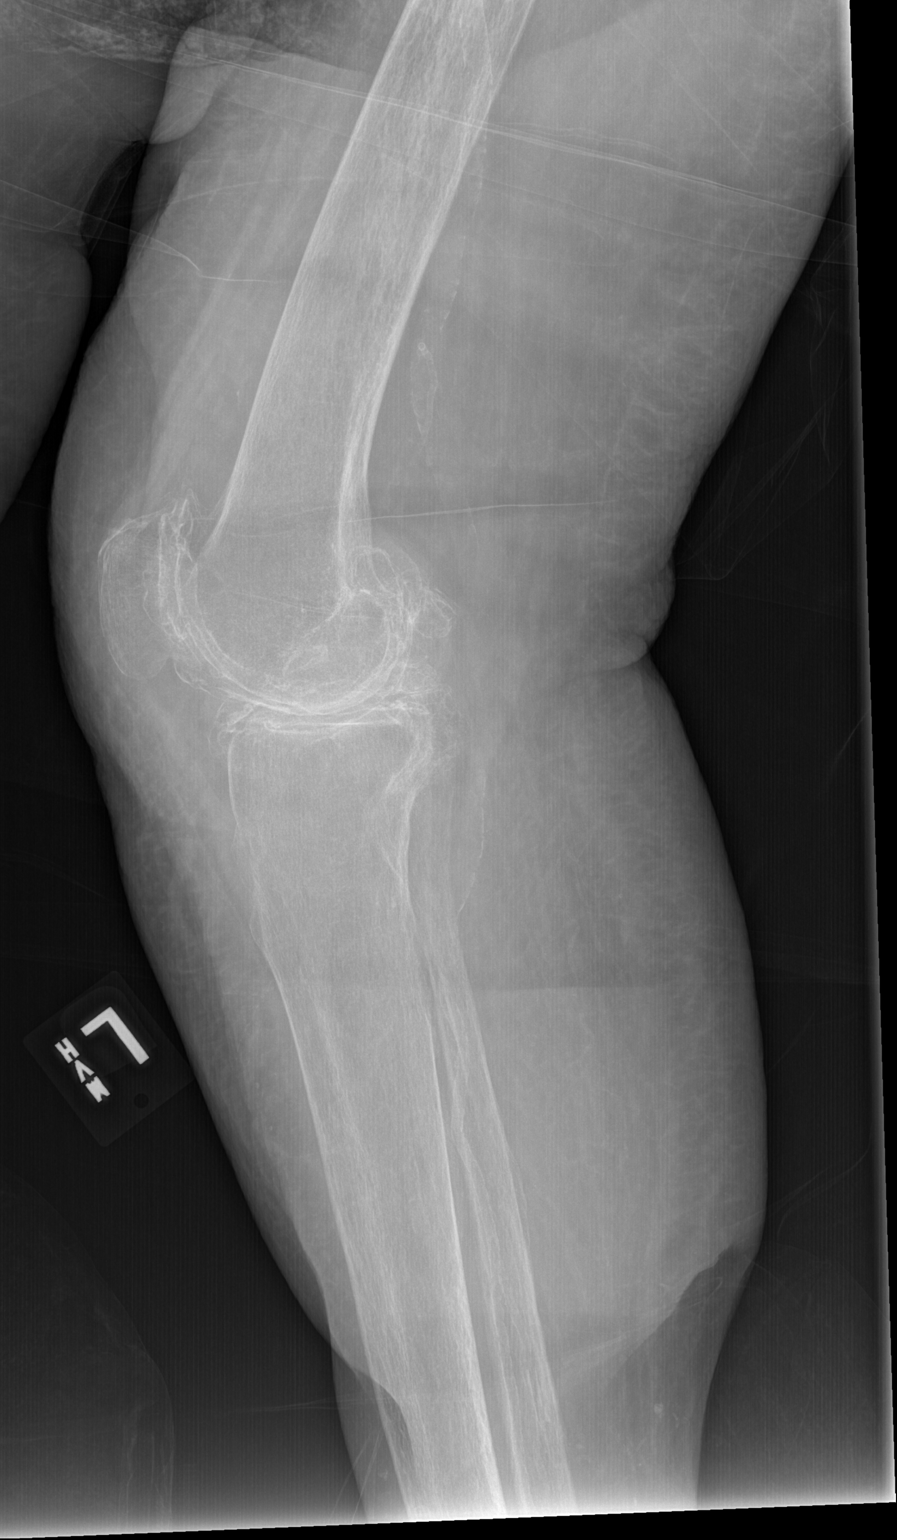

[x knee ap left (2 of 2)]
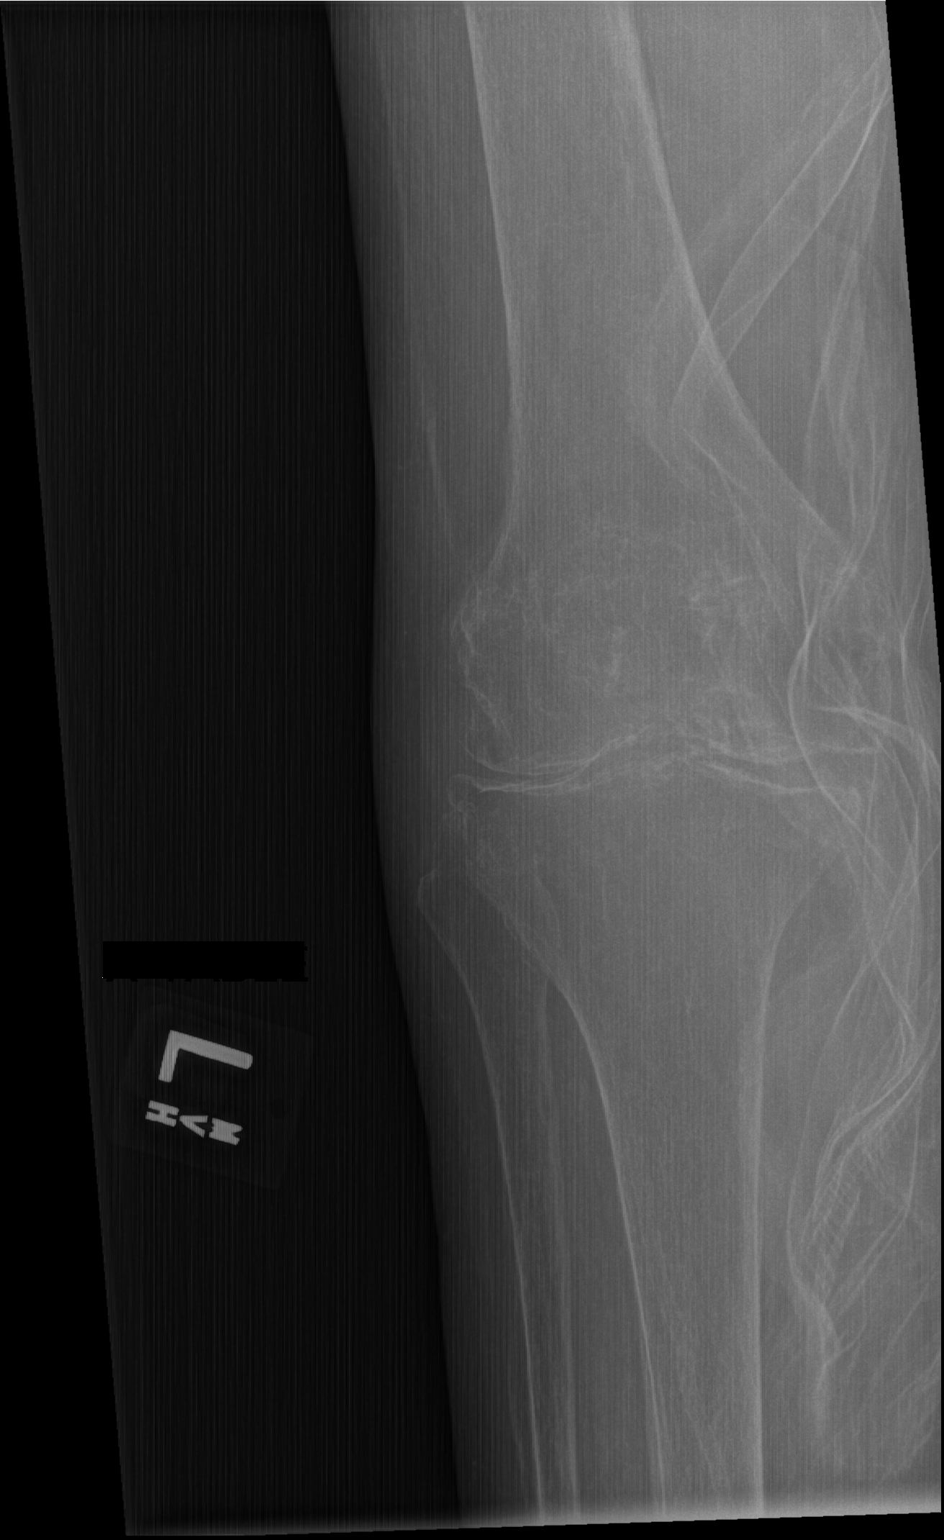

[2 of 2 positions shown; findings below may reference images not displayed]

FINDINGS: Bones appear osteopenic. No gross fracture or malalignment. Advanced
tricompartment arthritis of the knee with probable trace effusion.
Diffuse soft tissue swelling. Vascular calcifications.
IMPRESSION: Osteopenia. Advanced tricompartment arthritis without gross evidence
for acute fracture
